# Patient Record
Sex: Female | Born: 1960 | Race: White | Hispanic: No | Marital: Single | Smoking: Never smoker
Health system: Southern US, Community
[De-identification: ages and names within clinical notes are randomized; demographics above are authoritative.]

## PROBLEM LIST (undated history)

## (undated) DIAGNOSIS — N838 Other noninflammatory disorders of ovary, fallopian tube and broad ligament: Secondary | ICD-10-CM

## (undated) DIAGNOSIS — K219 Gastro-esophageal reflux disease without esophagitis: Secondary | ICD-10-CM

## (undated) DIAGNOSIS — Z862 Personal history of diseases of the blood and blood-forming organs and certain disorders involving the immune mechanism: Secondary | ICD-10-CM

## (undated) DIAGNOSIS — H7191 Unspecified cholesteatoma, right ear: Secondary | ICD-10-CM

## (undated) DIAGNOSIS — C801 Malignant (primary) neoplasm, unspecified: Secondary | ICD-10-CM

## (undated) DIAGNOSIS — G8929 Other chronic pain: Secondary | ICD-10-CM

## (undated) DIAGNOSIS — Z974 Presence of external hearing-aid: Secondary | ICD-10-CM

## (undated) DIAGNOSIS — IMO0002 Reserved for concepts with insufficient information to code with codable children: Secondary | ICD-10-CM

## (undated) DIAGNOSIS — K56609 Unspecified intestinal obstruction, unspecified as to partial versus complete obstruction: Secondary | ICD-10-CM

## (undated) DIAGNOSIS — G610 Guillain-Barre syndrome: Secondary | ICD-10-CM

## (undated) DIAGNOSIS — R112 Nausea with vomiting, unspecified: Secondary | ICD-10-CM

## (undated) DIAGNOSIS — M1990 Unspecified osteoarthritis, unspecified site: Secondary | ICD-10-CM

## (undated) DIAGNOSIS — R0789 Other chest pain: Secondary | ICD-10-CM

## (undated) DIAGNOSIS — D693 Immune thrombocytopenic purpura: Secondary | ICD-10-CM

## (undated) DIAGNOSIS — IMO0001 Reserved for inherently not codable concepts without codable children: Secondary | ICD-10-CM

## (undated) DIAGNOSIS — E785 Hyperlipidemia, unspecified: Secondary | ICD-10-CM

## (undated) DIAGNOSIS — R51 Headache: Secondary | ICD-10-CM

## (undated) DIAGNOSIS — Z9889 Other specified postprocedural states: Secondary | ICD-10-CM

## (undated) DIAGNOSIS — R519 Headache, unspecified: Secondary | ICD-10-CM

## (undated) HISTORY — DX: Unspecified osteoarthritis, unspecified site: M19.90

## (undated) HISTORY — DX: Reserved for inherently not codable concepts without codable children: IMO0001

## (undated) HISTORY — DX: Personal history of diseases of the blood and blood-forming organs and certain disorders involving the immune mechanism: Z86.2

## (undated) HISTORY — DX: Guillain-Barre syndrome: G61.0

## (undated) HISTORY — DX: Headache: R51

## (undated) HISTORY — PX: WISDOM TOOTH EXTRACTION: SHX21

## (undated) HISTORY — PX: KNEE ARTHROSCOPY: SUR90

## (undated) HISTORY — DX: Immune thrombocytopenic purpura: D69.3

## (undated) HISTORY — DX: Other chest pain: R07.89

## (undated) HISTORY — DX: Headache, unspecified: R51.9

## (undated) HISTORY — PX: EXTERNAL EAR SURGERY: SHX627

## (undated) HISTORY — DX: Gastro-esophageal reflux disease without esophagitis: K21.9

## (undated) HISTORY — PX: KNEE LIGAMENT RECONSTRUCTION: SHX1895

## (undated) HISTORY — DX: Unspecified intestinal obstruction, unspecified as to partial versus complete obstruction: K56.609

## (undated) HISTORY — DX: Hyperlipidemia, unspecified: E78.5

## (undated) HISTORY — DX: Presence of external hearing-aid: Z97.4

## (undated) HISTORY — DX: Other chronic pain: G89.29

## (undated) HISTORY — DX: Malignant (primary) neoplasm, unspecified: C80.1

## (undated) HISTORY — DX: Reserved for concepts with insufficient information to code with codable children: IMO0002

## (undated) HISTORY — DX: Unspecified cholesteatoma, right ear: H71.91

---
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
## 2002-11-20 ENCOUNTER — Other Ambulatory Visit: Admission: RE | Admit: 2002-11-20 | Discharge: 2002-11-20 | Payer: Self-pay | Admitting: Obstetrics and Gynecology

## 2005-12-02 ENCOUNTER — Ambulatory Visit: Payer: Self-pay | Admitting: Oncology

## 2006-06-03 ENCOUNTER — Ambulatory Visit: Payer: Self-pay | Admitting: Oncology

## 2007-02-06 ENCOUNTER — Ambulatory Visit (HOSPITAL_BASED_OUTPATIENT_CLINIC_OR_DEPARTMENT_OTHER): Admission: RE | Admit: 2007-02-06 | Discharge: 2007-02-06 | Payer: Self-pay | Admitting: Otolaryngology

## 2007-05-26 ENCOUNTER — Ambulatory Visit: Payer: Self-pay | Admitting: Oncology

## 2008-10-10 ENCOUNTER — Ambulatory Visit (HOSPITAL_COMMUNITY): Admission: RE | Admit: 2008-10-10 | Discharge: 2008-10-10 | Payer: Self-pay | Admitting: Gastroenterology

## 2008-12-13 HISTORY — PX: EXTERNAL EAR SURGERY: SHX627

## 2008-12-13 HISTORY — PX: CHOLECYSTECTOMY: SHX55

## 2008-12-20 ENCOUNTER — Ambulatory Visit (HOSPITAL_COMMUNITY): Admission: RE | Admit: 2008-12-20 | Discharge: 2008-12-20 | Payer: Self-pay | Admitting: Otolaryngology

## 2009-01-06 ENCOUNTER — Ambulatory Visit (HOSPITAL_COMMUNITY): Admission: RE | Admit: 2009-01-06 | Discharge: 2009-01-06 | Payer: Self-pay | Admitting: Otolaryngology

## 2009-01-22 ENCOUNTER — Encounter (INDEPENDENT_AMBULATORY_CARE_PROVIDER_SITE_OTHER): Payer: Self-pay | Admitting: Otolaryngology

## 2009-01-22 ENCOUNTER — Ambulatory Visit (HOSPITAL_COMMUNITY): Admission: RE | Admit: 2009-01-22 | Discharge: 2009-01-23 | Payer: Self-pay | Admitting: Otolaryngology

## 2009-02-18 ENCOUNTER — Emergency Department (HOSPITAL_COMMUNITY): Admission: EM | Admit: 2009-02-18 | Discharge: 2009-02-19 | Payer: Self-pay | Admitting: Emergency Medicine

## 2009-04-14 ENCOUNTER — Ambulatory Visit: Payer: Self-pay | Admitting: Oncology

## 2009-04-23 ENCOUNTER — Ambulatory Visit (HOSPITAL_BASED_OUTPATIENT_CLINIC_OR_DEPARTMENT_OTHER): Admission: RE | Admit: 2009-04-23 | Discharge: 2009-04-23 | Payer: Self-pay | Admitting: Otolaryngology

## 2009-04-24 LAB — CBC WITH DIFFERENTIAL/PLATELET
BASO%: 0.7 % (ref 0.0–2.0)
Eosinophils Absolute: 0.1 10*3/uL (ref 0.0–0.5)
LYMPH%: 29.1 % (ref 14.0–49.7)
MCHC: 33.8 g/dL (ref 31.5–36.0)
MONO#: 0.3 10*3/uL (ref 0.1–0.9)
MONO%: 5.3 % (ref 0.0–14.0)
NEUT#: 3.6 10*3/uL (ref 1.5–6.5)
RBC: 4.03 10*6/uL (ref 3.70–5.45)
RDW: 13.1 % (ref 11.2–14.5)
WBC: 5.6 10*3/uL (ref 3.9–10.3)
nRBC: 0 % (ref 0–0)

## 2009-06-02 ENCOUNTER — Encounter (INDEPENDENT_AMBULATORY_CARE_PROVIDER_SITE_OTHER): Payer: Self-pay | Admitting: General Surgery

## 2009-06-03 ENCOUNTER — Inpatient Hospital Stay (HOSPITAL_COMMUNITY): Admission: RE | Admit: 2009-06-03 | Discharge: 2009-06-05 | Payer: Self-pay | Admitting: General Surgery

## 2009-10-21 ENCOUNTER — Ambulatory Visit: Payer: Self-pay | Admitting: Oncology

## 2009-11-18 ENCOUNTER — Emergency Department (HOSPITAL_COMMUNITY): Admission: EM | Admit: 2009-11-18 | Discharge: 2009-11-18 | Payer: Self-pay | Admitting: Emergency Medicine

## 2009-12-26 ENCOUNTER — Ambulatory Visit (HOSPITAL_COMMUNITY): Admission: RE | Admit: 2009-12-26 | Discharge: 2009-12-26 | Payer: Self-pay | Admitting: Obstetrics and Gynecology

## 2010-10-09 ENCOUNTER — Ambulatory Visit (HOSPITAL_COMMUNITY): Admission: RE | Admit: 2010-10-09 | Discharge: 2010-10-09 | Payer: Self-pay | Admitting: Otolaryngology

## 2010-11-13 ENCOUNTER — Emergency Department (HOSPITAL_COMMUNITY)
Admission: EM | Admit: 2010-11-13 | Discharge: 2010-11-13 | Payer: Self-pay | Source: Home / Self Care | Admitting: Emergency Medicine

## 2010-12-31 ENCOUNTER — Other Ambulatory Visit (HOSPITAL_COMMUNITY): Payer: Self-pay | Admitting: Obstetrics and Gynecology

## 2010-12-31 DIAGNOSIS — Z0000 Encounter for general adult medical examination without abnormal findings: Secondary | ICD-10-CM

## 2010-12-31 DIAGNOSIS — Z1231 Encounter for screening mammogram for malignant neoplasm of breast: Secondary | ICD-10-CM

## 2011-01-03 ENCOUNTER — Encounter: Payer: Self-pay | Admitting: Otolaryngology

## 2011-02-05 ENCOUNTER — Ambulatory Visit (HOSPITAL_COMMUNITY)
Admission: RE | Admit: 2011-02-05 | Discharge: 2011-02-05 | Disposition: A | Discharge status: Home / Self Care | Payer: 59 | Source: Ambulatory Visit | Attending: Obstetrics and Gynecology | Admitting: Obstetrics and Gynecology

## 2011-02-05 DIAGNOSIS — Z1231 Encounter for screening mammogram for malignant neoplasm of breast: Secondary | ICD-10-CM | POA: Insufficient documentation

## 2011-02-23 LAB — BASIC METABOLIC PANEL
CO2: 25 mEq/L (ref 19–32)
Calcium: 9.3 mg/dL (ref 8.4–10.5)
GFR calc Af Amer: >60 mL/min (ref >60)
GFR calc non Af Amer: >60 mL/min (ref >60)
Potassium: 4.1 mEq/L (ref 3.5–5.1)
Sodium: 146 mEq/L — ABNORMAL HIGH (ref 135–145)

## 2011-02-23 LAB — DIFFERENTIAL
Basophils Absolute: 0 10*3/uL (ref 0.0–0.1)
Basophils Relative: 0 % (ref 0–1)
Eosinophils Absolute: 0 10*3/uL (ref 0.0–0.7)
Lymphocytes Relative: 15 % (ref 12–46)
Monocytes Relative: 1 % — ABNORMAL LOW (ref 3–12)
Neutrophils Relative %: 84 % — ABNORMAL HIGH (ref 43–77)

## 2011-02-23 LAB — CBC
HCT: 37.8 % (ref 36.0–46.0)
Hemoglobin: 13.3 g/dL (ref 12.0–15.0)
MCHC: 35.2 g/dL (ref 30.0–36.0)
RBC: 4.17 MIL/uL (ref 3.87–5.11)
WBC: 5.4 10*3/uL (ref 4.0–10.5)

## 2011-03-22 LAB — CBC
HCT: 30 % — ABNORMAL LOW (ref 36.0–46.0)
HCT: 31.2 % — ABNORMAL LOW (ref 36.0–46.0)
HCT: 39.1 % (ref 36.0–46.0)
Hemoglobin: 10.4 g/dL — ABNORMAL LOW (ref 12.0–15.0)
Hemoglobin: 13.5 g/dL (ref 12.0–15.0)
MCHC: 33.5 g/dL (ref 30.0–36.0)
MCHC: 33.7 g/dL (ref 30.0–36.0)
MCV: 96.6 fL (ref 78.0–100.0)
MCV: 96.8 fL (ref 78.0–100.0)
Platelets: 71 10*3/uL — ABNORMAL LOW (ref 150–400)
RBC: 3.31 MIL/uL — ABNORMAL LOW (ref 3.87–5.11)
RBC: 4.14 MIL/uL (ref 3.87–5.11)
RDW: 13.2 % (ref 11.5–15.5)
RDW: 13.4 % (ref 11.5–15.5)
RDW: 13.5 % (ref 11.5–15.5)
WBC: 4.3 10*3/uL (ref 4.0–10.5)

## 2011-03-22 LAB — COMPREHENSIVE METABOLIC PANEL
ALT: 162 U/L — ABNORMAL HIGH (ref 0–35)
AST: 176 U/L — ABNORMAL HIGH (ref 0–37)
Albumin: 3 g/dL — ABNORMAL LOW (ref 3.5–5.2)
Alkaline Phosphatase: 36 U/L — ABNORMAL LOW (ref 39–117)
Alkaline Phosphatase: 38 U/L — ABNORMAL LOW (ref 39–117)
Alkaline Phosphatase: 43 U/L (ref 39–117)
BUN: 1 mg/dL — ABNORMAL LOW (ref 6–23)
BUN: 8 mg/dL (ref 6–23)
CO2: 29 mEq/L (ref 19–32)
Chloride: 103 mEq/L (ref 96–112)
Chloride: 106 mEq/L (ref 96–112)
GFR calc non Af Amer: >60 mL/min (ref >60)
Glucose, Bld: 107 mg/dL — ABNORMAL HIGH (ref 70–99)
Glucose, Bld: 112 mg/dL — ABNORMAL HIGH (ref 70–99)
Glucose, Bld: 96 mg/dL (ref 70–99)
Potassium: 3.6 mEq/L (ref 3.5–5.1)
Potassium: 4.1 mEq/L (ref 3.5–5.1)
Potassium: 4.6 mEq/L (ref 3.5–5.1)
Sodium: 135 mEq/L (ref 135–145)
Total Bilirubin: 0.5 mg/dL (ref 0.3–1.2)
Total Bilirubin: 0.7 mg/dL (ref 0.3–1.2)
Total Protein: 5.2 g/dL — ABNORMAL LOW (ref 6.0–8.3)

## 2011-03-22 LAB — DIFFERENTIAL
Basophils Absolute: 0 10*3/uL (ref 0.0–0.1)
Eosinophils Absolute: 0.1 10*3/uL (ref 0.0–0.7)
Monocytes Absolute: 0.3 10*3/uL (ref 0.1–1.0)
Neutrophils Relative %: 47 % (ref 43–77)

## 2011-03-22 LAB — HEPATIC FUNCTION PANEL
ALT: 92 U/L — ABNORMAL HIGH (ref 0–35)
Bilirubin, Direct: 0.1 mg/dL (ref 0.0–0.3)
Indirect Bilirubin: 0.4 mg/dL (ref 0.3–0.9)

## 2011-03-23 LAB — POCT HEMOGLOBIN-HEMACUE: Hemoglobin: 12.3 g/dL (ref 12.0–15.0)

## 2011-03-25 LAB — URINALYSIS, ROUTINE W REFLEX MICROSCOPIC
Bilirubin Urine: NEGATIVE
Ketones, ur: NEGATIVE mg/dL
Nitrite: NEGATIVE
Urobilinogen, UA: 1 mg/dL (ref 0.0–1.0)

## 2011-03-25 LAB — POCT PREGNANCY, URINE: Preg Test, Ur: NEGATIVE

## 2011-03-25 LAB — CBC
HCT: 33.8 % — ABNORMAL LOW (ref 36.0–46.0)
MCHC: 34.4 g/dL (ref 30.0–36.0)
MCV: 95 fL (ref 78.0–100.0)
Platelets: 83 10*3/uL — ABNORMAL LOW (ref 150–400)
RBC: 3.56 MIL/uL — ABNORMAL LOW (ref 3.87–5.11)

## 2011-03-25 LAB — DIFFERENTIAL
Basophils Absolute: 0 10*3/uL (ref 0.0–0.1)
Eosinophils Relative: 1 % (ref 0–5)
Lymphocytes Relative: 37 % (ref 12–46)
Lymphs Abs: 1.8 10*3/uL (ref 0.7–4.0)
Neutro Abs: 2.7 10*3/uL (ref 1.7–7.7)

## 2011-03-25 LAB — COMPREHENSIVE METABOLIC PANEL
AST: 15 U/L (ref 0–37)
BUN: 12 mg/dL (ref 6–23)
CO2: 23 mEq/L (ref 19–32)
Calcium: 8.5 mg/dL (ref 8.4–10.5)
Creatinine, Ser: 0.71 mg/dL (ref 0.4–1.2)
GFR calc Af Amer: >60 mL/min (ref >60)
GFR calc non Af Amer: >60 mL/min (ref >60)

## 2011-03-25 LAB — LIPASE, BLOOD: Lipase: 18 U/L (ref 11–59)

## 2011-03-30 LAB — CBC
HCT: 36.1 % (ref 36.0–46.0)
Hemoglobin: 12.3 g/dL (ref 12.0–15.0)
RBC: 3.81 MIL/uL — ABNORMAL LOW (ref 3.87–5.11)
RDW: 13.1 % (ref 11.5–15.5)
WBC: 4.7 10*3/uL (ref 4.0–10.5)

## 2011-03-30 LAB — DIFFERENTIAL
Eosinophils Relative: 2 % (ref 0–5)
Lymphocytes Relative: 46 % (ref 12–46)
Lymphs Abs: 2.2 10*3/uL (ref 0.7–4.0)
Monocytes Absolute: 0.3 10*3/uL (ref 0.1–1.0)
Monocytes Relative: 7 % (ref 3–12)
Neutro Abs: 2.1 10*3/uL (ref 1.7–7.7)

## 2011-03-30 LAB — ABO/RH: ABO/RH(D): A POS

## 2011-03-30 LAB — PROTIME-INR: INR: 0.9 (ref 0.00–1.49)

## 2011-03-30 LAB — APTT: aPTT: 29 seconds (ref 24–37)

## 2011-04-27 NOTE — Op Note (Signed)
NAMESOPHIAMARIE, Brady              ACCOUNT NO.:  [id number]   MEDICAL RECORD NO.:  [id number]          PATIENT TYPE:  OIB   LOCATION:  5155                         FACILITY:  [facility]   PHYSICIAN:  Carolan Shiver, M.D.    DATE OF BIRTH:  02/05/1961   DATE OF PROCEDURE:  DATE OF DISCHARGE:                               OPERATIVE REPORT   JUSTIFICATION FOR PROCEDURE:  Christine Brady is a 50 year old white  female here today for a right canal up tympanomastoidectomy to excise a  right attic cholesteatoma.  Christine Brady was first seen by me on  January 23, 2007.  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  She was placed on Sterapred and underwent revision BMTs with modified  Peggye Pitt T-tubes on February 09, 2007.  She was found to have large  retraction pocket with debris posterosuperiorly and a shallow attic  retraction pocket also with some debris.  At that time, she did not have  any frank cholesteatoma.  By December 02, 2008, she was found to have  attic debris which was felt to be an attic cholesteatoma.  At that time,  she was having bilateral ear pain and decreased hearing.  She was placed  on Cipro HC and Darvocet and return in 10-14 days for followup.  At that  time, I was convinced that she had a right attic cholesteatoma.  CT scan  of her temporal bones was ordered and was consistent with attic  cholesteatoma.   The patient was counseled that she would require a right canal up  tympanomastoidectomy, possible modified radical mastoidectomy.  She has  a known history of idiopathic thrombocytopenic purpura.  I had her  evaluated by her family doctor, Dr. Celine Mans.  Chest x-ray was  within normal limits.  An EKG showed sinus bradycardia, and preoperative  lab work showed initial platelet count of 91,000, repeat platelet  count  was 108,000 on January 20, 2009.  She was scheduled for a 6 packs of  platelet transfusion on the morning of the procedure.  She was  recommended for right canal up tympanomastoidectomy possible, modified  radical mastoidectomy right ear, [facility] OR, January 22, 2009, at  8:30 in the morning under general endotracheal anesthesia.   Risks, complications, and alternatives were explained to her.  Questions  were invited and answered, and informed consent was signed and  witnessed.  Preop audiometric testing on January 20, 2009, documented 40  dB conductive hearing loss with an SRT of 40 dB and 92% discrimination.  She had normal hearing in the left ear.   Justification for outpatient setting is patient's age and need for  general endotracheal anesthesia.   Justification for overnight stay is 23 hours of observation of facial  function status post right canal up tympanomastoidectomy.   PREOPERATIVE DIAGNOSIS:  Right attic cholesteatoma.   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   OPERATION:  Right canal  up tympanomastoidectomy with type 1 medial  fascia graft tympanoplasty, composite cartilage perichondrial autograft,  repair of a large scutal defect, and UMT with Paparella type I tube.   SURGEON:  Carolan Shiver, MD   ANESTHESIA:  General endotracheal, [place], MD   COMPLICATIONS:  None.   SUMMARY OF REPORT:  After the patient was taken to the operating room,  she was placed in the supine position.  An IV had been begun in the  holding area.  General IV induction was then performed.  Again, she had  received 6 packs of platelets in the Short-Stay Unit early this morning.  After induction, the patient was orally intubated without difficulty.  Eyelids were taped shut.  She was properly positioned and monitored.  Elbows and ankles padded with foam rubber, and a Foley catheter was  inserted.  A time-out was  performed.   Small amount of hair was clipped in the right postauricular area.  Her  hair was taped, and stocking cap was applied.  Her right postauricular  area was infiltrated with 3 mL of 1% Xylocaine with 1:100,000  epinephrine.  Silver wire needle and electrodes were then inserted into  the right orbicularis oris and orbicularis oculi muscles and the  suprasternal notch and connected to a NIM facial nerve monitor  preamplifier.   The patient's right ear and hemiface were then prepped with Betadine and  draped in a standard fashion for a tympanomastoidectomy.   Examination of the right ear canal revealed a 5-mm diameter canal.  There was a large attic cholesteatoma and a patent tube with a 50%  inferior perforation.  The ear was filled with debris.  The canal and  tympanic membrane were cleaned.  Four-quadrant ear canal blocks were  performed with 1 mL of 2% Xylocaine with 1:50,000 epinephrine.   A right postauricular incision was marked and tattooed.  An incision was  made and carried down through skin and subcutaneous tissue.  A T-shaped  incision was made in the mastoid periosteum.  The anterior and posterior  subperiosteal flaps were elevated.  The mastoid cortex was exposed.  The  mastoid was then entered using cutting burs using continuous suction  irrigation.  The mastoid was found to be sclerotic.  Sigmoid sinus was  identified and followed to the digastric tip and to the digastric ridge.  Digastric periosteum was identified.  Complete mastoidectomy was  performed.  The sigmoid sinus was anteriorly displaced.  Posterior canal  wall was thin.  There were very few cells in the mastoid proper.  It was  quite sclerotic.  The antrum was opened and followed anteriorly to the  attic.  Attic cholesteatoma was encountered in the aditus ad antrum.  Bone lateral to the ossicles was then removed with diamond burs.  The  posterior canal wall was thinned.  The scutum was completely  absent.  At  this point, I was able to deliver the cholesteatoma out from the attic  retraction pocket.  It was a fragmented cholesteatoma not well  encapsulated.  The disease was engulfing the head of the malleus and the  body of the incus.  The superior aspect of the body of the incus was  eroded, as was the short process.  The cholesteatoma was dissected from  the lateral surface of the ossicles and toward the middle ear.  The  facial recess was then opened, and the incudal buttress was removed in  order to remove all of the disease.  The medial superior canal wall was  cleaned with sinus tympani excavators.  The dome of the lateral  semicircular canal was intact.  The facial nerve was not exposed during  the facial recess portion of the procedure.  The cavity was then  copiously irrigated with bacitracin-containing saline, and bone wax was  used to control bone bleeding which was a slow ooze throughout the  procedure due to her thrombocytopenia.   The posterior canal wall skin was then dissected medially to the  annulus.  Again, the scutum was completely absent.  Middle ear was  entered posterosuperiorly after an incision was made in the post canal  wall skin between 6 and 12 o'clock for the 15 blade.  Examination of the  middle ear revealed that the chorda tympani nerve was coursing directly  through the cholesteatoma, and therefore the nerve was sacrificed.  Cholesteatoma was dissected from the long process of the incus and from  the stapes superstructure including the area of the oval window between  the facial canal and the superior aspect of the stapes superstructure.  It was also dissected off the stapedial tendon and from the sinus  tympani.  Some posterosuperior bony canal wall was curetted in order to  facilitate the excision.  Middle ear mucosa was very thickened and there  was a thin layer of squame covering the mucosa.  Therefore, the mucosa  was removed.  The epithelial  margin of the inferior perforation was then  excised with a 59-10 beaver blade.  Portion of the tympanic membrane was  myringosclerotic.   Careful examination of the ossicles revealed the malleus was intact and  mobile.  The incus was eroded as I mentioned before, but the long  process was intact as was the lenticular process.  Stapes superstructure  was intact and mobile.  The facial nerve was covered by bone in a  horizontal portion and stimulated at 0.7 mA at the termination of the  procedure.  The nerve was not exposed.  The ear was then copiously  irrigated with bacitracin-containing saline.   The temporalis fascia graft was then harvested and pressed between  tongue blades and dried.  A slit was made in the center of the graft to  receive a Paparella type I tube as there was no other place in the  residual tympanic membrane for a tube.   A 1-cm incision was made in the medial surface of the right tragus after  the area was infiltrated with 1 mL of 1% Xylocaine with 1:100,000  epinephrine.  A composite cartilage perichondrial autograft was  harvested.  The perichondrium was removed from one side of the  cartilage.  The cartilage was trimmed to fit the scutal defect.  The  CCPA graft was then placed in the ear with cartilage filling the scutal  defect and a cuff of perichondrium surrounding the defect.  The inferior  tongue of perichondrium was draped down over the short process of the  malleus handle to completely close the scutal defect.   The temporalis fascia graft which had been previously dried and trimmed  was then placed medial to the tympanic membrane remnant.  Anterior  portion of the graft was brought out through the perforation.  The  middle ear was packed with Gelfoam disks soaked in Cipro HC.  The graft  was then rotated 180 degrees anteriorly and tucked for 306 degrees  around the circumference of the perforation.  The Paparella type I tube  was placed  through the  graft.  Posterior mesotympanum was then packed  with Gelfoam disks soaked in Cipro HC.  The atticotomy flap was then  returned to the anatomic position covering the perichondrium of the CCPA  graft as well as the temporalis fascia graft.  The medial canal was then  packed with Gelfoam disks soaked in Cipro HC.  Lateral canal was packed  with quarter-inch Iodoform Nu-Gauze packing impregnated with bacitracin  ointment.  A sliver of Penrose drain was placed in the mastoid cavity.  It was sutured in place with interrupted 3-0 Monocryl.  Postauricular  incision was then closed in 3 layers using interrupted inverted 3-0  Monocryl for mucoperiosteal layer and for subcutaneous layer.  Prior to  closing the subcutaneous layer, the site was copiously irrigated again  with bacitracin-containing saline.  Skin was closed with a running  locking 5-0 Ethilon.  Bacitracin ointment was applied.  The ear was  padded with Telfa and cotton and a standard adult Glasscock mastoid  dressing was applied loosely in the standard fashion.  The patient's  Foley catheter was removed.  She was awakened, extubated, and  transferred to hospital bed.  She appeared to tolerate the general  endotracheal anesthesia and the procedures well and left the operating  room in stable condition.   TOTAL FLUIDS:  3 L.   TOTAL URINE OUTPUT:  2 L.   ESTIMATED BLOOD LOSS:  50 mL.   The patient received Ancef 1 g IV, Zofran 4 mg IV at the beginning and  end of the procedure and Decadron 10 mg IV.   Again, the patient received 6 packs of platelets prior to the procedure.  She had a constant ooze from the bone and did have some minor clotting  during the procedure.   SUMMARY:  The patient was found to have a right attic cholesteatoma of  the fragmented type.  It was covering the head of the malleus and the  lateral body of the incus.  There was complete erosion of the scutum.  There was disease lateral to the ossicles, but it  also extended into the  sinus tympani and it crept along the mucosa in the middle ear.  It was  attached to the long process of the incus as well as the stapes  superstructure and the stapedial tendon.  There was partial erosion of  the body and the short process of the incus.  Otherwise, the ossicles  were intact and mobile.  They had been pushed somewhat medially due to  the large attic cholesteatoma.  Facial nerve was intact and stimulated  at 0.7 mA.  The facial recess was opened.  The chorda tympani was  sacrificed as it was coursing through the cholesteatoma.  She has a 60%  inferior perforation which was medial fascia graft and composite  cartilage perichondrial autograft harvested from the right tragus was  used to graft the scutum.  Middle ear mucosa was removed.  She had a  sclerotic mastoid.  Eustachian tube patency was not tested.  There were  no complications.  No prostheses were inserted.           ______________________________  Carolan Shiver, M.D.     EMK/MEDQ  D:  01/22/2009  T:  01/23/2009  Job:  [id number]   cc:   Celine Mans

## 2011-04-27 NOTE — Discharge Summary (Signed)
Christine Brady, Christine Brady              ACCOUNT NO.:  [id number]   MEDICAL RECORD NO.:  [id number]          PATIENT TYPE:  OIB   LOCATION:  5155                         FACILITY:  [facility]   PHYSICIAN:  Carolan Shiver, M.D.    DATE OF BIRTH:  [birth year]   DATE OF ADMISSION:  01/22/2009  DATE OF DISCHARGE:  01/23/2009                               DISCHARGE SUMMARY   ADMISSION DIAGNOSES:  1. Right attic cholesteatoma with moderate conductive hearing loss,      right ear.  2. Idiopathic thrombocytopenic purpura.  3. Gastroesophageal reflux.  4. History of sinus bradycardia.   DISCHARGE DIAGNOSES:  1. Right attic cholesteatoma with moderate conductive hearing loss,      right ear.  2. Idiopathic thrombocytopenic purpura.  3. Gastroesophageal reflux.  4. History of sinus bradycardia.  5. Right attic and middle ear cholesteatoma.   OPERATION:  Right canal-up tympanomastoidectomy with type 1  tympanoplasty and composite cartilage perichondrial autograft  reconstruction of the right scutum along with Paparella type 1 tube  placement, and microdissection using the OR microscope.   SURGEON:  Carolan Shiver, MD   ANESTHESIA:  General endotracheal by Dr. Laverle Hobby.   COMPLICATIONS:  None.   DISCHARGE STATUS:  Stable.   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  She had had a long history of chronic ear disease and  underwent BMTs in the past for adhesive otitis media.  Over the last  year, she developed a right attic cholesteatoma documented on CT scan.  Preoperative evaluation because of idiopathic thrombocytopenic purpura  revealed that she had sinus bradycardia, a platelet count of 91,000,  which increased to 108,000 by January 20, 2009, unremarkable chest x-ray  and sinus bradycardia on EKG.  She was  evaluated by her family  physician, Dr. Celine Mans, prior to the procedure.   On physical examination, she had a right attic cholesteatoma with a 60%  inferior perforation through which was a modified Richards T-tube.  She  had intact facial function.  Preop audiometric testing documented 40 dB  conductive hearing loss in the right ear with normal discrimination.  She had normal hearing in the left ear.   On the morning of January 22, 2009, the patient was taken to [facility]  Operating Room #3 and underwent an uncomplicated right canal-up  tympanomastoidectomy.  She was found to have attic and middle ear  cholesteatoma.  She had complete revision of her scutum.  The disease  was lateral to the ossicles.  It was filling the attic and it had eroded  the short process of the incus and the portion of the body of the incus  superiorly.  It extended down the long the process of the incus and  involved the superior aspect of stapes superstructure.  It was sitting  on the horizontal portion of the facial canal, but had not eroded the  canal.  It extended into the  right sinus tympani and then had crept in  one layer across the mucosa of the middle ear into the hypotympanum.  During the procedure, the disease was removed from both the mastoid and  transcanal approach.  The facial recess was opened in order to  facilitate removal.  The ossicles were intact and mobile after removal.  Great care was taken to clean the medial surface of the canal wall.  Middle ear mucosa was removed because of the superficial squamous layer  that had crept across the mucosa.  Reconstruction was performed with a  composite cartilage perichondrial  autograft harvested from the right  tragus.  This was used to reconstruct the scutal defect.  A temporalis  fascia graft was used to graft the 70% inferior perforation.  A  Paparella type 1 tube was placed in the graft as there was no other  place in tympanic membrane  for a tube placement.  Again, the ossicles  did not need to be reconstructed.  Prosthesis and cement were not used.  The chorda tympani nerve was sacrificed during the procedure as it was  engulfed by the cholesteatoma.   The patient was recovered in the PACU without complications.  She  complained of some pain.  Throughout the day of January 22, 2009, she  had some intermittent nausea and vomited x3.  This was controlled with  antiemetics.  She was continued on IV hydration through the evening.  Her vital signs stabilized.  By the morning of January 23, 2009, she  was awake, complaining of some mild pain.  The right postauricular drain  was removed.  The incision was healing without signs of hematoma.  I  should mention that the patient received 6 pack of platelets in the  morning of the procedure prior to going to the OR.  She had some chronic  low grade oozing during the procedure, bone oozed but it was not  remarkable.  Again, in the morning of  January 23, 2009, she had intact  facial function and no nystagmus.  Her right ear canal packing was in  place.   Her friend, Rick Duff, was in the room with the patient.  Both the  patient and Fleet Contras were given postoperative instructions.  Cleaning,  keeping her head elevated at 30 degrees, following a soft to regular  diet, avoiding water exposure to the right ear for the next 6 weeks, and  returning to my office on January 29, 2009, at 2:50 p.m. for followup  and suture removal.   DISCHARGE MEDICATIONS:  Include the following:  1. Cefzil 500 mg p.o. b.i.d. x10 days, #20.  2. Percocet 5/325 one to two p.o. q.4 h. p.r.n. pain, #30.  3. Phenergan suppositories 25 mg PR q.6 h. p.r.n. nausea, #2.  4. Cipro HC drops 3 drops, b.i.d. x7 days.   Again, she is not to return to work for the next 10 days.  She is to  follow a regular diet and keep her head elevated.  She is to avoid water  exposure, nose blowing, or sneezing without her  mouth open.  She is to  call [phone] for any postoperative problems directly related to the  procedure.  She was given both verbal and written instructions and  referred to our website at [email or web address] where the  postoperative instructions are also posted.   ADMISSION LABORATORY DATA:  White blood cell count of 4700, hemoglobin  of 12.3, hematocrit 36.1, platelet count of 108,000.  PT of 12.4,  PTT  29, INR 0.9.  Again, these were from January 20, 2009.  EKG showed sinus  bradycardia.  Chest x-ray showed no acute cardiopulmonary abnormalities.  Audiometric testing showed a 40 dB conductive hearing loss in the right  ear with normal discrimination.  CT scanning of her temporal bone has  documented chronic right mastoiditis and the right attic cholesteatoma.   At the time of hospitalization, the patient was in the short-stay unit,  where she received 6 pack of platelets; [place] OR room #3, the PACU,  and then on 5000 room 5127.   Discharge status was stable.           ______________________________  Carolan Shiver, M.D.     EMK/MEDQ  D:  01/23/2009  T:  01/23/2009  Job:  [id number]   cc:   Celine Mans

## 2011-04-27 NOTE — Op Note (Signed)
Christine Brady, Christine Brady              ACCOUNT NO.:  [id number]   MEDICAL RECORD NO.:  [id number]          PATIENT TYPE:  OIB   LOCATION:  0098                         FACILITY:  [facility]   PHYSICIAN:  Adolph Pollack, M.D.DATE OF BIRTH:  [birth year]   DATE OF PROCEDURE:  06/02/2009  DATE OF DISCHARGE:                               OPERATIVE REPORT   PREOPERATIVE DIAGNOSIS:  Symptomatic cholelithiasis (microlithiasis).   POSTOPERATIVE DIAGNOSIS:  Symptomatic cholelithiasis (microlithiasis).   PROCEDURE:  Laparoscopic cholecystectomy with intraoperative  cholangiogram.   SURGEON:  Adolph Pollack, M.D.   ANESTHESIA:  General.   INDICATIONS:  Christine Brady is a 50 year old female who has been having  problems with nausea and vomiting for at least a year and a half.  Recently this past year, she has developed some postprandial right upper  quadrant pressure-type pain consistent with biliary colic.  A GI  cocktail helped with her nausea and vomiting but not with her  postprandial right upper quadrant pain.  She had ultrasound done in  September 2009, demonstrating gallbladder sludge, potentially  microlithiasis.  HIDA scan was normal.  Upper endoscopy was normal.  We  had a long discussion about cholecystectomy.  Following that a fair  number people do get relief but not all and she understands this.  She  now presents for elective cholecystectomy.  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   TECHNIQUE:  She is brought to have room, placed supine on the operating  table and general anesthetic was administered.  The abdominal wall was  sterilely prepped and draped.  Marcaine was infiltrated the subumbilical  region.  A subumbilical incision was made through the skin, subcutaneous  tissue, fascia and peritoneum entering the peritoneal cavity under  direct vision.  A pursestring suture of 0 Vicryl was placed around the  fascial edges.  A Hasson trocar was introduced into the peritoneal  cavity and pneumoperitoneum was created by insufflation of CO2 gas.   Next, the laparoscope was introduced and there is no underlying bleeding  or organ injury.  She is placed in reverse Trendelenburg position with  the right side tilted slightly up.  An 11-mm trocar was placed through  an epigastric incision and two 5 mm trocars placed in the right upper  quadrant.  The fundus of the gallbladder was grasped and no acute  inflammatory changes were noted.  It was retracted toward the right  shoulder.  The infundibulum was grasped and retracted laterally.  Using  careful blunt dissection on the infundibulum, I mobilized infundibulum.  I then was able to identify the cystic duct and create a window around  it.  The critical view was achieved.  I identified the cystic artery and  created a window around it and clipped it.  I then put a clip at the  cystic duct gallbladder junction.  I made a small incision in the cystic  duct and noted efflux of bile.  A cholangiocath was passed into  abdominal wall, placed into the cystic duct and a cholangiograms  performed.   Under real time fluoroscopy, dilute contrast was injected to the cystic  duct.  Common hepatic, right and left hepatic, and common bile ducts  were all filled and contrast drained promptly into the duodenum without  obvious evidence of obstruction.  Final report is pending the  radiologist's interpretation.   The cholangiocatheter was removed, the cystic duct was clipped three  times on the biliary side and divided.  The cystic artery was then  divided.  The gallbladder then dissected free from liver using  electrocautery intact and placed in Endopouch bag.   I then copiously irrigated out the gallbladder fossa and no bleeding was  noted from this.  No bile leak was noted.  The irrigation fluid was  evacuated.   The gallbladder was removed through the  subumbilical incision and the  subumbilical fascial defect was closed under laparoscopic vision by  tightening up and tying down the pursestring suture.  The remaining  trocars were removed and pneumoperitoneum released.  The skin incisions  were closed with 4-0 Monocryl subcuticular stitches followed by Steri-  Strips and sterile dressings.  She tolerated the procedure well without  any apparent complications and was taken to recovery in satisfactory  condition.      Adolph Pollack, M.D.  Electronically Signed     TJR/MEDQ  D:  06/02/2009  T:  06/02/2009  Job:  [id number]   cc:   Anselmo Rod, M.D.  Fax: [phone]   Celine Mans  Fax: [phone]   Lynann Bologna  Fax: [phone]   Leighton Roach. Truett Perna, M.D.  Fax: [phone]

## 2011-04-27 NOTE — H&P (Signed)
NAMETANISE, RUSSMAN              ACCOUNT NO.:  [id number]   MEDICAL RECORD NO.:  [id number]          PATIENT TYPE:  OIB   LOCATION:  5155                         FACILITY:  [facility]   PHYSICIAN:  Carolan Shiver, M.D.    DATE OF BIRTH:  21-Aug-1961   DATE OF ADMISSION:  01/22/2009  DATE OF DISCHARGE:                              HISTORY & PHYSICAL   CHIEF COMPLAINT:  Right ear pain and fullness.   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  She was first seen by me on January 23, 2007, at which time, she had acute otitis media, right ear with  middle ear ventilating disorder and a middle ear effusion.  She also had  a chronic middle ear effusion on the left.  She underwent revision BMTs  with modified Richards T-tube by myself on February 06, 2007.  At that  time, she had atelectatic tympanic membranes with a large retraction  pocket and debris in the right ear.  An early cholesteatoma was  suspected that cannot be documented.  She returned on December 02, 2008,  at this time, she had a frank right attic cholesteatoma.  She had patent  T tubes at that time.  A CT scan of her temporal bones documented the  cholesteatoma.  She had a workup by her family physician, Dr. Celine Mans because of a history of thrombocytopenic and chronic GE reflux.  She was cleared for general anesthetic.  The chest x-ray revealed no  active disease and an EKG showed sinus bradycardia.   The patient was counseled that she would require a right canal  tympanomastoidectomy, possible modified radical mastoidectomy to remove  the disease.  Risks, complications, and alternatives were explained to  her.  Questions were invited and answered and informed consent was  signed and witnessed.   Preop audiometric testing on January 20, 2009, documented 40 dB  conductive hearing loss  in the right ear with a speech reception  threshold of 40 dB and 92% discrimination.  She had normal hearing in  the left ear.   Preoperative laboratory data documented platelet count of 91,000, which  had increased to 108,000, by January 20, 2009.  She was recommended for  a 6-pack of platelet transfusion in the morning prior to the procedure.  The procedure was scheduled for the morning of January 22, 2009, at  [facility] OR at 8:30 a.m. under general endotracheal anesthesia, Dr.  Diamantina Monks, anesthesiologist.   PAST MEDICAL HISTORY:  Serious illnesses include thrombocytopenia.  Operations include both knees had been operated on.   MEDICATIONS:  Darvocet, Tylenol, MiraLax, and Prilosec.   ALLERGIES TO MEDICATIONS:  CODEINE.   FAMILY HISTORY:  Positive for diabetes, bleeding disorders, hearing  loss, and ITP.   SOCIAL HISTORY:  She is single, works as a Water quality scientist at [employer].  Does drink alcohol 1-6-pack of beer per week.  Does not use  tobacco products.   REVIEW  OF SYSTEMS:  Positive for chronic ear disease, GE reflux,  migraine headaches, and ITP.   PHYSICAL EXAMINATION:  VITAL SIGNS:  Stable.  GENERAL:  She is awake, alert, and coherent, spontaneous and logical.  HEENT:  Facial function is intact.  There is no nystagmus.  Pupils  PERLA.  External ears were stable.  Right external canal was filled with  debris.  There was a deep attic retraction pocket with scutal erosion  and a right attic cholesteatoma.  There was debris covering the tympanic  membrane.  She had a patent T-tube with a 60% inferior dry perforation.  She had a patent left T-tube and no attic retraction pocket.  External  and internal nasal exams were within normal limits.  Oral cavity, lips,  tongue, and palate were normal.  NECK:  Negative supple.  No cervical lymphadenopathy or thyromegaly.  CHEST:  Clear.  HEART:  Sinus bradycardia.  ABDOMEN:  Benign.  PELVIC/RECTAL:  Deferred.   EXTREMITIES:  Within normal limits.  NEUROLOGIC:  Physiologic with the exception of a hearing loss in the  right ear.   DIAGNOSTICS:  Audiometric testing:  The patient has 40 dB conductive  hearing loss in right ear with an SRT of 40 dB and 92% discrimination.  She had intact normal hearing in the left ear with an SRT of 15 dB and  96% discrimination.  She did have a very mild high frequency loss in the  left ear, which was not clinically significant.   CT scan of her temporal bones documented disease in the right attic and  mastoid consistent with an attic cholesteatoma.  Chest x-ray showed no  active disease.  EKG showed sinus bradycardia.   ADMISSION LABORATORY DATA:  Documented the following:  Hemoglobin was  12.3, hematocrit 36.1, white blood cell count 4700, platelet count of  108,000.  PT was 12.4, PTT 29, INR 0.9.  Again, the patient received a 6  pack of platelets in the morning of January 22, 2009, in preparation  for the OR.   IMPRESSION:  1. 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  2. Idiopathic thrombocytopenic purpura.  3. Sinus bradycardia.  4. History of gastroesophageal reflux.   RECOMMENDATIONS:  The patient is recommended for a right canal  tympanomastoidectomy, possible modified radical mastoidectomy.  She  understands that she may require reconstruction of her ossicles with  titanium prosthesis and/or glass-ionomeric cement.  Risks,  complications, and alternatives were explained to her, which included  but were not limited to infection, bleeding, reaction to anesthesia,  facial weakness or paralysis, transient or prolonged dizziness,  transient or prolonged tinnitus, transient or prolonged taste  disturbance, partial or total loss of hearing, and need for additional  procedures in the future if the cholesteatoma should recur.  She  understood the risks, complications, and alternatives and elected to  proceed with the  procedure.   She was scheduled for right canal tympanomastoidectomy, possible  modified radical mastoidectomy on the morning of January 22, 2009, 8:30  a.m., [facility] OR, room #3, Dr. Diamantina Monks, anesthesiologist.          ______________________________  Carolan Shiver, M.D.    EMK/MEDQ  D:  01/22/2009  T:  01/23/2009  Job:  [id number]   cc:   Celine Mans

## 2011-04-27 NOTE — Op Note (Signed)
NAMEBROOKLEY, SPITLER              ACCOUNT NO.:  [id number]   MEDICAL RECORD NO.:  [id number]          PATIENT TYPE:  AMB   LOCATION:  DSC                          FACILITY:  [facility]   PHYSICIAN:  Carolan Shiver, M.D.    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  The patient had  previously undergone a right canal tympanomastoidectomy with excision of  right attic cholesteatoma by myself at [facility] on January 22, 2009.  At that time, a large perforation was grafted and a Paparella  type 1 tube was placed through the graft.  Over the next several months,  the patient has too became completely obstructed by crust and debris and  she was complaining of significant right ear pain radiating into her  right neck.  I attempted to have her soften the crust and debris with  drops, but this did not work and because of the patient's  sensitivity  of her right ear, she was recommended for removal of the tube under  general mask anesthesia and replacement with a new tube.  I discussed  placing a Triune tube with the patient.  Risks, complications, and  alternatives were explained to her.  Questions were invited and  answered.  Informed consent was signed and witnessed.  Preop hearing  testing on March 31, 2009, documented 40 dB mixed hearing loss in the  right ear with 96% discrimination.  She had normal hearing in the left  ear.   JUSTIFICATION FOR OUTPATIENT SETTING:  The patient's age and need for  general mask anesthesia.   JUSTIFICATION FOR OVERNIGHT STAY:  Not applicable.   PREOPERATIVE DIAGNOSES:  1. Obstructed right Paparella type 1 tube, status post right canal      tympanomastoidectomy for  excision of attic cholesteatoma, January 22, 2009.  2. History of idiopathic thrombocytopenic purpura.  3. History of sinus bradycardia.  4. History of gastroesophageal reflux.   POSTOPERATIVE DIAGNOSES:  1. Obstructed right Paparella type 1 tube, status post right canal      tympanomastoidectomy for excision of attic cholesteatoma, January 22, 2009.  2. History of idiopathic thrombocytopenic purpura.  3. History of sinus bradycardia.  4. History of gastroesophageal reflux.   OPERATION:  Removal and replacement of right Paparella type 1 tube.   SURGEON:  Carolan Shiver, MD   ANESTHESIA:  General mask, Dr. Jairo Ben.   COMPLICATIONS:  None.   DISCHARGE STATUS:  Stable.   SUMMARY OF REPORT:  After the patient was taken to the operating room,  she was placed in supine position.  She was then anesthetized with  propofol under the guidance of Dr. Jairo Ben.  The patient was  then masked.  She was not intubated.  After being properly positioned  and  monitored, Elbows and ankles were padded with foam rubber and a time-  out was performed.   The patient's right ear canal was then cleaned of cerumen and debris.  The previously placed Paparella type 1 tube was engulfed in crusted  debris filling the entire medial ear canal.  The crust and debris were  dissected away from the canal wall in the tympanic membrane and the  crust along with the tube was removed.  I then discovered that the tube  was sitting on the lateral surface of the tympanic membrane and was  actually not in the middle ear space.  She had a tube of tubular band of  scar tissue from 2 o'clock to 7 o'clock crossing across the midportion  of her malleus handle.  This tubular scar was excised with Bellucci  scissors and the cut edges were cauterized with a small flake of silver  nitrate after bleeding could not be controlled with 1:100,000 adrenaline  cotton pledget.  There was basically no middle  ear space available for  tube.  I performed an inferior radial myringotomy incision at 6 o'clock  and using a day hook, I was able to create a middle ear space for  Paparella type 1 tube.  Tube was then inserted and Ciprodex drops were  insufflated.  The patient was then awakened and transferred to her  hospital bed.  She appeared to tolerate the general mask anesthesia and  the procedure well and left the operating room in stable condition.   Christine Brady will be discharged today as an outpatient.  She will be  instructed to return to my office on May 26, 2009, at 4:20 p.m.   DISCHARGE MEDICATIONS:  Ciprodex drops 3 drops both ears t.i.d. x7 days.  She will also be given a prescription for Percocet 5/325, #30 one to two  p.o. q.4 h p.r.n. pain.   She is to keep her head elevated, avoid aspirin or aspirin products, and  avoid water exposure to right ear.  She is to call [phone] for any  postoperative problems directly related to the procedure.  She will be  given both verbal and written instructions.      Carolan Shiver, M.D.  Electronically Signed     EMK/MEDQ  D:  04/23/2009  T:  04/23/2009  Job:  [id number]

## 2011-04-30 NOTE — Discharge Summary (Signed)
Christine Brady, Christine Brady              ACCOUNT NO.:  [id number]   MEDICAL RECORD NO.:  [id number]          PATIENT TYPE:  INP   LOCATION:  1612                         [facility]   PHYSICIAN:  Adolph Pollack, M.D.DATE OF BIRTH:  Apr 12, 1961   DATE OF ADMISSION:  06/02/2009  DATE OF DISCHARGE:  06/05/2009                               DISCHARGE SUMMARY   DISCHARGE DIAGNOSIS:  1. Chronic cholecystitis.   SECONDARY DIAGNOSES:  1. Idiopathic thrombocytopenic purpura.  2. Migraine headaches.  3. Gastroesophageal reflux.  4. Transient transaminitis.  5. Acute blood loss anemia.  6. Urinary retention.   PROCEDURE:  Laparoscopic cholecystectomy with intraoperative  cholangiogram on 06/02/2009.   REASON FOR ADMISSION:  This is a 50 year old female who had a long  history of problems with nausea and vomiting.  She then began having  pressure-type right upper quadrant pain, made worse by meals, associated  nausea and vomiting.  She had a thorough evaluation.  Ultrasound  demonstrated gallbladder sludge.  Upper endoscopy was normal.  It is  felt that she may have microlithiasis that is causing her symptoms and  now she was admitted for an elective cholecystectomy.   HOSPITAL COURSE:  She underwent an elective cholecystectomy and the  first postoperative day was remarkable for acute urinary retention.  She  was tolerating some liquids.  She had a significant amount of  discomfort.  By the second postoperative day, she is having which she  felt was more pain in the epigastrium and right upper quadrant, with  some nausea and vomiting with the headache.  She was afebrile.  Vital  signs were normal.  She had good urine output.  Her white blood cell  count the evening before was normal at 4300, with a hemoglobin down at  10.3.  Her SGOT and SGPT were somewhat elevated but a total bilirubin  and alk phos were normal.  I subsequently ordered a HIDA scan to rule  out a bile leak and this  was negative.  Empiric ciprofloxacin was  ordered.  Her SGOT and SGPT continued to rise into her second  postoperative day.  She was also having increasing headache which may  have been related to the morphine.  The abdominal pain was improving.  She continued to have the Foley in.  A platelet count did drop to 56,000  but her hemoglobin was stable at 10.7.  I did a CT scan of the abdomen  which  demonstrated bibasilar atelectasis, some free pelvic fluid, but  no evidence of intestinal injury.  Her third postoperative day revealed  her SGOT and SGPT began  decreasing.  She was feeling better overall and  was improved.  Her Foley was removed.  She was able to void and tolerate  a diet, and was discharged.   DISPOSITION:  Discharged to home in satisfactory condition on June 05, 2009.  She is given a discharge instruction sheet as well as Percocet  for pain.   FOLLOWUP:  She will follow up in the office in two to three weeks.      Adolph Pollack, M.D.  Electronically Signed     TJR/MEDQ  D:  06/12/2009  T:  06/12/2009  Job:  [id number]   cc:   Anselmo Rod, M.D.  Fax: [phone]   Celine Mans  Fax: [phone]   Lynann Bologna  Fax: [phone]   Leighton Roach. Truett Perna, M.D.  Fax: [phone]

## 2011-04-30 NOTE — Op Note (Signed)
NAMEPORSCHE, NOGUCHI              ACCOUNT NO.:  [id number]   MEDICAL RECORD NO.:  [id number]          PATIENT TYPE:  AMB   LOCATION:  DSC                          FACILITY:  [facility]   PHYSICIAN:  Carolan Shiver, M.D.    DATE OF BIRTH:  February 25, 1961   DATE OF PROCEDURE:  02/06/2007  DATE OF DISCHARGE:                               OPERATIVE REPORT   JUSTIFICATION FOR PROCEDURE:  Christine Brady is a 50 year old white  female here today for revision bilateral myringotomies and tympanic  modified Richards T tubes AU to treat adhesive otitis media AU.  Ms.  Inlow  first presented on January 23, 2007 with a history of right  tympanic membrane rupture.  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 Royal Hawthorn of [place], [place].  She complained of a 1-week history of sudden right ear  infection and then she perforated her tympanic membrane the next day.  She had noticed fullness and decreased hearing.  She was awakened by  sharp pain in her right ear and spontaneous otorrhea. She had had a long  history of chronic ear disease.  She had undergone BMTs in 2003  somewhere in [place].  Felt that her hearing had improved  immediately.  She denied otorrhea, otalgia, tinnitus or vertigo on the  January 23, 2007 exam.   On examination she was found to have adhesive otitis media AD with the  middle ear ventilating disorder with an effusion and a large crust in  the right attic.  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  She was placed on a Sterapred  Dosepak, modified Valsalva maneuver and was recommended for revision  BMT's with modified Richards T tubes and debridement of the right ear  under general anesthesia.  Risks and complications of the procedures  were explained to her.  Questions were invited and answered and informed  consent was signed and witnessed.   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  Last  platelet count was 116,000.   JUSTIFICATION FOR OUTPATIENT SETTING:  The patient's age, need for  general mask anesthesia.   JUSTIFICATION FOR OVERNIGHT STAY:  Not applicable.   PREOPERATIVE DIAGNOSES:  1. Adhesive otitis media AU.  2. Idiopathic thrombocytopenic purpura.   POSTOPERATIVE DIAGNOSES:  1. Adhesive otitis media AU.  2. Idiopathic thrombocytopenic purpura.  3. Deep right attic retraction pocket.   OPERATION:  1. Revision bilateral myringotomies with transtympanic modified      Richards T tubes.  2. Debridement right ear under general anesthesia.   SURGEON:  Ermalinda Barrios, M.D.   ANESTHESIA:  General mask-Dr. Jairo Ben.   COMPLICATIONS:  None.   DISCHARGE STATUS:  Stable.   SUMMARY OF REPORT:  After the patient was taken to the operating room  she was placed in the supine position.  An IV had been begun in the  holding area.  General IV induction was then performed and the patient  was masked.   The patient's right ear canal was  then cleaned. The patient was properly  positioned and monitored and a time-out was performed.   The patient's right ear canal was then cleaned of cerumen and debris.  There was a large ball of crust in the right attic.  This was removed.  There was a small amount of squamous debris deep in the attic which I  was able to suction evacuate.  The patient had collapse of the posterior  one half of the TM with a myringoincudopexy and the right middle ear  filled with fluid.  There was very little space anteriorly.  An anterior  radial myringotomy incision was made.  Seromucoid fluid was suction  evacuated and a modified Richards T-tube was inserted.  Ciprodex drops  were placed.  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  This pocket will need to be watched. She may require  tympanomastoidectomy in the future.   Attention was then turned to the left ear.  The left ear  canal was  cleaned of cerumen and debris.  There was again collapse of the  posterior one half of the TM which was ballooning laterally under the  positive pressure mask ventilation.  There was no attic retraction  pocket. The anterior radial myringotomy incision was made.  Fluid was  suction evacuated and a modified Richards T-tube was inserted followed  by Ciprodex drops placed.  The patient was then awakened and transferred  to her hospital bed.  She appeared to tolerate the general mask  anesthesia and the procedures well and left the operating room in stable  condition.   TOTAL FLUIDS:  400 mL.   ESTIMATED BLOOD LOSS:  One drop.   The patient received Zofran preop and Decadron 10 mg IV.   Ms. Devos will be discharged today as an  outpatient.  She will be  instructed to return to my office on March 07, 2007 at 4:20 p.m.   DISCHARGE MEDICATIONS:  Will include Ciprodex drops, 3 drops OU t.i.d.  x1 week.  She is to keep her head elevated, avoid aspirin and aspirin  products and follow a regular diet.  She is to avoid water exposure AU.  She is to call [phone] for any postoperative problems related to the  procedures.   SUMMARY:  The patient was found to have a deep attic retraction pocket  with some crust and squamous debris.  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  She had type II myringoincudopexy, in atelectatic  posterior one half of her tympanic membrane and adhesive otitis media  with middle ear effusion and middle ear ventilating  disorder.  Left TM has the atelectasis and some fluid but did not have  an attic retraction pocket.  T tubes were placed AU without  complication.  The patient may require additional surgery in the future  such as a tympanomastoidectomy AD to remove the attic disease if it does  not respond to conservative medical management.           ______________________________  Carolan Shiver, M.D.    EMK/MEDQ  D:  02/06/2007  T:   02/06/2007  Job:  [id number]   cc:   Carolan Shiver, M.D.  Celine Mans

## 2011-09-01 LAB — PREPARE PLATELET PHERESIS

## 2011-10-27 ENCOUNTER — Encounter (INDEPENDENT_AMBULATORY_CARE_PROVIDER_SITE_OTHER): Payer: Self-pay

## 2011-10-28 ENCOUNTER — Encounter (INDEPENDENT_AMBULATORY_CARE_PROVIDER_SITE_OTHER): Payer: Self-pay | Admitting: General Surgery

## 2011-10-28 ENCOUNTER — Ambulatory Visit (INDEPENDENT_AMBULATORY_CARE_PROVIDER_SITE_OTHER): Payer: Commercial Managed Care - PPO | Admitting: General Surgery

## 2011-10-28 VITALS — BP 114/76 | HR 60 | Temp 97.1°F | Resp 20 | Ht 62.5 in | Wt 143.0 lb

## 2011-10-28 DIAGNOSIS — L720 Epidermal cyst: Secondary | ICD-10-CM

## 2011-10-28 DIAGNOSIS — L723 Sebaceous cyst: Secondary | ICD-10-CM

## 2011-10-28 NOTE — Progress Notes (Signed)
Chief Complaint  Patient presents with  . New Evaluation    eval of cyst on center of back     HPI Christine Brady is a 50 y.o. female.   HPI  She is referred by Dr. Juleen China for evaluation of an enlarging cyst on her back. She states it's been present for 5 years. She has drained at twice. She states it is getting larger and causing her more discomfort. No history of infection.  Past Medical History  Diagnosis Date  . Reflux   . Abdominal pain   . Chronic headaches   . Arthritis     difficulty walking  . Hearing aid worn   . History of ITP     Past Surgical History  Procedure Date  . Knee surgery     both knees  . Wisdom tooth extraction   . Cholecystectomy 2010  . External ear surgery 2009/2010    Family History  Problem Relation Age of Onset  . Diabetes Mother   . Asthma Mother   . Diabetes Father   . Hypertension Father   . Kidney disease Father     Social History History  Substance Use Topics  . Smoking status: Never Smoker   . Smokeless tobacco: Never Used  . Alcohol Use: Yes    Allergies  Allergen Reactions  . Claritin-D (Loratadine-Pseudoephedrine)     rash  . Codeine     Rash     Current Outpatient Prescriptions  Medication Sig Dispense Refill  . esomeprazole (NEXIUM) 40 MG capsule Take 40 mg by mouth daily before breakfast.        . polyethylene glycol (MIRALAX / GLYCOLAX) packet Take 17 g by mouth daily.          Review of Systems Review of Systems  Constitutional: Negative.   Musculoskeletal: Positive for back pain (at cyst site) and arthralgias.  Hematological:       Platelet count 107,000 recently    Blood pressure 114/76, pulse 60, temperature 97.1 F (36.2 C), temperature source Temporal, resp. rate 20, height 5' 2.5" (1.588 m), weight 143 lb (64.864 kg).  Physical Exam Physical Exam  Constitutional: She appears well-developed and well-nourished. No distress.  Musculoskeletal:       One cm lesion in subcutaneous tissue of  midback with no drainage and erythema.    Data Reviewed Dr. 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  Platelet count is adequate for a procedure.    Plan    Excision of cyst under local anesthesia.  Procedure and risks ( including but not limited to bleeding, infection, wound problems) explained to her .  She seems to understand and would like to proceed.       [name] 10/28/2011, 1:51 PM

## 2011-10-29 ENCOUNTER — Encounter (INDEPENDENT_AMBULATORY_CARE_PROVIDER_SITE_OTHER): Payer: Commercial Managed Care - PPO | Admitting: General Surgery

## 2011-11-26 ENCOUNTER — Ambulatory Visit (INDEPENDENT_AMBULATORY_CARE_PROVIDER_SITE_OTHER): Payer: Commercial Managed Care - PPO | Admitting: General Surgery

## 2011-11-26 ENCOUNTER — Encounter (INDEPENDENT_AMBULATORY_CARE_PROVIDER_SITE_OTHER): Payer: Self-pay | Admitting: General Surgery

## 2011-11-26 ENCOUNTER — Encounter (INDEPENDENT_AMBULATORY_CARE_PROVIDER_SITE_OTHER): Payer: Self-pay

## 2011-11-26 VITALS — BP 104/64 | HR 45 | Temp 96.8°F | Resp 16 | Ht 63.0 in | Wt 138.0 lb

## 2011-11-26 DIAGNOSIS — L723 Sebaceous cyst: Secondary | ICD-10-CM

## 2011-11-26 DIAGNOSIS — L720 Epidermal cyst: Secondary | ICD-10-CM

## 2011-11-26 MED ORDER — TRAMADOL HCL 50 MG PO TABS: 50.0000 mg | ORAL_TABLET | Freq: Four times a day (QID) | ORAL | Status: DC | PRN

## 2011-11-26 NOTE — Progress Notes (Signed)
Addended by: Milas Hock on: 11/26/2011 03:53 PM   Modules accepted: Orders

## 2011-11-26 NOTE — Patient Instructions (Signed)
Light activities over the weekend then activities as tolerated.  You may shower.  Remove bandage on 12/17 and let steri strips fall off by themselves.  Call for heavy bleeding or wound problems.

## 2011-11-26 NOTE — Progress Notes (Signed)
Christine Brady is here for excision of epidermoid cyst on the back. She states it became painful and she squeezed it and drain some of the material inside a couple of weeks ago.  Procedure: She was placed supine on the procedure table.  The subcutaneous tissue mass was identified and marked. The area was widely sterilely prepped and draped. Local anesthetic (Xylocaine) was infiltrated directly over the mass and deep to it. An elliptical incision was made through the skin down to subcutaneous tissue. The mass was then excised intact measuring about 1 cm. It was placed in formalin and sent to pathology.  Bleeding from the subcutaneous tissue was controlled with direct pressure. The subcutaneous tissues was closed with 4-0 Monocryl. The skin was closed with 4-0 Monocryl subcuticular stitches. Steri-Strips and sterile dressing were applied.  She tolerated the procedure well cutting current complications. She was given discharge instructions and prescription of Ultram for pain. She'll return to the office in 3-4 weeks for wound check.

## 2012-03-06 ENCOUNTER — Other Ambulatory Visit: Payer: Self-pay | Admitting: Obstetrics and Gynecology

## 2012-03-06 DIAGNOSIS — Z1231 Encounter for screening mammogram for malignant neoplasm of breast: Secondary | ICD-10-CM

## 2012-03-30 ENCOUNTER — Ambulatory Visit (HOSPITAL_COMMUNITY): Payer: Commercial Managed Care - PPO

## 2012-04-21 ENCOUNTER — Ambulatory Visit (HOSPITAL_COMMUNITY)
Admission: RE | Admit: 2012-04-21 | Discharge: 2012-04-21 | Disposition: A | Discharge status: Home / Self Care | Payer: 59 | Source: Ambulatory Visit | Attending: Obstetrics and Gynecology | Admitting: Obstetrics and Gynecology

## 2012-04-21 DIAGNOSIS — Z1231 Encounter for screening mammogram for malignant neoplasm of breast: Secondary | ICD-10-CM | POA: Insufficient documentation

## 2012-07-26 ENCOUNTER — Ambulatory Visit: Payer: Self-pay | Admitting: Obstetrics and Gynecology

## 2012-09-13 ENCOUNTER — Ambulatory Visit (HOSPITAL_COMMUNITY): Payer: 59 | Attending: Family Medicine

## 2012-09-25 ENCOUNTER — Other Ambulatory Visit (HOSPITAL_COMMUNITY): Payer: 59

## 2012-09-25 ENCOUNTER — Ambulatory Visit (HOSPITAL_COMMUNITY)
Admission: RE | Admit: 2012-09-25 | Discharge: 2012-09-25 | Disposition: A | Discharge status: Home / Self Care | Payer: 59 | Source: Ambulatory Visit | Attending: Family Medicine | Admitting: Family Medicine

## 2012-09-25 DIAGNOSIS — I6529 Occlusion and stenosis of unspecified carotid artery: Secondary | ICD-10-CM | POA: Insufficient documentation

## 2012-09-25 NOTE — Progress Notes (Signed)
VASCULAR LAB PRELIMINARY  PRELIMINARY  PRELIMINARY  PRELIMINARY  Carotid duplex completed.    Preliminary report:  Bilateral:  No evidence of hemodynamically significant internal carotid artery stenosis.   Vertebral artery flow is antegrade.     Christine Brady, RVS 09/25/2012, 9:59 AM

## 2012-11-05 ENCOUNTER — Emergency Department (HOSPITAL_COMMUNITY)
Admission: EM | Admit: 2012-11-05 | Discharge: 2012-11-05 | Disposition: A | Discharge status: Home / Self Care | Payer: 59 | Attending: Emergency Medicine | Admitting: Emergency Medicine

## 2012-11-05 DIAGNOSIS — M5380 Other specified dorsopathies, site unspecified: Secondary | ICD-10-CM | POA: Insufficient documentation

## 2012-11-05 DIAGNOSIS — K219 Gastro-esophageal reflux disease without esophagitis: Secondary | ICD-10-CM | POA: Insufficient documentation

## 2012-11-05 DIAGNOSIS — Z8669 Personal history of other diseases of the nervous system and sense organs: Secondary | ICD-10-CM | POA: Insufficient documentation

## 2012-11-05 DIAGNOSIS — M545 Low back pain, unspecified: Secondary | ICD-10-CM | POA: Insufficient documentation

## 2012-11-05 DIAGNOSIS — Z79899 Other long term (current) drug therapy: Secondary | ICD-10-CM | POA: Insufficient documentation

## 2012-11-05 DIAGNOSIS — M62830 Muscle spasm of back: Secondary | ICD-10-CM

## 2012-11-05 DIAGNOSIS — Z8739 Personal history of other diseases of the musculoskeletal system and connective tissue: Secondary | ICD-10-CM | POA: Insufficient documentation

## 2012-11-05 MED ORDER — LIDOCAINE 5 % EX PTCH: 1.0000 | MEDICATED_PATCH | CUTANEOUS | Status: DC

## 2012-11-05 MED ORDER — TRAMADOL HCL 50 MG PO TABS: 50.0000 mg | ORAL_TABLET | Freq: Four times a day (QID) | ORAL | Status: DC | PRN

## 2012-11-05 MED ORDER — KETOROLAC TROMETHAMINE 30 MG/ML IJ SOLN
60.0000 mg | Freq: Once | INTRAMUSCULAR | Status: AC
  Administered 2012-11-05: 60 mg via INTRAMUSCULAR
  Filled 2012-11-05 (×2): qty 1

## 2012-11-05 MED ORDER — DIAZEPAM 5 MG PO TABS
5.0000 mg | ORAL_TABLET | Freq: Once | ORAL | Status: AC
  Administered 2012-11-05: 5 mg via ORAL
  Filled 2012-11-05: qty 1

## 2012-11-05 MED ORDER — DIAZEPAM 5 MG PO TABS: 5.0000 mg | ORAL_TABLET | Freq: Four times a day (QID) | ORAL | Status: DC | PRN

## 2012-11-05 NOTE — ED Notes (Signed)
Per pt, hurt her back Friday hauling wood.  Felt back pull.  Has had back pain since.  Has previous hx of back injury.

## 2012-11-05 NOTE — ED Provider Notes (Signed)
Medical screening examination/treatment/procedure(s) were performed by non-physician practitioner and as supervising physician I was immediately available for consultation/collaboration.  Sunnie Nielsen, MD 11/05/12 [phone]

## 2012-11-05 NOTE — ED Provider Notes (Signed)
History     CSN: [id number]  Arrival date & time 11/05/12  0532   First MD Initiated Contact with Patient 11/05/12 [phone]      Chief Complaint  Patient presents with  . Back Pain    (Consider location/radiation/quality/duration/timing/severity/associated sxs/prior treatment) HPI   Pt presents to the ER for back pain after hauling wood this past Friday.  She injured her back many years ago from a fall and received a few injections and has not had any back problems since then.  She tells me that she felt a popping noise and now can feel a knot on her right low back.  She is ambulatory but its slow and with pain. No numbness, tingling, weakness to either leg. She has been taking 800mg  Ibuprofen, flexeril and icing it without long term relief.  She informs me that opiates tear up her stomach and that she does not want any narcotics. nad vss   Past Medical History  Diagnosis Date  . Reflux   . Abdominal pain   . Chronic headaches   . Arthritis     difficulty walking  . Hearing aid worn   . History of ITP   . Cyst     back    Past Surgical History  Procedure Date  . Wisdom tooth extraction   . Cholecystectomy 2010  . External ear surgery 2009/2010  . Knee ligament reconstruction     left  . Knee arthroscopy     right    Family History  Problem Relation Age of Onset  . Diabetes Mother   . Asthma Mother   . Diabetes Father   . Hypertension Father   . Kidney disease Father     History  Substance Use Topics  . Smoking status: Never Smoker   . Smokeless tobacco: Never Used  . Alcohol Use: Yes    OB History    Grav Para Term Preterm Abortions TAB SAB Ect Mult Living                  Review of Systems  Review of Systems  Gen: no weight loss, fevers, chills, night sweats  Neck: no neck pain  Lungs:No wheezing, coughing or hemoptysis CV: no chest pain, palpitations, dependent edema or orthopnea  Abd: no abdominal pain, nausea, vomiting  GU: no dysuria  or gross hematuria  MSK:  Right low back pain Neuro: no headache, no focal neurologic deficits  Skin: no abnormalities Psyche: negative.   Allergies  Claritin-d and Codeine  Home Medications   Current Outpatient Rx  Name  Route  Sig  Dispense  Refill  . ESOMEPRAZOLE MAGNESIUM 40 MG PO CPDR   Oral   Take 40 mg by mouth daily before breakfast.           . IBUPROFEN 200 MG PO TABS   Oral   Take 800 mg by mouth every 6 (six) hours as needed.         Marland Kitchen POLYETHYLENE GLYCOL 3350 PO PACK   Oral   Take 17 g by mouth daily.           Marland Kitchen DIAZEPAM 5 MG PO TABS   Oral   Take 1 tablet (5 mg total) by mouth every 6 (six) hours as needed for anxiety.   15 tablet   0   . LIDOCAINE 5 % EX PTCH   Transdermal   Place 1 patch onto the skin daily. Remove & Discard patch within 12 hours or  as directed by MD   1 patch   0   . LIDOCAINE 5 % EX PTCH   Transdermal   Place 1 patch onto the skin daily. Remove & Discard patch within 12 hours or as directed by MD   6 patch   1   . TRAMADOL HCL 50 MG PO TABS   Oral   Take 1 tablet (50 mg total) by mouth every 6 (six) hours as needed for pain.   20 tablet   0     BP 135/62  Pulse 69  Temp 98.2 F (36.8 C) (Oral)  Resp 18  SpO2 100%  Physical Exam  Nursing note and vitals reviewed. Constitutional: She appears well-developed and well-nourished. No distress.  HENT:  Head: Normocephalic and atraumatic.  Eyes: Pupils are equal, round, and reactive to light.  Neck: Normal range of motion. Neck supple.  Cardiovascular: Normal rate and regular rhythm.   Pulmonary/Chest: Effort normal.  Abdominal: Soft.  Musculoskeletal:       Lumbar back: She exhibits tenderness, pain and spasm. She exhibits normal range of motion, no bony tenderness, no swelling, no edema and no laceration.       Back:        Equal strength to bilateral lower extremities. Neurosensory  function adequate to both legs. Skin color is normal. Skin is warm and  moist. I see no step off deformity, no bony tenderness. Pt is able to ambulate without limp but is slow. Pain is relieved when sitting in certain positions. ROM is decreased due to pain. No crepitus, laceration, effusion, swelling.  Pulses are normal   Neurological: She is alert.  Skin: Skin is warm and dry.    ED Course  Procedures (including critical care time)  Labs Reviewed - No data to display No results found.   1. Low back pain   2. Back spasm       MDM  Patient with back pain. No neurological deficits. Patient is ambulatory. No warning symptoms of back pain including: loss of bowel or bladder control, night sweats, waking from sleep with back pain, unexplained fevers or weight loss, h/o cancer, IVDU, recent trauma. No concern for cauda equina, epidural abscess, or other serious cause of back pain. Conservative measures such as rest, ice/heat and pain medicine indicated with PCP follow-up if no improvement with conservative management.   Rx: Valium, lidoderm patch and Ultram        Dorthula Matas, [place] 11/05/12 [phone]

## 2013-01-27 ENCOUNTER — Other Ambulatory Visit: Payer: Self-pay

## 2013-03-16 ENCOUNTER — Other Ambulatory Visit (HOSPITAL_COMMUNITY): Payer: Self-pay | Admitting: Otolaryngology

## 2013-03-16 DIAGNOSIS — G8929 Other chronic pain: Secondary | ICD-10-CM

## 2013-03-27 ENCOUNTER — Ambulatory Visit (HOSPITAL_COMMUNITY): Admission: RE | Admit: 2013-03-27 | Payer: 59 | Source: Ambulatory Visit

## 2013-06-12 ENCOUNTER — Other Ambulatory Visit: Payer: Self-pay | Admitting: Obstetrics and Gynecology

## 2013-06-12 DIAGNOSIS — Z0000 Encounter for general adult medical examination without abnormal findings: Secondary | ICD-10-CM

## 2013-06-27 ENCOUNTER — Ambulatory Visit (HOSPITAL_COMMUNITY)
Admission: RE | Admit: 2013-06-27 | Discharge: 2013-06-27 | Disposition: A | Discharge status: Home / Self Care | Payer: 59 | Source: Ambulatory Visit | Attending: Obstetrics and Gynecology | Admitting: Obstetrics and Gynecology

## 2013-06-27 DIAGNOSIS — Z0000 Encounter for general adult medical examination without abnormal findings: Secondary | ICD-10-CM

## 2013-06-27 DIAGNOSIS — Z1231 Encounter for screening mammogram for malignant neoplasm of breast: Secondary | ICD-10-CM | POA: Insufficient documentation

## 2013-10-18 ENCOUNTER — Other Ambulatory Visit: Payer: Self-pay

## 2013-11-21 ENCOUNTER — Inpatient Hospital Stay (HOSPITAL_COMMUNITY): Payer: PRIVATE HEALTH INSURANCE

## 2013-11-21 ENCOUNTER — Encounter (HOSPITAL_COMMUNITY): Payer: Self-pay | Admitting: *Deleted

## 2013-11-21 ENCOUNTER — Inpatient Hospital Stay (HOSPITAL_COMMUNITY)
Admission: AD | Admit: 2013-11-21 | Discharge: 2013-11-21 | Disposition: A | Discharge status: Home / Self Care | Payer: PRIVATE HEALTH INSURANCE | Source: Ambulatory Visit | Attending: Emergency Medicine | Admitting: Emergency Medicine

## 2013-11-21 DIAGNOSIS — M129 Arthropathy, unspecified: Secondary | ICD-10-CM | POA: Insufficient documentation

## 2013-11-21 DIAGNOSIS — Y92410 Unspecified street and highway as the place of occurrence of the external cause: Secondary | ICD-10-CM | POA: Insufficient documentation

## 2013-11-21 DIAGNOSIS — W19XXXA Unspecified fall, initial encounter: Secondary | ICD-10-CM

## 2013-11-21 DIAGNOSIS — S0083XA Contusion of other part of head, initial encounter: Secondary | ICD-10-CM

## 2013-11-21 DIAGNOSIS — Y9301 Activity, walking, marching and hiking: Secondary | ICD-10-CM | POA: Insufficient documentation

## 2013-11-21 DIAGNOSIS — W010XXA Fall on same level from slipping, tripping and stumbling without subsequent striking against object, initial encounter: Secondary | ICD-10-CM | POA: Insufficient documentation

## 2013-11-21 DIAGNOSIS — Z862 Personal history of diseases of the blood and blood-forming organs and certain disorders involving the immune mechanism: Secondary | ICD-10-CM | POA: Insufficient documentation

## 2013-11-21 DIAGNOSIS — Z8679 Personal history of other diseases of the circulatory system: Secondary | ICD-10-CM | POA: Insufficient documentation

## 2013-11-21 DIAGNOSIS — R42 Dizziness and giddiness: Secondary | ICD-10-CM | POA: Insufficient documentation

## 2013-11-21 DIAGNOSIS — Z872 Personal history of diseases of the skin and subcutaneous tissue: Secondary | ICD-10-CM | POA: Insufficient documentation

## 2013-11-21 DIAGNOSIS — Z79899 Other long term (current) drug therapy: Secondary | ICD-10-CM | POA: Insufficient documentation

## 2013-11-21 DIAGNOSIS — K219 Gastro-esophageal reflux disease without esophagitis: Secondary | ICD-10-CM | POA: Insufficient documentation

## 2013-11-21 DIAGNOSIS — S0003XA Contusion of scalp, initial encounter: Secondary | ICD-10-CM | POA: Insufficient documentation

## 2013-11-21 DIAGNOSIS — Y9289 Other specified places as the place of occurrence of the external cause: Secondary | ICD-10-CM | POA: Insufficient documentation

## 2013-11-21 DIAGNOSIS — W1809XA Striking against other object with subsequent fall, initial encounter: Secondary | ICD-10-CM | POA: Insufficient documentation

## 2013-11-21 DIAGNOSIS — S1093XA Contusion of unspecified part of neck, initial encounter: Secondary | ICD-10-CM | POA: Insufficient documentation

## 2013-11-21 DIAGNOSIS — Z789 Other specified health status: Secondary | ICD-10-CM | POA: Insufficient documentation

## 2013-11-21 LAB — POCT I-STAT, CHEM 8
BUN: 9 mg/dL (ref 6–23)
Calcium, Ion: 1.17 mmol/L (ref 1.12–1.23)
Chloride: 103 mEq/L (ref 96–112)
Creatinine, Ser: 0.7 mg/dL (ref 0.50–1.10)
Glucose, Bld: 102 mg/dL — ABNORMAL HIGH (ref 70–99)
HCT: 39 % (ref 36.0–46.0)
Potassium: 4.3 mEq/L (ref 3.5–5.1)

## 2013-11-21 MED ORDER — FENTANYL CITRATE 0.05 MG/ML IJ SOLN
50.0000 ug | Freq: Once | INTRAMUSCULAR | Status: AC
  Administered 2013-11-21: 50 ug via INTRAVENOUS
  Filled 2013-11-21: qty 2

## 2013-11-21 MED ORDER — ONDANSETRON 8 MG PO TBDP: 8.0000 mg | ORAL_TABLET | Freq: Three times a day (TID) | ORAL | Status: DC | PRN

## 2013-11-21 MED ORDER — MECLIZINE HCL 25 MG PO TABS: ORAL_TABLET | ORAL | Status: DC

## 2013-11-21 MED ORDER — DIAZEPAM 5 MG PO TABS: 5.0000 mg | ORAL_TABLET | Freq: Three times a day (TID) | ORAL | Status: DC | PRN

## 2013-11-21 MED ORDER — PENICILLIN V POTASSIUM 500 MG PO TABS: 500.0000 mg | ORAL_TABLET | Freq: Four times a day (QID) | ORAL | Status: DC

## 2013-11-21 MED ORDER — HYDROCODONE-ACETAMINOPHEN 5-325 MG PO TABS: ORAL_TABLET | ORAL | Status: DC

## 2013-11-21 MED ORDER — ONDANSETRON HCL 4 MG/2ML IJ SOLN
4.0000 mg | Freq: Once | INTRAMUSCULAR | Status: AC
  Administered 2013-11-21: 4 mg via INTRAVENOUS
  Filled 2013-11-21: qty 2

## 2013-11-21 MED ORDER — HYDROCODONE-ACETAMINOPHEN 5-325 MG PO TABS
2.0000 | ORAL_TABLET | Freq: Once | ORAL | Status: AC
  Administered 2013-11-21: 2 via ORAL
  Filled 2013-11-21: qty 2

## 2013-11-21 MED ORDER — MECLIZINE HCL 25 MG PO TABS
25.0000 mg | ORAL_TABLET | Freq: Once | ORAL | Status: AC
  Administered 2013-11-21: 25 mg via ORAL
  Filled 2013-11-21: qty 1

## 2013-11-21 MED ORDER — IOHEXOL 350 MG/ML SOLN
100.0000 mL | Freq: Once | INTRAVENOUS | Status: AC | PRN
  Administered 2013-11-21: 100 mL via INTRAVENOUS

## 2013-11-21 NOTE — ED Provider Notes (Signed)
CSN: [id number]     Arrival date & time 11/21/13  1256 History   First MD Initiated Contact with Patient 11/21/13 1427     Chief Complaint  Patient presents with  . Fall   (Consider location/radiation/quality/duration/timing/severity/associated sxs/prior Treatment) Patient is a 52 y.o. female presenting with fall. The history is provided by the patient.  Fall This is a new problem. Pertinent negatives include no chest pain, no abdominal pain, no headaches and no shortness of breath.   patient was walking into work and tripped and fell, landing face first on her coffee month. It hit her in her mid face. No loss of conscious. She has been dizzy since. She states she felt as if she was spinning and she was going to pass out. She has had nausea. No localizing numbness or weakness. She's had some neck pain. Her pain is mostly in her upper lip area. No chest or abdominal pain. She had a previous history of vertigo.  Past Medical History  Diagnosis Date  . Reflux   . Abdominal pain   . Chronic headaches   . Arthritis     difficulty walking  . Hearing aid worn   . History of ITP   . Cyst     back   Past Surgical History  Procedure Laterality Date  . Wisdom tooth extraction    . Cholecystectomy  2010  . External ear surgery  2009/2010  . Knee ligament reconstruction      left  . Knee arthroscopy      right   Family History  Problem Relation Age of Onset  . Diabetes Mother   . Asthma Mother   . Diabetes Father   . Hypertension Father   . Kidney disease Father    History  Substance Use Topics  . Smoking status: Never Smoker   . Smokeless tobacco: Never Used  . Alcohol Use: Yes   OB History   Grav Para Term Preterm Abortions TAB SAB Ect Mult Living                 Review of Systems  Constitutional: Negative for activity change and appetite change.  Eyes: Negative for pain.  Respiratory: Negative for chest tightness and shortness of breath.   Cardiovascular: Negative for  chest pain and leg swelling.  Gastrointestinal: Negative for nausea, vomiting, abdominal pain and diarrhea.  Genitourinary: Negative for flank pain.  Musculoskeletal: Positive for neck pain. Negative for back pain and neck stiffness.  Skin: Positive for wound. Negative for rash.  Neurological: Positive for dizziness. Negative for weakness, numbness and headaches.  Psychiatric/Behavioral: Negative for behavioral problems.    Allergies  Claritin-d and Codeine  Home Medications   Current Outpatient Rx  Name  Route  Sig  Dispense  Refill  . ibuprofen (ADVIL,MOTRIN) 200 MG tablet   Oral   Take 800 mg by mouth every 6 (six) hours as needed.         . pantoprazole (PROTONIX) 40 MG tablet   Oral   Take 40 mg by mouth daily.         . polyethylene glycol (MIRALAX / GLYCOLAX) packet   Oral   Take 17 g by mouth daily.            BP 129/65  Pulse 55  Temp(Src) 98 F (36.7 C) (Oral)  Resp 16  SpO2 99% Physical Exam  Nursing note and vitals reviewed. Constitutional: She is oriented to person, place, and time. She appears well-developed  and well-nourished.  HENT:  Head: Normocephalic.  Mild tenderness to the middle forehead. No crepitance or deformity. There is swelling of the upper lip medially with hematoma. No tenderness of her nose. Teeth appear stable. Bilateral TMs no effusion. There is a residual tube in the left ear.  Eyes: EOM are normal. Pupils are equal, round, and reactive to light.  No nystagmus.  Neck:  Mild upper neck tenderness on the midline.  Cardiovascular: Normal rate, regular rhythm and normal heart sounds.   No murmur heard. Pulmonary/Chest: Effort normal and breath sounds normal. No respiratory distress. She has no wheezes. She has no rales.  Abdominal: Soft. Bowel sounds are normal. She exhibits no distension. There is no tenderness. There is no rebound and no guarding.  Musculoskeletal: Normal range of motion.  Neurological: She is alert and oriented  to person, place, and time. No cranial nerve deficit.  Skin: Skin is warm and dry.  Psychiatric: She has a normal mood and affect. Her speech is normal.    ED Course  Procedures (including critical care time) Labs Review Labs Reviewed - No data to display Imaging Review Dg Elbow Complete Left  11/21/2013   CLINICAL DATA:  Pain post trauma  EXAM: LEFT ELBOW - COMPLETE 3+ VIEW  COMPARISON:  None.  FINDINGS: Frontal, lateral, and bilateral oblique views were obtained. There is no fracture, dislocation, or effusion. Joint spaces appear intact. No erosive change.  IMPRESSION: No abnormality noted.   Electronically Signed   By: Bretta Bang M.D.   On: 11/21/2013 15:15   Ct Head Wo Contrast  11/21/2013   CLINICAL DATA:  Fall with facial injury.  Nausea.  Lightheadedness.  EXAM: CT HEAD WITHOUT CONTRAST  CT MAXILLOFACIAL WITHOUT CONTRAST  CT CERVICAL SPINE WITHOUT CONTRAST  TECHNIQUE: Multidetector CT imaging of the head, cervical spine, and maxillofacial structures were performed using the standard protocol without intravenous contrast. Multiplanar CT image reconstructions of the cervical spine and maxillofacial structures were also generated.  COMPARISON:  Multiple exams, including 10/09/2010, 12/20/2008, and 12/14/2005  FINDINGS: CT HEAD FINDINGS  The brainstem, cerebellum, cerebral peduncles, thalamus, basal ganglia, basilar cisterns, and ventricular system appear within normal limits. No intracranial hemorrhage, mass lesion, or acute CVA. Prior right mastoidectomy.  CT MAXILLOFACIAL FINDINGS  Prior right mastoidectomy. No facial fracture. Faint dystrophic skin calcifications anterior to the glabella and bridge of the nose. Orbits intact.  CT CERVICAL SPINE FINDINGS  Straightening of the normal cervical lordosis. No prevertebral soft tissue swelling or cervical spine fracture observed.  Posterior osseous ridging noted at C5-6 and C6-7 with left foraminal intervertebral spurring at C6-7 causing mild  osseous foraminal stenosis.  IMPRESSION: 1. No acute intracranial abnormality. No cervical spine fracture or subluxation. No facial fracture. 2. 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 3. Prior right mastoidectomy.   Electronically Signed   By: Herbie Baltimore M.D.   On: 11/21/2013 15:22   Ct Cervical Spine Wo Contrast  11/21/2013   CLINICAL DATA:  Fall with facial injury.  Nausea.  Lightheadedness.  EXAM: CT HEAD WITHOUT CONTRAST  CT MAXILLOFACIAL WITHOUT CONTRAST  CT CERVICAL SPINE WITHOUT CONTRAST  TECHNIQUE: Multidetector CT imaging of the head, cervical spine, and maxillofacial structures were performed using the standard protocol without intravenous contrast. Multiplanar CT image reconstructions of the cervical spine and maxillofacial structures were also generated.  COMPARISON:  Multiple exams, including 10/09/2010, 12/20/2008, and 12/14/2005  FINDINGS: CT HEAD FINDINGS  The brainstem,  cerebellum, cerebral peduncles, thalamus, basal ganglia, basilar cisterns, and ventricular system appear within normal limits. No intracranial hemorrhage, mass lesion, or acute CVA. Prior right mastoidectomy.  CT MAXILLOFACIAL FINDINGS  Prior right mastoidectomy. No facial fracture. Faint dystrophic skin calcifications anterior to the glabella and bridge of the nose. Orbits intact.  CT CERVICAL SPINE FINDINGS  Straightening of the normal cervical lordosis. No prevertebral soft tissue swelling or cervical spine fracture observed.  Posterior osseous ridging noted at C5-6 and C6-7 with left foraminal intervertebral spurring at C6-7 causing mild osseous foraminal stenosis.  IMPRESSION: 1. No acute intracranial abnormality. No cervical spine fracture or subluxation. No facial fracture. 2. 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 3. Prior right mastoidectomy.   Electronically Signed   By: Herbie Baltimore M.D.   On:  11/21/2013 15:22   Ct Maxillofacial Wo Cm  11/21/2013   CLINICAL DATA:  Fall with facial injury.  Nausea.  Lightheadedness.  EXAM: CT HEAD WITHOUT CONTRAST  CT MAXILLOFACIAL WITHOUT CONTRAST  CT CERVICAL SPINE WITHOUT CONTRAST  TECHNIQUE: Multidetector CT imaging of the head, cervical spine, and maxillofacial structures were performed using the standard protocol without intravenous contrast. Multiplanar CT image reconstructions of the cervical spine and maxillofacial structures were also generated.  COMPARISON:  Multiple exams, including 10/09/2010, 12/20/2008, and 12/14/2005  FINDINGS: CT HEAD FINDINGS  The brainstem, cerebellum, cerebral peduncles, thalamus, basal ganglia, basilar cisterns, and ventricular system appear within normal limits. No intracranial hemorrhage, mass lesion, or acute CVA. Prior right mastoidectomy.  CT MAXILLOFACIAL FINDINGS  Prior right mastoidectomy. No facial fracture. Faint dystrophic skin calcifications anterior to the glabella and bridge of the nose. Orbits intact.  CT CERVICAL SPINE FINDINGS  Straightening of the normal cervical lordosis. No prevertebral soft tissue swelling or cervical spine fracture observed.  Posterior osseous ridging noted at C5-6 and C6-7 with left foraminal intervertebral spurring at C6-7 causing mild osseous foraminal stenosis.  IMPRESSION: 1. No acute intracranial abnormality. No cervical spine fracture or subluxation. No facial fracture. 2. 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 3. Prior right mastoidectomy.   Electronically Signed   By: Herbie Baltimore M.D.   On: 11/21/2013 15:22    EKG Interpretation   None       MDM   1. Fall, initial encounter    Patient with fall. Facial contusion. Initial head CT and cervical spine CT are reassuring. Patient had worsening lightheadedness/dizziness/vertigo when she sat up. Will get imaging of vertebral arteries.    Juliet Rude. Rubin Payor,  MD 11/21/13 [phone]

## 2013-11-21 NOTE — MAU Provider Note (Signed)
[name] is a 52 y.o. female who presents to MAU today after a fall in the parking lot at [facility]. The patient is an employee of the lab here. She states that on her way in from the parking lot today she tripped and fell. She hit both elbows on the ground, but was wearing a jacket that cushioned the fall. She is having only minimal pain of the bilateral elbows. She states that she also hit her face with contact made to the upper lip. She states that she is having a lot of pain of that area. Upon presenting to MAU she states that she felt dizzy, lightheaded and nauseous. She did hit her head but denies LOC.   BP 117/63  Pulse 60  Temp(Src) 97.5 F (36.4 C) (Axillary)  Resp 18  SpO2 100% GENERAL: Well-developed, well-nourished female in no acute distress. Patient is awake, alert and oriented x 3 HEENT: Normocephalic. 2 cm abrasion on the upper lip with moderate swelling of the surrounding area.  LUNGS: Effort normal HEART: Regular rate  SKIN: Warm, dry and without erythema PSYCH: Normal mood and affect  MDM Discussed patient with Dr. Romeo Apple. ED physician at [facility]. He is accepting patient for transfer.   A: Fall, initial encounter  P: Transfer to [facility] for further evaluation of possible head trauma  Freddi Starr, PA-C 11/21/2013 1:36 PM

## 2013-11-21 NOTE — ED Notes (Signed)
She remains profoundly "dizzy" which she explains as "whenever I try to move I feel like I'm gonna pass out and throw up".  She specifically denies a spinning or rotating sensation; but describes it as feeling "unsteady". She states she has had several "ear operations" and an extensive history of vertigo.  Dr. Lynelle Doctor has spoken with her to let her know she has assumed her care from Dr. Rubin Payor.  Her speech remains clear; and she comments that "the pain medicine doesn't help very long".

## 2013-11-21 NOTE — MAU Note (Signed)
Feeling better, color returning to face.  Ice pack to upper lip.  Transferred to hall bed, in hall bed

## 2013-11-21 NOTE — ED Notes (Signed)
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  She states she was summoned to [facility] to perform phlebotomy; and on her way across their parking lot, she twisted her foot "on a rock", causing her to fall.  She states she fell forward as she was holding her coffee cup, and states she landed striking her face on her coffee cup as it contacted the ground.  She has area of edema/erythema at maxilla area.  She also has an abrasion of left elbow; and has edema at post. Left elbow with ecchymosis.  She c/o feeling lightheaded and nauseated.  Dr. Rubin Payor ordered Zofran on arrival, which I gave.  Her speech is clear and she is oriented x 4.  She denies neck pain, but c/o much facial pain radiating to globella area.

## 2013-11-21 NOTE — MAU Note (Signed)
Pt became light headed and nauseated after fall.  In triage, cool cloth to forehead and laid down.

## 2013-11-21 NOTE — MAU Note (Signed)
Tripped in parking lot at ~1250. Hit elbows, both are red, skin tore on left elbow; had mug in hand, face connected with mug- above upper lip. Slight swelling and bruising noted on upper left side of lip.

## 2013-11-21 NOTE — ED Provider Notes (Addendum)
Patient left a changes shift to get a CTA of her carotid results. Patient states she fell on the ground and hit her mouth on her mug. She complains of a lot of pain of her mid maxilla. She states her teeth do not feel loose however they are sore when she presses on them. Her teeth do not have any obvious fractures or chips. She also has complained of a lot of vertigo and has required several medications including Valium which she's used in the past for her vertigo. She has a chronic history of intermittent vertigo and has had several ear surgeries in the past by Dr. Dorma Russell. She states her dentist is Dr. Shari Heritage in [place].   Have discussed her need to be rechecked by Dr Dorma Russell and her dentist, Dr Shari Heritage.    Ct Angio Neck W/cm &/or Wo/cm  11/21/2013   CLINICAL DATA:  Fall. Neck injury. Dizziness. Rule out vertebral dissection  EXAM: CT ANGIOGRAPHY NECK  TECHNIQUE: Multidetector CT imaging of the neck was performed using the standard protocol during bolus administration of intravenous contrast. Multiplanar CT image reconstructions including MIPs were obtained to evaluate the vascular anatomy. Carotid stenosis measurements (when applicable) are obtained utilizing NASCET criteria, using the distal internal carotid diameter as the denominator.  CONTRAST:  OMNIPAQUE IOHEXOL 350 MG/ML SOLN  COMPARISON:  CT cervical spine 11/21/2013  FINDINGS: Three vessel arch.  Proximal great vessels are  widely patent.  Both carotid arteries are widely patent. There is no significant carotid stenosis or dissection. There is a small amount of calcification in the carotid bulb on the left not causing significant stenosis.  Both vertebral arteries are widely patent an equal in size. Both vertebral arteries contribute to the basilar. Negative for vertebral artery dissection.  Negative for soft tissue mass or adenopathy.  Review of the MIP images confirms the above findings.  IMPRESSION: No significant carotid or vertebral artery  stenosis or dissection.   Electronically Signed   By: Marlan Palau M.D.   On: 11/21/2013 16:52   Diagnoses that have been ruled out:  None  Diagnoses that are still under consideration:  None  Final diagnoses:  Contusion of face, initial encounter  Vertigo    New Prescriptions   DIAZEPAM (VALIUM) 5 MG TABLET    Take 1 tablet (5 mg total) by mouth every 8 (eight) hours as needed (dizziness).   HYDROCODONE-ACETAMINOPHEN (NORCO/VICODIN) 5-325 MG PER TABLET    Take 1 or 2 po Q 6hrs for pain   MECLIZINE (ANTIVERT) 25 MG TABLET    Take 1 or 2 po every 6 hrs for dizziness   ONDANSETRON (ZOFRAN ODT) 8 MG DISINTEGRATING TABLET    Take 1 tablet (8 mg total) by mouth every 8 (eight) hours as needed for nausea or vomiting.   PENICILLIN V POTASSIUM (VEETID) 500 MG TABLET    Take 1 tablet (500 mg total) by mouth 4 (four) times daily.    Plan discharge   Ward Givens, MD 11/21/13 2018  Ward Givens, MD 11/21/13 2020

## 2013-11-21 NOTE — ED Notes (Signed)
Bed: ZO10 Expected date: 11/21/13 Expected time:  Means of arrival: Ambulance [carelink] Comments: Pt from [facility]

## 2013-11-21 NOTE — ED Notes (Signed)
She c/o persistent "vertigo", especialy when she closes her eyes.  She describes it as "everything feels like it is moving, but not spinning".  This is coupled with profound nausea.  So far this visit, she has not vomited.  She remains oriented with clear speech.

## 2013-11-22 NOTE — MAU Provider Note (Signed)
Attestation of Attending Supervision of Advanced Practitioner (CNM/NP): Evaluation and management procedures were performed by the Advanced Practitioner under my supervision and collaboration.  I have reviewed the Advanced Practitioner's note and chart, and I agree with the management and plan.  Laurine Kuyper 11/22/2013 10:42 AM   

## 2014-08-09 ENCOUNTER — Other Ambulatory Visit (HOSPITAL_COMMUNITY): Payer: Self-pay | Admitting: Obstetrics and Gynecology

## 2014-08-09 DIAGNOSIS — Z1231 Encounter for screening mammogram for malignant neoplasm of breast: Secondary | ICD-10-CM

## 2014-09-02 ENCOUNTER — Ambulatory Visit (HOSPITAL_COMMUNITY): Payer: 59

## 2014-10-07 ENCOUNTER — Ambulatory Visit (HOSPITAL_COMMUNITY)
Admission: RE | Admit: 2014-10-07 | Discharge: 2014-10-07 | Disposition: A | Discharge status: Home / Self Care | Payer: 59 | Source: Ambulatory Visit | Attending: Obstetrics and Gynecology | Admitting: Obstetrics and Gynecology

## 2014-10-07 DIAGNOSIS — Z1231 Encounter for screening mammogram for malignant neoplasm of breast: Secondary | ICD-10-CM | POA: Diagnosis present

## 2016-01-16 DIAGNOSIS — Z01419 Encounter for gynecological examination (general) (routine) without abnormal findings: Secondary | ICD-10-CM | POA: Diagnosis not present

## 2016-01-16 DIAGNOSIS — F329 Major depressive disorder, single episode, unspecified: Secondary | ICD-10-CM | POA: Diagnosis not present

## 2016-01-16 DIAGNOSIS — Z1231 Encounter for screening mammogram for malignant neoplasm of breast: Secondary | ICD-10-CM | POA: Diagnosis not present

## 2016-01-16 DIAGNOSIS — Z6826 Body mass index (BMI) 26.0-26.9, adult: Secondary | ICD-10-CM | POA: Diagnosis not present

## 2016-01-16 DIAGNOSIS — Z79890 Hormone replacement therapy (postmenopausal): Secondary | ICD-10-CM | POA: Diagnosis not present

## 2016-01-16 MED FILL — PROGESTERONE 100 MG CAPSULE: 100 | 90 days supply | Qty: 90 | Fill #0

## 2016-01-16 MED FILL — ESTRADIOL 1 MG TABLET: 1 | 90 days supply | Qty: 90 | Fill #0

## 2016-02-25 MED FILL — OMEPRAZOLE DR 40 MG CAPSULE: 40 | 90 days supply | Qty: 90 | Fill #1

## 2016-03-30 MED FILL — POLYETHYLENE GLYCOL 3350 PO: 30 days supply | Qty: 1581 | Fill #1

## 2016-04-15 MED FILL — ESTRADIOL 1 MG TABLET: 1 | 90 days supply | Qty: 90 | Fill #1

## 2016-04-15 MED FILL — PROGESTERONE 100 MG CAPSULE: 100 | 90 days supply | Qty: 90 | Fill #1

## 2016-05-20 DIAGNOSIS — Z79890 Hormone replacement therapy (postmenopausal): Secondary | ICD-10-CM | POA: Diagnosis not present

## 2016-05-24 MED FILL — OMEPRAZOLE DR 40 MG CAPSULE: 40 | 90 days supply | Qty: 90 | Fill #2

## 2016-07-09 DIAGNOSIS — Z9181 History of falling: Secondary | ICD-10-CM | POA: Diagnosis not present

## 2016-07-09 DIAGNOSIS — Z1389 Encounter for screening for other disorder: Secondary | ICD-10-CM | POA: Diagnosis not present

## 2016-07-09 DIAGNOSIS — R0789 Other chest pain: Secondary | ICD-10-CM | POA: Diagnosis not present

## 2016-07-19 ENCOUNTER — Telehealth: Payer: Self-pay | Admitting: Internal Medicine

## 2016-07-19 NOTE — Telephone Encounter (Signed)
Received records from [facility] for appointment on 08/10/16 with Dr Debara Pickett.  Reords given to [employer] (medical records) for Dr Lysbeth Penner schedule on 08/10/16. lp

## 2016-07-26 MED FILL — PROGESTERONE 100 MG CAPSULE: 100 | 90 days supply | Qty: 90 | Fill #2

## 2016-07-26 MED FILL — ESTRADIOL 1 MG TABLET: 1 | 90 days supply | Qty: 90 | Fill #2

## 2016-08-10 ENCOUNTER — Ambulatory Visit: Payer: 59 | Admitting: Internal Medicine

## 2016-08-17 MED FILL — POLYETHYLENE GLYCOL 3350 PO: 30 days supply | Qty: 1581 | Fill #2

## 2016-08-31 MED FILL — OMEPRAZOLE DR 40 MG CAPSULE: 40 | 90 days supply | Qty: 90 | Fill #3

## 2016-11-03 DIAGNOSIS — Z0000 Encounter for general adult medical examination without abnormal findings: Secondary | ICD-10-CM | POA: Diagnosis not present

## 2016-11-05 MED FILL — PROGESTERONE 100 MG CAPSULE: 100 | 90 days supply | Qty: 90 | Fill #3

## 2016-11-05 MED FILL — ESTRADIOL 1 MG TABLET: 1 | 90 days supply | Qty: 90 | Fill #3

## 2016-11-08 DIAGNOSIS — H9212 Otorrhea, left ear: Secondary | ICD-10-CM | POA: Diagnosis not present

## 2016-11-08 DIAGNOSIS — H7292 Unspecified perforation of tympanic membrane, left ear: Secondary | ICD-10-CM | POA: Diagnosis not present

## 2016-11-08 DIAGNOSIS — H7291 Unspecified perforation of tympanic membrane, right ear: Secondary | ICD-10-CM | POA: Diagnosis not present

## 2016-11-08 MED FILL — CIPROFLOXACIN HCL 500 MG TA: 500 | 10 days supply | Qty: 20 | Fill #0

## 2016-11-11 ENCOUNTER — Ambulatory Visit (INDEPENDENT_AMBULATORY_CARE_PROVIDER_SITE_OTHER): Payer: 59 | Admitting: Cardiovascular Disease

## 2016-11-11 ENCOUNTER — Encounter: Payer: Self-pay | Admitting: Cardiovascular Disease

## 2016-11-11 VITALS — BP 117/72 | HR 53 | Ht 62.5 in | Wt 145.6 lb

## 2016-11-11 DIAGNOSIS — R0789 Other chest pain: Secondary | ICD-10-CM

## 2016-11-11 DIAGNOSIS — E785 Hyperlipidemia, unspecified: Secondary | ICD-10-CM

## 2016-11-11 DIAGNOSIS — E7800 Pure hypercholesterolemia, unspecified: Secondary | ICD-10-CM

## 2016-11-11 DIAGNOSIS — D693 Immune thrombocytopenic purpura: Secondary | ICD-10-CM | POA: Diagnosis not present

## 2016-11-11 DIAGNOSIS — R079 Chest pain, unspecified: Secondary | ICD-10-CM

## 2016-11-11 HISTORY — DX: Immune thrombocytopenic purpura: D69.3

## 2016-11-11 HISTORY — DX: Other chest pain: R07.89

## 2016-11-11 HISTORY — DX: Hyperlipidemia, unspecified: E78.5

## 2016-11-11 NOTE — Progress Notes (Signed)
Cardiology Office Note   Date:  11/11/2016   ID:  Christine Brady, DOB 08-Feb-1961, MRN [id number]  PCP:  Ronita Hipps, MD  Cardiologist:   Skeet Latch, MD   Chief Complaint  Patient presents with  . Follow-up  . Chest Pain    sharp chest pain that comes at random  . Leg Pain    at night in legs  . Dizziness    may come from inner ear problems      History of Present Illness: Christine Brady is a 55 y.o. female with ITP and GERD who presents for an evaluation of chest pan.  For the last three months she has noted sharp, substernal chest pain.  At first she thought maybe she had pulled a muscle but the pain hasn't improved.  It occurs sporadically, often while sitting at rest.  It also occurs when laying in bed. It ocassionally occurs with exertion.  The episodes typically last 30 minutes.  She initially feels sharp, 6/10 pain but then a dull pain persists.  There is no associated shortness of breath, nausea or diaphoresis.  The episodes occur 3-4 times per week.  She denies lower extremity edema, orthopnea or PND.  She does endorse occasional dizziness but attributes this to chronic inner ear problems.   Christine Brady has GERD that is fairly well-controlled.  She has been taking Prilosec for years.  She occasionally notes a sour taste in her mouth.  She does not exercise regularly.  She is thinking of joining a gym. 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   Past Medical History:  Diagnosis Date  . Abdominal pain   . Arthritis    difficulty walking  . Atypical chest pain 11/11/2016  . Cholesteatoma of right ear   . Chronic headaches   . 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HCC) 11/11/2016  . Cyst    back  . Hearing aid worn   . History of ITP   . Hyperlipidemia 11/11/2016  . Reflux     Past Surgical History:  Procedure Laterality Date  . CHOLECYSTECTOMY  2010  . EXTERNAL EAR SURGERY  2009/2010  . KNEE ARTHROSCOPY       right  . KNEE LIGAMENT RECONSTRUCTION     left  . WISDOM TOOTH EXTRACTION       Current Outpatient Prescriptions  Medication Sig Dispense Refill  . calcium carbonate (CALCIUM 600) 600 MG TABS tablet Take 600 mg by mouth 2 (two) times daily with a meal.    . cyanocobalamin 500 MCG tablet Take 500 mcg by mouth daily.    Marland Kitchen estradiol (ESTRACE) 1 MG tablet Take 1 mg by mouth daily.    Marland Kitchen ibuprofen (ADVIL,MOTRIN) 200 MG tablet Take 800 mg by mouth every 6 (six) hours as needed.    Marland Kitchen omeprazole (PRILOSEC) 40 MG capsule Take 40 mg by mouth daily.  3  . progesterone (PROMETRIUM) 100 MG capsule Take 100 mg by mouth daily.     No current facility-administered medications for this visit.     Allergies:   Claritin-d [loratadine-pseudoephedrine er] and Codeine    Social History:  The patient  reports that she has never smoked. She has never used smokeless tobacco. She reports that she drinks alcohol. She reports that she does not use drugs.   Family History:  The patient's family history includes Alcohol abuse in her father and mother; Asthma in her mother; COPD in her sister; Cerebral  palsy in her brother; Crohn's disease in her sister; Diabetes in her father and mother; Diabetes type II in her father and mother; Hypertension in her father and mother; Kidney disease in her father; Thrombocytopenia in her sister.    ROS:  Please see the history of present illness.   Otherwise, review of systems are positive for none.   All other systems are reviewed and negative.    PHYSICAL EXAM: VS:  BP 117/72   Pulse (!) 53   Ht 5' 2.5" (1.588 m)   Wt 66 kg (145 lb 9.6 oz)   BMI 26.21 kg/m  , BMI Body mass index is 26.21 kg/m. GENERAL:  Well appearing HEENT:  Pupils equal round and reactive, fundi not visualized, oral mucosa unremarkable NECK:  No jugular venous distention, waveform within normal limits, carotid upstroke brisk and symmetric, no bruits, no thyromegaly LYMPHATICS:  No cervical  adenopathy LUNGS:  Clear to auscultation bilaterally HEART:  RRR.  PMI not displaced or sustained,S1 and S2 within normal limits, no S3, no S4, no clicks, no rubs, no murmurs ABD:  Flat, positive bowel sounds normal in frequency in pitch, no bruits, no rebound, no guarding, no midline pulsatile mass, no hepatomegaly, no splenomegaly EXT:  2 plus pulses throughout, no edema, no cyanosis no clubbing SKIN:  No rashes no nodules NEURO:  Cranial nerves II through XII grossly intact, motor grossly intact throughout PSYCH:  Cognitively intact, oriented to person place and time   EKG:  EKG is ordered today. The ekg ordered today demonstrates sinus bradycardia.  Rate 53 bpm.    Recent Labs: No results found for requested labs within last 8760 hours.    Lipid Panel No results found for: CHOL, TRIG, HDL, CHOLHDL, VLDL, LDLCALC, LDLDIRECT    Wt Readings from Last 3 Encounters:  11/11/16 66 kg (145 lb 9.6 oz)  11/26/11 62.6 kg (138 lb)  10/28/11 64.9 kg (143 lb)      ASSESSMENT AND PLAN:  # Atypical chest pain: Christine Brady symptoms are atypical.  However she does have them with exersion at times, which is concerning for ischemia.  We will refer her for ETT.  If negative, consider GERD and esophageal spasm.  # Hyperlipidemia: Diet controlled.  She was encouraged to start exercising if her stress test is normal.   Current medicines are reviewed at length with the patient today.  The patient does not have concerns regarding medicines.  The following changes have been made:  no change  Labs/ tests ordered today include:   Orders Placed This Encounter  Procedures  . Exercise Tolerance Test  . EKG 12-Lead     Disposition:   FU with Nirvan Laban C. Oval Linsey, MD, [facility] as needed.     This note was written with the assistance of speech recognition software.  Please excuse any transcriptional errors.  Signed, Kirstin Kugler C. Oval Linsey, MD, [facility]  11/11/2016 3:02 PM    [facility]

## 2016-11-11 NOTE — Patient Instructions (Signed)
Medication Instructions:  Your physician recommends that you continue on your current medications as directed. Please refer to the Current Medication list given to you today.  Labwork: NONE  Testing/Procedures: Your physician has requested that you have an exercise tolerance test. For further information please visit [email or web address]. Please also follow instruction sheet, as given.  Follow-Up: AS NEEDED    If you need a refill on your cardiac medications before your next appointment, please call your pharmacy.

## 2016-11-23 ENCOUNTER — Telehealth (HOSPITAL_COMMUNITY): Payer: Self-pay

## 2016-11-23 NOTE — Telephone Encounter (Signed)
Encounter complete. 

## 2016-11-24 DIAGNOSIS — H7202 Central perforation of tympanic membrane, left ear: Secondary | ICD-10-CM | POA: Diagnosis not present

## 2016-11-24 DIAGNOSIS — H900 Conductive hearing loss, bilateral: Secondary | ICD-10-CM | POA: Diagnosis not present

## 2016-11-24 DIAGNOSIS — H6041 Cholesteatoma of right external ear: Secondary | ICD-10-CM | POA: Diagnosis not present

## 2016-11-24 DIAGNOSIS — H6983 Other specified disorders of Eustachian tube, bilateral: Secondary | ICD-10-CM | POA: Diagnosis not present

## 2016-11-25 ENCOUNTER — Ambulatory Visit (HOSPITAL_COMMUNITY)
Admission: RE | Admit: 2016-11-25 | Discharge: 2016-11-25 | Disposition: A | Discharge status: Home / Self Care | Payer: 59 | Source: Ambulatory Visit | Attending: Cardiology | Admitting: Cardiology

## 2016-11-25 ENCOUNTER — Other Ambulatory Visit (HOSPITAL_COMMUNITY): Payer: Self-pay | Admitting: Otolaryngology

## 2016-11-25 DIAGNOSIS — R079 Chest pain, unspecified: Secondary | ICD-10-CM | POA: Diagnosis not present

## 2016-11-25 DIAGNOSIS — H6041 Cholesteatoma of right external ear: Secondary | ICD-10-CM

## 2016-11-25 DIAGNOSIS — H748X2 Other specified disorders of left middle ear and mastoid: Secondary | ICD-10-CM | POA: Insufficient documentation

## 2016-11-25 DIAGNOSIS — H7092 Unspecified mastoiditis, left ear: Secondary | ICD-10-CM | POA: Diagnosis not present

## 2016-11-25 LAB — EXERCISE TOLERANCE TEST
CHL CUP MPHR: 166 {beats}/min
CHL CUP RESTING HR STRESS: 53 {beats}/min
CHL RATE OF PERCEIVED EXERTION: 17
Estimated workload: 10.4 METS
Exercise duration (min): 9 min
Exercise duration (sec): 15 s
Peak HR: 169 {beats}/min
Percent HR: 101 %

## 2016-11-30 ENCOUNTER — Ambulatory Visit (HOSPITAL_COMMUNITY)
Admission: RE | Admit: 2016-11-30 | Discharge: 2016-11-30 | Disposition: A | Discharge status: Home / Self Care | Payer: 59 | Source: Ambulatory Visit | Attending: Otolaryngology | Admitting: Otolaryngology

## 2016-11-30 ENCOUNTER — Encounter (HOSPITAL_COMMUNITY): Payer: Self-pay

## 2016-11-30 DIAGNOSIS — H6041 Cholesteatoma of right external ear: Secondary | ICD-10-CM

## 2016-11-30 DIAGNOSIS — H748X2 Other specified disorders of left middle ear and mastoid: Secondary | ICD-10-CM | POA: Diagnosis not present

## 2016-11-30 DIAGNOSIS — H7092 Unspecified mastoiditis, left ear: Secondary | ICD-10-CM | POA: Diagnosis not present

## 2016-11-30 DIAGNOSIS — R079 Chest pain, unspecified: Secondary | ICD-10-CM | POA: Diagnosis not present

## 2016-11-30 MED ORDER — IOPAMIDOL (ISOVUE-300) INJECTION 61%
75.0000 mL | Freq: Once | INTRAVENOUS | Status: AC | PRN
  Administered 2016-11-30: 75 mL via INTRAVENOUS

## 2016-11-30 MED ORDER — IOPAMIDOL (ISOVUE-300) INJECTION 61%
INTRAVENOUS | Status: AC
  Filled 2016-11-30: qty 75

## 2016-12-01 MED FILL — OMEPRAZOLE DR 40 MG CAPSULE: 40 | 90 days supply | Qty: 90 | Fill #0

## 2016-12-01 MED FILL — POLYETHYLENE GLYCOL 3350 PO: 30 days supply | Qty: 527 | Fill #0

## 2016-12-14 DIAGNOSIS — H524 Presbyopia: Secondary | ICD-10-CM | POA: Diagnosis not present

## 2017-01-05 DIAGNOSIS — H7101 Cholesteatoma of attic, right ear: Secondary | ICD-10-CM | POA: Diagnosis not present

## 2017-01-05 DIAGNOSIS — H6041 Cholesteatoma of right external ear: Secondary | ICD-10-CM | POA: Diagnosis not present

## 2017-01-05 DIAGNOSIS — H7202 Central perforation of tympanic membrane, left ear: Secondary | ICD-10-CM | POA: Diagnosis not present

## 2017-01-05 DIAGNOSIS — H6983 Other specified disorders of Eustachian tube, bilateral: Secondary | ICD-10-CM | POA: Diagnosis not present

## 2017-01-05 DIAGNOSIS — H900 Conductive hearing loss, bilateral: Secondary | ICD-10-CM | POA: Diagnosis not present

## 2017-01-05 DIAGNOSIS — H7121 Cholesteatoma of mastoid, right ear: Secondary | ICD-10-CM | POA: Diagnosis not present

## 2017-01-12 MED FILL — CEFPROZIL 500 MG TABLET: 500 | 10 days supply | Qty: 20 | Fill #0

## 2017-01-12 MED FILL — diazePAM 5 MG TABS: 5 | 6 days supply | Qty: 20 | Fill #0

## 2017-01-13 ENCOUNTER — Encounter (HOSPITAL_BASED_OUTPATIENT_CLINIC_OR_DEPARTMENT_OTHER): Payer: Self-pay | Admitting: *Deleted

## 2017-01-17 MED FILL — traMADol HCL 50 MG TABS: 50 | 5 days supply | Qty: 30 | Fill #0

## 2017-01-17 NOTE — H&P (Signed)
Christine Brady is an 56 y.o. female.   Chief Complaint: 1. Recurrent R mastoid, attic, middle ear cholesteatoma HPI: See H&P below  History & Physical Examination   Patient:  Christine Brady  Date of Birth: [birth year]  Provider: Vicie Mutters, MD, MS, FACS  Date of Service:  Jan 05, 2017  Location: [facility], [place], [place]                  [place], [place]   [id number]                                Ph: [phone], Fax: [phone]                  [email or web address]     Provider: Vicie Mutters, MD, MS, FACS Encounter Date: Jan 05, 2017 Patient: Christine Brady, Christine Brady    [id number]) Sex: Female       DOB: 02/17/1961      Age: 64 year 3 month 1 week       Race: White Address: [address]  [place]  [address]    Pref. Phone(H): [phone] Primary Dr.: Peter Garter Insurance(s):  [phone]) (PP)  Referred By:  Tamera Stands, MD   [place] CT: Type: procedure  [id number]  desc:Documentation of current medications (procedure)  Visit Type: Christine Brady, a 50 year 3 month 1 week White female is here today for a return visit.  Complaint/HPI: The patient was here today for follow-up of recurrent right mastoid and ear, canal cholesteatoma and for follow-up of pain above her left ear. The patient undergone excision of a right attic cholesteatoma in February 2010 by myself and then was lost to follow-up since 2014. She has developed a recurrence of the right mastoid cholesteatoma that has now presented as a bulge in her right posterior superior medial ear canal. A CT scan of her temporal bones has documented the recurrence. She also complains of some pain in her left supra-auricular area. She has not been complaining of otorrhea. 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 She is cared for by Dr. Helene Kelp in [place].  Previous history: The patient was seen today for evaluation of left ear pain after having had her left ear irrigated by her family physician. The irritation took place on November 08, 2016. She experienced extreme pain immediately and was then seen in [place] by an otolaryngologist who removed a dislodged tube AS. No mention was made of any pathology in her right ear. Patient is well known to me as she is undergone a right canal up tympanomastoidectomy with excision of right attic cholesteatoma in the year 2010. She has been lost to follow-up since 2014. She continues to work as a Charity fundraiser at [employer] and at [employer].  Previous history: The patient was here today complaining of intermittent vertigo where the room seems to turn around and intermittent bilateral ear pain. Patient is not having positional vertigo. She does have a history of migraine disease. She feels very imbalanced with the spells she has recently had her glucose and thyroid check. Her  blood pressure has been increased to 126/77, 128/88, and 128/92. Today, she was 112/77. Her normal blood pressure is 105/ 65. The patient also hears a crackling sound in her right ear. The patient has been experiencing chronic by lateral occipital headaches for the past month and a half.   Current Medication: 1. Nexium Dr 40 Mg Capsule (Other MD)   Medical History: Vaccinations: Flu vaccinations: Yes, flu shot - since Sep 12, 2016 - code [phone].  Ear Operations: . 02-06-07 Rev BMTs 01-22-09 R canal up tympanomastoidectomy with exc of R attic cholesteatoma 04-23-09 UMT AD.  Surgical History: Prior surgeries include gallbladder surgery and knee and ear surgery.  Family History: The patient has a family history of Diabetes mellitus and ITP.  Social History: Smoking: Her current smoking status is never smoker/non-smoker. Marital Status: Patient is  single. HIV status: (-) HIV status. Recreational Drug Use: She denies recreational drug use.  Allergy: Codeine  ROS: General: (-) fever, (-) chills, (-) night sweats, (-) fatigue, (-) weakness, (-) changes in appetite or weight. (-) allergies, (-) not immunocompromised. Head: (-) headaches, (-) head injury or deformity. Eyes: (-) visual changes, (-) eye pain, (-) eye discharges, (-) redness, (-) itching, (-) excessive tearing, (-) double or blurred vision, (-) glaucoma, (-) cataracts. Ears: (+) tinnitus. Nose and Sinuses: (-) frequent colds, (-) nasal stuffiness or itchiness, (-) postnasal drip, (-) hay fever, (-) nosebleeds, (-) sinus trouble. Mouth and Throat: (-) bleeding gums, (-) toothache, (-) odd taste sensations, (-) sores on tongue, (-) frequent sore throat, (-) hoarseness. Neck: (-) swollen glands, (-) enlarged thyroid, (-) neck pain. Cardiac: (+) chest pain. Respiratory: (-) cough, (-) hemoptysis, (-) shortness of breath, (-) cyanosis, (-) wheezing, (-) nocturnal choking or gasping, (-) TB exposure. Breasts: (-) nipple discharge, (-) breast lumps, (-) breast pain. Gastrointestinal: (+) reflux. Urinary: (-) dysuria, (-) frequency, (-) urgency, (-) hesitancy, (-) polyuria, (-) nocturia, (-) hematuria, (-) urinary incontinence, (-) flank pain, (-) change in urinary habits. Gynecologic/Urologic: (-) genital sores or lesions, (-) history of STD, (-) sexual difficulties. Musculoskeletal: (+) arthritis. Peripheral Vascular: (-) intermittent claudication, (-) cramps, (-) varicose veins, (-) thrombophlebitis. Neurological: (+) migraine headaches. Psychiatric: (-) anxiety, (-) depression, (-) sleep disturbance, (-) irritability, (-) mood swings, (-) suicidal thoughts or ideations. Endocrine: (-) heat or cold intolerance, (-) excessive sweating, (-) diabetes, (-) excessive thirst, (-) excessive hunger, (-) excessive urination, (-) hirsutism, (-) change in ring or shoe  size. Hematologic/Lymphatic: ITP. Skin: (-) rashes, (-) lumps, (-) itching, (-) dryness, (-) acne, (-) discoloration, (-) recurrent skin infections, (-) changes in hair, nails or moles.  Vital Signs: Weight:   64.41 kgs Height:   5\' 2"  BMI:   25.97 BSA:   1.68 BP:   131/84  Examination: Prior to the examination, I have reviewed: (1) the patient's current medications and allergies, (2) medical, family, and social histories, (3) review of systems, and (4) vital signs.  General Appearance - Adult: The patient is a well-developed, well-nourished, female, has no recognizable syndromes or patterns of malformation, and is in no acute distress. She is awake, alert, coherent, spontaneous, and logical. She is oriented to time, place, and person and communicates without difficulty.  Head: The patient's head was normocephalic and without any evidence of trauma or lesions.  Face: Her facial motion was intact and symmetric bilaterally with normal resting facial tone and voluntary facial power.  Skin: Gross inspection of her facial skin demonstrated no evidence of abnormality.  Eyes: Her pupils are equal,  regular, reactive to light and accommodate (PERRLA). Extraocular movements were intact (EOMI). Conjunctivae were normal. There was no sclera icterus. There was no nystagmus. Eyelids appeared normal. There was no ptosis, lid lag, lid edema, or lagophthalmos.  External ears: Examination of the external ears revealed Well healed right postauricular incision. There is no evidence of any pathology in her left supra-auricular area.  External auditory canals: Examination of the external auditory canals revealed Bulging mass in the posterior superior canal wall medially that is soft on palpation. There is a soft mass that represents recurrent cholesteatoma. I cannot tell if the mass extends into the posterior attic on visual exam. It dose extend into the attic on CT scan.  Right Tympanic Membrane: The patient  has an atrophic right tympanic membrane without a middle ear effusion. The TM is retracted. She has adhesive OM without a middle ear effusion.  Left Tympanic Membrane: There was a 50% perforation The perforation was located inferiorly.  Audiology Procedures: Audiogram/Graph Audiogram: I have referred the patient for audiometric testing. I have reviewed the patient's audiogram. (EMK).  Procedure:  Patient was placed in a special soundproof testing booth with earphones. Sounds were passed through the earphones. Each ear was tested separately. The patient was asked which part of the ear sound was heard. Earphones were removed, bone conduction hearing was then tested by having a vibrating instrument held close against the patient's ear. The patient was found have bilateral moderate conductive hearing losses, right ear greater than left ear with SRTs of 30 DB AD and 25 DB AS with 100% discrimination AU.  Impression: Other:  1. Patient has definite recurrent cholesteatoma of the right medial posterior canal wall and mastoid. I suspect that the posterior canal wall is completely eroded medially, just lateral to the facial canal. 2. I reviewed the CT scan with the patient and showed her images of the recurrent cholesteatoma. I have recommended a revision right tympanomastoidectomy, possible modified radical mastoidectomy, possible ossiculoplasty, four hours, [facility], general anesthesia, with a 23 hour overnight stay. Risks, complications, and alternatives of the procedure were explained to her. Questions were invited and answered. Informed consent is to be signed and witnessed. Preoperative teaching and counseling were provided. Patient has had significant vomiting following anesthesia and may require a propofol infusion technique. 3. 50% dry inferior central perforation AS following tube ejection and removal, elsewhere. 4. History of ITP with platelet counts that run in the 90,000 range.  Patient does not report any bleeding issues recently. 5. The patient will let us know when she would like to proceed with the procedure. 13. Obtain her old operative report from [facility] from January 22, 2009.  Plan: Clinical summary letter made available to patient today. This letter may not be complete at time of service. Please contact our office within 3 days for a completed summary of today's visit.  Status: Unstable ear(s) - AD. Medications: None required. and.  Trial of meclizine 12.5 mg QHS.  Diet: Low Sodium/Salt. Procedure: Revision right canal up tympanomastoidectomy, possible modified radical mastoidectomy, possible ossiculoplasty, for excision of recurrent mastoid, attic, and external auditory canal cholesteatoma.  Informed consent: Informed consent was provided in a quiet examination room and was witnessed. Risks, complications, and alternatives of ear surgery (such as doing nothing or using a hearing aid) were explained to the patient and included, but were not limited to: infection, bleeding, reaction to anesthesia, delayed perforation of the tympanic membrane and need for future repair, facial paresis or  paralysis, tinnitus, vertigo and/or disequilibrium, partial or total loss of hearing, transient or prolonged taste disturbance, need for additional procedures, failure and/extrusion of prostheses, complications related to the use of cements, hypertrophic scar formation, ear numbness, failure to preserve or improve hearing, other unforeseen or unpredictable complications, anesthetic neurotoxicity, death, etc. Questions were invited and answered. Preoperative teaching and counseling were provided. Informed consent - status: Informed consent was provided and was signed and witnessed. Follow-Up: Postoperative visit as scheduled.  Diagnosis: H71.21  Cholesteatoma of mastoid, right ear  H71.01  Cholesteatoma of attic, right ear  H60.41  Cholesteatoma of right external ear   H90.0  Conductive hearing loss, bilateral  H69.83  Other specified disorders of Eustachian tube, bilateral  H72.02  Central perforation TM, left ear   Careplan: (1)  a BMI - HNS14 Body Mass Index (BMI)  Follow-up plan given.  We have noticed an issue with your BMI (Body Mass Index). We recommend that you contact your family physician. (2) a Hypertension/Hypotension - HNS16 Blood Pressure: High Blood Pressure (Hypertension) or Low Blood Pressure ( Hypotension) Follow up plan given.  We have noticed an issue with your blood pressure.  We recommend that you contact your family physician. (3) Hearing Loss (4) Postop Mastoidectomy/Tympanomastoidectomy (5) Tympanic Membrane Perforation  Followup: Postop visit   This visit note has been electronically signed off by Vicie Mutters, MD, MS, FACS on 01/05/2017 at 07:47 PM.       Next Appointment: 01/19/2017 at 08:30 AM     Past Medical History:  Diagnosis Date  . Abdominal pain   . Arthritis    difficulty walking  . Atypical chest pain 11/11/2016  . Cholesteatoma of right ear   . Chronic headaches   . 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HCC) 11/11/2016  . Cyst    back  . Hearing aid worn   . History of ITP   . Hyperlipidemia 11/11/2016  . PONV (postoperative nausea and vomiting)   . Reflux     Past Surgical History:  Procedure Laterality Date  . CHOLECYSTECTOMY  2010  . EXTERNAL EAR SURGERY  2009/2010  . KNEE ARTHROSCOPY     right  . KNEE LIGAMENT RECONSTRUCTION     left  . WISDOM TOOTH EXTRACTION      Family History  Problem Relation Age of Onset  . Diabetes Mother   . Asthma Mother   . Diabetes type II Mother   . Alcohol abuse Mother   . Hypertension Mother   . Diabetes Father   . Hypertension Father   . Kidney disease Father   . Diabetes type II Father   . Alcohol abuse Father   . Crohn's disease Sister   . Cerebral palsy Brother   . COPD Sister   . Thrombocytopenia Sister    Social History:   reports that she has never smoked. She has never used smokeless tobacco. She reports that she drinks alcohol. She reports that she does not use drugs.  Allergies:  Allergies  Allergen Reactions  . Claritin-D [Loratadine-Pseudoephedrine Er] Rash  . Codeine Rash    No prescriptions prior to admission.    No results found for this or any previous visit (from the past 48 hour(s)). No results found.  ROS  Height 5' 2.5" (1.588 m), weight 64.4 kg (142 lb). Physical Exam   Assessment/Plan 1. Recurrent R mastoid, attic and middle ear cholesteatoma with cholesteatoma bulging through the posterior medial canal wall. 2. The patient has been counseled that she would benefit  from a revision R tympanomastoidectomy, possible modified radical mastoidectomy, possible ossiculoplasty, 4 hours, [facility], general ET anesthesia, with a 23 hr. RCC stay. Risks, complications, and alternatives of the procedure have been explained to her. Questions have been invited and answered. Informed consent has been signed and witnessed. Preoperative teaching and counseling have been provided.  3. Hx of ITP (platelet counts in the 90K range) 4. The procedure is scheduled on 01-19-17 at 8:30 am at [facility].  Fannie Knee, MD 01/17/2017, 10:11 AM

## 2017-01-19 ENCOUNTER — Ambulatory Visit (HOSPITAL_BASED_OUTPATIENT_CLINIC_OR_DEPARTMENT_OTHER)
Admission: RE | Admit: 2017-01-19 | Discharge: 2017-01-20 | Disposition: A | Discharge status: Home / Self Care | Payer: 59 | Source: Ambulatory Visit | Attending: Otolaryngology | Admitting: Otolaryngology

## 2017-01-19 ENCOUNTER — Encounter (HOSPITAL_BASED_OUTPATIENT_CLINIC_OR_DEPARTMENT_OTHER)
Admission: RE | Disposition: A | Discharge status: Home / Self Care | Payer: Self-pay | Source: Ambulatory Visit | Attending: Otolaryngology

## 2017-01-19 ENCOUNTER — Encounter (HOSPITAL_BASED_OUTPATIENT_CLINIC_OR_DEPARTMENT_OTHER): Payer: Self-pay | Admitting: *Deleted

## 2017-01-19 ENCOUNTER — Ambulatory Visit (HOSPITAL_BASED_OUTPATIENT_CLINIC_OR_DEPARTMENT_OTHER): Payer: 59 | Admitting: Anesthesiology

## 2017-01-19 DIAGNOSIS — K219 Gastro-esophageal reflux disease without esophagitis: Secondary | ICD-10-CM | POA: Diagnosis not present

## 2017-01-19 DIAGNOSIS — H7121 Cholesteatoma of mastoid, right ear: Secondary | ICD-10-CM | POA: Diagnosis not present

## 2017-01-19 DIAGNOSIS — Z885 Allergy status to narcotic agent status: Secondary | ICD-10-CM | POA: Insufficient documentation

## 2017-01-19 DIAGNOSIS — H7201 Central perforation of tympanic membrane, right ear: Secondary | ICD-10-CM | POA: Diagnosis not present

## 2017-01-19 DIAGNOSIS — H7101 Cholesteatoma of attic, right ear: Secondary | ICD-10-CM | POA: Insufficient documentation

## 2017-01-19 DIAGNOSIS — H6041 Cholesteatoma of right external ear: Secondary | ICD-10-CM | POA: Insufficient documentation

## 2017-01-19 DIAGNOSIS — E785 Hyperlipidemia, unspecified: Secondary | ICD-10-CM | POA: Diagnosis not present

## 2017-01-19 DIAGNOSIS — M1990 Unspecified osteoarthritis, unspecified site: Secondary | ICD-10-CM | POA: Insufficient documentation

## 2017-01-19 DIAGNOSIS — H900 Conductive hearing loss, bilateral: Secondary | ICD-10-CM | POA: Insufficient documentation

## 2017-01-19 DIAGNOSIS — I10 Essential (primary) hypertension: Secondary | ICD-10-CM | POA: Diagnosis not present

## 2017-01-19 DIAGNOSIS — H6983 Other specified disorders of Eustachian tube, bilateral: Secondary | ICD-10-CM | POA: Diagnosis not present

## 2017-01-19 DIAGNOSIS — Z888 Allergy status to other drugs, medicaments and biological substances status: Secondary | ICD-10-CM | POA: Diagnosis not present

## 2017-01-19 DIAGNOSIS — D693 Immune thrombocytopenic purpura: Secondary | ICD-10-CM | POA: Diagnosis not present

## 2017-01-19 DIAGNOSIS — Z87898 Personal history of other specified conditions: Secondary | ICD-10-CM

## 2017-01-19 DIAGNOSIS — H7191 Unspecified cholesteatoma, right ear: Secondary | ICD-10-CM | POA: Diagnosis not present

## 2017-01-19 HISTORY — DX: Other specified postprocedural states: Z98.890

## 2017-01-19 HISTORY — PX: MASTOIDECTOMY: SHX711

## 2017-01-19 HISTORY — DX: Nausea with vomiting, unspecified: R11.2

## 2017-01-19 SURGERY — MASTOIDECTOMY
Anesthesia: General | Site: Ear | Laterality: Right

## 2017-01-19 MED ORDER — HYDROMORPHONE HCL 1 MG/ML IJ SOLN
0.2500 mg | INTRAMUSCULAR | Status: DC | PRN
  Administered 2017-01-19 (×2): 0.5 mg via INTRAVENOUS
  Administered 2017-01-19 (×2): 0.25 mg via INTRAVENOUS

## 2017-01-19 MED ORDER — PROPOFOL 500 MG/50ML IV EMUL
INTRAVENOUS | Status: AC
  Filled 2017-01-19: qty 150

## 2017-01-19 MED ORDER — PANTOPRAZOLE SODIUM 40 MG PO TBEC: 40.0000 mg | DELAYED_RELEASE_TABLET | Freq: Every day | ORAL | Status: DC

## 2017-01-19 MED ORDER — MIDAZOLAM HCL 2 MG/2ML IJ SOLN
INTRAMUSCULAR | Status: AC
  Filled 2017-01-19: qty 2

## 2017-01-19 MED ORDER — DEXAMETHASONE SODIUM PHOSPHATE 10 MG/ML IJ SOLN
8.0000 mg | Freq: Once | INTRAMUSCULAR | Status: AC
  Administered 2017-01-19: 8 mg via INTRAVENOUS
  Filled 2017-01-19: qty 1

## 2017-01-19 MED ORDER — CEFAZOLIN IN D5W 1 GM/50ML IV SOLN
1.0000 g | Freq: Three times a day (TID) | INTRAVENOUS | Status: DC
  Administered 2017-01-19 – 2017-01-20 (×2): 1 g via INTRAVENOUS
  Filled 2017-01-19 (×2): qty 50

## 2017-01-19 MED ORDER — DIAZEPAM 5 MG PO TABS
5.0000 mg | ORAL_TABLET | Freq: Three times a day (TID) | ORAL | Status: DC | PRN
Start: 2017-01-19 — End: 2017-01-20
  Administered 2017-01-19 – 2017-01-20 (×2): 5 mg via ORAL
  Filled 2017-01-19: qty 1

## 2017-01-19 MED ORDER — CEFAZOLIN SODIUM-DEXTROSE 2-4 GM/100ML-% IV SOLN
2.0000 g | INTRAVENOUS | Status: AC
  Administered 2017-01-19: 2 g via INTRAVENOUS

## 2017-01-19 MED ORDER — MUPIROCIN 2 % EX OINT
1.0000 "application " | TOPICAL_OINTMENT | Freq: Three times a day (TID) | CUTANEOUS | Status: DC
  Administered 2017-01-19: 1 via TOPICAL

## 2017-01-19 MED ORDER — MIDAZOLAM HCL 2 MG/2ML IJ SOLN
1.0000 mg | INTRAMUSCULAR | Status: DC | PRN
  Administered 2017-01-19: 2 mg via INTRAVENOUS

## 2017-01-19 MED ORDER — MORPHINE SULFATE (PF) 4 MG/ML IV SOLN
INTRAVENOUS | Status: AC
  Filled 2017-01-19: qty 1

## 2017-01-19 MED ORDER — SCOPOLAMINE 1 MG/3DAYS TD PT72
1.0000 | MEDICATED_PATCH | Freq: Once | TRANSDERMAL | Status: AC | PRN
  Administered 2017-01-19: 1.5 mg via TRANSDERMAL
  Administered 2017-01-19: 1 via TRANSDERMAL

## 2017-01-19 MED ORDER — SODIUM CHLORIDE FLUSH 0.9 % IV SOLN
INTRAVENOUS | Status: AC
  Filled 2017-01-19: qty 10

## 2017-01-19 MED ORDER — ONDANSETRON HCL 4 MG/2ML IJ SOLN
INTRAMUSCULAR | Status: AC
  Filled 2017-01-19: qty 2

## 2017-01-19 MED ORDER — ARTIFICIAL TEARS OP OINT
TOPICAL_OINTMENT | OPHTHALMIC | Status: AC
  Filled 2017-01-19: qty 3.5

## 2017-01-19 MED ORDER — CALCIUM CARBONATE 600 MG PO TABS: 600.0000 mg | ORAL_TABLET | Freq: Two times a day (BID) | ORAL | Status: DC

## 2017-01-19 MED ORDER — DIPHENHYDRAMINE HCL 25 MG PO CAPS: 25.0000 mg | ORAL_CAPSULE | Freq: Four times a day (QID) | ORAL | Status: DC | PRN

## 2017-01-19 MED ORDER — OXYCODONE HCL 5 MG PO TABS
5.0000 mg | ORAL_TABLET | ORAL | Status: DC | PRN
  Administered 2017-01-19 – 2017-01-20 (×4): 5 mg via ORAL
  Filled 2017-01-19 (×4): qty 1

## 2017-01-19 MED ORDER — METHYLENE BLUE 0.5 % INJ SOLN
INTRAVENOUS | Status: AC
  Filled 2017-01-19: qty 10

## 2017-01-19 MED ORDER — ONDANSETRON HCL 4 MG PO TABS
4.0000 mg | ORAL_TABLET | ORAL | Status: DC | PRN
  Administered 2017-01-20: 4 mg via ORAL

## 2017-01-19 MED ORDER — DEXAMETHASONE SODIUM PHOSPHATE 10 MG/ML IJ SOLN
INTRAMUSCULAR | Status: AC
  Filled 2017-01-19: qty 1

## 2017-01-19 MED ORDER — BACITRACIN ZINC 500 UNIT/GM EX OINT
TOPICAL_OINTMENT | CUTANEOUS | Status: AC
  Filled 2017-01-19: qty 28.35

## 2017-01-19 MED ORDER — BACITRACIN ZINC 500 UNIT/GM EX OINT
TOPICAL_OINTMENT | CUTANEOUS | Status: DC | PRN
  Administered 2017-01-19: 1 via TOPICAL

## 2017-01-19 MED ORDER — DEXAMETHASONE SODIUM PHOSPHATE 10 MG/ML IJ SOLN
6.0000 mg | Freq: Once | INTRAMUSCULAR | Status: AC
  Administered 2017-01-19: 6 mg via INTRAVENOUS
  Filled 2017-01-19: qty 1

## 2017-01-19 MED ORDER — SODIUM CHLORIDE 0.9 % IR SOLN
Status: DC | PRN
  Administered 2017-01-19: 500 mL

## 2017-01-19 MED ORDER — CEFAZOLIN SODIUM-DEXTROSE 2-4 GM/100ML-% IV SOLN
INTRAVENOUS | Status: AC
  Filled 2017-01-19: qty 100

## 2017-01-19 MED ORDER — DIPHENHYDRAMINE HCL 50 MG/ML IJ SOLN: 25.0000 mg | Freq: Four times a day (QID) | INTRAMUSCULAR | Status: DC | PRN

## 2017-01-19 MED ORDER — CIPROFLOXACIN-HYDROCORTISONE 0.2-1 % OT SUSP
OTIC | Status: AC
  Filled 2017-01-19: qty 20

## 2017-01-19 MED ORDER — PROPOFOL 500 MG/50ML IV EMUL
INTRAVENOUS | Status: DC | PRN
  Administered 2017-01-19: 150 ug/kg/min via INTRAVENOUS

## 2017-01-19 MED ORDER — LIDOCAINE-EPINEPHRINE 2 %-1:100000 IJ SOLN
INTRAMUSCULAR | Status: DC | PRN
  Administered 2017-01-19: 4.2 mL

## 2017-01-19 MED ORDER — SCOPOLAMINE 1 MG/3DAYS TD PT72
MEDICATED_PATCH | TRANSDERMAL | Status: AC
  Filled 2017-01-19: qty 1

## 2017-01-19 MED ORDER — PROPOFOL 10 MG/ML IV BOLUS
INTRAVENOUS | Status: AC
  Filled 2017-01-19: qty 20

## 2017-01-19 MED ORDER — HYDROMORPHONE HCL 1 MG/ML IJ SOLN
INTRAMUSCULAR | Status: AC
  Filled 2017-01-19: qty 1

## 2017-01-19 MED ORDER — FENTANYL CITRATE (PF) 100 MCG/2ML IJ SOLN
INTRAMUSCULAR | Status: AC
  Filled 2017-01-19: qty 2

## 2017-01-19 MED ORDER — MORPHINE SULFATE (PF) 2 MG/ML IV SOLN
2.0000 mg | INTRAVENOUS | Status: DC | PRN
  Administered 2017-01-19: 4 mg via INTRAVENOUS
  Administered 2017-01-19: 2 mg via INTRAVENOUS
  Administered 2017-01-19: 4 mg via INTRAVENOUS
  Administered 2017-01-20: 2 mg via INTRAVENOUS

## 2017-01-19 MED ORDER — ONDANSETRON HCL 4 MG/2ML IJ SOLN
4.0000 mg | INTRAMUSCULAR | Status: AC
  Administered 2017-01-19 (×2): 4 mg via INTRAVENOUS

## 2017-01-19 MED ORDER — LIDOCAINE-EPINEPHRINE 2 %-1:100000 IJ SOLN
INTRAMUSCULAR | Status: AC
  Filled 2017-01-19: qty 6.8

## 2017-01-19 MED ORDER — PROPOFOL 10 MG/ML IV BOLUS
INTRAVENOUS | Status: DC | PRN
  Administered 2017-01-19: 110 mg via INTRAVENOUS

## 2017-01-19 MED ORDER — CIPROFLOXACIN-HYDROCORTISONE 0.2-1 % OT SUSP
OTIC | Status: DC | PRN
  Administered 2017-01-19: 3 [drp] via OTIC

## 2017-01-19 MED ORDER — SUCCINYLCHOLINE CHLORIDE 200 MG/10ML IV SOSY
PREFILLED_SYRINGE | INTRAVENOUS | Status: DC | PRN
  Administered 2017-01-19: 40 mg via INTRAVENOUS

## 2017-01-19 MED ORDER — ONDANSETRON HCL 4 MG/2ML IJ SOLN
4.0000 mg | INTRAMUSCULAR | Status: DC | PRN
  Administered 2017-01-19: 4 mg via INTRAVENOUS
  Filled 2017-01-19: qty 2

## 2017-01-19 MED ORDER — LIDOCAINE 2% (20 MG/ML) 5 ML SYRINGE
INTRAMUSCULAR | Status: AC
  Filled 2017-01-19: qty 5

## 2017-01-19 MED ORDER — IBUPROFEN 800 MG PO TABS
800.0000 mg | ORAL_TABLET | Freq: Four times a day (QID) | ORAL | Status: DC | PRN
  Administered 2017-01-19 – 2017-01-20 (×3): 800 mg via ORAL
  Filled 2017-01-19: qty 4

## 2017-01-19 MED ORDER — SODIUM CHLORIDE 0.9 % IV SOLN
0.0125 ug/kg/min | INTRAVENOUS | Status: DC
  Filled 2017-01-19: qty 2000

## 2017-01-19 MED ORDER — FENTANYL CITRATE (PF) 100 MCG/2ML IJ SOLN
50.0000 ug | INTRAMUSCULAR | Status: DC | PRN
  Administered 2017-01-19: 25 ug via INTRAVENOUS
  Administered 2017-01-19: 50 ug via INTRAVENOUS

## 2017-01-19 MED ORDER — GLYCOPYRROLATE 0.2 MG/ML IV SOSY
PREFILLED_SYRINGE | INTRAVENOUS | Status: DC | PRN
  Administered 2017-01-19: .2 mg via INTRAVENOUS

## 2017-01-19 MED ORDER — ZOLPIDEM TARTRATE 5 MG PO TABS
5.0000 mg | ORAL_TABLET | Freq: Every evening | ORAL | Status: DC | PRN
Start: 2017-01-19 — End: 2017-01-20

## 2017-01-19 MED ORDER — PROGESTERONE MICRONIZED 100 MG PO CAPS
100.0000 mg | ORAL_CAPSULE | Freq: Every day | ORAL | Status: DC
  Administered 2017-01-19: 100 mg via ORAL

## 2017-01-19 MED ORDER — PROMETHAZINE HCL 25 MG/ML IJ SOLN: 6.2500 mg | INTRAMUSCULAR | Status: DC | PRN

## 2017-01-19 MED ORDER — PHENYLEPHRINE HCL 10 MG/ML IJ SOLN
INTRAVENOUS | Status: DC | PRN
  Administered 2017-01-19: 50 ug/min via INTRAVENOUS

## 2017-01-19 MED ORDER — PROMETHAZINE HCL 25 MG/ML IJ SOLN
6.2500 mg | Freq: Four times a day (QID) | INTRAMUSCULAR | Status: DC | PRN
  Administered 2017-01-19: 6.25 mg via INTRAVENOUS
  Filled 2017-01-19: qty 1

## 2017-01-19 MED ORDER — DEXAMETHASONE SODIUM PHOSPHATE 4 MG/ML IJ SOLN
10.0000 mg | INTRAMUSCULAR | Status: AC
  Administered 2017-01-19: 10 mg via INTRAVENOUS

## 2017-01-19 MED ORDER — LACTATED RINGERS IV SOLN
INTRAVENOUS | Status: DC
  Administered 2017-01-19 (×2): via INTRAVENOUS

## 2017-01-19 MED ORDER — DEXTROSE IN LACTATED RINGERS 5 % IV SOLN
INTRAVENOUS | Status: DC
  Administered 2017-01-19 (×2): via INTRAVENOUS

## 2017-01-19 MED ORDER — LIDOCAINE HCL 2 % IJ SOLN
INTRAMUSCULAR | Status: DC | PRN
  Administered 2017-01-19: .55 mL

## 2017-01-19 MED ORDER — LIDOCAINE 2% (20 MG/ML) 5 ML SYRINGE
INTRAMUSCULAR | Status: DC | PRN
  Administered 2017-01-19: 60 mg via INTRAVENOUS

## 2017-01-19 MED ORDER — SUCCINYLCHOLINE CHLORIDE 200 MG/10ML IV SOSY
PREFILLED_SYRINGE | INTRAVENOUS | Status: AC
  Filled 2017-01-19: qty 10

## 2017-01-19 MED ORDER — ESTRADIOL 1 MG PO TABS
1.0000 mg | ORAL_TABLET | Freq: Every day | ORAL | Status: DC
  Administered 2017-01-19: 1 mg via ORAL

## 2017-01-19 MED ORDER — LIDOCAINE HCL 2 % IJ SOLN
INTRAMUSCULAR | Status: AC
  Filled 2017-01-19: qty 20

## 2017-01-19 MED ORDER — EPINEPHRINE PF 1 MG/ML IJ SOLN
INTRAMUSCULAR | Status: DC | PRN
  Administered 2017-01-19: .25 mL

## 2017-01-19 MED ORDER — SODIUM CHLORIDE 0.9 % IV SOLN
0.0125 ug/kg/min | INTRAVENOUS | Status: AC
  Administered 2017-01-19: .1 ug/kg/min via INTRAVENOUS
  Filled 2017-01-19: qty 2000

## 2017-01-19 MED ORDER — MUPIROCIN 2 % EX OINT
TOPICAL_OINTMENT | CUTANEOUS | Status: AC
  Filled 2017-01-19: qty 22

## 2017-01-19 MED ORDER — BUPIVACAINE-EPINEPHRINE (PF) 0.5% -1:200000 IJ SOLN
INTRAMUSCULAR | Status: AC
  Filled 2017-01-19: qty 30

## 2017-01-19 MED ORDER — EPINEPHRINE 30 MG/30ML IJ SOLN
INTRAMUSCULAR | Status: AC
  Filled 2017-01-19: qty 1

## 2017-01-19 SURGICAL SUPPLY — 74 items
ATTRACTOMAT 16X20 MAGNETIC DRP (DRAPES) IMPLANT
BAG DECANTER FOR FLEXI CONT (MISCELLANEOUS) IMPLANT
BAG URINE DRAINAGE (UROLOGICAL SUPPLIES) IMPLANT
BENZOIN TINCTURE PRP APPL 2/3 (GAUZE/BANDAGES/DRESSINGS) IMPLANT
BLADE CLIPPER SURG (BLADE) ×3 IMPLANT
BLADE NEEDLE 3 SS STRL (BLADE) ×3 IMPLANT
BUR ROUNG CUTTER 5.0 (BUR) ×3 IMPLANT
BUR SURG RND 4.0 COARSE DIAM (BURR) ×3 IMPLANT
CANISTER SUCT 1200ML W/VALVE (MISCELLANEOUS) ×3 IMPLANT
CATH FOLEY 2WAY SLVR  5CC 14FR (CATHETERS)
CATH FOLEY 2WAY SLVR 5CC 14FR (CATHETERS) IMPLANT
CORDS BIPOLAR (ELECTRODE) ×3 IMPLANT
COTTONBALL LRG STERILE PKG (GAUZE/BANDAGES/DRESSINGS) ×3 IMPLANT
DECANTER SPIKE VIAL GLASS SM (MISCELLANEOUS) IMPLANT
DEPRESSOR TONGUE BLADE STERILE (MISCELLANEOUS) IMPLANT
DRAIN PENROSE 1/2X12 LTX STRL (WOUND CARE) IMPLANT
DRAIN PENROSE 1/4X12 LTX STRL (WOUND CARE) IMPLANT
DRAPE CONTRAVES MICR (DRAPES) IMPLANT
DRAPE INCISE IOBAN 66X45 STRL (DRAPES) ×3 IMPLANT
DRAPE MICROSCOPE WILD 40.5X102 (DRAPES) ×3 IMPLANT
DRAPE SURG 17X23 STRL (DRAPES) ×3 IMPLANT
DRSG EMULSION OIL 3X3 NADH (GAUZE/BANDAGES/DRESSINGS) IMPLANT
DRSG GLASSCOCK MASTOID ADT (GAUZE/BANDAGES/DRESSINGS) ×3 IMPLANT
DRSG GLASSCOCK MASTOID PED (GAUZE/BANDAGES/DRESSINGS) IMPLANT
ELECT COATED BLADE 2.86 ST (ELECTRODE) ×3 IMPLANT
ELECT PAIRED SUBDERMAL (MISCELLANEOUS) ×3
ELECT REM PT RETURN 9FT ADLT (ELECTROSURGICAL) ×3
ELECTRODE PAIRED SUBDERMAL (MISCELLANEOUS) ×2 IMPLANT
ELECTRODE REM PT RTRN 9FT ADLT (ELECTROSURGICAL) ×2 IMPLANT
GAUZE PACKING IODOFORM 1/4X15 (GAUZE/BANDAGES/DRESSINGS) ×3 IMPLANT
GAUZE SPONGE 4X4 12PLY STRL (GAUZE/BANDAGES/DRESSINGS) IMPLANT
GAUZE SPONGE 4X4 16PLY XRAY LF (GAUZE/BANDAGES/DRESSINGS) ×3 IMPLANT
GLOVE BIOGEL PI IND STRL 7.0 (GLOVE) ×6 IMPLANT
GLOVE BIOGEL PI INDICATOR 7.0 (GLOVE) ×3
GLOVE ECLIPSE 7.5 STRL STRAW (GLOVE) ×3 IMPLANT
GLOVE SURG SS PI 7.0 STRL IVOR (GLOVE) ×3 IMPLANT
GOWN STRL REUS W/ TWL LRG LVL3 (GOWN DISPOSABLE) ×2 IMPLANT
GOWN STRL REUS W/ TWL XL LVL3 (GOWN DISPOSABLE) ×2 IMPLANT
GOWN STRL REUS W/TWL LRG LVL3 (GOWN DISPOSABLE) ×1
GOWN STRL REUS W/TWL XL LVL3 (GOWN DISPOSABLE) ×1
IV NS 500ML (IV SOLUTION) ×1
IV NS 500ML BAXH (IV SOLUTION) ×2 IMPLANT
IV SET EXT 30 76VOL 4 MALE LL (IV SETS) ×3 IMPLANT
NDL SAFETY ECLIPSE 18X1.5 (NEEDLE) ×4 IMPLANT
NEEDLE DENTAL 27 LONG (NEEDLE) ×3 IMPLANT
NEEDLE HYPO 18GX1.5 BLUNT FILL (NEEDLE) ×3 IMPLANT
NEEDLE HYPO 18GX1.5 SHARP (NEEDLE) ×2
NEEDLE HYPO 25X1 1.5 SAFETY (NEEDLE) IMPLANT
NEEDLE PRECISIONGLIDE 27X1.5 (NEEDLE) ×6 IMPLANT
NS IRRIG 1000ML POUR BTL (IV SOLUTION) ×3 IMPLANT
PACK BASIN DAY SURGERY FS (CUSTOM PROCEDURE TRAY) ×3 IMPLANT
PACK ENT DAY SURGERY (CUSTOM PROCEDURE TRAY) ×3 IMPLANT
PENCIL BUTTON HOLSTER BLD 10FT (ELECTRODE) ×3 IMPLANT
PROBE NERVBE PRASS .33 (MISCELLANEOUS) ×3 IMPLANT
SET IRRIG Y TYPE TUR BLADDER L (SET/KITS/TRAYS/PACK) ×3 IMPLANT
SLEEVE SCD COMPRESS KNEE MED (MISCELLANEOUS) IMPLANT
SPONGE GAUZE 4X4 12PLY STER LF (GAUZE/BANDAGES/DRESSINGS) IMPLANT
SPONGE SURGIFOAM ABS GEL 12-7 (HEMOSTASIS) ×3 IMPLANT
STRIP CLOSURE SKIN 1/2X4 (GAUZE/BANDAGES/DRESSINGS) IMPLANT
SUT BONE WAX W31G (SUTURE) IMPLANT
SUT CHROMIC 3 0 PS 2 (SUTURE) ×3 IMPLANT
SUT CHROMIC 4 0 P 3 18 (SUTURE) IMPLANT
SUT MNCRL AB 3-0 PS2 18 (SUTURE) IMPLANT
SUT NOVAFIL 5 0 BLK 18 IN P13 (SUTURE) ×3 IMPLANT
SUT SILK 4 0 P 3 (SUTURE) IMPLANT
SYR 3ML 18GX1 1/2 (SYRINGE) IMPLANT
SYR 5ML LL (SYRINGE) IMPLANT
SYR BULB 3OZ (MISCELLANEOUS) ×3 IMPLANT
SYR TB 1ML LL NO SAFETY (SYRINGE) ×6 IMPLANT
TOWEL OR 17X24 6PK STRL BLUE (TOWEL DISPOSABLE) ×3 IMPLANT
TRAY DSU PREP LF (CUSTOM PROCEDURE TRAY) ×3 IMPLANT
TRAY FOLEY BAG SILVER LF 14FR (SET/KITS/TRAYS/PACK) ×3 IMPLANT
TRAY FOLEY BAG SILVER LF 16FR (SET/KITS/TRAYS/PACK) IMPLANT
TUBING IRRIGATION (MISCELLANEOUS) ×3 IMPLANT

## 2017-01-19 NOTE — Anesthesia Procedure Notes (Signed)
Procedure Name: Intubation Date/Time: 01/19/2017 8:52 AM Performed by: Lyndee Leo Pre-anesthesia Checklist: Patient identified, Emergency Drugs available, Suction available and Patient being monitored Patient Re-evaluated:Patient Re-evaluated prior to inductionOxygen Delivery Method: Circle system utilized Preoxygenation: Pre-oxygenation with 100% oxygen Intubation Type: IV induction Ventilation: Mask ventilation without difficulty Laryngoscope Size: Mac and 3 Tube type: Oral Rae Tube size: 7.0 mm Number of attempts: 1 Airway Equipment and Method: Stylet and Oral airway Placement Confirmation: ETT inserted through vocal cords under direct vision,  positive ETCO2 and breath sounds checked- equal and bilateral Secured at: 21 cm Tube secured with: Tape Dental Injury: Teeth and Oropharynx as per pre-operative assessment

## 2017-01-19 NOTE — Brief Op Note (Signed)
01/19/2017  12:10 PM  PATIENT:  Christine Brady  56 y.o. female  PRE-OPERATIVE DIAGNOSIS:  RECURRENT CHOLESTEATOMA RIGHT MASTOID & ATTIC  POST-OPERATIVE DIAGNOSIS:  RECURRENT CHOLESTEATOMA RIGHT MASTOID & ATTIC  PROCEDURE:  Procedure(s): RIGHT MODIFIED RADICAL MASTOIDECTOMY (Right) - [place] Modified Radical Mastoidectomy  SURGEON:  Surgeon(s) and Role:    * Vicie Mutters, MD - Primary  PHYSICIAN ASSISTANT:   ASSISTANTS: none   ANESTHESIA:   general  EBL:  Total I/O In: 1550 [I.V.:1550] Out: 1030 [Urine:1000; Blood:30]  BLOOD ADMINISTERED:none  DRAINS: none   LOCAL MEDICATIONS USED:  XYLOCAINE   SPECIMEN:  Source of Specimen:  R mastoid & attic cholesteatoma  DISPOSITION OF SPECIMEN:  PATHOLOGY  COUNTS:  YES  TOURNIQUET:  * No tourniquets in log *  DICTATION: .Other Dictation: Dictation Number [id number]  PLAN OF CARE: Admit for overnight observation  PATIENT DISPOSITION:  PACU - hemodynamically stable.   Delay start of Pharmacological VTE agent (>24hrs) due to surgical blood loss or risk of bleeding: not applicable

## 2017-01-19 NOTE — Anesthesia Preprocedure Evaluation (Addendum)
Anesthesia Evaluation  Patient identified by MRN, date of birth, ID band Patient awake    Reviewed: Allergy & Precautions, NPO status , Patient's Chart, lab work & pertinent test results  History of Anesthesia Complications (+) PONV  Airway Mallampati: II  TM Distance: >3 FB Neck ROM: Full    Dental  (+) Dental Advisory Given   Pulmonary neg pulmonary ROS,    breath sounds clear to auscultation       Cardiovascular negative cardio ROS   Rhythm:Regular Rate:Normal     Neuro/Psych  Headaches,    GI/Hepatic negative GI ROS, Neg liver ROS,   Endo/Other  negative endocrine ROS  Renal/GU negative Renal ROS     Musculoskeletal  (+) Arthritis ,   Abdominal   Peds  Hematology  (+) Blood dyscrasia (Hx of ITP), ,   Anesthesia Other Findings   Reproductive/Obstetrics                             Anesthesia Physical Anesthesia Plan  ASA: II  Anesthesia Plan: General   Post-op Pain Management:    Induction: Intravenous  Airway Management Planned: Oral ETT  Additional Equipment:   Intra-op Plan:   Post-operative Plan: Extubation in OR  Informed Consent: I have reviewed the patients History and Physical, chart, labs and discussed the procedure including the risks, benefits and alternatives for the proposed anesthesia with the patient or authorized representative who has indicated his/her understanding and acceptance.   Dental advisory given  Plan Discussed with:   Anesthesia Plan Comments: (Severe PONV with previous anesthetics. Plan for TIVA with propofol and remifentanil.)       Anesthesia Quick Evaluation

## 2017-01-19 NOTE — Anesthesia Postprocedure Evaluation (Signed)
Anesthesia Post Note  Patient: Christine Brady  Procedure(s) Performed: Procedure(s) (LRB): RIGHT MODIFIED RADICAL MASTOIDECTOMY (Right)  Patient location during evaluation: PACU Anesthesia Type: General Level of consciousness: awake and alert Pain management: pain level controlled Vital Signs Assessment: post-procedure vital signs reviewed and stable Respiratory status: spontaneous breathing, nonlabored ventilation, respiratory function stable and patient connected to nasal cannula oxygen Cardiovascular status: blood pressure returned to baseline and stable Postop Assessment: no signs of nausea or vomiting Anesthetic complications: no       Last Vitals:  Vitals:   01/19/17 1345 01/19/17 1400  BP: 136/86 111/69  Pulse: (!) 103 75  Resp: (!) 8 (!) 9  Temp:      Last Pain:  Vitals:   01/19/17 1400  TempSrc:   PainSc: 3                  Tiajuana Amass

## 2017-01-19 NOTE — Transfer of Care (Signed)
Immediate Anesthesia Transfer of Care Note  Patient: Christine Brady  Procedure(s) Performed: Procedure(s): RIGHT MODIFIED RADICAL MASTOIDECTOMY (Right)  Patient Location: PACU  Anesthesia Type:General  Level of Consciousness: sedated and patient cooperative  Airway & Oxygen Therapy: Patient Spontanous Breathing and Patient connected to face mask oxygen  Post-op Assessment: Report given to RN and Post -op Vital signs reviewed and stable  Post vital signs: Reviewed and stable  Last Vitals:  Vitals:   01/19/17 0726  BP: 116/64  Pulse: 66  Resp: 18  Temp: 36.8 C    Last Pain:  Vitals:   01/19/17 0726  TempSrc: Oral         Complications: No apparent anesthesia complications

## 2017-01-19 NOTE — Care Management (Signed)
S: Awake, alert, c/o pain, has not voided, some nausea but controlled with zofran IV, no reports of bleeding or vomiting  O: VS stable, R VIIth n intact, no nystagmus, dressing dry  A: 1. Stable postoperative course 2. Little nausea compared to past general anesthetics 3. Facial function intact and no nystagmus 4. Findings reviewed with the patient 5. Tolerating morphine IV and oxycodone despite history of "codeine" allergy 6. Has not voided  P: 1. Encourage voiding. If has not voided by 8pm, straight in & out catherization. 2. Continue oxycodone and morphine tonight for pain along with ibuprofen. 3. Head of bed elevated tonight 4. Plan DC in am if stable overnight.

## 2017-01-19 NOTE — Interval H&P Note (Signed)
History and Physical Interval Note:  01/19/2017 8:15 AM  Christine Brady  has presented today for surgery, with the diagnosis of RECURRENT CHOLESTEATOMA RIGHT EAR  The various methods of treatment have been discussed with the patient and family. After consideration of risks, benefits and other options for treatment, the patient has consented to  Procedure(s): REVISION RIGHT TYMPANOMASTOIDECTOMY WITH RECONSTRUCTION, POSSIBLE RIGHT OSSICULOPLASTY (Right) POSSIBLE RIGHT MODIFIED RADICAL MASTOIDECTOMY (Right) as a surgical intervention .  The patient's history has been reviewed, patient examined, no change in status, stable for surgery.  I have reviewed the patient's chart and labs.  Questions were answered to the patient's satisfaction.  Rosaleen denies any cough, fever, or upper/lower respiratory tract infection during the last 3 days.   Thornell Mule, Tekisha Darcey M

## 2017-01-20 ENCOUNTER — Encounter (HOSPITAL_BASED_OUTPATIENT_CLINIC_OR_DEPARTMENT_OTHER): Payer: Self-pay | Admitting: Otolaryngology

## 2017-01-20 DIAGNOSIS — H7201 Central perforation of tympanic membrane, right ear: Secondary | ICD-10-CM | POA: Diagnosis not present

## 2017-01-20 DIAGNOSIS — D693 Immune thrombocytopenic purpura: Secondary | ICD-10-CM | POA: Diagnosis not present

## 2017-01-20 DIAGNOSIS — E785 Hyperlipidemia, unspecified: Secondary | ICD-10-CM | POA: Diagnosis not present

## 2017-01-20 DIAGNOSIS — H900 Conductive hearing loss, bilateral: Secondary | ICD-10-CM | POA: Diagnosis not present

## 2017-01-20 DIAGNOSIS — H6983 Other specified disorders of Eustachian tube, bilateral: Secondary | ICD-10-CM | POA: Diagnosis not present

## 2017-01-20 DIAGNOSIS — H7121 Cholesteatoma of mastoid, right ear: Secondary | ICD-10-CM | POA: Diagnosis not present

## 2017-01-20 DIAGNOSIS — H7101 Cholesteatoma of attic, right ear: Secondary | ICD-10-CM | POA: Diagnosis not present

## 2017-01-20 DIAGNOSIS — I10 Essential (primary) hypertension: Secondary | ICD-10-CM | POA: Diagnosis not present

## 2017-01-20 DIAGNOSIS — H6041 Cholesteatoma of right external ear: Secondary | ICD-10-CM | POA: Diagnosis not present

## 2017-01-20 MED ORDER — MORPHINE SULFATE (PF) 4 MG/ML IV SOLN
INTRAVENOUS | Status: AC
  Filled 2017-01-20: qty 1

## 2017-01-20 MED ORDER — DIAZEPAM 5 MG PO TABS: 5.0000 mg | ORAL_TABLET | Freq: Three times a day (TID) | ORAL | 0 refills | Status: DC | PRN

## 2017-01-20 MED ORDER — MUPIROCIN 2 % EX OINT: 1.0000 "application " | TOPICAL_OINTMENT | Freq: Three times a day (TID) | CUTANEOUS | 0 refills | Status: DC

## 2017-01-20 MED FILL — HYDROCODON-APAP 5-325: 5-325 | 3 days supply | Qty: 20 | Fill #0

## 2017-01-20 MED FILL — ONDANSETRON ODT 4 MG TABLET: 4 | 2 days supply | Qty: 10 | Fill #0

## 2017-01-20 MED FILL — IBUPROFEN 800 MG TABLET: 800 | 13 days supply | Qty: 40 | Fill #0

## 2017-01-20 NOTE — Discharge Instructions (Signed)
1. Elevate head of bed 30 degrees for the next 5 nights. 2. Keep right postauricular incision dry for the next week. 3. Follow your regular diet at home. 4. Apply mupirocin ointment to incision line twice/day for one week. 5. Medications:              1. Cefzil 500 mg PO BID x 10 days             2. Vicodin 5/300 1-2 PO Q4hrs prn pain (#20)             3. Ciprodex drops 3 gtts AD TId x 1 wk.             4. Mupirocin ointment apply to incision BID x 1wk.             5. Valium 5mg  PO Q8hrs. Prn vertigo (#20)   6. Quiet indoor activity for the next week. 7. Follow the instructions on the yellow instruction sheet given to you by Dr. Thornell Mule 8. Call [phone] for any questions or problems directly related to your operation. 9. Return to see Dr. Thornell Mule on 01-26-17 at his office for follow up and suture removal.

## 2017-01-20 NOTE — Discharge Summary (Signed)
Patient ID: Christine Brady MRN: [id number] DOB/AGE: [birth year] 56 y.o.  Admit date: 01/19/2017 Discharge date: 01/20/2017  Admission Diagnoses: 1. Recurrent right mastoid and attic cholesteatoma, 2. Moderate conductive hearing loss AD  Discharge Diagnoses: same Active Problems:   History of postoperative nausea   Complications None  Discharged Condition: stable  Hospital Course: Taken to [place] #6 on 01-19-17 and underwent an uncomplicated revision R Bondy modified radical mastoidectomy. The entire mastoid and attic were filled with cholesteatoma. Ossicles were intact and mobile. Facial nerve was exposed by the cholesteatoma in the vertical descending portion of the facial canal. Dura was exposed along the tegmen mastoideum but without herniation. An inferior 20% perforation was closed with a medial fascia graft. A Paparella tube was inserted. No complications. Recovered in PACU after TIVA anesthesia. Some mild nausea overnight. Incision stable on 01-20-17. [place] home in stable condition on morning of 01-20-17 with her sister, [name].  Consults: None  Significant Diagnostic Studies: none  Treatments: surgery: see note above under hospital course  Discharge Exam: Blood pressure (!) 99/59, pulse (!) 56, temperature 97.3 F (36.3 C), resp. rate 16, height 5' 2.5" (1.588 m), weight 64.5 kg (142 lb 3.2 oz), SpO2 99 %. Incision stable on 01-20-17. VIIth intact, no nystagmus  Disposition: 01-Home or Self Care  Diet Regular as tolerated  Room Locations Preop, [facility] OR Rm #6, [facility] rm #3  Activity Quiet indoor activity x 1 wk. Return to work on 02-07-17      Allergies as of 01/20/2017      Reactions   Claritin-d [loratadine-pseudoephedrine Er] Rash   Codeine Rash    Med Rec must be completed prior to using this [facility]     Signed: Thornell Mule, Mayar Whittier M 01/20/2017, 7:53 AM Patient ID: Christine Brady MRN: [id number] DOB/AGE: [birth year] 56 y.o.  Admit date:  01/19/2017 Discharge date: 01/20/2017  Admission Diagnoses: 1. Recurrent right mastoid and attic cholesteatoma, 2. Moderate conductive hearing loss AD  Discharge Diagnoses: same Active Problems:   History of postoperative nausea   Complications None  Discharged Condition: stable  Hospital Course: Taken to [facility] #6 on 01-19-17 and underwent an uncomplicated revision R Bondy modified radical mastoidectomy. The entire mastoid and attic were filled with cholesteatoma. Ossicles were intact and mobile. Facial nerve was exposed by the cholesteatoma in the vertical descending portion of the facial canal. Dura was exposed along the tegmen mastoideum but without herniation. An inferior 20% perforation was closed with a medial fascia graft. A Paparella tube was inserted. No complications. Recovered in PACU after TIVA anesthesia. Some mild nausea overnight. Incision stable on 01-20-17. [place] home in stable condition on morning of 01-20-17 with her sister, [name].  Consults: None  Significant Diagnostic Studies: none  Treatments: surgery: see note above under hospital course  Discharge Exam: Blood pressure (!) 99/59, pulse (!) 56, temperature 97.3 F (36.3 C), resp. rate 16, height 5' 2.5" (1.588 m), weight 64.5 kg (142 lb 3.2 oz), SpO2 99 %. Incision stable on 01-20-17. VIIth intact, no nystagmus  Disposition: 01-Home or Self Care  Diet Regular as tolerated  Room Locations Preop, [facility] OR Rm #6, [facility] rm #3  Activity Quiet indoor activity x 1 wk. Return to work on 02-07-17  DC meds:             1. Cefzil 500 mg PO BID x 10 days             2. Vicodin 5/300 1-2 PO Q4hrs prn pain (#20)  3. Ciprodex drops 3 gtts AD TId x 1 wk.             4. Mupirocin ointment apply to incision BID x 1wk.             5. Valium 5mg  PO Q8hrs. Prn vertigo (#20)       Allergies as of 01/20/2017      Reactions   Claritin-d [loratadine-pseudoephedrine Er] Rash   Codeine Rash    Med Rec must be  completed prior to using this [facility]     Signed: Shamond Skelton M 01/20/2017, 7:53 AM

## 2017-01-20 NOTE — Discharge Summary (Signed)
Physician Discharge Summary  Patient ID: Christine Brady MRN: [id number] DOB/AGE: 1961/12/06 56 y.o.  Admit date: 01/19/2017 Discharge date: 01/20/2017  Admission Diagnoses: 1. Recurrent right mastoid and attic cholesteatoma, 2. Moderate conductive hearing loss AD  Discharge Diagnoses: same Active Problems:   History of postoperative nausea   Complications None  Discharged Condition: stable  Hospital Course: Taken to [facility] #6 on 01-19-17 and underwent an uncomplicated revision R Bondy modified radical mastoidectomy. The entire mastoid and attic were filled with cholesteatoma. Ossicles were intact and mobile. Facial nerve was exposed by the cholesteatoma in the vertical descending portion of the facial canal. Dura was exposed along the tegmen mastoideum but without herniation. An inferior 20% perforation was closed with a medial fascia graft. A Paparella tube was inserted. No complications. Recovered in PACU after TIVA anesthesia. Some mild nausea overnight. Incision stable on 01-20-17. [place] home in stable condition on morning of 01-20-17 with her sister, [name].  Consults: None  Significant Diagnostic Studies: none  Treatments: surgery: see note above under hospital course  Discharge Exam: Blood pressure (!) 99/59, pulse (!) 56, temperature 97.3 F (36.3 C), resp. rate 16, height 5' 2.5" (1.588 m), weight 64.5 kg (142 lb 3.2 oz), SpO2 99 %. Incision stable on 01-20-17. VIIth intact, no nystagmus  Disposition: 01-Home or Self Care  Diet Regular as tolerated  Room Locations Preop, [facility] OR Rm #6, [facility] rm #3  Activity Quiet indoor activity x 1 wk. Return to work on 02-07-17     Signed: Thornell Mule, Shaquaya Wuellner M 01/20/2017, 7:53 AM

## 2017-01-20 NOTE — Op Note (Signed)
Christine Brady, Christine Brady NO.:  [id number]  MEDICAL RECORD NO.:  [id number]  LOCATION:                                 FACILITY:  PHYSICIAN:  Fannie Knee, M.D.    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  The patient had undergone a right canal wall tympanomastoidectomy by myself in 2010. She was followed through 2014 and then was lost to follow up.  She recently presented with some pain in her left ear, but on physical examination was found to have a bulging cholesteatoma protruding through the posterior canal wall on the right.  CT scan of her temporal bones documented complete opacification of the right mastoid and partial opacification of the attic.  She was diagnosed as having recurrent right mastoid and attic and possibly middle ear cholesteatoma.  She was recommended for revision right canal wall up tympanomastoidectomy, possible modified radical mastoidectomy, possible ossiculoplasty.  Risks, complications, and alternatives of the procedure were explained to her. Questions were invited and answered.  Informed consent was signed and witnessed.  Preoperative teaching and counseling were provided.  Preoperative audiometric testing on January 05, 2017, documented bilateral conductive hearing losses, right greater than left with SRTs of 30 dB AD, 25 dB AS with 100% discrimination AU.  JUSTIFICATION FOR OUTPATIENT SETTINGS:  The patient's age and need for general endotracheal anesthesia.  JUSTIFICATION FOR OVERNIGHT STAY:  23 hours of observation, status post right modified radical  mastoidectomy.  PREOPERATIVE DIAGNOSIS:  Recurrent right mastoid and attic cholesteatoma.  POSTOPERATIVE DIAGNOSIS:  Recurrent right mastoid and attic cholesteatoma.  OPERATION:  Right Bondy modified radical mastoidectomy, UMT AD  SURGEON:  Fannie Knee, M.D.  ANESTHESIA:  General endotracheal TIVA, Dr. Suzette Battiest.  CRNA:  Johny Shock.  COMPLICATIONS:  None.  SUMMARY OF REPORT:  After the patient was taken to operating room #6 at [facility], she was placed in the supine position.  An IV had been begun in the holding area.   A general IV induction was then performed by Dr. Suzette Battiest.  The patient was orally intubated without difficulty by Johny Shock.  A TIVA type anesthesia was performed using propofol and remifentanil due to the patient's history of intense postoperative nausea and vomiting during previous general anesthetics.  She was properly positioned, monitored elbows and ankles were padded with foam rubber and a time-out was performed.  The patient was then turned 90 degrees counter-clockwise.  A small amount of hair was clipped in the right postauricular area.  Hair was Taped, and stockinette cap was applied.  The right postauricular incision was marked and infiltrated with 2.5 carpules of 2% Xylocaine with 1:100,000 epinephrine.  Silver wire needle electrodes were then inserted into the right orbicularis oculi muscle and suprasternal notch. The electrodes were connected to a NIM facial nerve monitor preamplifier. A Foley catheter  was inserted by the circulating nurse.  The patient's right ear and hemiface were then prepped with Betadine and draped in the standard fashion for a mastoidectomy.  Examination of the right ear using the OR microscope revealed a 5.5 mm diameter canal that was dilated to a 6 mm.  There was bulging of the medial posterior superior canal wall from cholesteatoma.  The tympanic membrane was retracted and  thickened.  There was a 20% central perforation inferiorly.  The earcanal was cleaned, and 4-quadrant ear canal blocks were performed with 1 mL of 2% Xylocaine with 1:50,000 epinephrine.  A right postauricular incision was then made and carried down through skin and subcutaneous tissue.  A T-shaped incision was made in the musculoperiosteal layer.  Dissection was carried toward the mastoid cavity.  Immediately upon entering the mastoid cavity, cholesteatoma was encountered.  The entire mastoid cavity was filled with cholesteatoma And completely filled the antrum.  There was exposed dura along the tegmen mastoideum, but no herniation of the dura or brain.  I dissected cholesteatoma off the exposed dura.  The mastoid cavity was then further opened with #5 cutting bur.  The sigmoid sinus was found to be displaced very laterally, and the mastoid cavity was found to be small and very constricted.  The posterior canal wall was thinned.  The cavity was then polished with a #4 Diamond bur using continuous suction irrigation.  There was exposed facial nerve in the midportion of the vertical descending portion. The nerve has been exposed by the cholesteatoma. The nerve stimulated at 0.4 milliamps throughout the procedure.  Cholesteatoma was then further removed from the antrum, and the antrum was further opened with a #4 Diamond drill.  I was able to identify the body of the incus and then the head of the malleus.  Cholesteatoma was removed from both ossicles. The disease was not medial to the incus or the malleus head.  Cholesteatoma was then removed from where it had eroded through the posterior canal wall.  More than half of the posterior canal wall had been eroded by the cholesteatoma. Therefore, a  decision was made to remove the remaining inferior portion of the posterior canal wall and create a Bondy modified radical mastoid cavity.  The inferior portion of the bony canal wall was then  removed with a #4 Diamond bur.  Bone was removed inferiorly to the level of the hypotympanum. The facial ridge was lowered.  Again, the facial nerve was exposed in the midportion of the vertical descending portion and stimulated at 0.4 milliamps.  The nerve had been exposed by the cholesteatoma.  Attention was then turned to the middle ear.  The soft tissue covering the ossicles was further removed.  The ossicles were found to be intact including the long process of the incus.  The stapes was intact and mobile. A large amount of fibrotic tissue was removed from the horizontal portion of the facial canal as well as from the oval window area.  Thick scar tissue was released between the distal handle of the malleus and the promontory.  She did have an air-containing middle ear space anteriorly.  A 5910 Beaver blade was used to remove the margin of a 20% inferior perforation.  Soft tissue was removed from the promontory.  The ear was copiously irrigated with bacitracin containing saline.  An anterior radial myringotomy incision was made, and a Paparella type 1 tube was inserted.  Areolar tissue and  temporalis fascia were then harvested from the  right supraauricular area.  The tissue was pressed between tongue blades and dried.  A standard meatoplasty was then performed using a 15 blade.  A disc of cartilage was removed, and a Koerner's flap was fashioned.  The flap was sutured back to the soft tissue with an interrupted 3-0 chromic.  The meatoplasty was made the diameter of my index finger.  Hemostasis was obtained.  The site was copiously irrigated with bacitracin containing saline.  A temporalis fascia graft was then placed medial to the tympanic membrane remnant inferiorly to close the inferior perforation.  Two discs of Gelfoam were used medial to the fascia graft to pack it against the TM.  Fascia was draped over the facial ridge and used to cover the exposed facial nerve.  A  second larger piece of thickened areolar tissue and fibrotic tissue was then used to line the posterior portion of the cavity and cover the exposed bone.  The tympanomeatal flap was draped over this tissue.  The medial portion of the cavity was then packed with Gelfoam discs soaked in Cipro HC.  Then, 2 large rectangles of Gelfoam were placed.  The cavity was then packed with a quarter-inch iodoform Nu Gauze impregnated with bacitracin Ointment. The meatoplasty was packed open.  The postauricular incision was then closed in 2 layers using interrupted inverted 3-0 chromic for subcutaneous layer. Skin was closed with a running locking 5-0 Novafil.  Bacitracin ointment was applied.  A bacitracin cotton ball was placed in the meatoplasty.  Silver wire needle electrodes were removed. The ear was padded with Telfa and cotton. A standard adult Glasscock mastoid dressing was applied loosely in the standard fashion.     Prior to the patient awakening, the Foley catheter was removed without Difficulty by the circulating nurse.  The patient was then awakened, extubated, and transferred to her hospital bed.  She appeared to tolerate both the general endotracheal TIVA anesthesia and the procedure well. She left the operating room in stable Condition and was transferred to the PACU.  TOTAL FLUIDS:  1500 mL.  TOTAL BLOOD LOSS:  30 mL.  TOTAL URINE OUTPUT:  1000 mL.  Sponge, needle, and cotton ball counts were correct at the termination of procedure.  Right mastoid cholesteatoma specimen was sent to pathology for documentation.  This was a copious amount of cholesteatoma.  The patient received the following intraoperative medications:   1. Ancef 2g IV, 2. Zofran 4 mg IV at the beginning and end of the procedure, 3. Decadron 10 mg IV.  Ms. Payan will be admitted to the PACU and will then be admitted to the [facility] for 23 hours for overnight observation.  If stable overnight, she will be  discharged on the morning of January 20, 2017, and will be instructed to return to my office on January 26, 2017, at 2:10 p.m.  DISCHARGE MEDICATIONS: 1. Cefzil 500 mg p.o. b.i.d. x10 days with food. 2. Motrin 800 mg p.o. t.i.d. p.r.n. pain. 3. Ciprodex drops 2 drops AD t.i.d. x1 week. 4. Mupirocin ointment applied to the postauricular incision b.i.d. x1     Week. 5. Vicodin 5/300 1-2 PO Q4hrs. Prn pain. (#20)  She is to follow regular diet, keep her head elevated, and avoid aspirin or aspirin products. She is to call [phone] for any postoperative problems directly related to the procedure.  She will be given both verbal and written instructions.  SUMMARY:  The patient was found to have extensive recurrent right mastoid and attic cholesteatoma.  The cholesteatoma was completely filling the mastoid cavity and had eroded the vertical descending portion of the facial canal with small amount of facial nerve exposed.  The nerve stimulated at 0.4 milliamps throughout the case.  There was exposed dura Along the tegmen mastoideum, but no herniation of either dura or brain into the mastoid. Cholesteatoma was dissected off the dura.  Ossicles were intact and mobile. A Paparella type 1 tube was inserted.  A medial type 1 fascia graft tympanoplasty was performed.  The procedure was a Bondy modified radical mastoidectomy with repair of the tympanic membrane perforation and excision of the cholesteatoma.  There were no complications. Sponge, needle, and cotton ball counts were correct at the termination of the operation.  Please send a copy to my office by fax at [phone] and transcribe the rest into her Epic chart at [facility].     Fannie Knee, M.D.   ______________________________ Fannie Knee, M.D.    EMK/MEDQ  D:  01/19/2017  T:  01/20/2017  Job:  [id number]

## 2017-01-20 NOTE — Care Management (Signed)
S: Restless night, required pain meds (MS), some nausea, feeling better this am, no vomiting  O: VS ok, BP and HR slightly down, awake and alert, no nystagmus, VII th.  Intact   Incision: stable and without hematoma, packing stable, dressing changed  A: 1. Stable post op course with little nausea, pain controlled. Able to tolerate morphine and oxycodone without a rash or other allergic reaction 2. Ok for DC today with her sister, Christine Brady: 1. DC this am with her sister, Christine Brady 2. Return to my office on 01-26-17 for follow up and suture removal 3. Regular diet 4. Quiet indoor activity. Not to return to work until 02-07-17. FMLA papers completed for her 5. DC meds:  1. Cefzil 500 mg PO BID x 10 days      2. Vicodin 5/300 1-2 PO Q4hrs prn pain (#20)  3. Ciprodex drops 3 gtts AD TId x 1 wk.  4. Mupirocin ointment apply to incision BID x 1wk.  5. Valium 5mg  PO Q8hrs. Prn vertigo (#20)  6. DC status = stable

## 2017-01-26 MED FILL — ONDANSETRON ODT 4 MG TABLET: 4 | 2 days supply | Qty: 10 | Fill #1

## 2017-01-26 MED FILL — diazePAM 5 MG TABS: 5 | 7 days supply | Qty: 20 | Fill #0

## 2017-02-24 MED FILL — ESTRADIOL 1 MG TABLET: 1 | 90 days supply | Qty: 90 | Fill #0

## 2017-02-24 MED FILL — PROGESTERONE 100 MG CAPSULE: 100 | 90 days supply | Qty: 90 | Fill #0

## 2017-03-09 DIAGNOSIS — Z1211 Encounter for screening for malignant neoplasm of colon: Secondary | ICD-10-CM | POA: Diagnosis not present

## 2017-03-09 DIAGNOSIS — Z01419 Encounter for gynecological examination (general) (routine) without abnormal findings: Secondary | ICD-10-CM | POA: Diagnosis not present

## 2017-03-09 DIAGNOSIS — Z1231 Encounter for screening mammogram for malignant neoplasm of breast: Secondary | ICD-10-CM | POA: Diagnosis not present

## 2017-03-09 DIAGNOSIS — N838 Other noninflammatory disorders of ovary, fallopian tube and broad ligament: Secondary | ICD-10-CM | POA: Diagnosis not present

## 2017-03-09 DIAGNOSIS — Z124 Encounter for screening for malignant neoplasm of cervix: Secondary | ICD-10-CM | POA: Diagnosis not present

## 2017-03-09 DIAGNOSIS — Z6825 Body mass index (BMI) 25.0-25.9, adult: Secondary | ICD-10-CM | POA: Diagnosis not present

## 2017-03-09 DIAGNOSIS — Z760 Encounter for issue of repeat prescription: Secondary | ICD-10-CM | POA: Diagnosis not present

## 2017-03-09 MED FILL — OMEPRAZOLE DR 40 MG CAPSULE: 40 | 90 days supply | Qty: 90 | Fill #1

## 2017-03-09 MED FILL — POLYETHYLENE GLYCOL 3350 PO: 90 days supply | Qty: 1581 | Fill #1

## 2017-03-28 DIAGNOSIS — N838 Other noninflammatory disorders of ovary, fallopian tube and broad ligament: Secondary | ICD-10-CM | POA: Diagnosis not present

## 2017-03-31 DIAGNOSIS — H6983 Other specified disorders of Eustachian tube, bilateral: Secondary | ICD-10-CM | POA: Diagnosis not present

## 2017-03-31 DIAGNOSIS — D693 Immune thrombocytopenic purpura: Secondary | ICD-10-CM | POA: Diagnosis not present

## 2017-03-31 DIAGNOSIS — M26621 Arthralgia of right temporomandibular joint: Secondary | ICD-10-CM | POA: Diagnosis not present

## 2017-03-31 DIAGNOSIS — H6041 Cholesteatoma of right external ear: Secondary | ICD-10-CM | POA: Diagnosis not present

## 2017-03-31 DIAGNOSIS — H7202 Central perforation of tympanic membrane, left ear: Secondary | ICD-10-CM | POA: Diagnosis not present

## 2017-03-31 DIAGNOSIS — H95191 Other disorders following mastoidectomy, right ear: Secondary | ICD-10-CM | POA: Diagnosis not present

## 2017-03-31 DIAGNOSIS — H900 Conductive hearing loss, bilateral: Secondary | ICD-10-CM | POA: Diagnosis not present

## 2017-05-03 ENCOUNTER — Emergency Department (HOSPITAL_COMMUNITY)
Admission: EM | Admit: 2017-05-03 | Discharge: 2017-05-03 | Disposition: A | Discharge status: Home / Self Care | Payer: No Typology Code available for payment source | Attending: Emergency Medicine | Admitting: Emergency Medicine

## 2017-05-03 DIAGNOSIS — Y9389 Activity, other specified: Secondary | ICD-10-CM | POA: Insufficient documentation

## 2017-05-03 DIAGNOSIS — S3992XA Unspecified injury of lower back, initial encounter: Secondary | ICD-10-CM | POA: Diagnosis present

## 2017-05-03 DIAGNOSIS — M7918 Myalgia, other site: Secondary | ICD-10-CM

## 2017-05-03 DIAGNOSIS — S39012A Strain of muscle, fascia and tendon of lower back, initial encounter: Secondary | ICD-10-CM | POA: Diagnosis not present

## 2017-05-03 DIAGNOSIS — M545 Low back pain, unspecified: Secondary | ICD-10-CM

## 2017-05-03 DIAGNOSIS — M791 Myalgia: Secondary | ICD-10-CM | POA: Diagnosis not present

## 2017-05-03 DIAGNOSIS — Z79899 Other long term (current) drug therapy: Secondary | ICD-10-CM | POA: Insufficient documentation

## 2017-05-03 DIAGNOSIS — Y92410 Unspecified street and highway as the place of occurrence of the external cause: Secondary | ICD-10-CM | POA: Diagnosis not present

## 2017-05-03 DIAGNOSIS — Y999 Unspecified external cause status: Secondary | ICD-10-CM | POA: Diagnosis not present

## 2017-05-03 MED ORDER — OXYCODONE-ACETAMINOPHEN 5-325 MG PO TABS
1.0000 | ORAL_TABLET | Freq: Once | ORAL | Status: AC
  Administered 2017-05-03: 1 via ORAL
  Filled 2017-05-03: qty 1

## 2017-05-03 MED ORDER — IBUPROFEN 200 MG PO TABS
600.0000 mg | ORAL_TABLET | Freq: Once | ORAL | Status: AC
Start: 2017-05-03 — End: 2017-05-03
  Administered 2017-05-03: 600 mg via ORAL
  Filled 2017-05-03: qty 3

## 2017-05-03 MED ORDER — CYCLOBENZAPRINE HCL 10 MG PO TABS: 10.0000 mg | ORAL_TABLET | Freq: Two times a day (BID) | ORAL | 0 refills | Status: DC | PRN

## 2017-05-03 MED ORDER — IBUPROFEN 600 MG PO TABS: 600.0000 mg | ORAL_TABLET | Freq: Four times a day (QID) | ORAL | 0 refills | Status: DC | PRN

## 2017-05-03 MED FILL — IBUPROFEN 600 MG TABLET: 600 | 8 days supply | Qty: 30 | Fill #0

## 2017-05-03 MED FILL — CYCLOBENZAPRINE 10 MG TAB: 10 | 10 days supply | Qty: 20 | Fill #0
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
## 2017-05-03 NOTE — ED Notes (Signed)
Bed: WTR6 Expected date:  Expected time:  Means of arrival:  Comments: 
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
## 2017-05-03 NOTE — ED Notes (Signed)
Bed: WTR8 Expected date:  Expected time:  Means of arrival:  Comments: 

## 2017-05-18 MED FILL — METAXALONE 800 MG TABLET: 800 | 30 days supply | Qty: 90 | Fill #0

## 2017-05-25 MED FILL — ESTRADIOL 1 MG TAB: 1 | 90 days supply | Qty: 90 | Fill #0

## 2017-05-25 MED FILL — PROGESTERONE 100 MG CAPSULE: 100 | 90 days supply | Qty: 90 | Fill #0

## 2017-06-16 DIAGNOSIS — D693 Immune thrombocytopenic purpura: Secondary | ICD-10-CM | POA: Diagnosis not present

## 2017-06-16 DIAGNOSIS — H95191 Other disorders following mastoidectomy, right ear: Secondary | ICD-10-CM | POA: Diagnosis not present

## 2017-06-16 DIAGNOSIS — H7202 Central perforation of tympanic membrane, left ear: Secondary | ICD-10-CM | POA: Diagnosis not present

## 2017-06-16 DIAGNOSIS — H6983 Other specified disorders of Eustachian tube, bilateral: Secondary | ICD-10-CM | POA: Diagnosis not present

## 2017-06-16 DIAGNOSIS — H900 Conductive hearing loss, bilateral: Secondary | ICD-10-CM | POA: Diagnosis not present

## 2017-06-16 MED FILL — MECLIZINE 25 MG TABLET: 25 | 15 days supply | Qty: 30 | Fill #0

## 2017-06-27 MED FILL — OMEPRAZOLE DR 40 MG CAPSULE: 40 | 90 days supply | Qty: 90 | Fill #0

## 2017-06-27 MED FILL — POLYETHYLENE GLYCOL 3350 PO: 30 days supply | Qty: 527 | Fill #0

## 2017-06-30 DIAGNOSIS — M542 Cervicalgia: Secondary | ICD-10-CM | POA: Diagnosis not present

## 2017-06-30 DIAGNOSIS — M5442 Lumbago with sciatica, left side: Secondary | ICD-10-CM | POA: Diagnosis not present

## 2017-06-30 DIAGNOSIS — M549 Dorsalgia, unspecified: Secondary | ICD-10-CM | POA: Diagnosis not present

## 2017-06-30 DIAGNOSIS — M545 Low back pain: Secondary | ICD-10-CM | POA: Diagnosis not present

## 2017-06-30 DIAGNOSIS — M5136 Other intervertebral disc degeneration, lumbar region: Secondary | ICD-10-CM | POA: Diagnosis not present

## 2017-06-30 MED FILL — predniSONE 5 MG (21) TBPK: 5 | 6 days supply | Qty: 21 | Fill #0

## 2017-07-07 ENCOUNTER — Other Ambulatory Visit (HOSPITAL_COMMUNITY): Payer: Self-pay | Admitting: Orthopaedic Surgery

## 2017-07-07 DIAGNOSIS — M5442 Lumbago with sciatica, left side: Secondary | ICD-10-CM

## 2017-07-15 ENCOUNTER — Ambulatory Visit (HOSPITAL_COMMUNITY)
Admission: RE | Admit: 2017-07-15 | Discharge: 2017-07-15 | Disposition: A | Discharge status: Home / Self Care | Payer: 59 | Source: Ambulatory Visit | Attending: Orthopaedic Surgery | Admitting: Orthopaedic Surgery

## 2017-07-15 DIAGNOSIS — M48061 Spinal stenosis, lumbar region without neurogenic claudication: Secondary | ICD-10-CM | POA: Diagnosis not present

## 2017-07-15 DIAGNOSIS — M5127 Other intervertebral disc displacement, lumbosacral region: Secondary | ICD-10-CM | POA: Insufficient documentation

## 2017-07-15 DIAGNOSIS — M5442 Lumbago with sciatica, left side: Secondary | ICD-10-CM | POA: Diagnosis not present

## 2017-07-15 DIAGNOSIS — M5126 Other intervertebral disc displacement, lumbar region: Secondary | ICD-10-CM | POA: Diagnosis not present

## 2017-07-15 DIAGNOSIS — M5136 Other intervertebral disc degeneration, lumbar region: Secondary | ICD-10-CM | POA: Diagnosis not present

## 2017-07-15 DIAGNOSIS — M545 Low back pain: Secondary | ICD-10-CM | POA: Diagnosis not present

## 2017-07-21 DIAGNOSIS — H6983 Other specified disorders of Eustachian tube, bilateral: Secondary | ICD-10-CM | POA: Diagnosis not present

## 2017-07-21 DIAGNOSIS — H95191 Other disorders following mastoidectomy, right ear: Secondary | ICD-10-CM | POA: Diagnosis not present

## 2017-07-21 DIAGNOSIS — D693 Immune thrombocytopenic purpura: Secondary | ICD-10-CM | POA: Diagnosis not present

## 2017-07-21 DIAGNOSIS — H900 Conductive hearing loss, bilateral: Secondary | ICD-10-CM | POA: Diagnosis not present

## 2017-07-21 DIAGNOSIS — H7202 Central perforation of tympanic membrane, left ear: Secondary | ICD-10-CM | POA: Diagnosis not present

## 2017-07-21 DIAGNOSIS — M542 Cervicalgia: Secondary | ICD-10-CM | POA: Diagnosis not present

## 2017-07-21 DIAGNOSIS — M47816 Spondylosis without myelopathy or radiculopathy, lumbar region: Secondary | ICD-10-CM | POA: Diagnosis not present

## 2017-07-21 DIAGNOSIS — M791 Myalgia: Secondary | ICD-10-CM | POA: Diagnosis not present

## 2017-07-21 DIAGNOSIS — H6041 Cholesteatoma of right external ear: Secondary | ICD-10-CM | POA: Diagnosis not present

## 2017-08-12 MED FILL — POLYETHYLENE GLYCOL 3350 PO: 89 days supply | Qty: 1581 | Fill #1

## 2017-08-17 DIAGNOSIS — M5136 Other intervertebral disc degeneration, lumbar region: Secondary | ICD-10-CM | POA: Diagnosis not present

## 2017-08-17 DIAGNOSIS — M542 Cervicalgia: Secondary | ICD-10-CM | POA: Diagnosis not present

## 2017-08-17 DIAGNOSIS — M791 Myalgia: Secondary | ICD-10-CM | POA: Diagnosis not present

## 2017-08-17 DIAGNOSIS — M47816 Spondylosis without myelopathy or radiculopathy, lumbar region: Secondary | ICD-10-CM | POA: Diagnosis not present

## 2017-08-25 MED FILL — PROGESTERONE 100 MG CAPSULE: 100 | 90 days supply | Qty: 90 | Fill #0

## 2017-08-25 MED FILL — ESTRADIOL 1 MG TAB: 1 | 90 days supply | Qty: 90 | Fill #0

## 2017-09-20 ENCOUNTER — Other Ambulatory Visit: Payer: Self-pay | Admitting: Obstetrics and Gynecology

## 2017-09-20 DIAGNOSIS — Z8262 Family history of osteoporosis: Secondary | ICD-10-CM | POA: Diagnosis not present

## 2017-09-20 DIAGNOSIS — N950 Postmenopausal bleeding: Secondary | ICD-10-CM | POA: Diagnosis not present

## 2017-09-20 MED FILL — ESTRADIOL 0.05 MG/DAY PATCH: 0.05 | 84 days supply | Qty: 12 | Fill #0

## 2017-10-19 MED FILL — OMEPRAZOLE DR 40 MG CAPSULE: 40 | 90 days supply | Qty: 90 | Fill #1

## 2017-10-24 MED FILL — PROGESTERONE 200 MG CAPSULE: 200 | 90 days supply | Qty: 90 | Fill #0

## 2017-11-17 MED FILL — POLYETHYLENE GLYCOL 3350 PO: 89 days supply | Qty: 1581 | Fill #0

## 2017-11-29 DIAGNOSIS — H9203 Otalgia, bilateral: Secondary | ICD-10-CM | POA: Diagnosis not present

## 2017-11-29 DIAGNOSIS — H95191 Other disorders following mastoidectomy, right ear: Secondary | ICD-10-CM | POA: Diagnosis not present

## 2017-11-29 DIAGNOSIS — H7202 Central perforation of tympanic membrane, left ear: Secondary | ICD-10-CM | POA: Diagnosis not present

## 2017-11-29 DIAGNOSIS — H6983 Other specified disorders of Eustachian tube, bilateral: Secondary | ICD-10-CM | POA: Diagnosis not present

## 2017-11-29 DIAGNOSIS — D693 Immune thrombocytopenic purpura: Secondary | ICD-10-CM | POA: Diagnosis not present

## 2017-11-29 DIAGNOSIS — H900 Conductive hearing loss, bilateral: Secondary | ICD-10-CM | POA: Diagnosis not present

## 2017-11-29 MED FILL — predniSONE 10 MG TABS: 10 | 5 days supply | Qty: 21 | Fill #0

## 2017-12-09 MED FILL — ESTRADIOL 0.05 MG/DAY PATCH: 0.05 | 84 days supply | Qty: 12 | Fill #1

## 2018-01-23 MED FILL — OMEPRAZOLE DR 40 MG CAPSULE: 40 | 90 days supply | Qty: 90 | Fill #0

## 2018-01-23 MED FILL — PROGESTERONE 200 MG CAPSULE: 200 | 90 days supply | Qty: 90 | Fill #1

## 2018-01-27 DIAGNOSIS — Z0000 Encounter for general adult medical examination without abnormal findings: Secondary | ICD-10-CM | POA: Diagnosis not present

## 2018-01-27 DIAGNOSIS — Z1331 Encounter for screening for depression: Secondary | ICD-10-CM | POA: Diagnosis not present

## 2018-01-27 DIAGNOSIS — Z1339 Encounter for screening examination for other mental health and behavioral disorders: Secondary | ICD-10-CM | POA: Diagnosis not present

## 2018-01-27 DIAGNOSIS — Z6824 Body mass index (BMI) 24.0-24.9, adult: Secondary | ICD-10-CM | POA: Diagnosis not present

## 2018-03-13 DIAGNOSIS — Z1211 Encounter for screening for malignant neoplasm of colon: Secondary | ICD-10-CM | POA: Diagnosis not present

## 2018-03-13 DIAGNOSIS — Z79890 Hormone replacement therapy (postmenopausal): Secondary | ICD-10-CM | POA: Diagnosis not present

## 2018-03-13 DIAGNOSIS — Z1231 Encounter for screening mammogram for malignant neoplasm of breast: Secondary | ICD-10-CM | POA: Diagnosis not present

## 2018-03-13 DIAGNOSIS — N644 Mastodynia: Secondary | ICD-10-CM | POA: Diagnosis not present

## 2018-03-13 DIAGNOSIS — Z01419 Encounter for gynecological examination (general) (routine) without abnormal findings: Secondary | ICD-10-CM | POA: Diagnosis not present

## 2018-03-13 DIAGNOSIS — N951 Menopausal and female climacteric states: Secondary | ICD-10-CM | POA: Insufficient documentation

## 2018-03-13 DIAGNOSIS — Z6825 Body mass index (BMI) 25.0-25.9, adult: Secondary | ICD-10-CM | POA: Diagnosis not present

## 2018-04-16 MED FILL — METAXALONE 800 MG TABLET: 800 | 30 days supply | Qty: 90 | Fill #1

## 2018-04-16 MED FILL — OMEPRAZOLE DR 40 MG CAPSULE: 40 | 90 days supply | Qty: 90 | Fill #1

## 2018-04-17 MED FILL — PROGESTERONE MICRONIZED 200: 200 | 90 days supply | Qty: 90 | Fill #2

## 2018-05-25 MED FILL — ESTRADIOL 1 MG TABS: 1 | 90 days supply | Qty: 90 | Fill #0

## 2018-07-10 DIAGNOSIS — Z6824 Body mass index (BMI) 24.0-24.9, adult: Secondary | ICD-10-CM | POA: Diagnosis not present

## 2018-07-10 DIAGNOSIS — R05 Cough: Secondary | ICD-10-CM | POA: Diagnosis not present

## 2018-07-10 MED FILL — CEFDINIR 300 MG CAPS: 300 | 10 days supply | Qty: 20 | Fill #0

## 2018-07-10 MED FILL — BENZONATATE 200 MG CAPS: 200 | 20 days supply | Qty: 60 | Fill #0

## 2018-07-10 MED FILL — predniSONE 20 MG TABS: 20 | 7 days supply | Qty: 7 | Fill #0

## 2018-07-18 MED FILL — OMEPRAZOLE 40 MG CPDR: 40 | 90 days supply | Qty: 90 | Fill #0

## 2018-08-08 MED FILL — PROGESTERONE MICRONIZED 200: 200 | 90 days supply | Qty: 90 | Fill #3

## 2018-08-24 MED FILL — CHLORHEXIDINE 0.12% RINSE: 0.12 | 16 days supply | Qty: 473 | Fill #0

## 2018-08-31 MED FILL — ESTRADIOL 1 MG TABLET: 1 | 68 days supply | Qty: 68 | Fill #1

## 2018-10-10 MED FILL — OMEPRAZOLE 40 MG CPDR: 40 | 90 days supply | Qty: 90 | Fill #1

## 2018-11-06 MED FILL — ESTRADIOL 1 MG TABS: 1 | 90 days supply | Qty: 90 | Fill #2

## 2018-11-17 MED FILL — PROGESTERONE 100 MG CAPSULE: 100 | 90 days supply | Qty: 90 | Fill #0

## 2019-01-05 MED FILL — PROGESTERONE 200 MG CAPSULE: 200 | 42 days supply | Qty: 42 | Fill #0

## 2019-01-17 MED FILL — OMEPRAZOLE 40 MG CPDR: 40 | 90 days supply | Qty: 90 | Fill #0

## 2019-02-13 MED FILL — ESTRADIOL 1 MG TABS: 1 | 90 days supply | Qty: 90 | Fill #3

## 2019-02-13 MED FILL — PROGESTERONE 200 MG CAPSULE: 200 | 48 days supply | Qty: 48 | Fill #1

## 2019-03-26 DIAGNOSIS — L570 Actinic keratosis: Secondary | ICD-10-CM | POA: Diagnosis not present

## 2019-04-02 DIAGNOSIS — C44329 Squamous cell carcinoma of skin of other parts of face: Secondary | ICD-10-CM | POA: Diagnosis not present

## 2019-04-02 DIAGNOSIS — L570 Actinic keratosis: Secondary | ICD-10-CM | POA: Diagnosis not present

## 2019-04-02 DIAGNOSIS — D485 Neoplasm of uncertain behavior of skin: Secondary | ICD-10-CM | POA: Diagnosis not present

## 2019-04-05 MED FILL — PROGESTERONE 200 MG CAPSULE: 200 | 90 days supply | Qty: 90 | Fill #0

## 2019-04-09 DIAGNOSIS — R1011 Right upper quadrant pain: Secondary | ICD-10-CM | POA: Diagnosis not present

## 2019-04-09 DIAGNOSIS — Z6824 Body mass index (BMI) 24.0-24.9, adult: Secondary | ICD-10-CM | POA: Diagnosis not present

## 2019-04-11 ENCOUNTER — Other Ambulatory Visit: Payer: Self-pay | Admitting: Gastroenterology

## 2019-04-11 ENCOUNTER — Other Ambulatory Visit (HOSPITAL_COMMUNITY): Payer: Self-pay | Admitting: Gastroenterology

## 2019-04-11 DIAGNOSIS — R110 Nausea: Secondary | ICD-10-CM | POA: Diagnosis not present

## 2019-04-11 DIAGNOSIS — K5900 Constipation, unspecified: Secondary | ICD-10-CM | POA: Diagnosis not present

## 2019-04-11 DIAGNOSIS — K8020 Calculus of gallbladder without cholecystitis without obstruction: Secondary | ICD-10-CM

## 2019-04-11 DIAGNOSIS — R1011 Right upper quadrant pain: Secondary | ICD-10-CM | POA: Diagnosis not present

## 2019-04-11 DIAGNOSIS — K5730 Diverticulosis of large intestine without perforation or abscess without bleeding: Secondary | ICD-10-CM | POA: Diagnosis not present

## 2019-04-11 DIAGNOSIS — K219 Gastro-esophageal reflux disease without esophagitis: Secondary | ICD-10-CM | POA: Diagnosis not present

## 2019-04-12 DIAGNOSIS — C44329 Squamous cell carcinoma of skin of other parts of face: Secondary | ICD-10-CM | POA: Diagnosis not present

## 2019-04-13 ENCOUNTER — Encounter (HOSPITAL_BASED_OUTPATIENT_CLINIC_OR_DEPARTMENT_OTHER): Payer: Self-pay

## 2019-04-13 ENCOUNTER — Ambulatory Visit (HOSPITAL_BASED_OUTPATIENT_CLINIC_OR_DEPARTMENT_OTHER): Payer: 59

## 2019-04-13 ENCOUNTER — Other Ambulatory Visit: Payer: Self-pay

## 2019-04-13 ENCOUNTER — Other Ambulatory Visit (HOSPITAL_BASED_OUTPATIENT_CLINIC_OR_DEPARTMENT_OTHER): Payer: Self-pay | Admitting: Gastroenterology

## 2019-04-13 ENCOUNTER — Ambulatory Visit (HOSPITAL_BASED_OUTPATIENT_CLINIC_OR_DEPARTMENT_OTHER)
Admission: RE | Admit: 2019-04-13 | Discharge: 2019-04-13 | Disposition: A | Discharge status: Home / Self Care | Payer: 59 | Source: Ambulatory Visit | Attending: Gastroenterology | Admitting: Gastroenterology

## 2019-04-13 DIAGNOSIS — R1011 Right upper quadrant pain: Secondary | ICD-10-CM | POA: Diagnosis not present

## 2019-04-13 DIAGNOSIS — R110 Nausea: Secondary | ICD-10-CM | POA: Diagnosis not present

## 2019-04-13 MED ORDER — IOHEXOL 300 MG/ML  SOLN
100.0000 mL | Freq: Once | INTRAMUSCULAR | Status: AC | PRN
  Administered 2019-04-13: 15:00:00 100 mL via INTRAVENOUS

## 2019-04-21 MED FILL — OMEPRAZOLE 40 MG CPDR: 40 | 90 days supply | Qty: 90 | Fill #1

## 2019-04-26 DIAGNOSIS — K219 Gastro-esophageal reflux disease without esophagitis: Secondary | ICD-10-CM | POA: Diagnosis not present

## 2019-04-26 DIAGNOSIS — K5904 Chronic idiopathic constipation: Secondary | ICD-10-CM | POA: Diagnosis not present

## 2019-04-26 DIAGNOSIS — K5730 Diverticulosis of large intestine without perforation or abscess without bleeding: Secondary | ICD-10-CM | POA: Diagnosis not present

## 2019-05-17 MED FILL — ESTRADIOL 1 MG TABS: 1 | 90 days supply | Qty: 90 | Fill #0

## 2019-05-21 DIAGNOSIS — K317 Polyp of stomach and duodenum: Secondary | ICD-10-CM | POA: Diagnosis not present

## 2019-05-21 DIAGNOSIS — R1011 Right upper quadrant pain: Secondary | ICD-10-CM | POA: Diagnosis not present

## 2019-05-21 DIAGNOSIS — K219 Gastro-esophageal reflux disease without esophagitis: Secondary | ICD-10-CM | POA: Diagnosis not present

## 2019-05-22 ENCOUNTER — Ambulatory Visit (HOSPITAL_COMMUNITY)
Admission: RE | Admit: 2019-05-22 | Discharge: 2019-05-22 | Disposition: A | Discharge status: Home / Self Care | Payer: 59 | Source: Ambulatory Visit | Attending: Gastroenterology | Admitting: Gastroenterology

## 2019-05-22 ENCOUNTER — Other Ambulatory Visit: Payer: Self-pay | Admitting: Gastroenterology

## 2019-05-22 ENCOUNTER — Other Ambulatory Visit (HOSPITAL_COMMUNITY): Payer: Self-pay | Admitting: Gastroenterology

## 2019-05-22 ENCOUNTER — Other Ambulatory Visit: Payer: Self-pay

## 2019-05-22 ENCOUNTER — Other Ambulatory Visit (HOSPITAL_COMMUNITY): Payer: Self-pay | Admitting: Family Medicine

## 2019-05-22 ENCOUNTER — Ambulatory Visit (HOSPITAL_COMMUNITY)
Admission: RE | Admit: 2019-05-22 | Discharge: 2019-05-22 | Disposition: A | Discharge status: Home / Self Care | Payer: 59 | Source: Ambulatory Visit | Attending: Family Medicine | Admitting: Family Medicine

## 2019-05-22 DIAGNOSIS — R109 Unspecified abdominal pain: Secondary | ICD-10-CM

## 2019-05-22 DIAGNOSIS — R1011 Right upper quadrant pain: Secondary | ICD-10-CM

## 2019-05-22 DIAGNOSIS — R110 Nausea: Secondary | ICD-10-CM | POA: Diagnosis not present

## 2019-05-22 DIAGNOSIS — K838 Other specified diseases of biliary tract: Secondary | ICD-10-CM | POA: Diagnosis not present

## 2019-05-22 DIAGNOSIS — R932 Abnormal findings on diagnostic imaging of liver and biliary tract: Secondary | ICD-10-CM | POA: Diagnosis not present

## 2019-05-22 MED ORDER — GADOBUTROL 1 MMOL/ML IV SOLN
6.0000 mL | Freq: Once | INTRAVENOUS | Status: AC | PRN
  Administered 2019-05-22: 6 mL via INTRAVENOUS

## 2019-05-22 MED FILL — LINZESS 290 MCG CAPSULE: 290 | 90 days supply | Qty: 90 | Fill #0

## 2019-05-23 MED FILL — METHYLPREDNISOLONE 4 MG TBP: 4 | 6 days supply | Qty: 21 | Fill #0

## 2019-05-23 MED FILL — DEXILANT DR 60 MG CAPSULE: 60 | 90 days supply | Qty: 90 | Fill #0

## 2019-05-28 DIAGNOSIS — R1011 Right upper quadrant pain: Secondary | ICD-10-CM | POA: Diagnosis not present

## 2019-05-28 DIAGNOSIS — Z6824 Body mass index (BMI) 24.0-24.9, adult: Secondary | ICD-10-CM | POA: Diagnosis not present

## 2019-06-04 ENCOUNTER — Other Ambulatory Visit: Payer: Self-pay | Admitting: Gastroenterology

## 2019-06-06 DIAGNOSIS — K219 Gastro-esophageal reflux disease without esophagitis: Secondary | ICD-10-CM | POA: Diagnosis not present

## 2019-06-08 ENCOUNTER — Other Ambulatory Visit (HOSPITAL_COMMUNITY)
Admission: RE | Admit: 2019-06-08 | Discharge: 2019-06-08 | Disposition: A | Discharge status: Home / Self Care | Payer: 59 | Source: Ambulatory Visit | Attending: Gastroenterology | Admitting: Gastroenterology

## 2019-06-08 DIAGNOSIS — Z1159 Encounter for screening for other viral diseases: Secondary | ICD-10-CM | POA: Insufficient documentation

## 2019-06-08 LAB — SARS CORONAVIRUS 2 (TAT 6-24 HRS): SARS Coronavirus 2: NEGATIVE

## 2019-06-11 ENCOUNTER — Encounter (HOSPITAL_COMMUNITY): Payer: Self-pay | Admitting: *Deleted

## 2019-06-11 ENCOUNTER — Other Ambulatory Visit: Payer: Self-pay

## 2019-06-11 NOTE — Progress Notes (Signed)
SPOKE W/  _patient via phone     SCREENING SYMPTOMS OF COVID 19:   COUGH--No  RUNNY NOSE---No   SORE THROAT---No  NASAL CONGESTION----No  SNEEZING----No  SHORTNESS OF BREATH---No  DIFFICULTY BREATHING---No  TEMP >100.0 -----No  UNEXPLAINED BODY ACHES------No  CHILLS -------- No  HEADACHES ---------No  LOSS OF SMELL/ TASTE --------No    HAVE YOU OR ANY FAMILY MEMBER TRAVELLED PAST 14 DAYS OUT OF THE   COUNTY--No STATE----No COUNTRY----No  HAVE YOU OR ANY FAMILY MEMBER BEEN EXPOSED TO ANYONE WITH COVID 19? No  Reviewed with patient visitor restrictions at present due to Covid 19 with patient verbalizing understanding.

## 2019-06-12 ENCOUNTER — Encounter (HOSPITAL_COMMUNITY): Payer: Self-pay | Admitting: *Deleted

## 2019-06-12 ENCOUNTER — Ambulatory Visit (HOSPITAL_COMMUNITY)
Admission: RE | Admit: 2019-06-12 | Discharge: 2019-06-12 | Disposition: A | Discharge status: Home / Self Care | Payer: 59 | Attending: Gastroenterology | Admitting: Gastroenterology

## 2019-06-12 ENCOUNTER — Encounter (HOSPITAL_COMMUNITY)
Admission: RE | Disposition: A | Discharge status: Home / Self Care | Payer: Self-pay | Source: Home / Self Care | Attending: Gastroenterology

## 2019-06-12 ENCOUNTER — Ambulatory Visit (HOSPITAL_COMMUNITY): Payer: 59 | Admitting: Certified Registered Nurse Anesthetist

## 2019-06-12 DIAGNOSIS — K838 Other specified diseases of biliary tract: Secondary | ICD-10-CM | POA: Diagnosis not present

## 2019-06-12 DIAGNOSIS — Z9049 Acquired absence of other specified parts of digestive tract: Secondary | ICD-10-CM | POA: Diagnosis not present

## 2019-06-12 DIAGNOSIS — Z825 Family history of asthma and other chronic lower respiratory diseases: Secondary | ICD-10-CM | POA: Insufficient documentation

## 2019-06-12 DIAGNOSIS — M1990 Unspecified osteoarthritis, unspecified site: Secondary | ICD-10-CM | POA: Insufficient documentation

## 2019-06-12 DIAGNOSIS — Z811 Family history of alcohol abuse and dependence: Secondary | ICD-10-CM | POA: Diagnosis not present

## 2019-06-12 DIAGNOSIS — Z841 Family history of disorders of kidney and ureter: Secondary | ICD-10-CM | POA: Diagnosis not present

## 2019-06-12 DIAGNOSIS — Z8379 Family history of other diseases of the digestive system: Secondary | ICD-10-CM | POA: Diagnosis not present

## 2019-06-12 DIAGNOSIS — D693 Immune thrombocytopenic purpura: Secondary | ICD-10-CM | POA: Diagnosis not present

## 2019-06-12 DIAGNOSIS — K219 Gastro-esophageal reflux disease without esophagitis: Secondary | ICD-10-CM | POA: Diagnosis not present

## 2019-06-12 DIAGNOSIS — E785 Hyperlipidemia, unspecified: Secondary | ICD-10-CM | POA: Diagnosis not present

## 2019-06-12 DIAGNOSIS — R0789 Other chest pain: Secondary | ICD-10-CM | POA: Insufficient documentation

## 2019-06-12 DIAGNOSIS — R1011 Right upper quadrant pain: Secondary | ICD-10-CM | POA: Diagnosis not present

## 2019-06-12 DIAGNOSIS — Z832 Family history of diseases of the blood and blood-forming organs and certain disorders involving the immune mechanism: Secondary | ICD-10-CM | POA: Diagnosis not present

## 2019-06-12 DIAGNOSIS — R51 Headache: Secondary | ICD-10-CM | POA: Insufficient documentation

## 2019-06-12 DIAGNOSIS — Z888 Allergy status to other drugs, medicaments and biological substances status: Secondary | ICD-10-CM | POA: Diagnosis not present

## 2019-06-12 DIAGNOSIS — Z833 Family history of diabetes mellitus: Secondary | ICD-10-CM | POA: Diagnosis not present

## 2019-06-12 DIAGNOSIS — Z885 Allergy status to narcotic agent status: Secondary | ICD-10-CM | POA: Diagnosis not present

## 2019-06-12 DIAGNOSIS — R262 Difficulty in walking, not elsewhere classified: Secondary | ICD-10-CM | POA: Diagnosis not present

## 2019-06-12 DIAGNOSIS — R932 Abnormal findings on diagnostic imaging of liver and biliary tract: Secondary | ICD-10-CM | POA: Diagnosis not present

## 2019-06-12 HISTORY — PX: ESOPHAGOGASTRODUODENOSCOPY (EGD) WITH PROPOFOL: SHX5813

## 2019-06-12 HISTORY — PX: UPPER ESOPHAGEAL ENDOSCOPIC ULTRASOUND (EUS): SHX6562

## 2019-06-12 SURGERY — UPPER ESOPHAGEAL ENDOSCOPIC ULTRASOUND (EUS)
Anesthesia: General

## 2019-06-12 MED ORDER — PROPOFOL 10 MG/ML IV BOLUS
INTRAVENOUS | Status: DC | PRN
  Administered 2019-06-12: 150 mg via INTRAVENOUS
  Administered 2019-06-12: 50 mg via INTRAVENOUS

## 2019-06-12 MED ORDER — LACTATED RINGERS IV SOLN
INTRAVENOUS | Status: AC | PRN
  Administered 2019-06-12: 1000 mL via INTRAVENOUS

## 2019-06-12 MED ORDER — GLUCAGON HCL RDNA (DIAGNOSTIC) 1 MG IJ SOLR
INTRAMUSCULAR | Status: AC
  Filled 2019-06-12: qty 1

## 2019-06-12 MED ORDER — FENTANYL CITRATE (PF) 100 MCG/2ML IJ SOLN
INTRAMUSCULAR | Status: DC | PRN
  Administered 2019-06-12 (×2): 50 ug via INTRAVENOUS

## 2019-06-12 MED ORDER — ONDANSETRON HCL 4 MG/2ML IJ SOLN
INTRAMUSCULAR | Status: DC | PRN
  Administered 2019-06-12: 4 mg via INTRAVENOUS

## 2019-06-12 MED ORDER — FENTANYL CITRATE (PF) 100 MCG/2ML IJ SOLN
INTRAMUSCULAR | Status: AC
  Filled 2019-06-12: qty 2

## 2019-06-12 MED ORDER — SUGAMMADEX SODIUM 200 MG/2ML IV SOLN
INTRAVENOUS | Status: DC | PRN
  Administered 2019-06-12: 200 mg via INTRAVENOUS

## 2019-06-12 MED ORDER — INDOMETHACIN 50 MG RE SUPP
RECTAL | Status: AC
  Filled 2019-06-12: qty 2

## 2019-06-12 MED ORDER — PROPOFOL 500 MG/50ML IV EMUL
INTRAVENOUS | Status: DC | PRN
  Administered 2019-06-12: 100 ug/kg/min via INTRAVENOUS

## 2019-06-12 MED ORDER — SCOPOLAMINE 1 MG/3DAYS TD PT72
MEDICATED_PATCH | TRANSDERMAL | Status: AC
  Filled 2019-06-12: qty 1

## 2019-06-12 MED ORDER — LIDOCAINE 2% (20 MG/ML) 5 ML SYRINGE
INTRAMUSCULAR | Status: DC | PRN
  Administered 2019-06-12: 60 mg via INTRAVENOUS

## 2019-06-12 MED ORDER — MIDAZOLAM HCL 2 MG/2ML IJ SOLN
INTRAMUSCULAR | Status: AC
  Filled 2019-06-12: qty 2

## 2019-06-12 MED ORDER — ROCURONIUM BROMIDE 10 MG/ML (PF) SYRINGE
PREFILLED_SYRINGE | INTRAVENOUS | Status: DC | PRN
  Administered 2019-06-12: 50 mg via INTRAVENOUS

## 2019-06-12 MED ORDER — SODIUM CHLORIDE 0.9 % IV SOLN: INTRAVENOUS | Status: DC

## 2019-06-12 MED ORDER — PROPOFOL 10 MG/ML IV BOLUS
INTRAVENOUS | Status: AC
  Filled 2019-06-12: qty 60

## 2019-06-12 MED ORDER — DEXAMETHASONE SODIUM PHOSPHATE 4 MG/ML IJ SOLN
INTRAMUSCULAR | Status: DC | PRN
  Administered 2019-06-12: 10 mg via INTRAVENOUS

## 2019-06-12 MED ORDER — CIPROFLOXACIN IN D5W 400 MG/200ML IV SOLN
INTRAVENOUS | Status: AC
  Filled 2019-06-12: qty 200

## 2019-06-12 NOTE — Anesthesia Postprocedure Evaluation (Signed)
Anesthesia Post Note  Patient: Adley R Keys  Procedure(s) Performed: UPPER ESOPHAGEAL ENDOSCOPIC ULTRASOUND (EUS) (N/A )     Patient location during evaluation: PACU Anesthesia Type: General Level of consciousness: awake and alert Pain management: pain level controlled Vital Signs Assessment: post-procedure vital signs reviewed and stable Respiratory status: spontaneous breathing, nonlabored ventilation, respiratory function stable and patient connected to nasal cannula oxygen Cardiovascular status: blood pressure returned to baseline and stable Postop Assessment: no apparent nausea or vomiting Anesthetic complications: no    Last Vitals:  Vitals:   06/12/19 1420 06/12/19 1435  BP: 125/66 138/68  Pulse: (!) 57 (!) 59  Resp: 14 (!) 9  Temp:    SpO2: 96% 97%    Last Pain:  Vitals:   06/12/19 1435  TempSrc:   PainSc: 0-No pain                 Lily Velasquez

## 2019-06-12 NOTE — Op Note (Signed)
[facility] Patient Name: Christine Brady Procedure Date: 06/12/2019 MRN: [id number] Attending MD: Carol Ada , MD Date of Birth: 1961-08-30 CSN: [id number] Age: 58 Admit Type: Inpatient Procedure:                Upper EUS Indications:              Common bile duct dilation (acquired) seen on MRCP Providers:                Carol Ada, MD, Cleda Daub, RN, Ladona Ridgel, Technician, Dellie Catholic Referring MD:              Medicines:                General Anesthesia Complications:            No immediate complications. Estimated Blood Loss:     Estimated blood loss: none. Procedure:                Pre-Anesthesia Assessment:                           - Prior to the procedure, a History and Physical                            was performed, and patient medications and                            allergies were reviewed. The patient's tolerance of                            previous anesthesia was also reviewed. The risks                            and benefits of the procedure and the sedation                            options and risks were discussed with the patient.                            All questions were answered, and informed consent                            was obtained. Prior Anticoagulants: The patient has                            taken no previous anticoagulant or antiplatelet                            agents. ASA Grade Assessment: II - A patient with                            mild systemic disease. After reviewing the risks  and benefits, the patient was deemed in                            satisfactory condition to undergo the procedure.                           - Sedation was administered by an anesthesia                            professional. General anesthesia was attained.                           After obtaining informed consent, the endoscope was                            passed  under direct vision. Throughout the                            procedure, the patient's blood pressure, pulse, and                            oxygen saturations were monitored continuously. The                            [id number] ([id number] Olympus Linear EUS was                            introduced through the mouth, and advanced to the                            third part of duodenum. The upper EUS was                            accomplished without difficulty. The patient                            tolerated the procedure well. Scope In: Scope Out: Findings:      ENDOSCOPIC FINDING: :      The examined esophagus was endoscopically normal.      The entire examined stomach was endoscopically normal.      The examined duodenum was endoscopically normal.      ENDOSONOGRAPHIC FINDING: :      There was dilation in the common bile duct which measured up to 8 mm.      Evidence of a previous cholecystectomy was identified       endosonographically.      There was no sign of significant endosonographic abnormality in the       entire pancreas. The pancreatic duct measured up to 1 mm in diameter.      The CBD was identified and it was mildly dilated at 8 mm proximally.       Distally it measureed 4 mm. There was NO EVIDENCE of any stones or       sludge. The PD was normal in morphology and caliber. The pancreatic       parenchyma was normal. There is no biliary evidence to  explain the       patient's current symptoms. Impression:               - Normal esophagus.                           - Normal stomach.                           - Normal examined duodenum.                           - There was dilation in the common bile duct which                            measured up to 8 mm.                           - Evidence of a cholecystectomy.                           - There was no sign of significant pathology in the                            entire pancreas.                           -  No specimens collected. Moderate Sedation:      Not Applicable - Patient had care per Anesthesia. Recommendation:           - Patient has a contact number available for                            emergencies. The signs and symptoms of potential                            delayed complications were discussed with the                            patient. Return to normal activities tomorrow.                            Written discharge instructions were provided to the                            patient.                           - Resume regular diet.                           - Further evaluation and treatment for chest wall                            pain. Procedure Code(s):        --- Professional ---  43888, Esophagogastroduodenoscopy, flexible,                            transoral; with endoscopic ultrasound examination                            limited to the esophagus, stomach or duodenum, and                            adjacent structures Diagnosis Code(s):        --- Professional ---                           K83.8, Other specified diseases of biliary tract                           Z90.49, Acquired absence of other specified parts                            of digestive tract CPT copyright 2019 [employer]. All rights reserved. The codes documented in this report are preliminary and upon coder review may  be revised to meet current compliance requirements. Carol Ada, MD Carol Ada, MD 06/12/2019 1:57:28 PM This report has been signed electronically. Number of Addenda: 0

## 2019-06-12 NOTE — Discharge Instructions (Signed)

## 2019-06-12 NOTE — Transfer of Care (Signed)
Immediate Anesthesia Transfer of Care Note  Patient: Sherilyn R Steinhauser  Procedure(s) Performed: UPPER ESOPHAGEAL ENDOSCOPIC ULTRASOUND (EUS) (N/A ) ENDOSCOPIC RETROGRADE CHOLANGIOPANCREATOGRAPHY (ERCP) WITH PROPOFOL (N/A )  Patient Location: PACU  Anesthesia Type:General  Level of Consciousness: drowsy  Airway & Oxygen Therapy: Patient Spontanous Breathing and Patient connected to face mask  Post-op Assessment: Report given to RN and Post -op Vital signs reviewed and stable  Post vital signs: Reviewed and stable  Last Vitals:  Vitals Value Taken Time  BP    Temp    Pulse    Resp    SpO2      Last Pain:  Vitals:   06/12/19 1221  TempSrc: Oral  PainSc: 5          Complications: No apparent anesthesia complications

## 2019-06-12 NOTE — Anesthesia Preprocedure Evaluation (Signed)
Anesthesia Evaluation  Patient identified by MRN, date of birth, ID band Patient awake    Reviewed: Allergy & Precautions, H&P , NPO status , Patient's Chart, lab work & pertinent test results  History of Anesthesia Complications (+) PONV  Airway Mallampati: II  TM Distance: >3 FB Neck ROM: Full    Dental  (+) Dental Advisory Given   Pulmonary neg pulmonary ROS,    breath sounds clear to auscultation       Cardiovascular negative cardio ROS   Rhythm:Regular Rate:Normal     Neuro/Psych  Headaches,    GI/Hepatic negative GI ROS, Neg liver ROS,   Endo/Other  negative endocrine ROS  Renal/GU negative Renal ROS     Musculoskeletal  (+) Arthritis ,   Abdominal   Peds  Hematology  (+) Blood dyscrasia (Hx of ITP), ,   Anesthesia Other Findings   Reproductive/Obstetrics                              Anesthesia Physical  Anesthesia Plan  ASA: III  Anesthesia Plan: General   Post-op Pain Management:    Induction: Intravenous  PONV Risk Score and Plan: 4 or greater and Ondansetron, Treatment may vary due to age or medical condition, Dexamethasone and TIVA  Airway Management Planned: Oral ETT  Additional Equipment:   Intra-op Plan:   Post-operative Plan: Extubation in OR  Informed Consent: I have reviewed the patients History and Physical, chart, labs and discussed the procedure including the risks, benefits and alternatives for the proposed anesthesia with the patient or authorized representative who has indicated his/her understanding and acceptance.     Dental advisory given  Plan Discussed with: Anesthesiologist, Surgeon and CRNA  Anesthesia Plan Comments:         Anesthesia Quick Evaluation

## 2019-06-12 NOTE — H&P (Signed)
  Christine Brady HPI: The patient has persistent RUQ pain that is not improved with the use of steroids.  The pain is worsened with movement and she feels that her GERD is "10 times" worse.  It is unresponsive to PPIs.  She is s/p lap chole for gallbladder sludge.  Past Medical History:  Diagnosis Date  . Abdominal pain   . Arthritis    difficulty walking  . Atypical chest pain 11/11/2016  . Cholesteatoma of right ear   . Chronic headaches   . 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HCC) 11/11/2016  . Cyst    back  . Hearing aid worn   . History of ITP   . Hyperlipidemia 11/11/2016  . PONV (postoperative nausea and vomiting)   . Reflux     Past Surgical History:  Procedure Laterality Date  . CHOLECYSTECTOMY  2010  . EXTERNAL EAR SURGERY  2009/2010  . EXTERNAL EAR SURGERY  2010   right ear  . KNEE ARTHROSCOPY     right  . KNEE LIGAMENT RECONSTRUCTION     left  . MASTOIDECTOMY Right 01/19/2017   Procedure: RIGHT MODIFIED RADICAL MASTOIDECTOMY;  Surgeon: Vicie Mutters, MD;  Location: [facility];  Service: ENT;  Laterality: Right;  . WISDOM TOOTH EXTRACTION      Family History  Problem Relation Age of Onset  . Diabetes Mother   . Asthma Mother   . Diabetes type II Mother   . Alcohol abuse Mother   . Hypertension Mother   . Diabetes Father   . Hypertension Father   . Kidney disease Father   . Diabetes type II Father   . Alcohol abuse Father   . Crohn's disease Sister   . Cerebral palsy Brother   . COPD Sister   . Thrombocytopenia Sister     Social History:  reports that she has never smoked. She has never used smokeless tobacco. She reports current alcohol use. She reports that she does not use drugs.  Allergies:  Allergies  Allergen Reactions  . Oxycodone     Skin crawling  . Claritin-D [Loratadine-Pseudoephedrine Er] Rash  . Codeine Rash    Medications:  Scheduled:  Continuous: . lactated ringers      No results found for this or any  previous visit (from the past 24 hour(s)).   No results found.  ROS:  As stated above in the HPI otherwise negative.  Blood pressure 137/78, temperature 98.6 F (37 C), temperature source Oral, resp. rate 16, height 5' 2.5" (1.588 m), weight 62.6 kg, SpO2 100 %.    PE: Gen: NAD, Alert and Oriented HEENT:  /AT, EOMI Neck: Supple, no LAD Lungs: CTA Bilaterally CV: RRR without M/G/R ABM: Soft, tender along the right rib margin, +BS Ext: No C/C/E  Assessment/Plan: 1) RUQ pain. 2) Mildly dilated CBD  Plan: 1) EUS +/- ERCP.  Keyaria Lawson D 06/12/2019, 1:15 PM

## 2019-06-12 NOTE — Anesthesia Procedure Notes (Signed)
Procedure Name: Intubation Date/Time: 06/12/2019 1:19 PM Performed by: Claudia Desanctis, CRNA Pre-anesthesia Checklist: Patient identified, Emergency Drugs available, Suction available and Patient being monitored Patient Re-evaluated:Patient Re-evaluated prior to induction Oxygen Delivery Method: Circle system utilized Preoxygenation: Pre-oxygenation with 100% oxygen Induction Type: IV induction Ventilation: Mask ventilation without difficulty Laryngoscope Size: 2 and Miller Grade View: Grade I Tube type: Oral Tube size: 7.0 mm Number of attempts: 1 Airway Equipment and Method: Stylet Placement Confirmation: ETT inserted through vocal cords under direct vision,  positive ETCO2 and breath sounds checked- equal and bilateral Secured at: 20 cm Tube secured with: Tape Dental Injury: Teeth and Oropharynx as per pre-operative assessment

## 2019-06-13 ENCOUNTER — Encounter (HOSPITAL_COMMUNITY): Payer: Self-pay | Admitting: Gastroenterology

## 2019-06-18 ENCOUNTER — Telehealth: Payer: Self-pay | Admitting: Gastroenterology

## 2019-06-18 NOTE — Telephone Encounter (Signed)
Patient called said would like a second opinion. I called [facility] and stated that Dr. Collene Mares had already contacted Dr. Silverio Decamp to review her records. Please advise for scheduling.

## 2019-06-19 DIAGNOSIS — K562 Volvulus: Secondary | ICD-10-CM | POA: Diagnosis not present

## 2019-06-19 DIAGNOSIS — K581 Irritable bowel syndrome with constipation: Secondary | ICD-10-CM | POA: Diagnosis not present

## 2019-06-19 MED FILL — METRONIDAZOLE 500 MG TABS: 500 | 1 days supply | Qty: 6 | Fill #0

## 2019-06-19 MED FILL — NEOMYCIN 500 MG TABLET: 500 | 1 days supply | Qty: 6 | Fill #0

## 2019-06-21 ENCOUNTER — Ambulatory Visit: Payer: Self-pay | Admitting: Surgery

## 2019-06-21 NOTE — H&P (View-Only) (Signed)
Christine Brady Documented: 06/19/2019 10:33 AM Location: [facility] Patient #: [id number] DOB: 07/16/1961 Single / Language: Christine Brady / Race: White Female   History of Present Illness [name] MD; 06/21/2019 6:36 AM) The patient is a 58 year old female who presents with abdominal pain. Note for "Abdominal pain": ` ` ` Patient sent for surgical consultation at the request of [name], MD  Chief Complaint: Right-sided epigastric abdominal pain ` ` The patient is a Woman that is been struggling with intermittent abdominal pain for the past 10 weeks. She feels a constant ache in her right upper side and epigastric region. Worse if she eats. She does have a history of heartburn or reflux and tends to avoid greasy or spicy foods already. She has a history of having biliary colic with some sludge and cholecystectomy by Dr. [name] with our group 10 years ago. She had some pain around that time and had CAT scans that were underwhelming. She notes that her heartburn reflux is gotten worse with these episodes. She is seeing gastroenterology. She was switched to [place]. She does not feel like it is helping that much. She did have MRI and endoscopic ultrasound to rule out any biliary dilatation or common bile duct stone which was negative. She could normally walk 1/2-hour without difficulty. She used to move her bowels twice a day but now she is very constipated needs to take a lot of MiraLAX to get her bowels moving. She is increasingly uncomfortable.  (Review of systems as stated in this history (HPI) or in the review of systems. Otherwise all other 12 point ROS are negative) ` ` `   Past Surgical History Sabino Gasser, [place]; 06/19/2019 10:33 AM) Gallbladder Surgery - Laparoscopic  Knee Surgery  Bilateral.  Diagnostic Studies History Sabino Gasser, CMA; 06/19/2019 10:33 AM) Colonoscopy  5-10 years ago Mammogram  1-3 years ago Pap Smear  1-5 years ago  Allergies Sabino Gasser, [place]; 06/19/2019 10:34 AM) Codeine/Codeine Derivatives  Claritin *ANTIHISTAMINES*  Allergies Reconciled   Medication History Sabino Gasser, CMA; 06/19/2019 10:36 AM) Calcium-Vitamin D (150MG  Tablet, Oral) Active. Estradiol (1MG  Tablet, Oral) Active. Linzess (290MCG Capsule, Oral) Active. Progesterone (200MG  Capsule, Oral) Active. oxyCODONE HCl (5MG  Capsule, Oral) Active. Medications Reconciled  Social History Sabino Gasser, CMA; 06/19/2019 10:33 AM) Alcohol use  Occasional alcohol use. Caffeine use  Coffee. No drug use  Tobacco use  Never smoker.  Family History Sabino Gasser, [place]; 06/19/2019 10:33 AM) Alcohol Abuse  Brother, Father, Mother. Arthritis  Mother. Bleeding disorder  Family Members In General, Sister. Diabetes Mellitus  Father, Mother. Ischemic Bowel Disease  Brother, Sister. Kidney Disease  Father. Migraine Headache  Family Members In General. Respiratory Condition  Mother, Sister.  Pregnancy / Birth History Sabino Gasser, CMA; 06/19/2019 10:33 AM) Age at menarche  46 years. Age of menopause  17-50 Gravida  0 Irregular periods  Para  0  Other Problems Sabino Gasser, [place]; 06/19/2019 10:33 AM) Arthritis  Back Pain  Cancer  Gastroesophageal Reflux Disease     Review of Systems Sabino Gasser CMA; 06/19/2019 10:33 AM) General Not Present- Appetite Loss, Chills, Fatigue, Fever, Night Sweats, Weight Gain and Weight Loss. Skin Not Present- Change in Wart/Mole, Dryness, Hives, Jaundice, New Lesions, Non-Healing Wounds, Rash and Ulcer. HEENT Present- Hearing Loss, Ringing in the Ears and Wears glasses/contact lenses. Not Present- Earache, Hoarseness, Nose Bleed, Oral Ulcers, Seasonal Allergies, Sinus Pain, Sore Throat, Visual Disturbances and Yellow Eyes. Respiratory Not Present- Bloody sputum, Chronic Cough, Difficulty Breathing,  Snoring and Wheezing. Breast Not Present- Breast Mass, Breast Pain, Nipple Discharge and  Skin Changes. Cardiovascular Not Present- Chest Pain, Difficulty Breathing Lying Down, Leg Cramps, Palpitations, Rapid Heart Rate, Shortness of Breath and Swelling of Extremities. Gastrointestinal Present- Abdominal Pain, Change in Bowel Habits, Constipation, Gets full quickly at meals, Indigestion and Nausea. Not Present- Bloating, Bloody Stool, Chronic diarrhea, Difficulty Swallowing, Excessive gas, Hemorrhoids, Rectal Pain and Vomiting. Female Genitourinary Not Present- Frequency, Nocturia, Painful Urination, Pelvic Pain and Urgency. Musculoskeletal Present- Back Pain. Not Present- Joint Pain, Joint Stiffness, Muscle Pain, Muscle Weakness and Swelling of Extremities. Neurological Not Present- Decreased Memory, Fainting, Headaches, Numbness, Seizures, Tingling, Tremor, Trouble walking and Weakness. Psychiatric Not Present- Anxiety, Bipolar, Change in Sleep Pattern, Depression, Fearful and Frequent crying. Endocrine Not Present- Cold Intolerance, Excessive Hunger, Hair Changes, Heat Intolerance, Hot flashes and New Diabetes. Hematology Present- Easy Bruising. Not Present- Blood Thinners, Excessive bleeding, Gland problems, HIV and Persistent Infections.  Vitals Sabino Gasser CMA; 06/19/2019 10:37 AM) 06/19/2019 10:36 AM Weight: 140 lb Height: 62in Body Surface Area: 1.64 m Body Mass Index: 25.61 kg/m  Temp.: 98.40F (Oral)  Pulse: 68 (Regular)  BP: 126/72(Sitting, Left Arm, Standard)       Physical Exam [name] MD; 06/19/2019 11:32 AM) General Mental Status-Alert. General Appearance-Not in acute distress, Not Sickly. Orientation-Oriented X3. Hydration-Well hydrated. Voice-Normal.  Integumentary Global Assessment Upon inspection and palpation of skin surfaces of the - Axillae: non-tender, no inflammation or ulceration, no drainage. and Distribution of scalp and body hair is normal. General Characteristics Temperature - normal warmth is noted.  Head and  Neck Head-normocephalic, atraumatic with no lesions or palpable masses. Face Global Assessment - atraumatic, no absence of expression. Neck Global Assessment - no abnormal movements, no bruit auscultated on the right, no bruit auscultated on the left, no decreased range of motion, non-tender. Trachea-midline. Thyroid Gland Characteristics - non-tender.  Eye Eyeball - Left-Extraocular movements intact, No Nystagmus - Left. Eyeball - Right-Extraocular movements intact, No Nystagmus - Right. Cornea - Left-No Hazy - Left. Cornea - Right-No Hazy - Right. Sclera/Conjunctiva - Left-No scleral icterus, No Discharge - Left. Sclera/Conjunctiva - Right-No scleral icterus, No Discharge - Right. Pupil - Left-Direct reaction to light normal. Pupil - Right-Direct reaction to light normal.  ENMT Ears Pinna - Left - no drainage observed, no generalized tenderness observed. Pinna - Right - no drainage observed, no generalized tenderness observed. Nose and Sinuses External Inspection of the Nose - no destructive lesion observed. Inspection of the nares - Left - quiet respiration. Inspection of the nares - Right - quiet respiration. Mouth and Throat Lips - Upper Lip - no fissures observed, no pallor noted. Lower Lip - no fissures observed, no pallor noted. Nasopharynx - no discharge present. Oral Cavity/Oropharynx - Tongue - no dryness observed. Oral Mucosa - no cyanosis observed. Hypopharynx - no evidence of airway distress observed.  Chest and Lung Exam Inspection Movements - Normal and Symmetrical. Accessory muscles - No use of accessory muscles in breathing. Palpation Palpation of the chest reveals - Non-tender. Auscultation Breath sounds - Normal and Clear.  Cardiovascular Auscultation Rhythm - Regular. Murmurs & Other Heart Sounds - Auscultation of the heart reveals - No Murmurs and No Systolic Clicks.  Abdomen Inspection Inspection of the abdomen reveals - No Visible  peristalsis and No Abnormal pulsations. Umbilicus - No Bleeding, No Urine drainage. Palpation/Percussion Palpation and Percussion of the abdomen reveal - Soft, Non Tender, No Rebound tenderness, No Rigidity (guarding) and No Cutaneous hyperesthesia.  Note: Abdomen soft. Mildly distended. Discomfort and right midabdomen more than epigastric region. No Murphy sign. Left abdomen and left epigastric region nontender. Lower abdomen suprapubic region nontender. No diastases. No umbilical hernia. No guarding or peritonitis.   Female Genitourinary Sexual Maturity Tanner 5 - Adult hair pattern. Note: No vaginal bleeding nor discharge   Peripheral Vascular Upper Extremity Inspection - Left - No Cyanotic nailbeds - Left, Not Ischemic. Inspection - Right - No Cyanotic nailbeds - Right, Not Ischemic.  Neurologic Neurologic evaluation reveals -normal attention span and ability to concentrate, able to name objects and repeat phrases. Appropriate fund of knowledge , normal sensation and normal coordination. Mental Status Affect - not angry, not paranoid. Cranial Nerves-Normal Bilaterally. Gait-Normal.  Neuropsychiatric Mental status exam performed with findings of-able to articulate well with normal speech/language, rate, volume and coherence, thought content normal with ability to perform basic computations and apply abstract reasoning and no evidence of hallucinations, delusions, obsessions or homicidal/suicidal ideation.  Musculoskeletal Global Assessment Spine, Ribs and Pelvis - no instability, subluxation or laxity. Right Upper Extremity - no instability, subluxation or laxity.  Lymphatic Head & Neck  General Head & Neck Lymphatics: Bilateral - Description - No Localized lymphadenopathy. Axillary  General Axillary Region: Bilateral - Description - No Localized lymphadenopathy. Femoral & Inguinal  Generalized Femoral & Inguinal Lymphatics: Left - Description - No Localized  lymphadenopathy. Right - Description - No Localized lymphadenopathy.    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  I extensively reviewed films with surgical colleague, Dr. Ninfa Linden as well as radiology. In my mind her cecum has flipped up into the right abdomen and is twisted. She has a floppy bascule that is intermittently volvulizes/kinks. This seems to correlate with her increasing constipation/obstipation and pain with eating.  My instincts she would benefit from a right colectomy to get rid of all the redundant tissue with an ileotransverse anastomosis. Ideally would do this robotically/minimally invasive. I don't think she can wait since she is having significant pain and discomfort.  Ideally she would get a repeat colonoscopy since his been 5 years that I don't think he can wait a few months to get that set up.  I did caution her that this may not be the source of the problem. I am skeptical that adhesions are etiology for this. Certainly gastroenterology is worked hard to rule out any gastric or biliary etiologies.The advantage of doing the minimally invasive exploration is I checked the entire abdomen to make sure were not missing anything. I would prepare for a right colectomy. She is interested in proceeding as soon as possible.   PREOP COLON - ENCOUNTER FOR PREOPERATIVE EXAMINATION FOR GENERAL SURGICAL PROCEDURE (Z01.818)  Current Plans You are being scheduled for surgery- Our schedulers will call you.  You should hear from our office's scheduling department within 5 working days about the location, date, and time of surgery. We try to make accommodations for patient's preferences in scheduling surgery, but sometimes the OR schedule or the surgeon's schedule prevents [place] from making those accommodations.  If you have  not heard from our office [phone]) in 5 working days, call the office and ask for your surgeon's nurse.  If you have other questions about your diagnosis, plan, or surgery, call the office and ask for your surgeon's nurse.  Written instructions provided The anatomy & physiology of the digestive  tract was discussed. The pathophysiology of the colon was discussed. Natural history risks without surgery was discussed. I feel the risks of no intervention will lead to serious problems that outweigh the operative risks; therefore, I recommended a partial colectomy to remove the pathology. Minimally invasive (Robotic/Laparoscopic) & open techniques were discussed.  Risks such as bleeding, infection, abscess, leak, reoperation, possible ostomy, hernia, heart attack, death, and other risks were discussed. I noted a good likelihood this will help address the problem. Goals of post-operative recovery were discussed as well. Need for adequate nutrition, daily bowel regimen and healthy physical activity, to optimize recovery was noted as well. We will work to minimize complications. Educational materials were available as well. Questions were answered. The patient expresses understanding & wishes to proceed with surgery.  Pt Education - [facility] Colon Bowel Prep 2018 ERAS/Miralax/Antibiotics Started Neomycin Sulfate 500 MG Oral Tablet, 2 (two) Tablet SEE NOTE, #6, 06/19/2019, No Refill. Local Order: Pharmacist Notes: TAKE TWO TABLETS AT 2 PM, 3 PM, AND 10 PM THE DAY PRIOR TO SURGERY Started Flagyl 500 MG Oral Tablet, 2 (two) Tablet SEE NOTE, #6, 06/19/2019, No Refill. Local Order: Pharmacist Notes: Take at 2pm, 3pm, and 10pm the day prior to your colon operation Pt Education - Pamphlet Given - Laparoscopic Colorectal Surgery: discussed with patient and provided information. Pt Education - [facility] Colectomy post-op instructions: discussed with patient and provided information.  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   Signed by [name], MD (06/21/2019 6:38 AM)  [name], MD, FACS, MASCRS Gastrointestinal and Minimally Invasive Surgery    [address] [place],  [address] [phone] Main / Paging [phone] Fax

## 2019-06-21 NOTE — H&P (Signed)
Christine Brady Documented: 06/19/2019 10:33 AM Location: [facility] Patient #: [id number] DOB: 03/28/1961 Single / Language: [name] / Race: White Female   History of Present Illness [name] MD; 06/21/2019 6:36 AM) The patient is a 58 year old female who presents with abdominal pain. Note for "Abdominal pain": ` ` ` Patient sent for surgical consultation at the request of [name], MD  Chief Complaint: Right-sided epigastric abdominal pain ` ` The patient is a Woman that is been struggling with intermittent abdominal pain for the past 10 weeks. She feels a constant ache in her right upper side and epigastric region. Worse if she eats. She does have a history of heartburn or reflux and tends to avoid greasy or spicy foods already. She has a history of having biliary colic with some sludge and cholecystectomy by Dr. [name] with our group 10 years ago. She had some pain around that time and had CAT scans that were underwhelming. She notes that her heartburn reflux is gotten worse with these episodes. She is seeing gastroenterology. She was switched to [name]. She does not feel like it is helping that much. She did have MRI and endoscopic ultrasound to rule out any biliary dilatation or common bile duct stone which was negative. She could normally walk 1/2-hour without difficulty. She used to move her bowels twice a day but now she is very constipated needs to take a lot of MiraLAX to get her bowels moving. She is increasingly uncomfortable.  (Review of systems as stated in this history (HPI) or in the review of systems. Otherwise all other 12 point ROS are negative) ` ` `   Past Surgical History Sabino Gasser, [place]; 06/19/2019 10:33 AM) Gallbladder Surgery - Laparoscopic  Knee Surgery  Bilateral.  Diagnostic Studies History Sabino Gasser, CMA; 06/19/2019 10:33 AM) Colonoscopy  5-10 years ago Mammogram  1-3 years ago Pap Smear  1-5 years ago  Allergies Sabino Gasser, [place]; 06/19/2019 10:34 AM) Codeine/Codeine Derivatives  Claritin *ANTIHISTAMINES*  Allergies Reconciled   Medication History Sabino Gasser, CMA; 06/19/2019 10:36 AM) Calcium-Vitamin D (150MG  Tablet, Oral) Active. Estradiol (1MG  Tablet, Oral) Active. Linzess (290MCG Capsule, Oral) Active. Progesterone (200MG  Capsule, Oral) Active. oxyCODONE HCl (5MG  Capsule, Oral) Active. Medications Reconciled  Social History Sabino Gasser, CMA; 06/19/2019 10:33 AM) Alcohol use  Occasional alcohol use. Caffeine use  Coffee. No drug use  Tobacco use  Never smoker.  Family History Sabino Gasser, [place]; 06/19/2019 10:33 AM) Alcohol Abuse  Brother, Father, Mother. Arthritis  Mother. Bleeding disorder  Family Members In General, Sister. Diabetes Mellitus  Father, Mother. Ischemic Bowel Disease  Brother, Sister. Kidney Disease  Father. Migraine Headache  Family Members In General. Respiratory Condition  Mother, Sister.  Pregnancy / Birth History Sabino Gasser, CMA; 06/19/2019 10:33 AM) Age at menarche  15 years. Age of menopause  28-50 Gravida  0 Irregular periods  Para  0  Other Problems Sabino Gasser, [place]; 06/19/2019 10:33 AM) Arthritis  Back Pain  Cancer  Gastroesophageal Reflux Disease     Review of Systems Sabino Gasser CMA; 06/19/2019 10:33 AM) General Not Present- Appetite Loss, Chills, Fatigue, Fever, Night Sweats, Weight Gain and Weight Loss. Skin Not Present- Change in Wart/Mole, Dryness, Hives, Jaundice, New Lesions, Non-Healing Wounds, Rash and Ulcer. HEENT Present- Hearing Loss, Ringing in the Ears and Wears glasses/contact lenses. Not Present- Earache, Hoarseness, Nose Bleed, Oral Ulcers, Seasonal Allergies, Sinus Pain, Sore Throat, Visual Disturbances and Yellow Eyes. Respiratory Not Present- Bloody sputum, Chronic Cough, Difficulty Breathing,  Snoring and Wheezing. Breast Not Present- Breast Mass, Breast Pain, Nipple Discharge and  Skin Changes. Cardiovascular Not Present- Chest Pain, Difficulty Breathing Lying Down, Leg Cramps, Palpitations, Rapid Heart Rate, Shortness of Breath and Swelling of Extremities. Gastrointestinal Present- Abdominal Pain, Change in Bowel Habits, Constipation, Gets full quickly at meals, Indigestion and Nausea. Not Present- Bloating, Bloody Stool, Chronic diarrhea, Difficulty Swallowing, Excessive gas, Hemorrhoids, Rectal Pain and Vomiting. Female Genitourinary Not Present- Frequency, Nocturia, Painful Urination, Pelvic Pain and Urgency. Musculoskeletal Present- Back Pain. Not Present- Joint Pain, Joint Stiffness, Muscle Pain, Muscle Weakness and Swelling of Extremities. Neurological Not Present- Decreased Memory, Fainting, Headaches, Numbness, Seizures, Tingling, Tremor, Trouble walking and Weakness. Psychiatric Not Present- Anxiety, Bipolar, Change in Sleep Pattern, Depression, Fearful and Frequent crying. Endocrine Not Present- Cold Intolerance, Excessive Hunger, Hair Changes, Heat Intolerance, Hot flashes and New Diabetes. Hematology Present- Easy Bruising. Not Present- Blood Thinners, Excessive bleeding, Gland problems, HIV and Persistent Infections.  Vitals Sabino Gasser CMA; 06/19/2019 10:37 AM) 06/19/2019 10:36 AM Weight: 140 lb Height: 62in Body Surface Area: 1.64 m Body Mass Index: 25.61 kg/m  Temp.: 98.21F (Oral)  Pulse: 68 (Regular)  BP: 126/72(Sitting, Left Arm, Standard)       Physical Exam [name] MD; 06/19/2019 11:32 AM) General Mental Status-Alert. General Appearance-Not in acute distress, Not Sickly. Orientation-Oriented X3. Hydration-Well hydrated. Voice-Normal.  Integumentary Global Assessment Upon inspection and palpation of skin surfaces of the - Axillae: non-tender, no inflammation or ulceration, no drainage. and Distribution of scalp and body hair is normal. General Characteristics Temperature - normal warmth is noted.  Head and  Neck Head-normocephalic, atraumatic with no lesions or palpable masses. Face Global Assessment - atraumatic, no absence of expression. Neck Global Assessment - no abnormal movements, no bruit auscultated on the right, no bruit auscultated on the left, no decreased range of motion, non-tender. Trachea-midline. Thyroid Gland Characteristics - non-tender.  Eye Eyeball - Left-Extraocular movements intact, No Nystagmus - Left. Eyeball - Right-Extraocular movements intact, No Nystagmus - Right. Cornea - Left-No Hazy - Left. Cornea - Right-No Hazy - Right. Sclera/Conjunctiva - Left-No scleral icterus, No Discharge - Left. Sclera/Conjunctiva - Right-No scleral icterus, No Discharge - Right. Pupil - Left-Direct reaction to light normal. Pupil - Right-Direct reaction to light normal.  ENMT Ears Pinna - Left - no drainage observed, no generalized tenderness observed. Pinna - Right - no drainage observed, no generalized tenderness observed. Nose and Sinuses External Inspection of the Nose - no destructive lesion observed. Inspection of the nares - Left - quiet respiration. Inspection of the nares - Right - quiet respiration. Mouth and Throat Lips - Upper Lip - no fissures observed, no pallor noted. Lower Lip - no fissures observed, no pallor noted. Nasopharynx - no discharge present. Oral Cavity/Oropharynx - Tongue - no dryness observed. Oral Mucosa - no cyanosis observed. Hypopharynx - no evidence of airway distress observed.  Chest and Lung Exam Inspection Movements - Normal and Symmetrical. Accessory muscles - No use of accessory muscles in breathing. Palpation Palpation of the chest reveals - Non-tender. Auscultation Breath sounds - Normal and Clear.  Cardiovascular Auscultation Rhythm - Regular. Murmurs & Other Heart Sounds - Auscultation of the heart reveals - No Murmurs and No Systolic Clicks.  Abdomen Inspection Inspection of the abdomen reveals - No Visible  peristalsis and No Abnormal pulsations. Umbilicus - No Bleeding, No Urine drainage. Palpation/Percussion Palpation and Percussion of the abdomen reveal - Soft, Non Tender, No Rebound tenderness, No Rigidity (guarding) and No Cutaneous hyperesthesia.  Note: Abdomen soft. Mildly distended. Discomfort and right midabdomen more than epigastric region. No Murphy sign. Left abdomen and left epigastric region nontender. Lower abdomen suprapubic region nontender. No diastases. No umbilical hernia. No guarding or peritonitis.   Female Genitourinary Sexual Maturity Tanner 5 - Adult hair pattern. Note: No vaginal bleeding nor discharge   Peripheral Vascular Upper Extremity Inspection - Left - No Cyanotic nailbeds - Left, Not Ischemic. Inspection - Right - No Cyanotic nailbeds - Right, Not Ischemic.  Neurologic Neurologic evaluation reveals -normal attention span and ability to concentrate, able to name objects and repeat phrases. Appropriate fund of knowledge , normal sensation and normal coordination. Mental Status Affect - not angry, not paranoid. Cranial Nerves-Normal Bilaterally. Gait-Normal.  Neuropsychiatric Mental status exam performed with findings of-able to articulate well with normal speech/language, rate, volume and coherence, thought content normal with ability to perform basic computations and apply abstract reasoning and no evidence of hallucinations, delusions, obsessions or homicidal/suicidal ideation.  Musculoskeletal Global Assessment Spine, Ribs and Pelvis - no instability, subluxation or laxity. Right Upper Extremity - no instability, subluxation or laxity.  Lymphatic Head & Neck  General Head & Neck Lymphatics: Bilateral - Description - No Localized lymphadenopathy. Axillary  General Axillary Region: Bilateral - Description - No Localized lymphadenopathy. Femoral & Inguinal  Generalized Femoral & Inguinal Lymphatics: Left - Description - No Localized  lymphadenopathy. Right - Description - No Localized lymphadenopathy.    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  I extensively reviewed films with surgical colleague, Dr. Ninfa Linden as well as radiology. In my mind her cecum has flipped up into the right abdomen and is twisted. She has a floppy bascule that is intermittently volvulizes/kinks. This seems to correlate with her increasing constipation/obstipation and pain with eating.  My instincts she would benefit from a right colectomy to get rid of all the redundant tissue with an ileotransverse anastomosis. Ideally would do this robotically/minimally invasive. I don't think she can wait since she is having significant pain and discomfort.  Ideally she would get a repeat colonoscopy since his been 5 years that I don't think he can wait a few months to get that set up.  I did caution her that this may not be the source of the problem. I am skeptical that adhesions are etiology for this. Certainly gastroenterology is worked hard to rule out any gastric or biliary etiologies.The advantage of doing the minimally invasive exploration is I checked the entire abdomen to make sure were not missing anything. I would prepare for a right colectomy. She is interested in proceeding as soon as possible.   PREOP COLON - ENCOUNTER FOR PREOPERATIVE EXAMINATION FOR GENERAL SURGICAL PROCEDURE (Z01.818)  Current Plans You are being scheduled for surgery- Our schedulers will call you.  You should hear from our office's scheduling department within 5 working days about the location, date, and time of surgery. We try to make accommodations for patient's preferences in scheduling surgery, but sometimes the OR schedule or the surgeon's schedule prevents [place] from making those accommodations.  If you have  not heard from our office [phone]) in 5 working days, call the office and ask for your surgeon's nurse.  If you have other questions about your diagnosis, plan, or surgery, call the office and ask for your surgeon's nurse.  Written instructions provided The anatomy & physiology of the digestive  tract was discussed. The pathophysiology of the colon was discussed. Natural history risks without surgery was discussed. I feel the risks of no intervention will lead to serious problems that outweigh the operative risks; therefore, I recommended a partial colectomy to remove the pathology. Minimally invasive (Robotic/Laparoscopic) & open techniques were discussed.  Risks such as bleeding, infection, abscess, leak, reoperation, possible ostomy, hernia, heart attack, death, and other risks were discussed. I noted a good likelihood this will help address the problem. Goals of post-operative recovery were discussed as well. Need for adequate nutrition, daily bowel regimen and healthy physical activity, to optimize recovery was noted as well. We will work to minimize complications. Educational materials were available as well. Questions were answered. The patient expresses understanding & wishes to proceed with surgery.  Pt Education - [facility] Colon Bowel Prep 2018 ERAS/Miralax/Antibiotics Started Neomycin Sulfate 500 MG Oral Tablet, 2 (two) Tablet SEE NOTE, #6, 06/19/2019, No Refill. Local Order: Pharmacist Notes: TAKE TWO TABLETS AT 2 PM, 3 PM, AND 10 PM THE DAY PRIOR TO SURGERY Started Flagyl 500 MG Oral Tablet, 2 (two) Tablet SEE NOTE, #6, 06/19/2019, No Refill. Local Order: Pharmacist Notes: Take at 2pm, 3pm, and 10pm the day prior to your colon operation Pt Education - Pamphlet Given - Laparoscopic Colorectal Surgery: discussed with patient and provided information. Pt Education - [facility] Colectomy post-op instructions: discussed with patient and provided information.  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   Signed by [name], MD (06/21/2019 6:38 AM)  [name], MD, FACS, MASCRS Gastrointestinal and Minimally Invasive Surgery    [address] [place], [place] [address] [phone] Main / Paging [phone] Fax

## 2019-06-22 MED FILL — PEG-3350 AND ELECTROLYTES S: 236 | 1 days supply | Qty: 4000 | Fill #0

## 2019-06-25 MED FILL — PROGESTERONE 200 MG CAPSULE: 200 | 90 days supply | Qty: 90 | Fill #0

## 2019-06-27 NOTE — Patient Instructions (Addendum)
YOU NEED TO HAVE A COVID 19 TEST ON 06-29-2019 AT 1045 AM. THIS TEST MUST BE DONE BEFORE SURGERY, COME TO [place] ENTRANCE. ONCE YOUR COVID TEST IS COMPLETED, PLEASE BEGIN THE QUARANTINE INSTRUCTIONS AS OUTLINED IN YOUR HANDOUT.                PLACIDA CAMBRE    Your procedure is scheduled on: 07-03-2019   Report to [facility] Main  Entrance    Report to Admitting at 10:30  AM    Call this number if you have problems the morning of surgery [phone]    Remember:   [place] 2 PRESURGERY ENSURE [place] AT 1000 PM AND 1 PRESURGERY DRINK THE DAY OF THE PROCEDURE 3 HOURS PRIOR TO SCHEDULED SURGERY. NO SOLIDS AFTER MIDNIGHT THE DAY PRIOR TO THE SURGERY. NOTHING BY MOUTH EXCEPT CLEAR LIQUIDS UNTIL THREE HOURS PRIOR TO SCHEDULED SURGERY. PLEASE FINISH PRESURGERY ENSURE DRINK PER SURGEON ORDER 3 HOURS PRIOR TO SCHEDULED SURGERY TIME WHICH NEEDS TO BE COMPLETED AT 9:30 AM.    CLEAR LIQUID DIET   Foods Allowed                                                                     Foods Excluded  Coffee and tea, regular and decaf                             liquids that you cannot  Plain Jell-O any favor except red or purple                                           see through such as: Fruit ices (not with fruit pulp)                                     milk, soups, orange juice  Iced Popsicles                                    All solid food Carbonated beverages, regular and diet                                    Cranberry, grape and apple juices Sports drinks like Gatorade Lightly seasoned clear broth or consume(fat free) Sugar, honey syrup  Sample Menu Breakfast                                Lunch                                     Supper Cranberry juice  Beef broth                            Chicken broth Jell-O                                      Grape juice                           Apple juice Coffee or tea                        Jell-O                                      Popsicle                                                Coffee or tea                        Coffee or tea  _____________________________________________________________________     Take these medicines the morning of surgery with A SIP OF WATER: None, per pt's request  BRUSH YOUR TEETH MORNING OF SURGERY AND RINSE YOUR MOUTH OUT, NO CHEWING GUM CANDY OR MINTS.                               You may not have any metal on your body including hair pins and              piercings     Do not wear jewelry, make-up, lotions, powders or perfumes, deodorant              Do not wear nail polish.  Do not shave  48 hours prior to surgery.                Do not bring valuables to the hospital. [place].  Contacts, dentures or bridgework may not be worn into surgery.             _____________________________________________________________________             [facility] - Preparing for Surgery Before surgery, you can play an important role.  Because skin is not sterile, your skin needs to be as free of germs as possible.  You can reduce the number of germs on your skin by washing with CHG (chlorahexidine gluconate) soap before surgery.  CHG is an antiseptic cleaner which kills germs and bonds with the skin to continue killing germs even after washing. Please DO NOT use if you have an allergy to CHG or antibacterial soaps.  If your skin becomes reddened/irritated stop using the CHG and inform your nurse when you arrive at Short Stay. Do not shave (including legs and underarms) for at least 48 hours prior to the first CHG shower.  You may shave your face/neck. Please follow these instructions carefully:  1.  Shower with CHG Soap  the night before surgery and the  morning of Surgery.  2.  If you choose to wash your  hair, wash your hair first as usual with your  normal  shampoo.  3.  After you shampoo, rinse your hair and body thoroughly to remove the  shampoo.                           4.  Use CHG as you would any other liquid soap.  You can apply chg directly  to the skin and wash                       Gently with a scrungie or clean washcloth.  5.  Apply the CHG Soap to your body ONLY FROM THE NECK DOWN.   Do not use on face/ open                           Wound or open sores. Avoid contact with eyes, ears mouth and genitals (private parts).                       Wash face,  Genitals (private parts) with your normal soap.             6.  Wash thoroughly, paying special attention to the area where your surgery  will be performed.  7.  Thoroughly rinse your body with warm water from the neck down.  8.  DO NOT shower/wash with your normal soap after using and rinsing off  the CHG Soap.                9.  Pat yourself dry with a clean towel.            10.  Wear clean pajamas.            11.  Place clean sheets on your bed the night of your first shower and do not  sleep with pets. Day of Surgery : Do not apply any lotions/deodorants the morning of surgery.  Please wear clean clothes to the hospital/surgery center.  FAILURE TO FOLLOW THESE INSTRUCTIONS MAY RESULT IN THE CANCELLATION OF YOUR SURGERY PATIENT SIGNATURE_________________________________  NURSE SIGNATURE__________________________________  ________________________________________________________________________

## 2019-06-29 ENCOUNTER — Encounter (HOSPITAL_COMMUNITY)
Admission: RE | Admit: 2019-06-29 | Discharge: 2019-06-29 | Disposition: A | Discharge status: Home / Self Care | Payer: 59 | Source: Ambulatory Visit | Attending: Surgery | Admitting: Surgery

## 2019-06-29 ENCOUNTER — Other Ambulatory Visit: Payer: Self-pay

## 2019-06-29 ENCOUNTER — Encounter (HOSPITAL_COMMUNITY): Payer: Self-pay

## 2019-06-29 ENCOUNTER — Other Ambulatory Visit (HOSPITAL_COMMUNITY)
Admission: RE | Admit: 2019-06-29 | Discharge: 2019-06-29 | Disposition: A | Discharge status: Home / Self Care | Payer: 59 | Source: Ambulatory Visit | Attending: Surgery | Admitting: Surgery

## 2019-06-29 DIAGNOSIS — Z01812 Encounter for preprocedural laboratory examination: Secondary | ICD-10-CM | POA: Diagnosis not present

## 2019-06-29 DIAGNOSIS — R109 Unspecified abdominal pain: Secondary | ICD-10-CM | POA: Insufficient documentation

## 2019-06-29 DIAGNOSIS — Z1159 Encounter for screening for other viral diseases: Secondary | ICD-10-CM | POA: Insufficient documentation

## 2019-06-29 DIAGNOSIS — K562 Volvulus: Secondary | ICD-10-CM | POA: Insufficient documentation

## 2019-06-29 LAB — CBC
HCT: 40.5 % (ref 36.0–46.0)
Hemoglobin: 13.3 g/dL (ref 12.0–15.0)
MCH: 31.9 pg (ref 26.0–34.0)
MCHC: 32.8 g/dL (ref 30.0–36.0)
MCV: 97.1 fL (ref 80.0–100.0)
Platelets: 127 10*3/uL — ABNORMAL LOW (ref 150–400)
RBC: 4.17 MIL/uL (ref 3.87–5.11)
RDW: 12.6 % (ref 11.5–15.5)
WBC: 6 10*3/uL (ref 4.0–10.5)
nRBC: 0 % (ref 0.0–0.2)

## 2019-06-29 LAB — HEMOGLOBIN A1C
Hgb A1c MFr Bld: 5.2 % (ref 4.8–5.6)
Mean Plasma Glucose: 102.54 mg/dL

## 2019-06-29 LAB — ABO/RH: ABO/RH(D): A POS

## 2019-06-29 LAB — APTT: aPTT: 30 seconds (ref 24–36)

## 2019-06-29 LAB — PROTIME-INR
INR: 0.9 (ref 0.8–1.2)
Prothrombin Time: 12.1 seconds (ref 11.4–15.2)

## 2019-06-29 LAB — SARS CORONAVIRUS 2 (TAT 6-24 HRS): SARS Coronavirus 2: NEGATIVE

## 2019-06-29 NOTE — Progress Notes (Signed)
Pt has a history of chronic idiopathic thrombocytopenia. T&S, PT, and PTT placed per verbal orders from [place], NP.  Also, spoke to [place], Triage Nurse at Dr. Johney Maine' office to advise that platelet was 120 on lab result. Sunday Spillers will report to Dr. Johney Maine to see if prepared platelet will be necessary for pt's procedure, as pt reported that she has customarily had to have platelets with her major surgery in the past.

## 2019-07-02 DIAGNOSIS — K5904 Chronic idiopathic constipation: Secondary | ICD-10-CM | POA: Diagnosis not present

## 2019-07-02 DIAGNOSIS — K5730 Diverticulosis of large intestine without perforation or abscess without bleeding: Secondary | ICD-10-CM | POA: Diagnosis not present

## 2019-07-02 DIAGNOSIS — Z1211 Encounter for screening for malignant neoplasm of colon: Secondary | ICD-10-CM | POA: Diagnosis not present

## 2019-07-02 MED ORDER — SODIUM CHLORIDE 0.9 % IV SOLN
INTRAVENOUS | Status: DC
  Filled 2019-07-02: qty 6

## 2019-07-02 MED ORDER — BUPIVACAINE LIPOSOME 1.3 % IJ SUSP
20.0000 mL | Freq: Once | INTRAMUSCULAR | Status: DC
  Filled 2019-07-02: qty 20

## 2019-07-03 ENCOUNTER — Inpatient Hospital Stay (HOSPITAL_COMMUNITY)
Admission: RE | Admit: 2019-07-03 | Discharge: 2019-07-16 | DRG: 330 | Disposition: A | Discharge status: Home / Self Care | Payer: 59 | Attending: Surgery | Admitting: Surgery

## 2019-07-03 ENCOUNTER — Encounter (HOSPITAL_COMMUNITY)
Admission: RE | Disposition: A | Discharge status: Home / Self Care | Payer: Self-pay | Source: Home / Self Care | Attending: Surgery

## 2019-07-03 ENCOUNTER — Inpatient Hospital Stay (HOSPITAL_COMMUNITY): Payer: 59 | Admitting: Anesthesiology

## 2019-07-03 ENCOUNTER — Encounter (HOSPITAL_COMMUNITY): Payer: Self-pay | Admitting: Anesthesiology

## 2019-07-03 ENCOUNTER — Inpatient Hospital Stay (HOSPITAL_COMMUNITY): Payer: 59 | Admitting: Emergency Medicine

## 2019-07-03 DIAGNOSIS — G4700 Insomnia, unspecified: Secondary | ICD-10-CM | POA: Diagnosis not present

## 2019-07-03 DIAGNOSIS — K219 Gastro-esophageal reflux disease without esophagitis: Secondary | ICD-10-CM | POA: Diagnosis present

## 2019-07-03 DIAGNOSIS — K6389 Other specified diseases of intestine: Secondary | ICD-10-CM | POA: Diagnosis not present

## 2019-07-03 DIAGNOSIS — K56600 Partial intestinal obstruction, unspecified as to cause: Secondary | ICD-10-CM | POA: Diagnosis not present

## 2019-07-03 DIAGNOSIS — Z811 Family history of alcohol abuse and dependence: Secondary | ICD-10-CM | POA: Diagnosis not present

## 2019-07-03 DIAGNOSIS — Z833 Family history of diabetes mellitus: Secondary | ICD-10-CM

## 2019-07-03 DIAGNOSIS — E785 Hyperlipidemia, unspecified: Secondary | ICD-10-CM | POA: Diagnosis not present

## 2019-07-03 DIAGNOSIS — K567 Ileus, unspecified: Secondary | ICD-10-CM | POA: Diagnosis not present

## 2019-07-03 DIAGNOSIS — Z20828 Contact with and (suspected) exposure to other viral communicable diseases: Secondary | ICD-10-CM | POA: Diagnosis present

## 2019-07-03 DIAGNOSIS — M1990 Unspecified osteoarthritis, unspecified site: Secondary | ICD-10-CM | POA: Diagnosis present

## 2019-07-03 DIAGNOSIS — Z8249 Family history of ischemic heart disease and other diseases of the circulatory system: Secondary | ICD-10-CM

## 2019-07-03 DIAGNOSIS — K562 Volvulus: Secondary | ICD-10-CM | POA: Insufficient documentation

## 2019-07-03 DIAGNOSIS — Z832 Family history of diseases of the blood and blood-forming organs and certain disorders involving the immune mechanism: Secondary | ICD-10-CM | POA: Diagnosis not present

## 2019-07-03 DIAGNOSIS — Y92239 Unspecified place in hospital as the place of occurrence of the external cause: Secondary | ICD-10-CM | POA: Diagnosis not present

## 2019-07-03 DIAGNOSIS — D509 Iron deficiency anemia, unspecified: Secondary | ICD-10-CM | POA: Diagnosis present

## 2019-07-03 DIAGNOSIS — Z4682 Encounter for fitting and adjustment of non-vascular catheter: Secondary | ICD-10-CM | POA: Diagnosis not present

## 2019-07-03 DIAGNOSIS — R112 Nausea with vomiting, unspecified: Secondary | ICD-10-CM

## 2019-07-03 DIAGNOSIS — K589 Irritable bowel syndrome without diarrhea: Secondary | ICD-10-CM

## 2019-07-03 DIAGNOSIS — Z0189 Encounter for other specified special examinations: Secondary | ICD-10-CM

## 2019-07-03 DIAGNOSIS — D62 Acute posthemorrhagic anemia: Secondary | ICD-10-CM | POA: Diagnosis not present

## 2019-07-03 DIAGNOSIS — Z9049 Acquired absence of other specified parts of digestive tract: Secondary | ICD-10-CM | POA: Diagnosis not present

## 2019-07-03 DIAGNOSIS — Z825 Family history of asthma and other chronic lower respiratory diseases: Secondary | ICD-10-CM | POA: Diagnosis not present

## 2019-07-03 DIAGNOSIS — T40605A Adverse effect of unspecified narcotics, initial encounter: Secondary | ICD-10-CM | POA: Diagnosis not present

## 2019-07-03 DIAGNOSIS — Z841 Family history of disorders of kidney and ureter: Secondary | ICD-10-CM

## 2019-07-03 DIAGNOSIS — R0789 Other chest pain: Secondary | ICD-10-CM

## 2019-07-03 DIAGNOSIS — Q438 Other specified congenital malformations of intestine: Secondary | ICD-10-CM

## 2019-07-03 DIAGNOSIS — Z885 Allergy status to narcotic agent status: Secondary | ICD-10-CM | POA: Diagnosis not present

## 2019-07-03 DIAGNOSIS — D693 Immune thrombocytopenic purpura: Secondary | ICD-10-CM | POA: Diagnosis not present

## 2019-07-03 DIAGNOSIS — Z888 Allergy status to other drugs, medicaments and biological substances status: Secondary | ICD-10-CM

## 2019-07-03 DIAGNOSIS — K581 Irritable bowel syndrome with constipation: Secondary | ICD-10-CM | POA: Diagnosis present

## 2019-07-03 DIAGNOSIS — Z87898 Personal history of other specified conditions: Secondary | ICD-10-CM

## 2019-07-03 DIAGNOSIS — E876 Hypokalemia: Secondary | ICD-10-CM | POA: Diagnosis not present

## 2019-07-03 DIAGNOSIS — Z974 Presence of external hearing-aid: Secondary | ICD-10-CM

## 2019-07-03 DIAGNOSIS — Z8261 Family history of arthritis: Secondary | ICD-10-CM | POA: Diagnosis not present

## 2019-07-03 DIAGNOSIS — R519 Headache, unspecified: Secondary | ICD-10-CM

## 2019-07-03 DIAGNOSIS — R109 Unspecified abdominal pain: Secondary | ICD-10-CM

## 2019-07-03 DIAGNOSIS — K388 Other specified diseases of appendix: Secondary | ICD-10-CM | POA: Diagnosis not present

## 2019-07-03 LAB — TYPE AND SCREEN
ABO/RH(D): A POS
Antibody Screen: NEGATIVE

## 2019-07-03 SURGERY — COLECTOMY, PARTIAL, ROBOT-ASSISTED, LAPAROSCOPIC
Anesthesia: General | Site: Abdomen | Laterality: Right

## 2019-07-03 MED ORDER — BISACODYL 5 MG PO TBEC
20.0000 mg | DELAYED_RELEASE_TABLET | Freq: Once | ORAL | Status: DC
  Filled 2019-07-03: qty 4

## 2019-07-03 MED ORDER — FENTANYL CITRATE (PF) 100 MCG/2ML IJ SOLN
25.0000 ug | INTRAMUSCULAR | Status: DC | PRN
  Administered 2019-07-03 (×4): 50 ug via INTRAVENOUS

## 2019-07-03 MED ORDER — LIDOCAINE 2% (20 MG/ML) 5 ML SYRINGE
INTRAMUSCULAR | Status: DC | PRN
  Administered 2019-07-03: 1.5 mg/kg/h via INTRAVENOUS

## 2019-07-03 MED ORDER — FENTANYL CITRATE (PF) 250 MCG/5ML IJ SOLN
INTRAMUSCULAR | Status: DC | PRN
  Administered 2019-07-03: 75 ug via INTRAVENOUS
  Administered 2019-07-03 (×2): 50 ug via INTRAVENOUS
  Administered 2019-07-03: 25 ug via INTRAVENOUS
  Administered 2019-07-03: 50 ug via INTRAVENOUS

## 2019-07-03 MED ORDER — METRONIDAZOLE 500 MG PO TABS: 1000.0000 mg | ORAL_TABLET | ORAL | Status: DC

## 2019-07-03 MED ORDER — HYDROMORPHONE HCL 1 MG/ML IJ SOLN
INTRAMUSCULAR | Status: AC
  Filled 2019-07-03: qty 2

## 2019-07-03 MED ORDER — PROPOFOL 10 MG/ML IV BOLUS
INTRAVENOUS | Status: DC | PRN
  Administered 2019-07-03: 110 mg via INTRAVENOUS

## 2019-07-03 MED ORDER — ENOXAPARIN SODIUM 40 MG/0.4ML ~~LOC~~ SOLN
40.0000 mg | Freq: Once | SUBCUTANEOUS | Status: AC
  Administered 2019-07-03: 40 mg via SUBCUTANEOUS
  Filled 2019-07-03: qty 0.4

## 2019-07-03 MED ORDER — MEPERIDINE HCL 50 MG/ML IJ SOLN: 6.2500 mg | INTRAMUSCULAR | Status: DC | PRN

## 2019-07-03 MED ORDER — PROMETHAZINE HCL 25 MG/ML IJ SOLN: 6.2500 mg | INTRAMUSCULAR | Status: DC | PRN

## 2019-07-03 MED ORDER — ROCURONIUM BROMIDE 10 MG/ML (PF) SYRINGE
PREFILLED_SYRINGE | INTRAVENOUS | Status: DC | PRN
  Administered 2019-07-03: 10 mg via INTRAVENOUS
  Administered 2019-07-03: 50 mg via INTRAVENOUS

## 2019-07-03 MED ORDER — SUCCINYLCHOLINE CHLORIDE 200 MG/10ML IV SOSY
PREFILLED_SYRINGE | INTRAVENOUS | Status: AC
  Filled 2019-07-03: qty 10

## 2019-07-03 MED ORDER — CELECOXIB 200 MG PO CAPS
200.0000 mg | ORAL_CAPSULE | ORAL | Status: AC
  Administered 2019-07-03: 12:00:00 200 mg via ORAL
  Filled 2019-07-03: qty 1

## 2019-07-03 MED ORDER — PHENYLEPHRINE 40 MCG/ML (10ML) SYRINGE FOR IV PUSH (FOR BLOOD PRESSURE SUPPORT)
PREFILLED_SYRINGE | INTRAVENOUS | Status: AC
  Filled 2019-07-03: qty 10

## 2019-07-03 MED ORDER — SODIUM CHLORIDE 0.9 % IV SOLN
INTRAVENOUS | Status: DC | PRN
  Administered 2019-07-03: 1000 mL

## 2019-07-03 MED ORDER — ENSURE PRE-SURGERY PO LIQD
296.0000 mL | Freq: Once | ORAL | Status: DC
  Filled 2019-07-03: qty 296

## 2019-07-03 MED ORDER — GABAPENTIN 300 MG PO CAPS
300.0000 mg | ORAL_CAPSULE | ORAL | Status: AC
  Administered 2019-07-03: 12:00:00 300 mg via ORAL
  Filled 2019-07-03: qty 1

## 2019-07-03 MED ORDER — TRAMADOL HCL 50 MG PO TABS
50.0000 mg | ORAL_TABLET | Freq: Four times a day (QID) | ORAL | Status: DC | PRN
  Administered 2019-07-04: 50 mg via ORAL
  Administered 2019-07-05: 100 mg via ORAL
  Filled 2019-07-03: qty 2
  Filled 2019-07-03: qty 1

## 2019-07-03 MED ORDER — ALVIMOPAN 12 MG PO CAPS
12.0000 mg | ORAL_CAPSULE | Freq: Two times a day (BID) | ORAL | Status: DC
  Administered 2019-07-04 – 2019-07-05 (×3): 12 mg via ORAL
  Filled 2019-07-03 (×3): qty 1

## 2019-07-03 MED ORDER — ONDANSETRON HCL 4 MG PO TABS: 4.0000 mg | ORAL_TABLET | Freq: Four times a day (QID) | ORAL | Status: DC | PRN

## 2019-07-03 MED ORDER — HYDRALAZINE HCL 20 MG/ML IJ SOLN: 10.0000 mg | INTRAMUSCULAR | Status: DC | PRN

## 2019-07-03 MED ORDER — MIDAZOLAM HCL 2 MG/2ML IJ SOLN
INTRAMUSCULAR | Status: DC | PRN
  Administered 2019-07-03 (×2): 1 mg via INTRAVENOUS

## 2019-07-03 MED ORDER — ACETAMINOPHEN 10 MG/ML IV SOLN
INTRAVENOUS | Status: AC
  Filled 2019-07-03: qty 100

## 2019-07-03 MED ORDER — SODIUM CHLORIDE 0.9 % IV SOLN
Freq: Three times a day (TID) | INTRAVENOUS | Status: AC | PRN
  Administered 2019-07-05: 08:00:00 via INTRAVENOUS

## 2019-07-03 MED ORDER — DEXAMETHASONE SODIUM PHOSPHATE 10 MG/ML IJ SOLN
INTRAMUSCULAR | Status: DC | PRN
  Administered 2019-07-03: 10 mg via INTRAVENOUS

## 2019-07-03 MED ORDER — ACETAMINOPHEN 10 MG/ML IV SOLN
1000.0000 mg | Freq: Once | INTRAVENOUS | Status: AC
  Administered 2019-07-03: 17:00:00 1000 mg via INTRAVENOUS

## 2019-07-03 MED ORDER — POLYETHYLENE GLYCOL 3350 17 GM/SCOOP PO POWD: 1.0000 | Freq: Once | ORAL | Status: DC

## 2019-07-03 MED ORDER — EPHEDRINE SULFATE-NACL 50-0.9 MG/10ML-% IV SOSY
PREFILLED_SYRINGE | INTRAVENOUS | Status: DC | PRN
  Administered 2019-07-03: 10 mg via INTRAVENOUS

## 2019-07-03 MED ORDER — DEXAMETHASONE SODIUM PHOSPHATE 4 MG/ML IJ SOLN: 4.0000 mg | INTRAMUSCULAR | Status: DC

## 2019-07-03 MED ORDER — MAGIC MOUTHWASH
15.0000 mL | Freq: Four times a day (QID) | ORAL | Status: DC | PRN
  Filled 2019-07-03: qty 15

## 2019-07-03 MED ORDER — LIDOCAINE HCL 2 % IJ SOLN
INTRAMUSCULAR | Status: AC
  Filled 2019-07-03: qty 20

## 2019-07-03 MED ORDER — SUGAMMADEX SODIUM 200 MG/2ML IV SOLN
INTRAVENOUS | Status: DC | PRN
  Administered 2019-07-03: 140 mg via INTRAVENOUS

## 2019-07-03 MED ORDER — SODIUM CHLORIDE 0.9 % IV SOLN
2.0000 g | Freq: Two times a day (BID) | INTRAVENOUS | Status: AC
  Administered 2019-07-04: 2 g via INTRAVENOUS
  Filled 2019-07-03: qty 2

## 2019-07-03 MED ORDER — DIPHENHYDRAMINE HCL 50 MG/ML IJ SOLN
12.5000 mg | Freq: Four times a day (QID) | INTRAMUSCULAR | Status: DC | PRN
  Administered 2019-07-12 – 2019-07-14 (×2): 12.5 mg via INTRAVENOUS
  Filled 2019-07-03 (×2): qty 1

## 2019-07-03 MED ORDER — ENSURE SURGERY PO LIQD
237.0000 mL | Freq: Two times a day (BID) | ORAL | Status: DC
  Administered 2019-07-04 – 2019-07-10 (×7): 237 mL via ORAL
  Filled 2019-07-03 (×16): qty 237

## 2019-07-03 MED ORDER — ACETAMINOPHEN 500 MG PO TABS
1000.0000 mg | ORAL_TABLET | Freq: Four times a day (QID) | ORAL | Status: DC
  Administered 2019-07-04 – 2019-07-05 (×5): 1000 mg via ORAL
  Filled 2019-07-03 (×5): qty 2

## 2019-07-03 MED ORDER — FENTANYL CITRATE (PF) 250 MCG/5ML IJ SOLN
INTRAMUSCULAR | Status: AC
  Filled 2019-07-03: qty 5

## 2019-07-03 MED ORDER — PROCHLORPERAZINE MALEATE 10 MG PO TABS
10.0000 mg | ORAL_TABLET | Freq: Four times a day (QID) | ORAL | Status: DC | PRN
  Administered 2019-07-06: 10 mg via ORAL
  Filled 2019-07-03: qty 1

## 2019-07-03 MED ORDER — ENOXAPARIN SODIUM 40 MG/0.4ML ~~LOC~~ SOLN
40.0000 mg | SUBCUTANEOUS | Status: DC
  Administered 2019-07-06 – 2019-07-15 (×9): 40 mg via SUBCUTANEOUS
  Filled 2019-07-03 (×11): qty 0.4

## 2019-07-03 MED ORDER — ONDANSETRON HCL 4 MG/2ML IJ SOLN
INTRAMUSCULAR | Status: AC
  Filled 2019-07-03: qty 2

## 2019-07-03 MED ORDER — BUPIVACAINE-EPINEPHRINE (PF) 0.25% -1:200000 IJ SOLN
INTRAMUSCULAR | Status: AC
  Filled 2019-07-03: qty 60

## 2019-07-03 MED ORDER — PROCHLORPERAZINE EDISYLATE 10 MG/2ML IJ SOLN
5.0000 mg | Freq: Four times a day (QID) | INTRAMUSCULAR | Status: DC | PRN
  Administered 2019-07-03: 20:00:00 5 mg via INTRAVENOUS
  Administered 2019-07-05 – 2019-07-13 (×12): 10 mg via INTRAVENOUS
  Filled 2019-07-03 (×16): qty 2

## 2019-07-03 MED ORDER — HYDROMORPHONE HCL 1 MG/ML IJ SOLN
0.5000 mg | INTRAMUSCULAR | Status: DC | PRN
  Administered 2019-07-03: 1 mg via INTRAVENOUS
  Administered 2019-07-04 (×3): 2 mg via INTRAVENOUS
  Administered 2019-07-04 – 2019-07-05 (×2): 1 mg via INTRAVENOUS
  Filled 2019-07-03: qty 1
  Filled 2019-07-03 (×5): qty 2
  Filled 2019-07-03: qty 1

## 2019-07-03 MED ORDER — KETAMINE HCL 10 MG/ML IJ SOLN
INTRAMUSCULAR | Status: DC | PRN
  Administered 2019-07-03: 30 mg via INTRAVENOUS

## 2019-07-03 MED ORDER — PROPOFOL 10 MG/ML IV BOLUS
INTRAVENOUS | Status: AC
  Filled 2019-07-03: qty 20

## 2019-07-03 MED ORDER — FENTANYL CITRATE (PF) 100 MCG/2ML IJ SOLN
INTRAMUSCULAR | Status: AC
  Filled 2019-07-03: qty 4

## 2019-07-03 MED ORDER — ONDANSETRON HCL 4 MG/2ML IJ SOLN
4.0000 mg | Freq: Four times a day (QID) | INTRAMUSCULAR | Status: DC | PRN
  Administered 2019-07-05: 4 mg via INTRAVENOUS
  Filled 2019-07-03: qty 2

## 2019-07-03 MED ORDER — LACTATED RINGERS IV SOLN
INTRAVENOUS | Status: DC
  Administered 2019-07-03 (×2): via INTRAVENOUS

## 2019-07-03 MED ORDER — ALUM & MAG HYDROXIDE-SIMETH 200-200-20 MG/5ML PO SUSP
30.0000 mL | Freq: Four times a day (QID) | ORAL | Status: DC | PRN
  Administered 2019-07-10: 30 mL via ORAL
  Filled 2019-07-03: qty 30

## 2019-07-03 MED ORDER — LIDOCAINE 2% (20 MG/ML) 5 ML SYRINGE
INTRAMUSCULAR | Status: DC | PRN
  Administered 2019-07-03: 100 mg via INTRAVENOUS

## 2019-07-03 MED ORDER — HYDROMORPHONE HCL 1 MG/ML IJ SOLN
0.2500 mg | INTRAMUSCULAR | Status: DC | PRN
  Administered 2019-07-03 (×4): 0.5 mg via INTRAVENOUS

## 2019-07-03 MED ORDER — LACTATED RINGERS IV SOLN
INTRAVENOUS | Status: DC
  Administered 2019-07-03: 50 mL/h via INTRAVENOUS

## 2019-07-03 MED ORDER — BUPIVACAINE-EPINEPHRINE (PF) 0.25% -1:200000 IJ SOLN
INTRAMUSCULAR | Status: DC | PRN
  Administered 2019-07-03: 60 mL

## 2019-07-03 MED ORDER — GABAPENTIN 100 MG PO CAPS
200.0000 mg | ORAL_CAPSULE | Freq: Three times a day (TID) | ORAL | Status: DC
  Administered 2019-07-04 (×2): 200 mg via ORAL
  Filled 2019-07-03 (×2): qty 2

## 2019-07-03 MED ORDER — INDOCYANINE GREEN 25 MG IV SOLR
INTRAVENOUS | Status: DC | PRN
  Administered 2019-07-03: 7.5 mg via INTRAVENOUS

## 2019-07-03 MED ORDER — ROCURONIUM BROMIDE 10 MG/ML (PF) SYRINGE
PREFILLED_SYRINGE | INTRAVENOUS | Status: AC
  Filled 2019-07-03: qty 10

## 2019-07-03 MED ORDER — INDOCYANINE GREEN 25 MG IV SOLR: INTRAVENOUS | Status: DC | PRN

## 2019-07-03 MED ORDER — KETOROLAC TROMETHAMINE 30 MG/ML IJ SOLN
INTRAMUSCULAR | Status: AC
  Filled 2019-07-03: qty 1

## 2019-07-03 MED ORDER — ALVIMOPAN 12 MG PO CAPS
12.0000 mg | ORAL_CAPSULE | ORAL | Status: AC
  Administered 2019-07-03: 12:00:00 12 mg via ORAL
  Filled 2019-07-03: qty 1

## 2019-07-03 MED ORDER — SACCHAROMYCES BOULARDII 250 MG PO CAPS
250.0000 mg | ORAL_CAPSULE | Freq: Two times a day (BID) | ORAL | Status: DC
  Administered 2019-07-04 – 2019-07-09 (×12): 250 mg via ORAL
  Filled 2019-07-03 (×12): qty 1

## 2019-07-03 MED ORDER — PHENYLEPHRINE 40 MCG/ML (10ML) SYRINGE FOR IV PUSH (FOR BLOOD PRESSURE SUPPORT)
PREFILLED_SYRINGE | INTRAVENOUS | Status: DC | PRN
  Administered 2019-07-03: 80 ug via INTRAVENOUS

## 2019-07-03 MED ORDER — DEXAMETHASONE SODIUM PHOSPHATE 10 MG/ML IJ SOLN
INTRAMUSCULAR | Status: AC
  Filled 2019-07-03: qty 1

## 2019-07-03 MED ORDER — DIPHENHYDRAMINE HCL 12.5 MG/5ML PO ELIX: 12.5000 mg | ORAL_SOLUTION | Freq: Four times a day (QID) | ORAL | Status: DC | PRN

## 2019-07-03 MED ORDER — SUCCINYLCHOLINE CHLORIDE 200 MG/10ML IV SOSY
PREFILLED_SYRINGE | INTRAVENOUS | Status: DC | PRN
  Administered 2019-07-03: 100 mg via INTRAVENOUS

## 2019-07-03 MED ORDER — ENSURE PRE-SURGERY PO LIQD
592.0000 mL | Freq: Once | ORAL | Status: DC
  Filled 2019-07-03: qty 592

## 2019-07-03 MED ORDER — ONDANSETRON HCL 4 MG/2ML IJ SOLN
INTRAMUSCULAR | Status: DC | PRN
  Administered 2019-07-03 (×2): 4 mg via INTRAVENOUS

## 2019-07-03 MED ORDER — ACETAMINOPHEN 500 MG PO TABS
1000.0000 mg | ORAL_TABLET | ORAL | Status: AC
  Administered 2019-07-03: 1000 mg via ORAL
  Filled 2019-07-03: qty 2

## 2019-07-03 MED ORDER — SODIUM CHLORIDE 0.9 % IV SOLN
2.0000 g | INTRAVENOUS | Status: AC
  Administered 2019-07-03: 2 g via INTRAVENOUS
  Filled 2019-07-03: qty 2

## 2019-07-03 MED ORDER — 0.9 % SODIUM CHLORIDE (POUR BTL) OPTIME
TOPICAL | Status: DC | PRN
  Administered 2019-07-03: 1000 mL

## 2019-07-03 MED ORDER — BUPIVACAINE LIPOSOME 1.3 % IJ SUSP
INTRAMUSCULAR | Status: DC | PRN
  Administered 2019-07-03: 20 mL

## 2019-07-03 MED ORDER — NEOMYCIN SULFATE 500 MG PO TABS: 1000.0000 mg | ORAL_TABLET | ORAL | Status: DC

## 2019-07-03 MED ORDER — METOPROLOL TARTRATE 5 MG/5ML IV SOLN: 5.0000 mg | Freq: Four times a day (QID) | INTRAVENOUS | Status: DC | PRN

## 2019-07-03 MED ORDER — LIP MEDEX EX OINT
1.0000 "application " | TOPICAL_OINTMENT | Freq: Two times a day (BID) | CUTANEOUS | Status: DC
  Administered 2019-07-04 – 2019-07-15 (×19): 1 via TOPICAL
  Filled 2019-07-03 (×3): qty 7

## 2019-07-03 MED ORDER — MIDAZOLAM HCL 2 MG/2ML IJ SOLN
INTRAMUSCULAR | Status: AC
  Filled 2019-07-03: qty 2

## 2019-07-03 MED ORDER — EPHEDRINE 5 MG/ML INJ
INTRAVENOUS | Status: AC
  Filled 2019-07-03: qty 10

## 2019-07-03 MED ORDER — KETOROLAC TROMETHAMINE 30 MG/ML IJ SOLN
30.0000 mg | Freq: Once | INTRAMUSCULAR | Status: AC
  Administered 2019-07-03: 30 mg via INTRAVENOUS

## 2019-07-03 MED ORDER — SCOPOLAMINE 1 MG/3DAYS TD PT72
1.0000 | MEDICATED_PATCH | TRANSDERMAL | Status: DC
  Administered 2019-07-03: 12:00:00 1.5 mg via TRANSDERMAL
  Filled 2019-07-03: qty 1

## 2019-07-03 MED ORDER — LIDOCAINE 2% (20 MG/ML) 5 ML SYRINGE
INTRAMUSCULAR | Status: AC
  Filled 2019-07-03: qty 5

## 2019-07-03 SURGICAL SUPPLY — 104 items
APPLIER CLIP 5 13 M/L LIGAMAX5 (MISCELLANEOUS)
APPLIER CLIP ROT 10 11.4 M/L (STAPLE)
BLADE EXTENDED COATED 6.5IN (ELECTRODE) ×2 IMPLANT
CANNULA REDUC XI 12-8 STAPL (CANNULA) ×1
CANNULA REDUCER 12-8 DVNC XI (CANNULA) ×1 IMPLANT
CELLS DAT CNTRL 66122 CELL SVR (MISCELLANEOUS) IMPLANT
CHLORAPREP W/TINT 26 (MISCELLANEOUS) ×2 IMPLANT
CLIP APPLIE 5 13 M/L LIGAMAX5 (MISCELLANEOUS) IMPLANT
CLIP APPLIE ROT 10 11.4 M/L (STAPLE) IMPLANT
CLIP VESOLOCK LG 6/CT PURPLE (CLIP) IMPLANT
CLIP VESOLOCK MED LG 6/CT (CLIP) IMPLANT
COVER SURGICAL LIGHT HANDLE (MISCELLANEOUS) ×4 IMPLANT
COVER TIP SHEARS 8 DVNC (MISCELLANEOUS) ×1 IMPLANT
COVER TIP SHEARS 8MM DA VINCI (MISCELLANEOUS) ×1
COVER WAND RF STERILE (DRAPES) ×1 IMPLANT
DECANTER SPIKE VIAL GLASS SM (MISCELLANEOUS) ×2 IMPLANT
DEVICE TROCAR PUNCTURE CLOSURE (ENDOMECHANICALS) IMPLANT
DRAIN CHANNEL 19F RND (DRAIN) ×1 IMPLANT
DRAPE ARM DVNC X/XI (DISPOSABLE) ×4 IMPLANT
DRAPE COLUMN DVNC XI (DISPOSABLE) ×1 IMPLANT
DRAPE DA VINCI XI ARM (DISPOSABLE) ×4
DRAPE DA VINCI XI COLUMN (DISPOSABLE) ×1
DRAPE SURG IRRIG POUCH 19X23 (DRAPES) ×2 IMPLANT
DRSG OPSITE POSTOP 4X10 (GAUZE/BANDAGES/DRESSINGS) IMPLANT
DRSG OPSITE POSTOP 4X6 (GAUZE/BANDAGES/DRESSINGS) IMPLANT
DRSG OPSITE POSTOP 4X8 (GAUZE/BANDAGES/DRESSINGS) IMPLANT
DRSG TEGADERM 2-3/8X2-3/4 SM (GAUZE/BANDAGES/DRESSINGS) ×10 IMPLANT
DRSG TEGADERM 4X4.75 (GAUZE/BANDAGES/DRESSINGS) ×2 IMPLANT
ELECT PENCIL ROCKER SW 15FT (MISCELLANEOUS) ×2 IMPLANT
ELECT REM PT RETURN 15FT ADLT (MISCELLANEOUS) ×2 IMPLANT
ENDOLOOP SUT PDS II  0 18 (SUTURE)
ENDOLOOP SUT PDS II 0 18 (SUTURE) IMPLANT
EVACUATOR SILICONE 100CC (DRAIN) ×1 IMPLANT
GAUZE SPONGE 2X2 8PLY STRL LF (GAUZE/BANDAGES/DRESSINGS) ×1 IMPLANT
GAUZE SPONGE 4X4 12PLY STRL (GAUZE/BANDAGES/DRESSINGS) IMPLANT
GLOVE ECLIPSE 8.0 STRL XLNG CF (GLOVE) ×8 IMPLANT
GLOVE INDICATOR 8.0 STRL GRN (GLOVE) ×8 IMPLANT
GOWN STRL REUS W/TWL XL LVL3 (GOWN DISPOSABLE) ×9 IMPLANT
GRASPER SUT TROCAR 14GX15 (MISCELLANEOUS) ×1 IMPLANT
HOLDER FOLEY CATH W/STRAP (MISCELLANEOUS) ×2 IMPLANT
IRRIG SUCT STRYKERFLOW 2 WTIP (MISCELLANEOUS) ×2
IRRIGATION SUCT STRKRFLW 2 WTP (MISCELLANEOUS) IMPLANT
KIT PROCEDURE DA VINCI SI (MISCELLANEOUS) ×1
KIT PROCEDURE DVNC SI (MISCELLANEOUS) ×1 IMPLANT
KIT TURNOVER KIT A (KITS) IMPLANT
NDL INSUFFLATION 14GA 120MM (NEEDLE) ×1 IMPLANT
NEEDLE INSUFFLATION 14GA 120MM (NEEDLE) ×2 IMPLANT
PACK CARDIOVASCULAR III (CUSTOM PROCEDURE TRAY) ×2 IMPLANT
PACK COLON (CUSTOM PROCEDURE TRAY) ×2 IMPLANT
PAD POSITIONING PINK XL (MISCELLANEOUS) ×2 IMPLANT
PORT LAP GEL ALEXIS MED 5-9CM (MISCELLANEOUS) ×2 IMPLANT
PROTECTOR NERVE ULNAR (MISCELLANEOUS) ×4 IMPLANT
RELOAD STAPLE 45 3.5 WHT DVNC (STAPLE) IMPLANT
RELOAD STAPLE 45 BLU REG DVNC (STAPLE) IMPLANT
RELOAD STAPLE 45 GRN THCK DVNC (STAPLE) IMPLANT
RETRACTOR WND ALEXIS 18 MED (MISCELLANEOUS) IMPLANT
RTRCTR WOUND ALEXIS 18CM MED (MISCELLANEOUS)
SCISSORS LAP 5X35 DISP (ENDOMECHANICALS) ×2 IMPLANT
SEAL CANN UNIV 5-8 DVNC XI (MISCELLANEOUS) ×4 IMPLANT
SEAL XI 5MM-8MM UNIVERSAL (MISCELLANEOUS) ×4
SEALER VESSEL DA VINCI XI (MISCELLANEOUS) ×1
SEALER VESSEL EXT DVNC XI (MISCELLANEOUS) ×1 IMPLANT
SLEEVE ADV FIXATION 5X100MM (TROCAR) IMPLANT
SOLUTION ELECTROLUBE (MISCELLANEOUS) ×2 IMPLANT
SPONGE GAUZE 2X2 STER 10/PKG (GAUZE/BANDAGES/DRESSINGS) ×1
STAPLER 45 BLU RELOAD XI (STAPLE) ×2 IMPLANT
STAPLER 45 BLUE RELOAD XI (STAPLE) ×2
STAPLER 45 GREEN RELOAD XI (STAPLE)
STAPLER 45 GRN RELOAD XI (STAPLE) IMPLANT
STAPLER 45 WHITE RELOAD XI (STAPLE) ×3
STAPLER 45 WHT RELOAD XI (STAPLE) ×3 IMPLANT
STAPLER CANNULA SEAL DVNC XI (STAPLE) ×1 IMPLANT
STAPLER CANNULA SEAL XI (STAPLE) ×1
STAPLER SHEATH (SHEATH) ×1
STAPLER SHEATH ENDOWRIST DVNC (SHEATH) ×1 IMPLANT
SUT MNCRL AB 4-0 PS2 18 (SUTURE) ×2 IMPLANT
SUT PDS AB 1 CT1 27 (SUTURE) ×4 IMPLANT
SUT PDS AB 1 TP1 96 (SUTURE) IMPLANT
SUT PROLENE 0 CT 2 (SUTURE) IMPLANT
SUT PROLENE 2 0 KS (SUTURE) IMPLANT
SUT PROLENE 2 0 SH DA (SUTURE) IMPLANT
SUT SILK 2 0 (SUTURE)
SUT SILK 2 0 SH CR/8 (SUTURE) IMPLANT
SUT SILK 2-0 18XBRD TIE 12 (SUTURE) IMPLANT
SUT SILK 3 0 (SUTURE) ×1
SUT SILK 3 0 SH CR/8 (SUTURE) ×2 IMPLANT
SUT SILK 3-0 18XBRD TIE 12 (SUTURE) ×1 IMPLANT
SUT V-LOC BARB 180 2/0GR6 GS22 (SUTURE)
SUT VIC AB 3-0 SH 18 (SUTURE) IMPLANT
SUT VIC AB 3-0 SH 27 (SUTURE)
SUT VIC AB 3-0 SH 27XBRD (SUTURE) IMPLANT
SUT VICRYL 0 UR6 27IN ABS (SUTURE) ×2 IMPLANT
SUT VLOC BARB 180 ABS3/0GR12 (SUTURE) ×4
SUTURE V-LC BRB 180 2/0GR6GS22 (SUTURE) IMPLANT
SUTURE VLOC BRB 180 ABS3/0GR12 (SUTURE) IMPLANT
SYR 10ML ECCENTRIC (SYRINGE) ×2 IMPLANT
SYS LAPSCP GELPORT 120MM (MISCELLANEOUS)
SYSTEM LAPSCP GELPORT 120MM (MISCELLANEOUS) IMPLANT
TAPE UMBILICAL COTTON 1/8X30 (MISCELLANEOUS) ×2 IMPLANT
TOWEL OR NON WOVEN STRL DISP B (DISPOSABLE) ×2 IMPLANT
TRAY FOLEY MTR SLVR 14FR STAT (SET/KITS/TRAYS/PACK) ×1 IMPLANT
TROCAR ADV FIXATION 5X100MM (TROCAR) ×2 IMPLANT
TUBING CONNECTING 10 (TUBING) ×4 IMPLANT
TUBING INSUFFLATION 10FT LAP (TUBING) ×2 IMPLANT

## 2019-07-03 NOTE — Anesthesia Procedure Notes (Signed)
Procedure Name: Intubation Date/Time: 07/03/2019 12:43 PM Performed by: Sharlette Dense, CRNA Patient Re-evaluated:Patient Re-evaluated prior to induction Oxygen Delivery Method: Circle system utilized Preoxygenation: Pre-oxygenation with 100% oxygen Induction Type: IV induction and Rapid sequence Laryngoscope Size: Miller and 2 Grade View: Grade I Tube type: Oral Tube size: 7.0 mm Number of attempts: 1 Airway Equipment and Method: Stylet Placement Confirmation: ETT inserted through vocal cords under direct vision,  positive ETCO2 and breath sounds checked- equal and bilateral Secured at: 20 cm Tube secured with: Tape Dental Injury: Teeth and Oropharynx as per pre-operative assessment

## 2019-07-03 NOTE — Transfer of Care (Signed)
Immediate Anesthesia Transfer of Care Note  Patient: Shelbee R Sheen  Procedure(s) Performed: ROBOTIC PROXIMAL COLECTOMY (Right Abdomen)  Patient Location: PACU  Anesthesia Type:General  Level of Consciousness: drowsy  Airway & Oxygen Therapy: Patient Spontanous Breathing and Patient connected to face mask oxygen  Post-op Assessment: Report given to RN and Post -op Vital signs reviewed and stable  Post vital signs: Reviewed and stable  Last Vitals:  Vitals Value Taken Time  BP 125/68 07/03/19 1546  Temp    Pulse 86 07/03/19 1548  Resp 10 07/03/19 1548  SpO2 100 % 07/03/19 1548  Vitals shown include unvalidated device data.  Last Pain:  Vitals:   07/03/19 1157  TempSrc:   PainSc: 5       Patients Stated Pain Goal: 4 (34/[id number]/94 [id number])  Complications: No apparent anesthesia complications

## 2019-07-03 NOTE — Interval H&P Note (Signed)
History and Physical Interval Note:  07/03/2019 12:04 PM  Christine Brady  has presented today for surgery, with the diagnosis of fecal bascule with volvulus.  The various methods of treatment have been discussed with the patient and family. After consideration of risks, benefits and other options for treatment, the patient has consented to  Procedure(s): ROBOTIC PROXIMAL COLECTOMY (N/A) as a surgical intervention.  The patient's history has been reviewed, patient examined, no change in status, stable for surgery.  I have reviewed the patient's chart and labs.  Questions were answered to the patient's satisfaction.     [name]

## 2019-07-03 NOTE — Op Note (Addendum)
07/03/2019  3:39 PM  PATIENT:  Christine Brady  58 y.o. female  Patient Care Team: Ronita Hipps, MD as PCP - General (Family Medicine) Michael Boston, MD as Consulting Physician (General Surgery) Juanita Craver, MD as Consulting Physician (Gastroenterology)  PRE-OPERATIVE DIAGNOSIS:  CECAL BASCULE WITH INTERMITTENT VOLVULUS  POST-OPERATIVE DIAGNOSIS:  PROXIMAL COLON BASCULE WITH INTERMITTENT RIGHT COLON VOLVULUS  PROCEDURE:   ROBOTIC PROXIMAL COLECTOMY OMENTOPEXY ASSESSMENT OF PERFUSION WITH FIREFLY IMMUNOFLUORESCENCE BILATERAL TAP BLOCK  SURGEON:  [name], MD  ASSISTANT: Kaylyn Lim, MD   ANESTHESIA:   local and general  Nerve block provided with liposomal bupivacaine (Experel) mixed with 0.25% bupivacaine as a Bilateral TAP block x 88mL each side at the level of the transverse abdominis & preperitoneal spaces along the flank at the anterior axillary line, from subcostal ridge to iliac crest under laparoscopic guidance    EBL:  Total I/O In: 1000 [I.V.:1000] Out: 425 [Urine:375; Blood:50]  Delay start of Pharmacological VTE agent (>24hrs) due to surgical blood loss or risk of bleeding:  no  DRAINS: none   SPECIMEN:  Proximal "right" colon  DISPOSITION OF SPECIMEN:  PATHOLOGY  COUNTS:  YES  PLAN OF CARE: Admit to inpatient   PATIENT DISPOSITION:  PACU - hemodynamically stable.  INDICATION:    Pleasant woman with intermittent crampy abdominal pain especially right-sided.  Underwent extensive gastroneurology work-up with negative upper endoscopy.  History of prior cholecystectomy.  Borderline biliary dilatation.  EUS underwhelming for any stricture or stone.  Surgical evaluation made.  Review of CAT scan reveals highly mobile cecum with twisting and torsion.  Suspicion for intermittent volvulus of right colon noted.  Offer for colectomy and minimally invasive abdominal exploration.  I recommended segmental resection:  The anatomy & physiology of the digestive  tract was discussed.  The pathophysiology was discussed.  Natural history risks without surgery was discussed.   I worked to give an overview of the disease and the frequent need to have multispecialty involvement.  I feel the risks of no intervention will lead to serious problems that outweigh the operative risks; therefore, I recommended a partial colectomy to remove the pathology.  Laparoscopic & open techniques were discussed.   Risks such as bleeding, infection, abscess, leak, reoperation, possible ostomy, hernia, heart attack, death, and other risks were discussed.  I noted a good likelihood this will help address the problem.   Goals of post-operative recovery were discussed as well.  We will work to minimize complications.  An educational handout on the pathology was given as well.  Questions were answered.    The patient expresses understanding & wishes to proceed with surgery.  OR FINDINGS:   Patient had fully mobile right colon from cecum to hepatic flexure - no retroperitoneal attachments as would be expected. Very floppy and partially twisted.  Long redundant colon.  No intra-abdominal adhesions.  No appendicitis.  No bowel obstruction.  No true small bowel malrotation.  No ischemia, perforation or strangulation.  No obvious metastatic disease on visceral parietal peritoneum or liver.  It is an ileocolonic anastomosis that rests in the left epigastric region.  DESCRIPTION:   Informed consent was confirmed.  The patient underwent general anaesthesia without difficulty.  The patient was positioned with arms tucked & secured appropriately.  VTE prevention in place.  The patient's abdomen was clipped, prepped, & draped in a sterile fashion.  Surgical timeout confirmed our plan.  The patient was positioned in reverse Trendelenburg.  Abdominal entry was gained using  Varess technique in the  left upper abdomen.  Entry was clean.  I induced carbon dioxide insufflation.  Camera inspection  revealed no injury.  Extra ports were carefully placed under direct laparoscopic visualization.  Quick diagnostic laparoscopy revealed no adhesions.  The colon was diffusely dilated.  Right colon was twisted.  The cecum easily reached from the lower pelvis up to the right diaphragm.  The ascending colon had no lateral sidewall nor retroperitoneal attachments until the hepatic flexure.  Consistent with a presumptive diagnosis of intermittent volvulus.  Xi robot carefully docked.  I mobilized & reflected the greater omentum and small bowel in the upper abdomen.    I was able to elevate the proximal colon to isolate the ileocolonic pedicle.  I scored the ileal mesentery just proximal to that.   I carried that further dissection in a medial to lateral fashion.  I was able to bluntly get into the retro-mesenteric plane on the right side.  I freed the proximal right sided colonic mesentery off the retroperitoneum including the duodenal sweep, pancreatic head, & Gerota's fascia of the right kidney. I was able to get underneath the hepatic flexure.  I was able to get underneath the proximal and mid transverse colon.  Actually was able to free off some superior retroperitoneal attachments of the proximal transverse colon.  Transected towards the hepatic flexure.  I isolated the proximal ileocecal pedicle.  I skeletonized it & transected the vessels using vessel sealer.  I chose an area of the right middle colic pedicle that was shortened where the transverse colon was minimally mobile.  Scored mesentery proximal to that.  Then mobilized the transverse colon off the greater omentum.  Then mobilized the proximal transverse colon in a superior to inferior fashion.  I then proceeded to mobilize the terminal ileum & proximal "right" colon in a lateral to medial fashion.  I mobilized the distal ileal mesentery off its retroperitoneal and pelvic attachments.  I mobilized the ascending colon off It is side wall attachments to the  paracolic gutter and retroperitoneum.   Carefully transected the mid transverse colon mesentery just proximal to the short middle colic pedicle, taking care to stay off the retroperitoneum in a radial fashion.  I then assessed perfusion especially the transverse colon using firefly.  We asked anesthesia to dilute the indocyanine green (ICG) to 10 mL and inject 3 mL intravenously with IV flush.  I switched to the NIR fluorescence (Firefly mode) imaging window on the daVinci platform.  I was able to see good light green visualization of blood vessels with good perfusion of tissues, confirming good tissue perfusion of both ends (distal ileum and mid transverse colon) planned for anastomosis.  When he went ahead and proceeded with transection.  I transected the distal ileal mesentery and then transected at the distal ileum with a white load robotic stapler.  I then transected transverse colon mesentery just proximal to a dominant middle colic arterial pedicle radially.  Transected at the proximal transverse colon with a robotic stapler blue load x2.  We assured hemostasis.   I did a side-to-side stapled anastomosis of ileum to mid-transverse colon using a 55mm robotic stapler x 2 firings in an isoperistaltic fashion.  (Distal stump of ileum to mid transverse colon for the distal end of the anastomosis.  Proximal end of colon stump to more proximal ileum for the proximal end of the anastomosis).  I sewed the common staple channel wound more towards the ileal distal end with an  absorbable suture ( 3-0 V-lock) in a running Fairburn fashion from each corner and meeting in the center.  I did meticulous inspection prove an airtight closure.  I protected the anastomosis line with an anterior omentopexy of greater omentum using V lock suture.  We did reinspection of the abdomen.  Hemostasis was good.   Ureters, retroperitoneum, and bowel uninjured.  The anastomosis looked healthy.   I ran the bowel from the ileocolonic  anastomosis towards the ligament of Treitz and allowed the distal ileum to lay over the right retroperitoneum.  Mesentery laid broadly without any twisting or torsion.  We did a final irrigation of antibiotic solution (900 mg clindamycin/240 mg gentamicin in a liter of crystalloid).  Specimen clamped.  Robot undocked.  Placed a Pfannenstiel incision at the 12 mm stapler port site suprapubic region.  Placed a wound protector through that. Specimen removed without incident.  Ports & wound protector removed.  We changed gloves & redraped the patient per colon SSI prevention protocol.  We aspirated the antibiotic irrigation.  Hemostasis was good.  Sterile unused instruments were used from this point.  I closed the skin at the port sites using Monocryl stitch and sterile dressing.  I closed the extraction wound using a 0 Vicryl vertical peritoneal closure and a #1 PDS transverse anterior rectal fascial closure.  I closed the skin with some interrupted Monocryl stitches.  I placed sterile dressings.     Patient is being extubated go to recovery room. I discussed postop care with the patient in detail the office & in the holding area. Instructions are written.  I discussed operative findings, updated the patient's status, discussed probable steps to recovery, and gave postoperative recommendations to the friend, Diane Mitchem.  Recommendations were made.  Questions were answered.  She expressed understanding & appreciation.   [name], MD, FACS, MASCRS Gastrointestinal and Minimally Invasive Surgery    [address] [place], [place] [address] [phone] Main / Paging [phone] Fax

## 2019-07-03 NOTE — Anesthesia Postprocedure Evaluation (Signed)
Anesthesia Post Note  Patient: Christine Brady  Procedure(s) Performed: ROBOTIC PROXIMAL COLECTOMY (Right Abdomen)     Patient location during evaluation: PACU Anesthesia Type: General Level of consciousness: awake and alert Pain management: pain level controlled Vital Signs Assessment: post-procedure vital signs reviewed and stable Respiratory status: spontaneous breathing, nonlabored ventilation, respiratory function stable and patient connected to nasal cannula oxygen Cardiovascular status: blood pressure returned to baseline and stable Postop Assessment: no apparent nausea or vomiting Anesthetic complications: no    Last Vitals:  Vitals:   07/03/19 1110  BP: 123/80  Pulse: 62  Resp: 16  Temp: 36.7 C  SpO2: 100%    Last Pain:  Vitals:   07/03/19 1157  TempSrc:   PainSc: 5                  Chandani Rogowski

## 2019-07-03 NOTE — Anesthesia Preprocedure Evaluation (Signed)
Anesthesia Evaluation  Patient identified by MRN, date of birth, ID band Patient awake    Reviewed: Allergy & Precautions, H&P , NPO status , Patient's Chart, lab work & pertinent test results  History of Anesthesia Complications (+) PONV  Airway Mallampati: II  TM Distance: >3 FB Neck ROM: Full    Dental  (+) Dental Advisory Given   Pulmonary neg pulmonary ROS,    breath sounds clear to auscultation       Cardiovascular negative cardio ROS   Rhythm:Regular Rate:Normal     Neuro/Psych  Headaches,    GI/Hepatic negative GI ROS, Neg liver ROS,   Endo/Other  negative endocrine ROS  Renal/GU negative Renal ROS     Musculoskeletal  (+) Arthritis ,   Abdominal   Peds  Hematology  (+) Blood dyscrasia (Hx of ITP), ,   Anesthesia Other Findings   Reproductive/Obstetrics                              Anesthesia Physical  Anesthesia Plan  ASA: III  Anesthesia Plan: General   Post-op Pain Management:    Induction: Intravenous  PONV Risk Score and Plan: 4 or greater and Ondansetron, Treatment may vary due to age or medical condition, Dexamethasone, TIVA and Midazolam  Airway Management Planned: Oral ETT  Additional Equipment:   Intra-op Plan:   Post-operative Plan: Extubation in OR  Informed Consent: I have reviewed the patients History and Physical, chart, labs and discussed the procedure including the risks, benefits and alternatives for the proposed anesthesia with the patient or authorized representative who has indicated his/her understanding and acceptance.     Dental advisory given  Plan Discussed with: Anesthesiologist, Surgeon and CRNA  Anesthesia Plan Comments:         Anesthesia Quick Evaluation

## 2019-07-04 ENCOUNTER — Other Ambulatory Visit: Payer: Self-pay

## 2019-07-04 ENCOUNTER — Encounter (HOSPITAL_COMMUNITY): Payer: Self-pay | Admitting: Surgery

## 2019-07-04 LAB — BASIC METABOLIC PANEL
Anion gap: 4 — ABNORMAL LOW (ref 5–15)
BUN: 8 mg/dL (ref 6–20)
CO2: 27 mmol/L (ref 22–32)
Calcium: 7.9 mg/dL — ABNORMAL LOW (ref 8.9–10.3)
Chloride: 106 mmol/L (ref 98–111)
Creatinine, Ser: 0.8 mg/dL (ref 0.44–1.00)
GFR calc Af Amer: >60 mL/min (ref >60)
GFR calc non Af Amer: >60 mL/min (ref >60)
Glucose, Bld: 105 mg/dL — ABNORMAL HIGH (ref 70–99)
Potassium: 4.8 mmol/L (ref 3.5–5.1)
Sodium: 137 mmol/L (ref 135–145)

## 2019-07-04 LAB — CBC
HCT: 27.3 % — ABNORMAL LOW (ref 36.0–46.0)
Hemoglobin: 8.9 g/dL — ABNORMAL LOW (ref 12.0–15.0)
MCH: 32.5 pg (ref 26.0–34.0)
MCHC: 32.6 g/dL (ref 30.0–36.0)
MCV: 99.6 fL (ref 80.0–100.0)
Platelets: 102 10*3/uL — ABNORMAL LOW (ref 150–400)
RBC: 2.74 MIL/uL — ABNORMAL LOW (ref 3.87–5.11)
RDW: 12.7 % (ref 11.5–15.5)
WBC: 7.4 10*3/uL (ref 4.0–10.5)
nRBC: 0 % (ref 0.0–0.2)

## 2019-07-04 LAB — MAGNESIUM: Magnesium: 1.7 mg/dL (ref 1.7–2.4)

## 2019-07-04 MED ORDER — CALCIUM CARBONATE-VITAMIN D 500-200 MG-UNIT PO TABS
2.0000 | ORAL_TABLET | Freq: Every day | ORAL | Status: DC
  Administered 2019-07-04 – 2019-07-09 (×6): 2 via ORAL
  Filled 2019-07-04 (×7): qty 2

## 2019-07-04 MED ORDER — PANTOPRAZOLE SODIUM 40 MG PO TBEC
40.0000 mg | DELAYED_RELEASE_TABLET | Freq: Every day | ORAL | Status: DC
  Administered 2019-07-04 – 2019-07-05 (×2): 40 mg via ORAL
  Filled 2019-07-04 (×2): qty 1

## 2019-07-04 MED ORDER — GABAPENTIN 300 MG PO CAPS
300.0000 mg | ORAL_CAPSULE | Freq: Three times a day (TID) | ORAL | Status: DC
  Administered 2019-07-04 (×2): 300 mg via ORAL
  Filled 2019-07-04 (×2): qty 1

## 2019-07-04 MED ORDER — SODIUM CHLORIDE 0.9 % IV SOLN: 250.0000 mL | INTRAVENOUS | Status: DC | PRN

## 2019-07-04 MED ORDER — SODIUM CHLORIDE 0.9% FLUSH: 3.0000 mL | INTRAVENOUS | Status: DC | PRN

## 2019-07-04 MED ORDER — ESTRADIOL 1 MG PO TABS
1.0000 mg | ORAL_TABLET | Freq: Every day | ORAL | Status: DC
  Administered 2019-07-04 – 2019-07-09 (×5): 1 mg via ORAL
  Filled 2019-07-04 (×7): qty 1

## 2019-07-04 MED ORDER — METOCLOPRAMIDE HCL 5 MG/ML IJ SOLN
5.0000 mg | Freq: Three times a day (TID) | INTRAMUSCULAR | Status: DC | PRN
  Administered 2019-07-06: 5 mg via INTRAVENOUS
  Filled 2019-07-04: qty 2

## 2019-07-04 MED ORDER — SODIUM CHLORIDE 0.9% FLUSH
3.0000 mL | Freq: Two times a day (BID) | INTRAVENOUS | Status: DC
  Administered 2019-07-04 – 2019-07-07 (×4): 3 mL via INTRAVENOUS

## 2019-07-04 MED ORDER — MAGNESIUM SULFATE 2 GM/50ML IV SOLN
2.0000 g | Freq: Once | INTRAVENOUS | Status: AC
  Administered 2019-07-04: 2 g via INTRAVENOUS
  Filled 2019-07-04: qty 50

## 2019-07-04 MED ORDER — SODIUM CHLORIDE 0.9% FLUSH
3.0000 mL | Freq: Two times a day (BID) | INTRAVENOUS | Status: DC
  Administered 2019-07-04 – 2019-07-08 (×6): 3 mL via INTRAVENOUS

## 2019-07-04 MED ORDER — PROGESTERONE MICRONIZED 100 MG PO CAPS
200.0000 mg | ORAL_CAPSULE | Freq: Every day | ORAL | Status: DC
  Administered 2019-07-04 – 2019-07-09 (×5): 200 mg via ORAL
  Filled 2019-07-04 (×7): qty 2

## 2019-07-04 MED ORDER — PSYLLIUM 95 % PO PACK
1.0000 | PACK | Freq: Every day | ORAL | Status: DC
  Administered 2019-07-04 – 2019-07-08 (×5): 1 via ORAL
  Filled 2019-07-04 (×5): qty 1

## 2019-07-04 NOTE — Progress Notes (Signed)
Christine Brady [id number] 06-25-1961  CARE TEAM:  PCP: Ronita Hipps, MD  Outpatient Care Team: Patient Care Team: Ronita Hipps, MD as PCP - General (Family Medicine) Michael Boston, MD as Consulting Physician (General Surgery) Juanita Craver, MD as Consulting Physician (Gastroenterology)  Inpatient Treatment Team: Treatment Team: Attending Provider: Michael Boston, MD; Registered Nurse: Jennye Boroughs, RN; Technician: Leda Quail, NT   Problem List:   Active Problems:   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 07/03/2019   Cecal volvulus ([place])   1 Day Post-Op  07/03/2019  POST-OPERATIVE DIAGNOSIS:  PROXIMAL COLON BASCULE WITH INTERMITTENT RIGHT COLON VOLVULUS  PROCEDURE:   ROBOTIC PROXIMAL COLECTOMY OMENTOPEXY ASSESSMENT OF PERFUSION WITH FIREFLY IMMUNOFLUORESCENCE BILATERAL TAP BLOCK  SURGEON:  [name], MD    Assessment  OK  [facility] Stay = 1 days)  Plan:  -ERAS protocol.  Dysphasia 1 diet.  Advance to solids as tolerated.  Medlock IV fluids.  Lactated Ringer bolus backup as needed  Multimodal nonnarcotic pain regimen.  Narcotics for backup.  Remove Foley catheter.  GERD - PPI BID  Bowel regimen w probiotic  -VTE prophylaxis- SCDs, etc  -mobilize as tolerated to help recovery.  GET HER UP!!    25 minutes spent in review, evaluation, examination, counseling, and coordination of care.  More than 50% of that time was spent in counseling.  07/04/2019    Subjective: (Chief complaint)  Rather sore initially.  Better today.  Has not gotten up to walk.  Tolerating liquids.  Objective:  Vital signs:  Vitals:   07/03/19 2014 07/03/19 2202 07/04/19 0054 07/04/19 0600  BP: 118/66 (!) 99/47 107/65 106/68  Pulse: (!) 57 (!) 59 (!) 58 60  Resp: 16 14 16 17   Temp: 97.6 F (36.4 C) (!) 97.4 F (36.3 C) (!) 97.5 F (36.4 C) 97.6 F (36.4 C)  TempSrc: Axillary Axillary Axillary Axillary  SpO2: 100% 100% 99%  99%  Weight:    63.4 kg  Height:           Intake/Output   Yesterday:  07/21 0701 - 07/22 0700 In: 1532.5 [I.V.:1432.5; IV Piggyback:100] Out: 925 [Urine:875; Blood:50] This shift:  Total I/O In: -  Out: 400 [Urine:400]  Bowel function:  Flatus: No  BM:  No  Drain: (No drain)   Physical Exam:  General: Pt awake/alert/oriented x4 in no acute distress.  Tired but not sickly/toxic. Eyes: PERRL, normal EOM.  Sclera clear.  No icterus Neuro: CN II-XII intact w/o focal sensory/motor deficits. Lymph: No head/neck/groin lymphadenopathy Psych:  No delerium/psychosis/paranoia HENT: Normocephalic, Mucus membranes moist.  No thrush Neck: Supple, No tracheal deviation Chest: No chest wall pain w good excursion CV:  Pulses intact.  Regular rhythm MS: Normal AROM mjr joints.  No obvious deformity  Abdomen: Soft.  Mildy distended.  Mildly tender at incisions only.  No evidence of peritonitis.  No incarcerated hernias.  Old blood and 1 of the lateral port site dressings but dry.  Ext:  No deformity.  No mjr edema.  No cyanosis Skin: No petechiae / purpura  Results:   Cultures: Recent Results (from the past 720 hour(s))  SARS Coronavirus 2 (Performed in [place] hospital lab)     Status: None   Collection Time: 06/08/19 12:21 PM   Specimen: Nasal Swab  Result Value Ref Range Status   SARS Coronavirus 2 NEGATIVE NEGATIVE Final    Comment: (NOTE) SARS-CoV-2 target nucleic acids are NOT DETECTED. The SARS-CoV-2 RNA is generally  detectable in upper and lower respiratory specimens during the acute phase of infection. Negative results do not preclude SARS-CoV-2 infection, do not rule out co-infections with other pathogens, and should not be used as the sole basis for treatment or other patient management decisions. Negative results must be combined with clinical observations, patient history, and epidemiological information. The expected result is Negative. Fact Sheet for  Patients: SugarRoll.be Fact Sheet for Healthcare Providers: https://www.woods-mathews.com/ This test is not yet approved or cleared by the [place] FDA and  has been authorized for detection and/or diagnosis of SARS-CoV-2 by FDA under an Emergency Use Authorization (EUA). This EUA will remain  in effect (meaning this test can be used) for the duration of the COVID-19 declaration under Section 56 4(b)(1) of the Act, 21 U.S.C. section 360bbb-3(b)(1), unless the authorization is terminated or revoked sooner. Performed at [facility], [place] [address], [place], [place] [address]   SARS Coronavirus 2 (Performed in [place] hospital lab)     Status: None   Collection Time: 06/29/19 10:42 AM   Specimen: Nasal Swab  Result Value Ref Range Status   SARS Coronavirus 2 NEGATIVE NEGATIVE Final    Comment: (NOTE) SARS-CoV-2 target nucleic acids are NOT DETECTED. The SARS-CoV-2 RNA is generally detectable in upper and lower respiratory specimens during the acute phase of infection. Negative results do not preclude SARS-CoV-2 infection, do not rule out co-infections with other pathogens, and should not be used as the sole basis for treatment or other patient management decisions. Negative results must be combined with clinical observations, patient history, and epidemiological information. The expected result is Negative. Fact Sheet for Patients: SugarRoll.be Fact Sheet for Healthcare Providers: https://www.woods-mathews.com/ This test is not yet approved or cleared by the [place] FDA and  has been authorized for detection and/or diagnosis of SARS-CoV-2 by FDA under an Emergency Use Authorization (EUA). This EUA will remain  in effect (meaning this test can be used) for the duration of the COVID-19 declaration under Section 56 4(b)(1) of the Act, 21 U.S.C. section 360bbb-3(b)(1), unless the  authorization is terminated or revoked sooner. Performed at [facility], [place] [address] [place], [place] [address]     Labs: Results for orders placed or performed during the hospital encounter of 07/03/19 (from the past 48 hour(s))  Basic metabolic panel     Status: Abnormal   Collection Time: 07/04/19  6:41 AM  Result Value Ref Range   Sodium 137 135 - 145 mmol/L   Potassium 4.8 3.5 - 5.1 mmol/L   Chloride 106 98 - 111 mmol/L   CO2 27 22 - 32 mmol/L   Glucose, Bld 105 (H) 70 - 99 mg/dL   BUN 8 6 - 20 mg/dL   Creatinine, Ser 0.80 0.44 - 1.00 mg/dL   Calcium 7.9 (L) 8.9 - 10.3 mg/dL   GFR calc non Af Amer >60 >60 mL/min   GFR calc Af Amer >60 >60 mL/min   Anion gap 4 (L) 5 - 15    Comment: Performed at [facility], [place] [address], [place], [place] [address]  Magnesium     Status: None   Collection Time: 07/04/19  6:41 AM  Result Value Ref Range   Magnesium 1.7 1.7 - 2.4 mg/dL    Comment: Performed at [facility], [place] [address], [place], [place] [address]    Imaging / Studies: No results found.  Medications / Allergies: per chart  Antibiotics: Anti-infectives (From admission,  onward)   Start     Dose/Rate Route Frequency Ordered Stop   07/04/19 0000  cefoTEtan (CEFOTAN) 2 g in sodium chloride 0.9 % 100 mL IVPB     2 g 200 mL/hr over 30 Minutes Intravenous Every 12 hours 07/03/19 1658 07/04/19 0115   07/03/19 1531  clindamycin (CLEOCIN) 900 mg, gentamicin (GARAMYCIN) 240 mg in sodium chloride 0.9 % 1,000 mL for intraperitoneal lavage  Status:  Discontinued       As needed 07/03/19 1532 07/03/19 1543   07/03/19 1400  neomycin (MYCIFRADIN) tablet 1,000 mg  Status:  Discontinued     1,000 mg Oral 3 times per day 07/03/19 1127 07/03/19 1136   07/03/19 1400  metroNIDAZOLE (FLAGYL) tablet 1,000 mg  Status:  Discontinued     1,000 mg Oral 3 times per day 07/03/19 1127 07/03/19 1136   07/03/19 1130  cefoTEtan (CEFOTAN) 2 g in  sodium chloride 0.9 % 100 mL IVPB     2 g 200 mL/hr over 30 Minutes Intravenous On call to O.R. 07/03/19 1127 07/03/19 1301   07/03/19 0600  clindamycin (CLEOCIN) 900 mg, gentamicin (GARAMYCIN) 240 mg in sodium chloride 0.9 % 1,000 mL for intraperitoneal lavage  Status:  Discontinued      Irrigation To Surgery 07/02/19 0749 07/03/19 1658        Note: Portions of this report may have been transcribed using voice recognition software. Every effort was made to ensure accuracy; however, inadvertent computerized transcription errors may be present.   Any transcriptional errors that result from this process are unintentional.     [name], MD, FACS, MASCRS Gastrointestinal and Minimally Invasive Surgery    [address] [place], [place] [address] [phone] Main / Paging [phone] Fax

## 2019-07-05 LAB — CBC
HCT: 24.6 % — ABNORMAL LOW (ref 36.0–46.0)
Hemoglobin: 7.8 g/dL — ABNORMAL LOW (ref 12.0–15.0)
MCH: 31.8 pg (ref 26.0–34.0)
MCHC: 31.7 g/dL (ref 30.0–36.0)
MCV: 100.4 fL — ABNORMAL HIGH (ref 80.0–100.0)
Platelets: 71 10*3/uL — ABNORMAL LOW (ref 150–400)
RBC: 2.45 MIL/uL — ABNORMAL LOW (ref 3.87–5.11)
RDW: 12.6 % (ref 11.5–15.5)
WBC: 5.7 10*3/uL (ref 4.0–10.5)
nRBC: 0 % (ref 0.0–0.2)

## 2019-07-05 MED ORDER — GABAPENTIN 400 MG PO CAPS
400.0000 mg | ORAL_CAPSULE | Freq: Three times a day (TID) | ORAL | Status: DC
  Administered 2019-07-05 (×3): 400 mg via ORAL
  Filled 2019-07-05 (×3): qty 1

## 2019-07-05 MED ORDER — SCOPOLAMINE 1 MG/3DAYS TD PT72
1.0000 | MEDICATED_PATCH | TRANSDERMAL | Status: DC
  Administered 2019-07-05 – 2019-07-08 (×2): 1.5 mg via TRANSDERMAL
  Filled 2019-07-05 (×2): qty 1

## 2019-07-05 MED ORDER — CELECOXIB 200 MG PO CAPS
200.0000 mg | ORAL_CAPSULE | Freq: Two times a day (BID) | ORAL | Status: AC
  Administered 2019-07-05 – 2019-07-06 (×4): 200 mg via ORAL
  Filled 2019-07-05 (×4): qty 1

## 2019-07-05 MED ORDER — HYDROCODONE-ACETAMINOPHEN 5-325 MG PO TABS: 1.0000 | ORAL_TABLET | Freq: Four times a day (QID) | ORAL | 0 refills | Status: DC | PRN

## 2019-07-05 MED ORDER — METHOCARBAMOL 500 MG PO TABS
1000.0000 mg | ORAL_TABLET | Freq: Four times a day (QID) | ORAL | Status: DC
  Administered 2019-07-05 (×3): 1000 mg via ORAL
  Filled 2019-07-05 (×3): qty 2

## 2019-07-05 MED ORDER — HYDROCODONE-ACETAMINOPHEN 7.5-325 MG PO TABS
1.0000 | ORAL_TABLET | Freq: Four times a day (QID) | ORAL | Status: DC | PRN
  Administered 2019-07-05 – 2019-07-10 (×8): 2 via ORAL
  Filled 2019-07-05 (×8): qty 2

## 2019-07-05 MED ORDER — METHOCARBAMOL 750 MG PO TABS: 750.0000 mg | ORAL_TABLET | Freq: Four times a day (QID) | ORAL | 2 refills | Status: DC | PRN

## 2019-07-05 MED ORDER — ACETAMINOPHEN 500 MG PO TABS
500.0000 mg | ORAL_TABLET | Freq: Four times a day (QID) | ORAL | Status: DC | PRN
  Administered 2019-07-05 – 2019-07-06 (×2): 500 mg via ORAL
  Filled 2019-07-05 (×2): qty 1

## 2019-07-05 MED FILL — HYDROCODON-APAP 5-325: 5-325 | 4 days supply | Qty: 30 | Fill #0

## 2019-07-05 MED FILL — METHOCARBAMOL 750 MG TABS: 750 | 8 days supply | Qty: 30 | Fill #0

## 2019-07-05 NOTE — Progress Notes (Signed)
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  Soft diet.  Advance to solids as tolerated.  Medlock IV fluids.  Lactated Ringer bolus backup as needed.  Check orthostatics.  Check hemoglobin.  Multimodal nonnarcotic pain regimen.  Narcotics for backup.  Switch to Celebrex and methocarbamol.  Increase gabapentin.  Switch to hydrocodone since tramadol 2-week and Tylenol and Dilaudid too much  Headache.  Scopolamine patch.  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  Sshe is not thrombocytopenic.  We will give her Celebrex for a few days while following platelet count and hemoglobin closely.  GERD - PPI BID  Recheck labs.  Magnesium and potassium replaced yesterday for hypokalemia/hypomagnesemia  Bowel regimen w probiotic  -VTE prophylaxis- SCDs, etc  -mobilize as tolerated to help recovery.  GET HER UP!!    25 minutes spent in review, evaluation, examination,  counseling, and coordination of care.  More than 50% of that time was spent in counseling.  07/05/2019    Subjective: (Chief complaint)  Headache with some dizziness.  Walked in hallways but felt tired and shaky.  Worried about bruising on left flank.  Challenged to get pain under control.  Getting jittery with oxycodone.  Tramadol 2-week.  Remembers hydrocodone working after dental surgery.  Objective:  Vital signs:  Vitals:   07/04/19 1340 07/04/19 2110 07/05/19 0451 07/05/19 0453  BP: (!) 96/47 (!) 104/55  (!) 102/51  Pulse: 62 77  76  Resp:  16  16  Temp: (!) 97.4 F (36.3 C) 99.1 F (37.3 C)  98.3 F (36.8 C)  TempSrc: Oral Oral  Oral  SpO2: 99% 100%  100%  Weight:   69 kg   Height:        Last BM Date: 07/02/19  Intake/Output   Yesterday:  07/22 0701 - 07/23 0700 In: 1133 [P.O.:1080; I.V.:3; IV Piggyback:50] Out: 2300 [Urine:2300] This shift:  No intake/output data recorded.  Bowel function:  Flatus: YES  BM:  YES  Drain: (No drain)   Physical Exam:  General: Pt awake/alert/oriented x4 in no acute distress.  Tired but not sickly/toxic. Eyes: PERRL, normal EOM.  Sclera clear.  No icterus Neuro: CN II-XII intact w/o focal sensory/motor deficits. Lymph: No head/neck/groin lymphadenopathy Psych:  No delerium/psychosis/paranoia HENT: Normocephalic, Mucus membranes moist.  No thrush Neck: Supple, No tracheal deviation Chest: No chest wall pain w good excursion CV:  Pulses intact.  Regular rhythm MS: Normal AROM mjr joints.  No obvious deformity  Abdomen: Soft.  Nondistended.  Mildly tender at incisions only.  Moderate ecchymosis along left upper flank.  No evidence of peritonitis.  No incarcerated hernias.  Old blood at left lower quadrant port site only.  Dry.    Ext:  No deformity.  No mjr edema.  No cyanosis Skin: No petechiae / purpura  Results:   Cultures: Recent Results (from the past 720 hour(s))  SARS Coronavirus 2 (Performed in [place] hospital lab)     Status: None   Collection Time: 06/08/19 12:21 PM   Specimen: Nasal Swab  Result Value Ref Range Status   SARS Coronavirus 2 NEGATIVE NEGATIVE Final    Comment: (NOTE) SARS-CoV-2 target nucleic acids are NOT DETECTED. The SARS-CoV-2 RNA is generally detectable in upper and lower respiratory specimens during the acute phase of infection. Negative results do not preclude SARS-CoV-2 infection, do not rule out co-infections with other pathogens, and should not be used as the sole basis for treatment or other patient management decisions. Negative results must be combined with clinical observations, patient history, and epidemiological information. The expected result is Negative. Fact Sheet for Patients: SugarRoll.be Fact Sheet for Healthcare Providers: https://www.woods-mathews.com/ This test is not yet approved or cleared by the [place] FDA and  has been authorized for detection and/or diagnosis of SARS-CoV-2 by FDA under an Emergency Use Authorization (EUA). This EUA will remain  in effect (meaning this test can be used) for the duration of the COVID-19 declaration under Section 56 4(b)(1) of the Act, 21 U.S.C. section 360bbb-3(b)(1), unless the authorization is terminated or revoked sooner. Performed at [facility], [place] [address] [place], [place] [address]   SARS Coronavirus 2 (Performed in [place] hospital lab)     Status: None   Collection Time: 06/29/19 10:42 AM   Specimen: Nasal Swab  Result Value Ref Range Status   SARS Coronavirus 2 NEGATIVE NEGATIVE Final    Comment: (NOTE) SARS-CoV-2 target nucleic acids are NOT DETECTED. The SARS-CoV-2 RNA is generally detectable in upper and lower respiratory specimens during the acute phase of infection. Negative results do not preclude SARS-CoV-2 infection, do not rule out co-infections with other pathogens, and should not be used as the sole basis  for treatment or other patient management decisions. Negative results must be combined with clinical observations, patient history, and epidemiological information. The expected result is Negative. Fact Sheet for Patients: SugarRoll.be Fact Sheet for Healthcare Providers: https://www.woods-mathews.com/ This test is not yet approved or cleared by the [place] FDA and  has been authorized for detection and/or diagnosis of SARS-CoV-2 by FDA under an Emergency Use Authorization (EUA). This EUA will remain  in effect (meaning this test can be used) for the duration of the COVID-19 declaration under Section 56 4(b)(1) of the Act, 21 U.S.C. section 360bbb-3(b)(1), unless the authorization is terminated or revoked sooner. Performed at [facility], [place] [address], [place], [place] [address]     Labs: Results for orders placed or performed during the hospital encounter of 07/03/19 (from the past 48 hour(s))  Basic metabolic panel     Status: Abnormal   Collection Time: 07/04/19  6:41 AM  Result Value Ref Range   Sodium 137 135 - 145 mmol/L   Potassium 4.8 3.5 - 5.1 mmol/L   Chloride 106 98 - 111 mmol/L   CO2 27 22 - 32 mmol/L   Glucose, Bld 105 (H) 70 - 99 mg/dL   BUN 8 6 - 20 mg/dL   Creatinine, Ser 0.80 0.44 - 1.00 mg/dL   Calcium  7.9 (L) 8.9 - 10.3 mg/dL   GFR calc non Af Amer >60 >60 mL/min   GFR calc Af Amer >60 >60 mL/min   Anion gap 4 (L) 5 - 15    Comment: Performed at [facility], [place] [address], [place], [place] [address]  CBC     Status: Abnormal   Collection Time: 07/04/19  6:41 AM  Result Value Ref Range   WBC 7.4 4.0 - 10.5 K/uL   RBC 2.74 (L) 3.87 - 5.11 MIL/uL   Hemoglobin 8.9 (L) 12.0 - 15.0 g/dL   HCT 27.3 (L) 36.0 - 46.0 %   MCV 99.6 80.0 - 100.0 fL   MCH 32.5 26.0 - 34.0 pg   MCHC 32.6 30.0 - 36.0 g/dL   RDW 12.7 11.5 - 15.5 %   Platelets 102 (L) 150 - 400 K/uL    Comment: REPEATED TO  VERIFY PLATELET COUNT CONFIRMED BY SMEAR SPECIMEN CHECKED FOR CLOTS Immature Platelet Fraction may be clinically indicated, consider ordering this additional test [id number]    nRBC 0.0 0.0 - 0.2 %    Comment: Performed at [facility], [place] [address] [place], [place] [address]  Magnesium     Status: None   Collection Time: 07/04/19  6:41 AM  Result Value Ref Range   Magnesium 1.7 1.7 - 2.4 mg/dL    Comment: Performed at [facility], [place] [address], [place], [place] [address]    Imaging / Studies: No results found.  Medications / Allergies: per chart  Antibiotics: Anti-infectives (From admission, onward)   Start     Dose/Rate Route Frequency Ordered Stop   07/04/19 0000  cefoTEtan (CEFOTAN) 2 g in sodium chloride 0.9 % 100 mL IVPB     2 g 200 mL/hr over 30 Minutes Intravenous Every 12 hours 07/03/19 1658 07/04/19 0115   07/03/19 1531  clindamycin (CLEOCIN) 900 mg, gentamicin (GARAMYCIN) 240 mg in sodium chloride 0.9 % 1,000 mL for intraperitoneal lavage  Status:  Discontinued       As needed 07/03/19 1532 07/03/19 1543   07/03/19 1400  neomycin (MYCIFRADIN) tablet 1,000 mg  Status:  Discontinued     1,000 mg Oral 3 times per day 07/03/19 1127 07/03/19 1136   07/03/19 1400  metroNIDAZOLE (FLAGYL) tablet 1,000 mg  Status:  Discontinued     1,000 mg Oral 3 times per day 07/03/19 1127 07/03/19 1136   07/03/19 1130  cefoTEtan (CEFOTAN) 2 g in sodium chloride 0.9 % 100 mL IVPB     2 g 200 mL/hr over 30 Minutes Intravenous On call to O.R. 07/03/19 1127 07/03/19 1301   07/03/19 0600  clindamycin (CLEOCIN) 900 mg, gentamicin (GARAMYCIN) 240 mg in sodium chloride 0.9 % 1,000 mL for intraperitoneal lavage  Status:  Discontinued      Irrigation To Surgery 07/02/19 0749 07/03/19 1658        Note: Portions of this report may have been transcribed using voice recognition software. Every effort was made to ensure accuracy; however, inadvertent  computerized transcription errors may be present.   Any transcriptional errors that result from this process are unintentional.     [name], MD, FACS, MASCRS Gastrointestinal and Minimally Invasive Surgery    [address], [place], [place] [address] [phone] Main / Paging [phone] Fax

## 2019-07-05 NOTE — Progress Notes (Signed)
Pharmacy Brief Note - Alvimopan (Entereg)  The standing order set for alvimopan (Entereg) now includes an automatic order to discontinue the drug after the patient has had a bowel movement. The change was approved by the [place] and the Medical Executive Committee.  This patient has had a bowel movement documented by nursing. Therefore, alvimopan has been discontinued. If there are questions, please contact the pharmacy at [phone].  Thank you   Reuel Boom, PharmD, BCPS [phone] 07/05/2019, 3:29 PM

## 2019-07-06 LAB — CBC
HCT: 28.8 % — ABNORMAL LOW (ref 36.0–46.0)
Hemoglobin: 9.2 g/dL — ABNORMAL LOW (ref 12.0–15.0)
MCH: 32.2 pg (ref 26.0–34.0)
MCHC: 31.9 g/dL (ref 30.0–36.0)
MCV: 100.7 fL — ABNORMAL HIGH (ref 80.0–100.0)
Platelets: 105 10*3/uL — ABNORMAL LOW (ref 150–400)
RBC: 2.86 MIL/uL — ABNORMAL LOW (ref 3.87–5.11)
RDW: 12.7 % (ref 11.5–15.5)
WBC: 7.1 10*3/uL (ref 4.0–10.5)
nRBC: 0 % (ref 0.0–0.2)

## 2019-07-06 LAB — CREATININE, SERUM
Creatinine, Ser: 0.56 mg/dL (ref 0.44–1.00)
GFR calc Af Amer: >60 mL/min (ref >60)
GFR calc non Af Amer: >60 mL/min (ref >60)

## 2019-07-06 LAB — POTASSIUM: Potassium: 4.2 mmol/L (ref 3.5–5.1)

## 2019-07-06 LAB — MAGNESIUM: Magnesium: 1.9 mg/dL (ref 1.7–2.4)

## 2019-07-06 MED ORDER — PROCHLORPERAZINE MALEATE 10 MG PO TABS: 10.0000 mg | ORAL_TABLET | Freq: Three times a day (TID) | ORAL | 2 refills | Status: DC | PRN

## 2019-07-06 MED ORDER — GABAPENTIN 300 MG PO CAPS
300.0000 mg | ORAL_CAPSULE | Freq: Three times a day (TID) | ORAL | Status: DC
  Administered 2019-07-06 – 2019-07-08 (×7): 300 mg via ORAL
  Filled 2019-07-06 (×7): qty 1

## 2019-07-06 MED ORDER — PANTOPRAZOLE SODIUM 40 MG PO TBEC
80.0000 mg | DELAYED_RELEASE_TABLET | Freq: Two times a day (BID) | ORAL | Status: DC
  Administered 2019-07-06 – 2019-07-08 (×5): 80 mg via ORAL
  Filled 2019-07-06 (×5): qty 2

## 2019-07-06 MED ORDER — METOCLOPRAMIDE HCL 5 MG/ML IJ SOLN
5.0000 mg | Freq: Three times a day (TID) | INTRAMUSCULAR | Status: DC | PRN
  Administered 2019-07-06 – 2019-07-09 (×5): 10 mg via INTRAVENOUS
  Filled 2019-07-06 (×5): qty 2

## 2019-07-06 MED ORDER — FENTANYL CITRATE (PF) 100 MCG/2ML IJ SOLN
25.0000 ug | INTRAMUSCULAR | Status: DC | PRN
  Administered 2019-07-06 – 2019-07-08 (×10): 50 ug via INTRAVENOUS
  Filled 2019-07-06 (×10): qty 2

## 2019-07-06 MED ORDER — METHOCARBAMOL 500 MG PO TABS
750.0000 mg | ORAL_TABLET | Freq: Three times a day (TID) | ORAL | Status: DC
  Administered 2019-07-06 – 2019-07-08 (×7): 750 mg via ORAL
  Filled 2019-07-06 (×7): qty 2

## 2019-07-06 NOTE — Progress Notes (Signed)
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  Solids as tolerated.  Medlock IV fluids.  Lactated Ringer bolus backup as needed.  Check orthostatics.  Check hemoglobin.  Multimodal nonnarcotic pain regimen.  Narcotics for backup.  Pain control better with Celebrex and methocarbamol gabapentin.  Back down a little bit.  Hydrocodone seems to help.  Switch from Dilaudid to fentanyl since getting nausea perhaps associated with it.    Nausea control with Zofran.  Compazine seems to work better.  Reglan for backup  Headache.  Scopolamine patch.  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  Sshe is not thrombocytopenic.  We will give her Celebrex for a few days while following platelet count and hemoglobin closely.  GERD - Increase PPI BID  Recheck labs.  Magnesium and potassium replaced yesterday for hypokalemia/hypomagnesemia  Bowel regimen w  probiotic  -VTE prophylaxis- SCDs, etc  -mobilize as tolerated to help recovery.  GET HER UP!!    D/C patient from hospital when patient meets criteria (anticipate in 1-2 day(s)):  Tolerating oral intake well Nausea controlled Ambulating well Adequate pain control without IV medications Urinating  Having flatus Disposition planning in place   25 minutes spent in review, evaluation, examination, counseling, and coordination of care.  More than 50% of that time was spent in counseling.  07/06/2019    Subjective: (Chief complaint)  Pain better control.  Still waking up with severe nausea.  Zofran did not help.  Trying to see if Compazine works.  Showered.  Trying to walk more.  Objective:  Vital signs:  Vitals:   07/05/19 0453 07/05/19 1340 07/05/19 2156 07/06/19 0613  BP: (!) 102/51 (!) 107/51 109/69 (!) 106/56  Pulse: 76 69 72 85  Resp: 16 17 18 18   Temp: 98.3 F (36.8 C) 98.7 F (37.1 C) 97.8 F (36.6 C) 99 F (37.2 C)  TempSrc: Oral Oral Oral Oral  SpO2: 100% 95% 98% 93%  Weight:      Height:        Last BM Date: 07/05/19  Intake/Output   Yesterday:  07/23 0701 - 07/24 0700 In: 1376.7 [P.O.:360; I.V.:1016.7] Out: 2150 [Urine:2150] This shift:  No intake/output data recorded.  Bowel function:  Flatus: YES  BM:  YES  Drain: (No drain)   Physical Exam:  General: Pt awake/alert/oriented x4 in no acute distress.  Tired but not sickly/toxic. Eyes: PERRL, normal EOM.  Sclera clear.  No icterus Neuro: CN II-XII intact w/o focal sensory/motor deficits. Lymph: No head/neck/groin lymphadenopathy Psych:  No delerium/psychosis/paranoia HENT: Normocephalic, Mucus membranes moist.  No thrush Neck: Supple, No tracheal deviation Chest: No chest wall pain w good excursion CV:  Pulses intact.  Regular rhythm MS: Normal AROM mjr joints.  No obvious deformity  Abdomen: Soft.  Nondistended.  Nontender.  Moderate ecchymosis along left upper flank.  No  evidence of peritonitis.  No incarcerated hernias.  Old blood at left lower quadrant port site only.  Dry.    Ext:  No deformity.  No mjr edema.  No cyanosis Skin: No petechiae / purpura  Results:   Cultures: Recent Results (from the past 720 hour(s))  SARS Coronavirus 2 (Performed in [place] hospital lab)     Status: None   Collection Time: 06/08/19 12:21 PM   Specimen: Nasal Swab  Result Value Ref Range Status   SARS Coronavirus 2 NEGATIVE NEGATIVE Final    Comment: (NOTE) SARS-CoV-2 target nucleic acids are NOT DETECTED. The SARS-CoV-2 RNA is generally detectable in upper and lower respiratory specimens during the acute phase of infection. Negative results do not preclude SARS-CoV-2 infection, do not rule out co-infections with other pathogens, and should not be used as the sole basis for treatment or other patient management decisions. Negative results must be combined with clinical observations, patient history, and epidemiological information. The expected result is Negative. Fact Sheet for Patients: SugarRoll.be Fact Sheet for Healthcare Providers: https://www.woods-mathews.com/ This test is not yet approved or cleared by the [place] FDA and  has been authorized for detection and/or diagnosis of SARS-CoV-2 by FDA under an Emergency Use Authorization (EUA). This EUA will remain  in effect (meaning this test can be used) for the duration of the COVID-19 declaration under Section 56 4(b)(1) of the Act, 21 U.S.C. section 360bbb-3(b)(1), unless the authorization is terminated or revoked sooner. Performed at [facility], [place] [address], [place], [place] [address]   SARS Coronavirus 2 (Performed in [place] hospital lab)     Status: None   Collection Time: 06/29/19 10:42 AM   Specimen: Nasal Swab  Result Value Ref Range Status   SARS Coronavirus 2 NEGATIVE NEGATIVE Final    Comment: (NOTE) SARS-CoV-2 target  nucleic acids are NOT DETECTED. The SARS-CoV-2 RNA is generally detectable in upper and lower respiratory specimens during the acute phase of infection. Negative results do not preclude SARS-CoV-2 infection, do not rule out co-infections with other pathogens, and should not be used as the sole basis for treatment or other patient management decisions. Negative results must be combined with clinical observations, patient history, and epidemiological information. The expected result is Negative. Fact Sheet for Patients: SugarRoll.be Fact Sheet for Healthcare Providers: https://www.woods-mathews.com/ This test is not yet approved or cleared by the [place] FDA and  has been authorized for detection and/or diagnosis of SARS-CoV-2 by FDA under an Emergency Use Authorization (EUA). This EUA will remain  in effect (meaning this test can be used) for the duration of the COVID-19 declaration under Section 56 4(b)(1) of the Act, 21 U.S.C. section 360bbb-3(b)(1), unless the authorization is terminated or revoked sooner. Performed at [facility], [place] [address] [place], [place] [address]     Labs: Results for orders placed or performed during the hospital encounter of 07/03/19 (from the past 48 hour(s))  CBC     Status: Abnormal   Collection Time: 07/05/19  8:27 AM  Result Value Ref Range   WBC 5.7 4.0 - 10.5 K/uL   RBC 2.45 (L) 3.87 - 5.11 MIL/uL  Hemoglobin 7.8 (L) 12.0 - 15.0 g/dL   HCT 24.6 (L) 36.0 - 46.0 %   MCV 100.4 (H) 80.0 - 100.0 fL   MCH 31.8 26.0 - 34.0 pg   MCHC 31.7 30.0 - 36.0 g/dL   RDW 12.6 11.5 - 15.5 %   Platelets 71 (L) 150 - 400 K/uL    Comment: REPEATED TO VERIFY Immature Platelet Fraction may be clinically indicated, consider ordering this additional test [id number] CONSISTENT WITH PREVIOUS RESULT    nRBC 0.0 0.0 - 0.2 %    Comment: Performed at [facility], [place] [address]  [place], [place] [address]  CBC     Status: Abnormal   Collection Time: 07/06/19  3:54 AM  Result Value Ref Range   WBC 7.1 4.0 - 10.5 K/uL   RBC 2.86 (L) 3.87 - 5.11 MIL/uL   Hemoglobin 9.2 (L) 12.0 - 15.0 g/dL   HCT 28.8 (L) 36.0 - 46.0 %   MCV 100.7 (H) 80.0 - 100.0 fL   MCH 32.2 26.0 - 34.0 pg   MCHC 31.9 30.0 - 36.0 g/dL   RDW 12.7 11.5 - 15.5 %   Platelets 105 (L) 150 - 400 K/uL    Comment: Immature Platelet Fraction may be clinically indicated, consider ordering this additional test [id number] CONSISTENT WITH PREVIOUS RESULT    nRBC 0.0 0.0 - 0.2 %    Comment: Performed at [facility], [place] [address], [place], [place] [address]  Potassium     Status: None   Collection Time: 07/06/19  3:54 AM  Result Value Ref Range   Potassium 4.2 3.5 - 5.1 mmol/L    Comment: Performed at [facility], [place] [address] [place], [place] [address]  Creatinine, serum     Status: None   Collection Time: 07/06/19  3:54 AM  Result Value Ref Range   Creatinine, Ser 0.56 0.44 - 1.00 mg/dL   GFR calc non Af Amer >60 >60 mL/min   GFR calc Af Amer >60 >60 mL/min    Comment: Performed at [facility], [place] [address], [place], [place] [address]  Magnesium     Status: None   Collection Time: 07/06/19  3:54 AM  Result Value Ref Range   Magnesium 1.9 1.7 - 2.4 mg/dL    Comment: Performed at [facility], [place] [address], [place], [place] [address]    Imaging / Studies: No results found.  Medications / Allergies: per chart  Antibiotics: Anti-infectives (From admission, onward)   Start     Dose/Rate Route Frequency Ordered Stop   07/04/19 0000  cefoTEtan (CEFOTAN) 2 g in sodium chloride 0.9 % 100 mL IVPB     2 g 200 mL/hr over 30 Minutes Intravenous Every 12 hours 07/03/19 1658 07/04/19 0115   07/03/19 1531  clindamycin (CLEOCIN) 900 mg, gentamicin (GARAMYCIN) 240 mg in sodium chloride 0.9 % 1,000 mL for intraperitoneal lavage   Status:  Discontinued       As needed 07/03/19 1532 07/03/19 1543   07/03/19 1400  neomycin (MYCIFRADIN) tablet 1,000 mg  Status:  Discontinued     1,000 mg Oral 3 times per day 07/03/19 1127 07/03/19 1136   07/03/19 1400  metroNIDAZOLE (FLAGYL) tablet 1,000 mg  Status:  Discontinued     1,000 mg Oral 3 times per day 07/03/19 1127 07/03/19 1136   07/03/19 1130  cefoTEtan (CEFOTAN) 2 g in sodium chloride 0.9 % 100 mL IVPB  2 g 200 mL/hr over 30 Minutes Intravenous On call to O.R. 07/03/19 1127 07/03/19 1301   07/03/19 0600  clindamycin (CLEOCIN) 900 mg, gentamicin (GARAMYCIN) 240 mg in sodium chloride 0.9 % 1,000 mL for intraperitoneal lavage  Status:  Discontinued      Irrigation To Surgery 07/02/19 0749 07/03/19 1658        Note: Portions of this report may have been transcribed using voice recognition software. Every effort was made to ensure accuracy; however, inadvertent computerized transcription errors may be present.   Any transcriptional errors that result from this process are unintentional.     [name], MD, FACS, MASCRS Gastrointestinal and Minimally Invasive Surgery    [address], [place], [place] [address] [phone] Main / Paging [phone] Fax

## 2019-07-06 NOTE — Progress Notes (Signed)
Status post robotic colectomy for recurrent diverticulitis.  Path benign. I told the pt the good news

## 2019-07-07 LAB — CBC
HCT: 27.2 % — ABNORMAL LOW (ref 36.0–46.0)
Hemoglobin: 8.6 g/dL — ABNORMAL LOW (ref 12.0–15.0)
MCH: 31.5 pg (ref 26.0–34.0)
MCHC: 31.6 g/dL (ref 30.0–36.0)
MCV: 99.6 fL (ref 80.0–100.0)
Platelets: 95 10*3/uL — ABNORMAL LOW (ref 150–400)
RBC: 2.73 MIL/uL — ABNORMAL LOW (ref 3.87–5.11)
RDW: 12.6 % (ref 11.5–15.5)
WBC: 5.5 10*3/uL (ref 4.0–10.5)
nRBC: 0 % (ref 0.0–0.2)

## 2019-07-07 NOTE — Progress Notes (Signed)
4 Days Post-Op   Subjective/Chief Complaint: Patient reports moderate to severe nausea last night.  Feels slightly better today. Having BM's   Objective: Vital signs in last 24 hours: Temp:  [98 F (36.7 C)-99.3 F (37.4 C)] 99.1 F (37.3 C) (07/25 0555) Pulse Rate:  [68-86] 83 (07/25 0555) Resp:  [16-20] 16 (07/25 0555) BP: (103-125)/(58-67) 103/59 (07/25 0555) SpO2:  [95 %-98 %] 95 % (07/25 0555) Last BM Date: 07/07/19  Intake/Output from previous day: 07/24 0701 - 07/25 0700 In: 590 [P.O.:480; I.V.:110] Out: 1350 [Urine:1350] Intake/Output this shift: No intake/output data recorded.  Exam: Looks a little nauseated this morning Abdomen soft, non-distended  Lab Results:  Recent Labs    07/06/19 0354 07/07/19 0652  WBC 7.1 5.5  HGB 9.2* 8.6*  HCT 28.8* 27.2*  PLT 105* 95*   BMET Recent Labs    07/06/19 0354  K 4.2  CREATININE 0.56   PT/INR No results for input(s): LABPROT, INR in the last 72 hours. ABG No results for input(s): PHART, HCO3 in the last 72 hours.  Invalid input(s): PCO2, PO2  Studies/Results: No results found.  Anti-infectives: Anti-infectives (From admission, onward)   Start     Dose/Rate Route Frequency Ordered Stop   07/04/19 0000  cefoTEtan (CEFOTAN) 2 g in sodium chloride 0.9 % 100 mL IVPB     2 g 200 mL/hr over 30 Minutes Intravenous Every 12 hours 07/03/19 1658 07/04/19 0115   07/03/19 1531  clindamycin (CLEOCIN) 900 mg, gentamicin (GARAMYCIN) 240 mg in sodium chloride 0.9 % 1,000 mL for intraperitoneal lavage  Status:  Discontinued       As needed 07/03/19 1532 07/03/19 1543   07/03/19 1400  neomycin (MYCIFRADIN) tablet 1,000 mg  Status:  Discontinued     1,000 mg Oral 3 times per day 07/03/19 1127 07/03/19 1136   07/03/19 1400  metroNIDAZOLE (FLAGYL) tablet 1,000 mg  Status:  Discontinued     1,000 mg Oral 3 times per day 07/03/19 1127 07/03/19 1136   07/03/19 1130  cefoTEtan (CEFOTAN) 2 g in sodium chloride 0.9 % 100 mL IVPB      2 g 200 mL/hr over 30 Minutes Intravenous On call to O.R. 07/03/19 1127 07/03/19 1301   07/03/19 0600  clindamycin (CLEOCIN) 900 mg, gentamicin (GARAMYCIN) 240 mg in sodium chloride 0.9 % 1,000 mL for intraperitoneal lavage  Status:  Discontinued      Irrigation To Surgery 07/02/19 0749 07/03/19 1658      Assessment/Plan: s/p Procedure(s): ROBOTIC PROXIMAL COLECTOMY (Right)  Continue post op care given nausea Ambulate hgb stable  LOS: 4 days    Christine Brady 07/07/2019

## 2019-07-08 ENCOUNTER — Inpatient Hospital Stay (HOSPITAL_COMMUNITY): Payer: 59

## 2019-07-08 LAB — CBC
HCT: 25.9 % — ABNORMAL LOW (ref 36.0–46.0)
Hemoglobin: 8.4 g/dL — ABNORMAL LOW (ref 12.0–15.0)
MCH: 32.1 pg (ref 26.0–34.0)
MCHC: 32.4 g/dL (ref 30.0–36.0)
MCV: 98.9 fL (ref 80.0–100.0)
Platelets: 112 10*3/uL — ABNORMAL LOW (ref 150–400)
RBC: 2.62 MIL/uL — ABNORMAL LOW (ref 3.87–5.11)
RDW: 13 % (ref 11.5–15.5)
WBC: 5.8 10*3/uL (ref 4.0–10.5)
nRBC: 0 % (ref 0.0–0.2)

## 2019-07-08 MED ORDER — PROMETHAZINE HCL 25 MG/ML IJ SOLN
12.5000 mg | Freq: Four times a day (QID) | INTRAMUSCULAR | Status: DC | PRN
  Administered 2019-07-08 (×2): 25 mg via INTRAVENOUS
  Filled 2019-07-08 (×2): qty 1

## 2019-07-08 NOTE — Progress Notes (Signed)
5 Days Post-Op   Subjective/Chief Complaint: Patient still having moderate pain and nausea Still having BM's    Objective: Vital signs in last 24 hours: Temp:  [98.2 F (36.8 C)-98.8 F (37.1 C)] 98.8 F (37.1 C) (07/26 0610) Pulse Rate:  [81-91] 81 (07/26 0610) Resp:  [14-16] 16 (07/26 0610) BP: (108-111)/(66-69) 108/66 (07/26 0610) SpO2:  [95 %-97 %] 95 % (07/26 0610) Weight:  [66.4 kg] 66.4 kg (07/26 0610) Last BM Date: 07/07/19  Intake/Output from previous day: 07/25 0701 - 07/26 0700 In: 1040 [P.O.:960; I.V.:80] Out: 2100 [Urine:2100] Intake/Output this shift: No intake/output data recorded.  Exam: Up in a chair Awake and alert Abdomen soft, mild tenderness, no peritonitis, +BS  Lab Results:  Recent Labs    07/07/19 0652 07/08/19 0420  WBC 5.5 5.8  HGB 8.6* 8.4*  HCT 27.2* 25.9*  PLT 95* 112*   BMET Recent Labs    07/06/19 0354  K 4.2  CREATININE 0.56   PT/INR No results for input(s): LABPROT, INR in the last 72 hours. ABG No results for input(s): PHART, HCO3 in the last 72 hours.  Invalid input(s): PCO2, PO2  Studies/Results: No results found.  Anti-infectives: Anti-infectives (From admission, onward)   Start     Dose/Rate Route Frequency Ordered Stop   07/04/19 0000  cefoTEtan (CEFOTAN) 2 g in sodium chloride 0.9 % 100 mL IVPB     2 g 200 mL/hr over 30 Minutes Intravenous Every 12 hours 07/03/19 1658 07/04/19 0115   07/03/19 1531  clindamycin (CLEOCIN) 900 mg, gentamicin (GARAMYCIN) 240 mg in sodium chloride 0.9 % 1,000 mL for intraperitoneal lavage  Status:  Discontinued       As needed 07/03/19 1532 07/03/19 1543   07/03/19 1400  neomycin (MYCIFRADIN) tablet 1,000 mg  Status:  Discontinued     1,000 mg Oral 3 times per day 07/03/19 1127 07/03/19 1136   07/03/19 1400  metroNIDAZOLE (FLAGYL) tablet 1,000 mg  Status:  Discontinued     1,000 mg Oral 3 times per day 07/03/19 1127 07/03/19 1136   07/03/19 1130  cefoTEtan (CEFOTAN) 2 g in  sodium chloride 0.9 % 100 mL IVPB     2 g 200 mL/hr over 30 Minutes Intravenous On call to O.R. 07/03/19 1127 07/03/19 1301   07/03/19 0600  clindamycin (CLEOCIN) 900 mg, gentamicin (GARAMYCIN) 240 mg in sodium chloride 0.9 % 1,000 mL for intraperitoneal lavage  Status:  Discontinued      Irrigation To Surgery 07/02/19 0749 07/03/19 1658      Assessment/Plan: s/p Procedure(s): ROBOTIC PROXIMAL COLECTOMY (Right)  Post op ileus Continue current care WBC normal, hgb stable  LOS: 5 days    Christine Brady 07/08/2019

## 2019-07-08 NOTE — Progress Notes (Signed)
Pt continues to have increased pain, nausea and vomiting. Dr. Barry Dienes notified, new order received for KUB and phenergan. Will continue to monitor

## 2019-07-09 ENCOUNTER — Encounter (HOSPITAL_COMMUNITY): Payer: Self-pay | Admitting: Surgery

## 2019-07-09 DIAGNOSIS — R519 Headache, unspecified: Secondary | ICD-10-CM

## 2019-07-09 DIAGNOSIS — K589 Irritable bowel syndrome without diarrhea: Secondary | ICD-10-CM

## 2019-07-09 DIAGNOSIS — K219 Gastro-esophageal reflux disease without esophagitis: Secondary | ICD-10-CM

## 2019-07-09 DIAGNOSIS — N838 Other noninflammatory disorders of ovary, fallopian tube and broad ligament: Secondary | ICD-10-CM

## 2019-07-09 HISTORY — DX: Other noninflammatory disorders of ovary, fallopian tube and broad ligament: N83.8

## 2019-07-09 LAB — COMPREHENSIVE METABOLIC PANEL
ALT: 57 U/L — ABNORMAL HIGH (ref 0–44)
AST: 39 U/L (ref 15–41)
Albumin: 3 g/dL — ABNORMAL LOW (ref 3.5–5.0)
Alkaline Phosphatase: 49 U/L (ref 38–126)
Anion gap: 9 (ref 5–15)
BUN: 12 mg/dL (ref 6–20)
CO2: 28 mmol/L (ref 22–32)
Calcium: 8.9 mg/dL (ref 8.9–10.3)
Chloride: 100 mmol/L (ref 98–111)
Creatinine, Ser: 0.75 mg/dL (ref 0.44–1.00)
GFR calc Af Amer: >60 mL/min (ref >60)
GFR calc non Af Amer: >60 mL/min (ref >60)
Glucose, Bld: 106 mg/dL — ABNORMAL HIGH (ref 70–99)
Potassium: 4.2 mmol/L (ref 3.5–5.1)
Sodium: 137 mmol/L (ref 135–145)
Total Bilirubin: 1.1 mg/dL (ref 0.3–1.2)
Total Protein: 6.2 g/dL — ABNORMAL LOW (ref 6.5–8.1)

## 2019-07-09 LAB — RETICULOCYTES
Immature Retic Fract: 28.8 % — ABNORMAL HIGH (ref 2.3–15.9)
RBC.: 2.87 MIL/uL — ABNORMAL LOW (ref 3.87–5.11)
Retic Count, Absolute: 117.4 10*3/uL (ref 19.0–186.0)
Retic Ct Pct: 4.1 % — ABNORMAL HIGH (ref 0.4–3.1)

## 2019-07-09 LAB — CBC
HCT: 29.5 % — ABNORMAL LOW (ref 36.0–46.0)
Hemoglobin: 9.8 g/dL — ABNORMAL LOW (ref 12.0–15.0)
MCH: 32.3 pg (ref 26.0–34.0)
MCHC: 33.2 g/dL (ref 30.0–36.0)
MCV: 97.4 fL (ref 80.0–100.0)
Platelets: 170 10*3/uL (ref 150–400)
RBC: 3.03 MIL/uL — ABNORMAL LOW (ref 3.87–5.11)
RDW: 13 % (ref 11.5–15.5)
WBC: 7.4 10*3/uL (ref 4.0–10.5)
nRBC: 0 % (ref 0.0–0.2)

## 2019-07-09 LAB — FOLATE: Folate: 9.5 ng/mL (ref >5.9)

## 2019-07-09 LAB — IRON AND TIBC
Iron: 23 ug/dL — ABNORMAL LOW (ref 28–170)
Saturation Ratios: 10 % — ABNORMAL LOW (ref 10.4–31.8)
TIBC: 232 ug/dL — ABNORMAL LOW (ref 250–450)
UIBC: 209 ug/dL

## 2019-07-09 LAB — VITAMIN B12: Vitamin B-12: 320 pg/mL (ref 180–914)

## 2019-07-09 LAB — FERRITIN: Ferritin: 90 ng/mL (ref 11–307)

## 2019-07-09 LAB — LIPASE, BLOOD: Lipase: 19 U/L (ref 11–51)

## 2019-07-09 MED ORDER — SODIUM CHLORIDE 0.9% FLUSH: 10.0000 mL | INTRAVENOUS | Status: DC | PRN

## 2019-07-09 MED ORDER — LACTATED RINGERS IV BOLUS: 1000.0000 mL | Freq: Three times a day (TID) | INTRAVENOUS | Status: AC | PRN

## 2019-07-09 MED ORDER — LINACLOTIDE 145 MCG PO CAPS
290.0000 ug | ORAL_CAPSULE | Freq: Every day | ORAL | Status: DC
  Administered 2019-07-09 – 2019-07-10 (×2): 290 ug via ORAL
  Filled 2019-07-09 (×3): qty 2

## 2019-07-09 MED ORDER — METOCLOPRAMIDE HCL 5 MG/ML IJ SOLN
5.0000 mg | Freq: Three times a day (TID) | INTRAMUSCULAR | Status: DC | PRN
  Administered 2019-07-10: 5 mg via INTRAVENOUS
  Filled 2019-07-09: qty 2

## 2019-07-09 MED ORDER — DEXAMETHASONE SODIUM PHOSPHATE 4 MG/ML IJ SOLN
4.0000 mg | Freq: Two times a day (BID) | INTRAMUSCULAR | Status: AC
  Administered 2019-07-09 – 2019-07-10 (×3): 4 mg via INTRAVENOUS
  Filled 2019-07-09 (×3): qty 1

## 2019-07-09 MED ORDER — SODIUM CHLORIDE 0.9% FLUSH: 10.0000 mL | Freq: Two times a day (BID) | INTRAVENOUS | Status: DC

## 2019-07-09 MED ORDER — SODIUM CHLORIDE 0.9 % IV SOLN
25.0000 mg | Freq: Four times a day (QID) | INTRAVENOUS | Status: DC | PRN
  Administered 2019-07-09 – 2019-07-10 (×2): 25 mg via INTRAVENOUS
  Filled 2019-07-09 (×3): qty 1

## 2019-07-09 MED ORDER — SODIUM CHLORIDE 0.9 % IV SOLN
500.0000 mg | Freq: Once | INTRAVENOUS | Status: AC
  Administered 2019-07-10: 500 mg via INTRAVENOUS
  Filled 2019-07-09: qty 10

## 2019-07-09 MED ORDER — BISACODYL 10 MG RE SUPP
10.0000 mg | Freq: Every day | RECTAL | Status: DC
  Administered 2019-07-09 – 2019-07-15 (×6): 10 mg via RECTAL
  Filled 2019-07-09 (×8): qty 1

## 2019-07-09 MED ORDER — LACTATED RINGERS IV BOLUS
1000.0000 mL | Freq: Once | INTRAVENOUS | Status: AC
  Administered 2019-07-09: 08:00:00 1000 mL via INTRAVENOUS

## 2019-07-09 MED ORDER — KCL IN DEXTROSE-NACL 40-5-0.45 MEQ/L-%-% IV SOLN
INTRAVENOUS | Status: AC
  Administered 2019-07-09 – 2019-07-11 (×4): via INTRAVENOUS
  Filled 2019-07-09 (×4): qty 1000

## 2019-07-09 MED ORDER — PANTOPRAZOLE SODIUM 40 MG PO TBEC
40.0000 mg | DELAYED_RELEASE_TABLET | Freq: Two times a day (BID) | ORAL | Status: DC
  Administered 2019-07-09 – 2019-07-10 (×3): 40 mg via ORAL
  Filled 2019-07-09 (×3): qty 1

## 2019-07-09 MED ORDER — SODIUM CHLORIDE 0.9 % IV SOLN
25.0000 mg | Freq: Once | INTRAVENOUS | Status: AC
  Administered 2019-07-09: 25 mg via INTRAVENOUS
  Filled 2019-07-09: qty 0.5

## 2019-07-09 MED ORDER — IBUPROFEN 200 MG PO TABS: 200.0000 mg | ORAL_TABLET | Freq: Three times a day (TID) | ORAL | Status: DC | PRN

## 2019-07-09 MED ORDER — LORAZEPAM 2 MG/ML IJ SOLN
0.5000 mg | Freq: Three times a day (TID) | INTRAMUSCULAR | Status: DC | PRN
  Administered 2019-07-09 – 2019-07-12 (×6): 1 mg via INTRAVENOUS
  Filled 2019-07-09 (×7): qty 1

## 2019-07-09 MED ORDER — POLYETHYLENE GLYCOL 3350 17 G PO PACK
17.0000 g | PACK | Freq: Every day | ORAL | Status: DC
  Administered 2019-07-09: 09:00:00 17 g via ORAL
  Filled 2019-07-09 (×2): qty 1

## 2019-07-09 MED ORDER — METOCLOPRAMIDE HCL 5 MG/ML IJ SOLN
5.0000 mg | Freq: Three times a day (TID) | INTRAMUSCULAR | Status: DC
  Administered 2019-07-09 – 2019-07-10 (×4): 5 mg via INTRAVENOUS
  Filled 2019-07-09 (×4): qty 2

## 2019-07-09 NOTE — Progress Notes (Addendum)
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  Check electrolytes, lipase, liver function tests, anemia panel.  Standing Reglan since that seems to help the most.  Low dose standing with.  Backup Compazine and Thorazine for backup.  Ondansetron did not seem to help.  Phenergan made her feel worse.   hold scopolamine.  Decadron x24 hours  Stop psyllium.  Switch to home bowel regimen of MiraLAX, probiotic, and Linzess  Clear liquids.  CT scan if worse.  Multimodal nonnarcotic pain regimen.  Tylenol and hydrocodone.  Stop gabapentin and robaxin since not seeming to help.    Headache.  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  She is not thrombocytopenic.  Return to ibuprofen as needed.  Follow platelets  GERD - Increase PPI BID  VTE prophylaxis- SCDs, etc  Mobilize as tolerated to help recovery.  GET HER UP!!    D/C patient  from hospital when patient meets criteria (anticipate in ??? day(s)):  Tolerating oral intake well Nausea controlled Ambulating well Adequate pain control without IV medications Urinating  Having flatus Disposition planning in place   25 minutes spent in review, evaluation, examination, counseling, and coordination of care.  More than 50% of that time was spent in counseling.  07/09/2019    Subjective: (Chief complaint)  Nauseated and retching through weekend.  Having flatus.  Walking hallways.  Worse with Phenergan.  Better with Reglan.  Denies severe abdominal pain, just soreness with retching  Objective:  Vital signs:  Vitals:   07/08/19 1704 07/08/19 2127 07/09/19 0414 07/09/19 0721  BP: 129/79 120/70 127/74   Pulse: 98 90 89   Resp: 16 18 18    Temp: 100.1 F (37.8 C) 99.9 F (37.7 C) 99.3 F (37.4 C)   TempSrc: Oral Oral Oral   SpO2:  94% 92%   Weight:    62.5 kg  Height:        Last BM Date: 07/07/19  Intake/Output   Yesterday:  07/26 0701 - 07/27 0700 In: 240 [P.O.:240] Out: 1100 [Urine:1100] This shift:  No intake/output data recorded.  Bowel function:  Flatus: YES  BM:  YES  Drain: (No drain)   Physical Exam:  General: Pt awake/alert/oriented x4 in mild acute distress.  Tired.  Not toxic Eyes: PERRL, normal EOM.  Sclera clear.  No icterus Neuro: CN II-XII intact w/o focal sensory/motor deficits. Lymph: No head/neck/groin lymphadenopathy Psych:  No delerium/psychosis/paranoia HENT: Normocephalic, Mucus membranes moist.  No thrush Neck: Supple, No tracheal deviation Chest:  No chest wall pain w good excursion CV:  Pulses intact.  Regular rhythm MS: Normal AROM mjr joints.  No obvious deformity  Abdomen: Soft.  Nondistended.  Mildly tender at incisions only.  Moderate ecchymosis along left upper flank -stable and change in color/resolving.  No evidence of peritonitis.  No incarcerated hernias.  Incisions dry.  Ext:  No deformity.  No  mjr edema.  No cyanosis Skin: No petechiae / purpura  Results:   Cultures: Recent Results (from the past 720 hour(s))  SARS Coronavirus 2 (Performed in [facility] hospital lab)     Status: None   Collection Time: 06/29/19 10:42 AM   Specimen: Nasal Swab  Result Value Ref Range Status   SARS Coronavirus 2 NEGATIVE NEGATIVE Final    Comment: (NOTE) SARS-CoV-2 target nucleic acids are NOT DETECTED. The SARS-CoV-2 RNA is generally detectable in upper and lower respiratory specimens during the acute phase of infection. Negative results do not preclude SARS-CoV-2 infection, do not rule out co-infections with other pathogens, and should not be used as the sole basis for treatment or other patient management decisions. Negative results must be combined with clinical observations, patient history, and epidemiological information. The expected result is Negative. Fact Sheet for Patients: SugarRoll.be Fact Sheet for Healthcare Providers: https://www.woods-mathews.com/ This test is not yet approved or cleared by the [place] FDA and  has been authorized for detection and/or diagnosis of SARS-CoV-2 by FDA under an Emergency Use Authorization (EUA). This EUA will remain  in effect (meaning this test can be used) for the duration of the COVID-19 declaration under Section 56 4(b)(1) of the Act, 21 U.S.C. section 360bbb-3(b)(1), unless the authorization is terminated or revoked sooner. Performed at [facility], [place] [address] [place], [place] [address]     Labs: Results for orders placed or performed during the hospital encounter of 07/03/19 (from the past 48 hour(s))  CBC     Status: Abnormal   Collection Time: 07/08/19  4:20 AM  Result Value Ref Range   WBC 5.8 4.0 - 10.5 K/uL   RBC 2.62 (L) 3.87 - 5.11 MIL/uL   Hemoglobin 8.4 (L) 12.0 - 15.0 g/dL   HCT 25.9 (L) 36.0 - 46.0 %   MCV 98.9 80.0 - 100.0 fL   MCH 32.1 26.0 - 34.0 pg    MCHC 32.4 30.0 - 36.0 g/dL   RDW 13.0 11.5 - 15.5 %   Platelets 112 (L) 150 - 400 K/uL    Comment: REPEATED TO VERIFY Immature Platelet Fraction may be clinically indicated, consider ordering this additional test [id number] CONSISTENT WITH PREVIOUS RESULT    nRBC 0.0 0.0 - 0.2 %    Comment: Performed at [facility], [place] [address], [place], [place] [address]  CBC     Status: Abnormal   Collection Time: 07/09/19  3:33 AM  Result Value Ref Range   WBC 7.4 4.0 - 10.5 K/uL   RBC 3.03 (L) 3.87 - 5.11 MIL/uL   Hemoglobin 9.8 (L) 12.0 - 15.0 g/dL   HCT 29.5 (L) 36.0 - 46.0 %   MCV 97.4 80.0 - 100.0 fL   MCH 32.3 26.0 - 34.0 pg   MCHC 33.2 30.0 - 36.0 g/dL   RDW 13.0 11.5 - 15.5 %   Platelets 170 150 - 400 K/uL   nRBC 0.0 0.0 - 0.2 %    Comment: Performed at [facility], [place] [address], [place], [place] [address]    Imaging /  Studies: Dg Abd 1 View  Result Date: 07/08/2019 CLINICAL DATA:  Abdominal pain and swelling EXAM: ABDOMEN - 1 VIEW COMPARISON:  None FINDINGS: Numerous air-filled distended small bowel loops throughout abdomen and pelvis. Scattered less prominent gas in colon to rectum. Findings may either represent small-bowel obstruction orileus. Surgical clips RIGHT upper quadrant question cholecystectomy. Anastomotic staple line in LEFT upper quadrant. No definite bowel wall thickening or intra-abdominal mass effect. Osseous structures unremarkable. IMPRESSION: Air-filled distended small bowel throughout abdomen, with less gas in colon to rectum, question ileus versus obstruction. Electronically Signed   By: Lavonia Dana M.D.   On: 07/08/2019 16:15    Medications / Allergies: per chart  Antibiotics: Anti-infectives (From admission, onward)   Start     Dose/Rate Route Frequency Ordered Stop   07/04/19 0000  cefoTEtan (CEFOTAN) 2 g in sodium chloride 0.9 % 100 mL IVPB     2 g 200 mL/hr over 30 Minutes Intravenous Every 12 hours 07/03/19  1658 07/04/19 0115   07/03/19 1531  clindamycin (CLEOCIN) 900 mg, gentamicin (GARAMYCIN) 240 mg in sodium chloride 0.9 % 1,000 mL for intraperitoneal lavage  Status:  Discontinued       As needed 07/03/19 1532 07/03/19 1543   07/03/19 1400  neomycin (MYCIFRADIN) tablet 1,000 mg  Status:  Discontinued     1,000 mg Oral 3 times per day 07/03/19 1127 07/03/19 1136   07/03/19 1400  metroNIDAZOLE (FLAGYL) tablet 1,000 mg  Status:  Discontinued     1,000 mg Oral 3 times per day 07/03/19 1127 07/03/19 1136   07/03/19 1130  cefoTEtan (CEFOTAN) 2 g in sodium chloride 0.9 % 100 mL IVPB     2 g 200 mL/hr over 30 Minutes Intravenous On call to O.R. 07/03/19 1127 07/03/19 1301   07/03/19 0600  clindamycin (CLEOCIN) 900 mg, gentamicin (GARAMYCIN) 240 mg in sodium chloride 0.9 % 1,000 mL for intraperitoneal lavage  Status:  Discontinued      Irrigation To Surgery 07/02/19 0749 07/03/19 1658        Note: Portions of this report may have been transcribed using voice recognition software. Every effort was made to ensure accuracy; however, inadvertent computerized transcription errors may be present.   Any transcriptional errors that result from this process are unintentional.     [name], MD, FACS, MASCRS Gastrointestinal and Minimally Invasive Surgery    [address] [place], [place] [address] [phone] Main / Paging [phone] Fax

## 2019-07-10 ENCOUNTER — Inpatient Hospital Stay (HOSPITAL_COMMUNITY): Payer: 59

## 2019-07-10 LAB — CBC
HCT: 27.8 % — ABNORMAL LOW (ref 36.0–46.0)
Hemoglobin: 9.3 g/dL — ABNORMAL LOW (ref 12.0–15.0)
MCH: 32.5 pg (ref 26.0–34.0)
MCHC: 33.5 g/dL (ref 30.0–36.0)
MCV: 97.2 fL (ref 80.0–100.0)
Platelets: 185 10*3/uL (ref 150–400)
RBC: 2.86 MIL/uL — ABNORMAL LOW (ref 3.87–5.11)
RDW: 13.1 % (ref 11.5–15.5)
WBC: 7.2 10*3/uL (ref 4.0–10.5)
nRBC: 0 % (ref 0.0–0.2)

## 2019-07-10 LAB — CREATININE, SERUM
Creatinine, Ser: 0.61 mg/dL (ref 0.44–1.00)
GFR calc Af Amer: >60 mL/min (ref >60)
GFR calc non Af Amer: >60 mL/min (ref >60)

## 2019-07-10 LAB — PREALBUMIN: Prealbumin: 15.6 mg/dL — ABNORMAL LOW (ref 18–38)

## 2019-07-10 LAB — POTASSIUM: Potassium: 4 mmol/L (ref 3.5–5.1)

## 2019-07-10 LAB — MAGNESIUM: Magnesium: 1.9 mg/dL (ref 1.7–2.4)

## 2019-07-10 MED ORDER — METOCLOPRAMIDE HCL 5 MG/ML IJ SOLN
10.0000 mg | Freq: Four times a day (QID) | INTRAMUSCULAR | Status: DC
  Administered 2019-07-10: 10 mg via INTRAVENOUS
  Filled 2019-07-10: qty 2

## 2019-07-10 MED ORDER — KETOROLAC TROMETHAMINE 15 MG/ML IJ SOLN: 15.0000 mg | Freq: Four times a day (QID) | INTRAMUSCULAR | Status: DC

## 2019-07-10 MED ORDER — SODIUM CHLORIDE 0.9 % IV SOLN: 250.0000 mL | INTRAVENOUS | Status: DC | PRN

## 2019-07-10 MED ORDER — SODIUM CHLORIDE 0.9% FLUSH: 3.0000 mL | Freq: Two times a day (BID) | INTRAVENOUS | Status: DC

## 2019-07-10 MED ORDER — SODIUM CHLORIDE 0.9 % IV SOLN
8.0000 mg | Freq: Four times a day (QID) | INTRAVENOUS | Status: DC | PRN
  Administered 2019-07-12: 8 mg via INTRAVENOUS
  Filled 2019-07-10 (×2): qty 4

## 2019-07-10 MED ORDER — KETOROLAC TROMETHAMINE 15 MG/ML IJ SOLN
15.0000 mg | Freq: Four times a day (QID) | INTRAMUSCULAR | Status: AC | PRN
  Administered 2019-07-10 – 2019-07-14 (×4): 15 mg via INTRAVENOUS
  Filled 2019-07-10 (×6): qty 1

## 2019-07-10 MED ORDER — PHENOL 1.4 % MT LIQD
1.0000 | OROMUCOSAL | Status: DC | PRN
  Administered 2019-07-10 – 2019-07-11 (×2): 1 via OROMUCOSAL
  Filled 2019-07-10: qty 177

## 2019-07-10 MED ORDER — SODIUM CHLORIDE 0.9 % IV SOLN
8.0000 mg | Freq: Four times a day (QID) | INTRAVENOUS | Status: DC | PRN
  Administered 2019-07-10 – 2019-07-13 (×2): 8 mg via INTRAVENOUS
  Filled 2019-07-10 (×3): qty 4

## 2019-07-10 MED ORDER — SODIUM CHLORIDE 0.9% FLUSH: 3.0000 mL | INTRAVENOUS | Status: DC | PRN

## 2019-07-10 MED ORDER — ONDANSETRON HCL 4 MG/2ML IJ SOLN
INTRAMUSCULAR | Status: AC
  Filled 2019-07-10: qty 4

## 2019-07-10 MED ORDER — PANTOPRAZOLE SODIUM 40 MG PO TBEC
80.0000 mg | DELAYED_RELEASE_TABLET | Freq: Two times a day (BID) | ORAL | Status: DC
  Administered 2019-07-10: 17:00:00 80 mg via ORAL
  Filled 2019-07-10: qty 2

## 2019-07-10 NOTE — Progress Notes (Signed)
Iron test dose started 1 hour ago. No adverse effects or reaction reported by patient. Will notify pharmacy to send full dose and continue to monitor.

## 2019-07-10 NOTE — Progress Notes (Signed)
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 Active Problems:   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HCC)   History of postoperative nausea   Cecal volvulus (HCC)   IBS (irritable bowel syndrome)   Headache   GERD (gastroesophageal reflux disease)   7 Days Post-Op  07/03/2019  POST-OPERATIVE DIAGNOSIS:  PROXIMAL COLON BASCULE WITH INTERMITTENT RIGHT COLON VOLVULUS  PROCEDURE:   ROBOTIC PROXIMAL COLECTOMY OMENTOPEXY ASSESSMENT OF PERFUSION WITH FIREFLY IMMUNOFLUORESCENCE BILATERAL TAP BLOCK  SURGEON:  [name], MD    Assessment  Ileus w chronic nausea - resolving  [facility] Stay = 7 days)  Plan:  Re-advance diet.  Wean off IV fluids.  electrolytes, lipase, liver function tests normal and underwhelming.  Acute postoperative anemia on top of some chronic anemia.  Low iron.  Iron deficiency.  IV iron given given poor p.o. tolerance and IBS with oral iron.  Continue Reglan since that seems to help the most.  Low dose standing with.  Backup Compazine and Thorazine for backup.  Ondansetron did not seem to help.  Phenergan made her feel worse.   hold scopolamine.  Decadron x24 hours  Home bowel regimen of MiraLAX, probiotic, and Linzess  CT scan if worse.  Multimodal nonnarcotic pain regimen.  Tylenol and hydrocodone.  Stop gabapentin  and robaxin since not seeming to help.    H/o Headache.  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  She is not thrombocytopenic.  Return to ibuprofen as needed.  Follow platelets  GERD - Increase PPI BID  VTE prophylaxis- SCDs, etc  Mobilize as tolerated to help recovery.  GET HER UP!!    D/C patient from hospital when patient meets criteria (anticipate in 1-2 day(s)):  Tolerating oral intake well Nausea controlled Ambulating well Adequate pain control without IV medications Urinating  Having flatus Disposition planning in place   25 minutes spent in review, evaluation, examination, counseling, and coordination of care.  More than 50% of that time was spent in counseling.  07/10/2019    Subjective: (Chief complaint)  Felt much better yesterday.  Moved bowels.  Some nausea and vertigo standing up and walking at first but better.  More motivated to walk today  Objective:  Vital signs:  Vitals:   07/09/19 0721 07/09/19 1342 07/09/19 2058 07/10/19 0515  BP:  117/67 113/67 119/78  Pulse:  73 98 89  Resp:  18 18 16   Temp:  (!) 97.4 F (36.3 C) 99.5 F (37.5 C) 99 F (37.2 C)  TempSrc:  Oral Oral Oral  SpO2:  97% 95% 95%  Weight: 62.5 kg     Height:        Last BM Date: 07/09/19  Intake/Output   Yesterday:  07/27 0701 - 07/28 0700 In: 3091.8 [P.O.:600; I.V.:1200.5; IV Piggyback:1291.3] Out: 1002 [Urine:1001; Stool:1] This shift:  No intake/output data recorded.  Bowel function:  Flatus: YES  BM:  YES  Drain: (No drain)   Physical Exam:  General: Pt awake/alert/oriented x4 in no acute distress.  Tired.  Not toxic.  Seems more calm today. Eyes: PERRL, normal EOM.  Sclera clear.  No icterus Neuro: CN II-XII intact w/o focal sensory/motor deficits. Lymph: No head/neck/groin lymphadenopathy Psych:  No delerium/psychosis/paranoia HENT: Normocephalic, Mucus membranes moist.  No thrush Neck: Supple, No  tracheal deviation Chest: No chest wall pain w good excursion CV:  Pulses intact.  Regular rhythm MS: Normal AROM mjr joints.  No obvious deformity  Abdomen: Soft.  Nondistended.  Mildly tender at incisions only.  Moderate ecchymosis along left upper flank and anterior abdominal wall-stable and change in color/resolving.  No evidence of peritonitis.  No incarcerated hernias.  Incisions dry.  Ext:  No deformity.  No mjr edema.  No cyanosis Skin: No petechiae / purpura  Results:   Cultures: Recent Results (from the past 720 hour(s))  SARS Coronavirus 2 (Performed in [place] hospital lab)     Status: None   Collection Time: 06/29/19 10:42 AM   Specimen: Nasal Swab  Result Value Ref Range Status   SARS Coronavirus 2 NEGATIVE NEGATIVE Final    Comment: (NOTE) SARS-CoV-2 target nucleic acids are NOT DETECTED. The SARS-CoV-2 RNA is generally detectable in upper and lower respiratory specimens during the acute phase of infection. Negative results do not preclude SARS-CoV-2 infection, do not rule out co-infections with other pathogens, and should not be used as the sole basis for treatment or other patient management decisions. Negative results must be combined with clinical observations, patient history, and epidemiological information. The expected result is Negative. Fact Sheet for Patients: SugarRoll.be Fact Sheet for Healthcare Providers: https://www.woods-mathews.com/ This test is not yet approved or cleared by the [place] FDA and  has been authorized for detection and/or diagnosis of SARS-CoV-2 by FDA under an Emergency Use Authorization (EUA). This EUA will remain  in effect (meaning this test can be used) for the duration of the COVID-19 declaration under Section 56 4(b)(1) of the Act, 21 U.S.C. section 360bbb-3(b)(1), unless the authorization is terminated or revoked sooner. Performed at [facility], [place] [address] [place], [place] [address]     Labs: Results for orders placed or performed during the hospital encounter of 07/03/19 (from the past 48 hour(s))  CBC     Status: Abnormal   Collection Time: 07/09/19  3:33 AM  Result Value Ref Range   WBC 7.4 4.0 - 10.5 K/uL   RBC 3.03 (L) 3.87 - 5.11 MIL/uL   Hemoglobin 9.8 (L) 12.0 - 15.0 g/dL   HCT 29.5 (L) 36.0 - 46.0 %   MCV 97.4 80.0 - 100.0 fL   MCH 32.3 26.0 - 34.0 pg   MCHC 33.2 30.0 - 36.0 g/dL   RDW 13.0 11.5 - 15.5 %   Platelets 170 150 - 400 K/uL   nRBC 0.0 0.0 - 0.2 %    Comment: Performed at [facility], [place] [address] [place], [place] [address]  Comprehensive metabolic panel     Status: Abnormal   Collection Time: 07/09/19  8:09 AM  Result Value Ref Range   Sodium 137 135 - 145 mmol/L   Potassium 4.2 3.5 - 5.1 mmol/L   Chloride 100 98 - 111 mmol/L   CO2 28 22 - 32 mmol/L   Glucose, Bld 106 (H) 70 - 99 mg/dL   BUN 12 6 - 20 mg/dL   Creatinine, Ser 0.75 0.44 - 1.00 mg/dL  Calcium 8.9 8.9 - 10.3 mg/dL   Total Protein 6.2 (L) 6.5 - 8.1 g/dL   Albumin 3.0 (L) 3.5 - 5.0 g/dL   AST 39 15 - 41 U/L   ALT 57 (H) 0 - 44 U/L   Alkaline Phosphatase 49 38 - 126 U/L   Total Bilirubin 1.1 0.3 - 1.2 mg/dL   GFR calc non Af Amer >60 >60 mL/min   GFR calc Af Amer >60 >60 mL/min   Anion gap 9 5 - 15    Comment: Performed at [facility], [place] [address] [place], [place] [address]  Lipase, blood     Status: None   Collection Time: 07/09/19  8:09 AM  Result Value Ref Range   Lipase 19 11 - 51 U/L    Comment: Performed at [facility], [place] [address] [place], [place] [address]  Vitamin B12     Status: None   Collection Time: 07/09/19  8:09 AM  Result Value Ref Range   Vitamin B-12 320 180 - 914 pg/mL    Comment: (NOTE) This assay is not validated for testing neonatal or myeloproliferative syndrome specimens for Vitamin B12 levels. Performed at [facility],  [place] [address] [place], [place] [address]   Folate     Status: None   Collection Time: 07/09/19  8:09 AM  Result Value Ref Range   Folate 9.5 >5.9 ng/mL    Comment: Performed at [facility], [place] [address], [place], [place] [address]  Iron and TIBC     Status: Abnormal   Collection Time: 07/09/19  8:09 AM  Result Value Ref Range   Iron 23 (L) 28 - 170 ug/dL   TIBC 232 (L) 250 - 450 ug/dL   Saturation Ratios 10 (L) 10.4 - 31.8 %   UIBC 209 ug/dL    Comment: Performed at [facility], [place] [address] [place], [place] [address]  Ferritin     Status: None   Collection Time: 07/09/19  8:09 AM  Result Value Ref Range   Ferritin 90 11 - 307 ng/mL    Comment: Performed at [facility], [place] [address], [place], [place] [address]  Reticulocytes     Status: Abnormal   Collection Time: 07/09/19  8:09 AM  Result Value Ref Range   Retic Ct Pct 4.1 (H) 0.4 - 3.1 %   RBC. 2.87 (L) 3.87 - 5.11 MIL/uL   Retic Count, Absolute 117.4 19.0 - 186.0 K/uL   Immature Retic Fract 28.8 (H) 2.3 - 15.9 %    Comment: Performed at [facility], [place] [address] [place], [place] [address]  Prealbumin     Status: Abnormal   Collection Time: 07/10/19 12:52 AM  Result Value Ref Range   Prealbumin 15.6 (L) 18 - 38 mg/dL    Comment: Performed at [facility], [place] [address], [place], [place] [address]  Creatinine, serum     Status: None   Collection Time: 07/10/19  6:06 AM  Result Value Ref Range   Creatinine, Ser 0.61 0.44 - 1.00 mg/dL   GFR calc non Af Amer >60 >60 mL/min   GFR calc Af Amer >60 >60 mL/min    Comment: Performed at [facility], [place] [address], [place],  [address]  CBC     Status: Abnormal   Collection Time: 07/10/19  6:06 AM  Result Value Ref Range   WBC 7.2 4.0 -  10.5 K/uL   RBC 2.86 (L) 3.87 - 5.11 MIL/uL   Hemoglobin 9.3 (L) 12.0 - 15.0 g/dL   HCT 27.8 (L) 36.0 -  46.0 %   MCV 97.2 80.0 - 100.0 fL   MCH 32.5 26.0 - 34.0 pg   MCHC 33.5 30.0 - 36.0 g/dL   RDW 13.1 11.5 - 15.5 %   Platelets 185 150 - 400 K/uL   nRBC 0.0 0.0 - 0.2 %    Comment: Performed at [facility], [place] [address] [place], [place] [address]  Magnesium     Status: None   Collection Time: 07/10/19  6:06 AM  Result Value Ref Range   Magnesium 1.9 1.7 - 2.4 mg/dL    Comment: Performed at [facility], [place] [address], [place], [place] [address]  Potassium     Status: None   Collection Time: 07/10/19  6:06 AM  Result Value Ref Range   Potassium 4.0 3.5 - 5.1 mmol/L    Comment: Performed at [facility], [place] [address] [place], [place] [address]    Imaging / Studies: Dg Abd 1 View  Result Date: 07/08/2019 CLINICAL DATA:  Abdominal pain and swelling EXAM: ABDOMEN - 1 VIEW COMPARISON:  None FINDINGS: Numerous air-filled distended small bowel loops throughout abdomen and pelvis. Scattered less prominent gas in colon to rectum. Findings may either represent small-bowel obstruction orileus. Surgical clips RIGHT upper quadrant question cholecystectomy. Anastomotic staple line in LEFT upper quadrant. No definite bowel wall thickening or intra-abdominal mass effect. Osseous structures unremarkable. IMPRESSION: Air-filled distended small bowel throughout abdomen, with less gas in colon to rectum, question ileus versus obstruction. Electronically Signed   By: Lavonia Dana M.D.   On: 07/08/2019 16:15    Medications / Allergies: per chart  Antibiotics: Anti-infectives (From admission, onward)   Start     Dose/Rate Route Frequency Ordered Stop   07/04/19 0000  cefoTEtan (CEFOTAN) 2 g in sodium chloride 0.9 % 100 mL IVPB     2 g 200 mL/hr over 30 Minutes Intravenous Every 12 hours 07/03/19 1658 07/04/19 0115   07/03/19 1531  clindamycin (CLEOCIN) 900 mg, gentamicin (GARAMYCIN) 240 mg in sodium chloride 0.9 % 1,000 mL for intraperitoneal  lavage  Status:  Discontinued       As needed 07/03/19 1532 07/03/19 1543   07/03/19 1400  neomycin (MYCIFRADIN) tablet 1,000 mg  Status:  Discontinued     1,000 mg Oral 3 times per day 07/03/19 1127 07/03/19 1136   07/03/19 1400  metroNIDAZOLE (FLAGYL) tablet 1,000 mg  Status:  Discontinued     1,000 mg Oral 3 times per day 07/03/19 1127 07/03/19 1136   07/03/19 1130  cefoTEtan (CEFOTAN) 2 g in sodium chloride 0.9 % 100 mL IVPB     2 g 200 mL/hr over 30 Minutes Intravenous On call to O.R. 07/03/19 1127 07/03/19 1301   07/03/19 0600  clindamycin (CLEOCIN) 900 mg, gentamicin (GARAMYCIN) 240 mg in sodium chloride 0.9 % 1,000 mL for intraperitoneal lavage  Status:  Discontinued      Irrigation To Surgery 07/02/19 0749 07/03/19 1658        Note: Portions of this report may have been transcribed using voice recognition software. Every effort was made to ensure accuracy; however, inadvertent computerized transcription errors may be present.   Any transcriptional errors that result from this process are unintentional.     [name], MD, FACS, MASCRS Gastrointestinal and Minimally Invasive Surgery  [address] [place], [place] [address] [phone] Main / Paging [phone] Fax

## 2019-07-11 MED ORDER — KCL IN DEXTROSE-NACL 40-5-0.45 MEQ/L-%-% IV SOLN
INTRAVENOUS | Status: DC
  Administered 2019-07-11 – 2019-07-14 (×4): via INTRAVENOUS
  Filled 2019-07-11 (×6): qty 1000

## 2019-07-11 MED ORDER — METOCLOPRAMIDE HCL 5 MG/ML IJ SOLN
5.0000 mg | Freq: Three times a day (TID) | INTRAMUSCULAR | Status: DC | PRN
  Administered 2019-07-11 – 2019-07-13 (×3): 10 mg via INTRAVENOUS
  Filled 2019-07-11 (×3): qty 2

## 2019-07-11 MED ORDER — MAGIC MOUTHWASH
15.0000 mL | Freq: Four times a day (QID) | ORAL | Status: AC
  Administered 2019-07-11 – 2019-07-13 (×7): 15 mL via ORAL
  Filled 2019-07-11 (×12): qty 15

## 2019-07-11 NOTE — Progress Notes (Signed)
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 Active Problems:   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HCC)   History of postoperative nausea   Cecal volvulus (HCC)   IBS (irritable bowel syndrome)   Headache   GERD (gastroesophageal reflux disease)   8 Days Post-Op  07/03/2019  POST-OPERATIVE DIAGNOSIS:  PROXIMAL COLON BASCULE WITH INTERMITTENT RIGHT COLON VOLVULUS  PROCEDURE:   ROBOTIC PROXIMAL COLECTOMY OMENTOPEXY ASSESSMENT OF PERFUSION WITH FIREFLY IMMUNOFLUORESCENCE BILATERAL TAP BLOCK  SURGEON:  [name], MD    Assessment  Ileus w chronic nausea   [place] Stay = 8 days)  Plan:  Feeling better after NG tube placed.  X-rays consistent with early bowel obstruction versus ileus.  Not toxic with pain, so hold off on CT scan.  Should improve.  Rehydrate.  Check electrolytes and correct as needed.    Acute postoperative anemia on top of some chronic anemia.  Low iron.  Iron deficiency.  IV iron given given poor p.o. tolerance and IBS with oral iron.  PRN nausea medicines for now since nasogastric tube in place.  CT scan if worse.  H/o Headache.  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  She is not thrombocytopenic.  Return to ibuprofen as needed.  Follow platelets  GERD - PPI BID  VTE prophylaxis- SCDs, etc   Mobilize as tolerated to help recovery.  GET HER UP!!      30 minutes spent in review, evaluation, examination, counseling, and coordination of care.  More than 50% of that time was spent in counseling.  07/11/2019    Subjective: (Chief complaint)  Had more severe nausea and vomiting.  NG tube placed.  Feels better in her abdomen now.  Nausea gone.  No abdominal pain.  Nasogastric tube sore and sinuses and throat.  Still passing gas and having some bowel movements  Objective:  Vital signs:  Vitals:   07/10/19 0515 07/10/19 1318 07/10/19 2111 07/11/19 0517  BP: 119/78 132/71 133/76 116/71  Pulse: 89 83 86 92  Resp: 16 17 20 16   Temp: 99 F (37.2 C) 99.6 F (37.6 C) 98.5 F (36.9 C) 98.5 F (36.9 C)  TempSrc: Oral Oral Oral Oral  SpO2: 95% 96% 94% 97%  Weight:      Height:        Last BM Date: 07/10/19  Intake/Output   Yesterday:  07/28 0701 - 07/29 0700 In: 1985.8 [I.V.:1500; NG/GT:100; IV Piggyback:385.8] Out: 2552 [Urine:900; Emesis/NG output:1651; Stool:1] This shift:  Total I/O In: 700 [I.V.:600; NG/GT:100] Out: 1451 [Urine:800; Emesis/NG output:650; Stool:1]  Bowel function:  Flatus: YES  BM:  YES  Drain: Bilious - NGT   Physical Exam:  General: Pt awake/alert/oriented x4 in no acute distress.  Tired.  Not toxic.   Eyes: PERRL, normal EOM.  Sclera clear.  No icterus Neuro: CN II-XII intact w/o focal sensory/motor deficits. Lymph: No head/neck/groin lymphadenopathy Psych:  No delerium/psychosis/paranoia HENT: Normocephalic, Mucus  membranes moist.  No thrush Neck: Supple, No tracheal deviation Chest: No chest wall pain w good excursion CV:  Pulses intact.  Regular rhythm MS: Normal AROM mjr joints.  No obvious deformity  Abdomen: Soft.  Nondistended.  Nontender.  Moderate ecchymosis along left upper flank and anterior abdominal wall-stable and change in color/resolving.  No evidence of peritonitis.  No incarcerated hernias.  Incisions dry.   Ext:  No deformity.  No mjr edema.  No cyanosis Skin: No petechiae / purpura  Results:   Cultures: Recent Results (from the past 720 hour(s))  SARS Coronavirus 2 (Performed in [facility] hospital lab)     Status: None   Collection Time: 06/29/19 10:42 AM   Specimen: Nasal Swab  Result Value Ref Range Status   SARS Coronavirus 2 NEGATIVE NEGATIVE Final    Comment: (NOTE) SARS-CoV-2 target nucleic acids are NOT DETECTED. The SARS-CoV-2 RNA is generally detectable in upper and lower respiratory specimens during the acute phase of infection. Negative results do not preclude SARS-CoV-2 infection, do not rule out co-infections with other pathogens, and should not be used as the sole basis for treatment or other patient management decisions. Negative results must be combined with clinical observations, patient history, and epidemiological information. The expected result is Negative. Fact Sheet for Patients: SugarRoll.be Fact Sheet for Healthcare Providers: https://www.woods-mathews.com/ This test is not yet approved or cleared by the [place] FDA and  has been authorized for detection and/or diagnosis of SARS-CoV-2 by FDA under an Emergency Use Authorization (EUA). This EUA will remain  in effect (meaning this test can be used) for the duration of the COVID-19 declaration under Section 56 4(b)(1) of the Act, 21 U.S.C. section 360bbb-3(b)(1), unless the authorization is terminated or revoked sooner. Performed at [facility], [place] [address], [place], [place] [address]     Labs: Results for orders placed or performed during the hospital encounter of 07/03/19 (from the past 48 hour(s))  Comprehensive metabolic panel     Status: Abnormal   Collection Time: 07/09/19  8:09 AM  Result Value Ref Range   Sodium 137 135 - 145 mmol/L   Potassium 4.2 3.5 - 5.1 mmol/L   Chloride 100 98 - 111 mmol/L   CO2 28 22 - 32 mmol/L   Glucose,  Bld 106 (H) 70 - 99 mg/dL   BUN 12 6 - 20 mg/dL   Creatinine, Ser 0.75 0.44 - 1.00 mg/dL   Calcium 8.9 8.9 - 10.3 mg/dL   Total Protein 6.2 (L) 6.5 - 8.1 g/dL   Albumin 3.0 (L) 3.5 - 5.0 g/dL   AST 39 15 - 41 U/L   ALT 57 (H) 0 - 44 U/L   Alkaline Phosphatase 49 38 - 126 U/L   Total Bilirubin 1.1 0.3 - 1.2 mg/dL   GFR calc non Af Amer >60 >60 mL/min   GFR calc Af Amer >60 >60 mL/min   Anion gap 9 5 - 15    Comment: Performed at [facility], [place] [address], [place], [place] [address]  Lipase, blood     Status: None   Collection Time: 07/09/19  8:09 AM  Result Value Ref Range   Lipase 19 11 - 51 U/L    Comment: Performed at [facility], [place] [address] [place], [place] [address]  Vitamin B12     Status: None   Collection Time: 07/09/19  8:09 AM  Result Value Ref Range   Vitamin B-12 320 180 -  914 pg/mL    Comment: (NOTE) This assay is not validated for testing neonatal or myeloproliferative syndrome specimens for Vitamin B12 levels. Performed at [facility], [place] [address] [place], [place] [address]   Folate     Status: None   Collection Time: 07/09/19  8:09 AM  Result Value Ref Range   Folate 9.5 >5.9 ng/mL    Comment: Performed at [facility], [place] [address], [place], [place] [address]  Iron and TIBC     Status: Abnormal   Collection Time: 07/09/19  8:09 AM  Result Value Ref Range   Iron 23 (L) 28 - 170 ug/dL   TIBC 232 (L) 250 - 450 ug/dL   Saturation Ratios 10 (L) 10.4 - 31.8 %   UIBC 209 ug/dL    Comment: Performed at [facility], [place] [address] [place], [place] [address]  Ferritin     Status: None   Collection Time: 07/09/19  8:09 AM  Result Value Ref Range   Ferritin 90 11 - 307 ng/mL    Comment: Performed at [facility] [place] [address], [place], [place] [address]  Reticulocytes     Status: Abnormal   Collection Time: 07/09/19  8:09 AM  Result  Value Ref Range   Retic Ct Pct 4.1 (H) 0.4 - 3.1 %   RBC. 2.87 (L) 3.87 - 5.11 MIL/uL   Retic Count, Absolute 117.4 19.0 - 186.0 K/uL   Immature Retic Fract 28.8 (H) 2.3 - 15.9 %    Comment: Performed at [facility], [place] [address] [place], [place] [address]  Prealbumin     Status: Abnormal   Collection Time: 07/10/19 12:52 AM  Result Value Ref Range   Prealbumin 15.6 (L) 18 - 38 mg/dL    Comment: Performed at [facility], [place] [address] [place], [place] [address]  Creatinine, serum     Status: None   Collection Time: 07/10/19  6:06 AM  Result Value Ref Range   Creatinine, Ser 0.61 0.44 - 1.00 mg/dL   GFR calc non Af Amer >60 >60 mL/min   GFR calc Af Amer >60 >60 mL/min    Comment: Performed at [facility], [place] [address] [place], [place] [address]  CBC     Status: Abnormal   Collection Time: 07/10/19  6:06 AM  Result Value Ref Range   WBC 7.2 4.0 - 10.5 K/uL   RBC 2.86 (L) 3.87 - 5.11 MIL/uL   Hemoglobin 9.3 (L) 12.0 - 15.0 g/dL   HCT 27.8 (L) 36.0 - 46.0 %   MCV 97.2 80.0 - 100.0 fL   MCH 32.5 26.0 - 34.0 pg   MCHC 33.5 30.0 - 36.0 g/dL   RDW 13.1 11.5 - 15.5 %   Platelets 185 150 - 400 K/uL   nRBC 0.0 0.0 - 0.2 %    Comment: Performed at [facility], [place] [address], [place], [place] [address]  Magnesium     Status: None   Collection Time: 07/10/19  6:06 AM  Result Value Ref Range   Magnesium 1.9 1.7 - 2.4 mg/dL    Comment: Performed at [facility], [place] [address], [place], [place] [address]  Potassium     Status: None   Collection Time: 07/10/19  6:06 AM  Result Value Ref Range   Potassium 4.0 3.5 - 5.1 mmol/L    Comment: Performed at [facility], [address] [place], [place] [address]    Imaging / Studies: Dg Abd Acute 2+v W 1v Chest  Result Date: 07/10/2019 CLINICAL DATA:  Nausea vomiting, status post colectomy EXAM: DG ABDOMEN ACUTE W/ 1V  CHEST COMPARISON:  July 08, 2019 FINDINGS: Mildly dilated air-filled loops of small bowel are seen within the mid abdomen measuring up to 4.4 cm. There is air fluid levels. There is a small amount of air seen in the descending colon. No pneumoperitoneum is noted. Surgical clips in the right upper quadrant. The lungs are clear. The cardiomediastinal silhouette is unremarkable. No acute osseous abnormality. IMPRESSION: Partial small bowel obstruction. Electronically Signed   By: Prudencio Pair M.D.   On: 07/10/2019 17:15   Dg Abd Portable 1v  Result Date: 07/10/2019 CLINICAL DATA:  NG tube placement EXAM: PORTABLE ABDOMEN - 1 VIEW COMPARISON:  Radiograph same day 2:53 p.m. FINDINGS: There is been interval placement NG tube with the tip projecting over the duodenum. The side hole seen projecting over the mid body of the stomach. Again noted are air-filled mildly dilated loops of small. IMPRESSION: NG tube side hole projecting over the mid body. Electronically Signed   By: Prudencio Pair M.D.   On: 07/10/2019 17:20    Medications / Allergies: per chart  Antibiotics: Anti-infectives (From admission, onward)   Start     Dose/Rate Route Frequency Ordered Stop   07/04/19 0000  cefoTEtan (CEFOTAN) 2 g in sodium chloride 0.9 % 100 mL IVPB     2 g 200 mL/hr over 30 Minutes Intravenous Every 12 hours 07/03/19 1658 07/04/19 0115   07/03/19 1531  clindamycin (CLEOCIN) 900 mg, gentamicin (GARAMYCIN) 240 mg in sodium chloride 0.9 % 1,000 mL for intraperitoneal lavage  Status:  Discontinued       As needed 07/03/19 1532 07/03/19 1543   07/03/19 1400  neomycin (MYCIFRADIN) tablet 1,000 mg  Status:  Discontinued     1,000 mg Oral 3 times per day 07/03/19 1127 07/03/19 1136   07/03/19 1400  metroNIDAZOLE (FLAGYL) tablet 1,000 mg  Status:  Discontinued     1,000 mg Oral 3 times per day 07/03/19 1127 07/03/19 1136   07/03/19 1130  cefoTEtan (CEFOTAN) 2 g in sodium chloride 0.9 % 100 mL IVPB     2 g 200 mL/hr over 30  Minutes Intravenous On call to O.R. 07/03/19 1127 07/03/19 1301   07/03/19 0600  clindamycin (CLEOCIN) 900 mg, gentamicin (GARAMYCIN) 240 mg in sodium chloride 0.9 % 1,000 mL for intraperitoneal lavage  Status:  Discontinued      Irrigation To Surgery 07/02/19 0749 07/03/19 1658        Note: Portions of this report may have been transcribed using voice recognition software. Every effort was made to ensure accuracy; however, inadvertent computerized transcription errors may be present.   Any transcriptional errors that result from this process are unintentional.     [name], MD, FACS, MASCRS Gastrointestinal and Minimally Invasive Surgery    [address], [place], [place] [address] [phone] Main / Paging [phone] Fax

## 2019-07-12 LAB — BASIC METABOLIC PANEL
Anion gap: 8 (ref 5–15)
BUN: 7 mg/dL (ref 6–20)
CO2: 20 mmol/L — ABNORMAL LOW (ref 22–32)
Calcium: 8.4 mg/dL — ABNORMAL LOW (ref 8.9–10.3)
Chloride: 107 mmol/L (ref 98–111)
Creatinine, Ser: 0.59 mg/dL (ref 0.44–1.00)
GFR calc Af Amer: >60 mL/min (ref >60)
GFR calc non Af Amer: >60 mL/min (ref >60)
Glucose, Bld: 105 mg/dL — ABNORMAL HIGH (ref 70–99)
Potassium: 4.5 mmol/L (ref 3.5–5.1)
Sodium: 135 mmol/L (ref 135–145)

## 2019-07-12 LAB — CBC
HCT: 31.4 % — ABNORMAL LOW (ref 36.0–46.0)
Hemoglobin: 10.2 g/dL — ABNORMAL LOW (ref 12.0–15.0)
MCH: 31.8 pg (ref 26.0–34.0)
MCHC: 32.5 g/dL (ref 30.0–36.0)
MCV: 97.8 fL (ref 80.0–100.0)
Platelets: 291 10*3/uL (ref 150–400)
RBC: 3.21 MIL/uL — ABNORMAL LOW (ref 3.87–5.11)
RDW: 13.1 % (ref 11.5–15.5)
WBC: 7.6 10*3/uL (ref 4.0–10.5)
nRBC: 0 % (ref 0.0–0.2)

## 2019-07-12 LAB — MAGNESIUM: Magnesium: 2 mg/dL (ref 1.7–2.4)

## 2019-07-12 MED ORDER — LORAZEPAM 2 MG/ML IJ SOLN
1.0000 mg | Freq: Every evening | INTRAMUSCULAR | Status: DC | PRN
  Administered 2019-07-12 – 2019-07-15 (×2): 1 mg via INTRAVENOUS
  Filled 2019-07-12 (×2): qty 1

## 2019-07-12 MED ORDER — FENTANYL CITRATE (PF) 100 MCG/2ML IJ SOLN
25.0000 ug | INTRAMUSCULAR | Status: DC | PRN
  Administered 2019-07-13: 50 ug via INTRAVENOUS
  Administered 2019-07-15: 25 ug via INTRAVENOUS
  Filled 2019-07-12 (×2): qty 2

## 2019-07-13 MED ORDER — CALCIUM CARBONATE-VITAMIN D 500-200 MG-UNIT PO TABS
2.0000 | ORAL_TABLET | Freq: Every day | ORAL | Status: DC
  Administered 2019-07-14 – 2019-07-15 (×2): 2 via ORAL
  Filled 2019-07-13 (×2): qty 2

## 2019-07-13 MED ORDER — ESTRADIOL 1 MG PO TABS
1.0000 mg | ORAL_TABLET | Freq: Every day | ORAL | Status: DC
  Administered 2019-07-14 – 2019-07-15 (×2): 1 mg via ORAL
  Filled 2019-07-13 (×3): qty 1

## 2019-07-13 MED ORDER — UNJURY CHICKEN SOUP POWDER
8.0000 [oz_av] | Freq: Two times a day (BID) | ORAL | Status: DC
  Administered 2019-07-14 – 2019-07-15 (×3): 8 [oz_av] via ORAL

## 2019-07-13 MED ORDER — PROGESTERONE MICRONIZED 100 MG PO CAPS
200.0000 mg | ORAL_CAPSULE | Freq: Every day | ORAL | Status: DC
  Administered 2019-07-14 – 2019-07-15 (×2): 200 mg via ORAL
  Filled 2019-07-13 (×3): qty 2

## 2019-07-13 MED ORDER — PANTOPRAZOLE SODIUM 40 MG PO TBEC
40.0000 mg | DELAYED_RELEASE_TABLET | Freq: Every day | ORAL | Status: DC
  Administered 2019-07-14 – 2019-07-15 (×2): 40 mg via ORAL
  Filled 2019-07-13 (×2): qty 1

## 2019-07-13 NOTE — Progress Notes (Signed)
Pt. Had NG tube clamped at 0800, NG pulled back approximately 10 cm. Pt states more comfort after being pulled back.  She has tolerated well with intermittent nausea. Pt having new onset of gagging feeling at back of tongue unrelated to NG tube, she feels it is hard to swallow and sometimes catch her breath. This RN examined oral cavity, NG Tube in correct placement however pt. uvula is inflamed. This may be the source of her discomfort today. MD paged.

## 2019-07-13 NOTE — Progress Notes (Addendum)
Pt. Ambulated multiple times today. NG tube has been clamped since 0745 am and pulled back ~10 cm. RN has been treating nausea aggressively with meds. She has drank a total of 180 ml of fluids. She did not receive any oral medications due to difficulty swallow with NG tube and Uvula swollen. Encouraged patient to take small sips. Patient also having new bruising on bilateral hips, slowly increasing in diameter. Pt. Has hx of ITP and on Lovenox. Areas are marked on patient. Lovenox held today per patient. Rapid RN called in regards to swollen uvula. No immediate concern. Patient can protect airway but unable to reach MD after calling office. No interventions needed and pictures taken on patient phone.

## 2019-07-13 NOTE — Discharge Instructions (Signed)
SURGERY: POST OP INSTRUCTIONS °(Surgery for small bowel obstruction, colon resection, etc) ° ° °###################################################################### ° °EAT °Gradually transition to a high fiber diet with a fiber supplement over the next few days after discharge ° °WALK °Walk an hour a day.  Control your pain to do that.   ° °CONTROL PAIN °Control pain so that you can walk, sleep, tolerate sneezing/coughing, go up/down stairs. ° °HAVE A BOWEL MOVEMENT DAILY °Keep your bowels regular to avoid problems.  OK to try a laxative to override constipation.  OK to use an antidairrheal to slow down diarrhea.  Call if not better after 2 tries ° °CALL IF YOU HAVE PROBLEMS/CONCERNS °Call if you are still struggling despite following these instructions. °Call if you have concerns not answered by these instructions ° °###################################################################### ° ° °DIET °Follow a light diet the first few days at home.  Start with a bland diet such as soups, liquids, starchy foods, low fat foods, etc.  If you feel full, bloated, or constipated, stay on a ful liquid or pureed/blenderized diet for a few days until you feel better and no longer constipated. °Be sure to drink plenty of fluids every day to avoid getting dehydrated (feeling dizzy, not urinating, etc.). °Gradually add a fiber supplement to your diet over the next week.  Gradually get back to a regular solid diet.  Avoid fast food or heavy meals the first week as you are more likely to get nauseated. °It is expected for your digestive tract to need a few months to get back to normal.  It is common for your bowel movements and stools to be irregular.  You will have occasional bloating and cramping that should eventually fade away.  Until you are eating solid food normally, off all pain medications, and back to regular activities; your bowels will not be normal. °Focus on eating a low-fat, high fiber diet the rest of your life  (See Getting to Good Bowel Health, below). ° °CARE of your INCISION or WOUND °It is good for closed incision and even open wounds to be washed every day.  Shower every day.  Short baths are fine.  Wash the incisions and wounds clean with soap & water.    °If you have a closed incision(s), wash the incision with soap & water every day.  You may leave closed incisions open to air if it is dry.   You may cover the incision with clean gauze & replace it after your daily shower for comfort. °If you have skin tapes (Steristrips) or skin glue (Dermabond) on your incision, leave them in place.  They will fall off on their own like a scab.  You may trim any edges that curl up with clean scissors.  If you have staples, set up an appointment for them to be removed in the office in 10 days after surgery.  °If you have a drain, wash around the skin exit site with soap & water and place a new dressing of gauze or band aid around the skin every day.  Keep the drain site clean & dry.    °If you have an open wound with packing, see wound care instructions.  In general, it is encouraged that you remove your dressing and packing, shower with soap & water, and replace your dressing once a day.  Pack the wound with clean gauze moistened with normal (0.9%) saline to keep the wound moist & uninfected.  Pressure on the dressing for 30 minutes will stop most wound   bleeding.  Eventually your body will heal & pull the open wound closed over the next few months.  °Raw open wounds will occasionally bleed or secrete yellow drainage until it heals closed.  Drain sites will drain a little until the drain is removed.  Even closed incisions can have mild bleeding or drainage the first few days until the skin edges scab over & seal.   °If you have an open wound with a wound vac, see wound vac care instructions. ° ° ° ° °ACTIVITIES as tolerated °Start light daily activities --- self-care, walking, climbing stairs-- beginning the day after surgery.   Gradually increase activities as tolerated.  Control your pain to be active.  Stop when you are tired.  Ideally, walk several times a day, eventually an hour a day.   °Most people are back to most day-to-day activities in a few weeks.  It takes 4-8 weeks to get back to unrestricted, intense activity. °If you can walk 30 minutes without difficulty, it is safe to try more intense activity such as jogging, treadmill, bicycling, low-impact aerobics, swimming, etc. °Save the most intensive and strenuous activity for last (Usually 4-8 weeks after surgery) such as sit-ups, heavy lifting, contact sports, etc.  Refrain from any intense heavy lifting or straining until you are off narcotics for pain control.  You will have off days, but things should improve week-by-week. °DO NOT PUSH THROUGH PAIN.  Let pain be your guide: If it hurts to do something, don't do it.  Pain is your body warning you to avoid that activity for another week until the pain goes down. °You may drive when you are no longer taking narcotic prescription pain medication, you can comfortably wear a seatbelt, and you can safely make sudden turns/stops to protect yourself without hesitating due to pain. °You may have sexual intercourse when it is comfortable. If it hurts to do something, stop. ° °MEDICATIONS °Take your usually prescribed home medications unless otherwise directed.   °Blood thinners:  °Usually you can restart any strong blood thinners after the second postoperative day.  It is OK to take aspirin right away.    ° If you are on strong blood thinners (warfarin/Coumadin, Plavix, Xerelto, Eliquis, Pradaxa, etc), discuss with your surgeon, medicine PCP, and/or cardiologist for instructions on when to restart the blood thinner & if blood monitoring is needed (PT/INR blood check, etc).   ° ° °PAIN CONTROL °Pain after surgery or related to activity is often due to strain/injury to muscle, tendon, nerves and/or incisions.  This pain is usually  short-term and will improve in a few months.  °To help speed the process of healing and to get back to regular activity more quickly, DO THE FOLLOWING THINGS TOGETHER: °1. Increase activity gradually.  DO NOT PUSH THROUGH PAIN °2. Use Ice and/or Heat °3. Try Gentle Massage and/or Stretching °4. Take over the counter pain medication °5. Take Narcotic prescription pain medication for more severe pain ° °Good pain control = faster recovery.  It is better to take more medicine to be more active than to stay in bed all day to avoid medications. °1.  Increase activity gradually °Avoid heavy lifting at first, then increase to lifting as tolerated over the next 6 weeks. °Do not “push through” the pain.  Listen to your body and avoid positions and maneuvers than reproduce the pain.  Wait a few days before trying something more intense °Walking an hour a day is encouraged to help your body recover faster   and more safely.  Start slowly and stop when getting sore.  If you can walk 30 minutes without stopping or pain, you can try more intense activity (running, jogging, aerobics, cycling, swimming, treadmill, sex, sports, weightlifting, etc.) °Remember: If it hurts to do it, then don’t do it! °2. Use Ice and/or Heat °You will have swelling and bruising around the incisions.  This will take several weeks to resolve. °Ice packs or heating pads (6-8 times a day, 30-60 minutes at a time) will help sooth soreness & bruising. °Some people prefer to use ice alone, heat alone, or alternate between ice & heat.  Experiment and see what works best for you.  Consider trying ice for the first few days to help decrease swelling and bruising; then, switch to heat to help relax sore spots and speed recovery. °Shower every day.  Short baths are fine.  It feels good!  Keep the incisions and wounds clean with soap & water.   °3. Try Gentle Massage and/or Stretching °Massage at the area of pain many times a day °Stop if you feel pain - do not  overdo it °4. Take over the counter pain medication °This helps the muscle and nerve tissues become less irritable and calm down faster °Choose ONE of the following over-the-counter anti-inflammatory medications: °Acetaminophen 500mg tabs (Tylenol) 1-2 pills with every meal and just before bedtime (avoid if you have liver problems or if you have acetaminophen in you narcotic prescription) °Naproxen 220mg tabs (ex. Aleve, Naprosyn) 1-2 pills twice a day (avoid if you have kidney, stomach, IBD, or bleeding problems) °Ibuprofen 200mg tabs (ex. Advil, Motrin) 3-4 pills with every meal and just before bedtime (avoid if you have kidney, stomach, IBD, or bleeding problems) °Take with food/snack several times a day as directed for at least 2 weeks to help keep pain / soreness down & more manageable. °5. Take Narcotic prescription pain medication for more severe pain °A prescription for strong pain control is often given to you upon discharge (for example: oxycodone/Percocet, hydrocodone/Norco/Vicodin, or tramadol/Ultram) °Take your pain medication as prescribed. °Be mindful that most narcotic prescriptions contain Tylenol (acetaminophen) as well - avoid taking too much Tylenol. °If you are having problems/concerns with the prescription medicine (does not control pain, nausea, vomiting, rash, itching, etc.), please call us [phone] to see if we need to switch you to a different pain medicine that will work better for you and/or control your side effects better. °If you need a refill on your pain medication, you must call the office before 4 pm and on weekdays only.  By federal law, prescriptions for narcotics cannot be called into a pharmacy.  They must be filled out on paper & picked up from our office by the patient or authorized caretaker.  Prescriptions cannot be filled after 4 pm nor on weekends.   ° °WHEN TO CALL US [phone] °Severe uncontrolled or worsening pain  °Fever over 101 F (38.5 C) °Concerns with  the incision: Worsening pain, redness, rash/hives, swelling, bleeding, or drainage °Reactions / problems with new medications (itching, rash, hives, nausea, etc.) °Nausea and/or vomiting °Difficulty urinating °Difficulty breathing °Worsening fatigue, dizziness, lightheadedness, blurred vision °Other concerns °If you are not getting better after two weeks or are noticing you are getting worse, contact our office [phone] for further advice.  We may need to adjust your medications, re-evaluate you in the office, send you to the emergency room, or see what other things we can do to help. °The   clinic staff is available to answer your questions during regular business hours (8:30am-5pm).  Please don’t hesitate to call and ask to speak to one of our nurses for clinical concerns.    °A surgeon from [facility] is always on call at the hospitals 24 hours/day °If you have a medical emergency, go to the nearest emergency room or call 911. ° °FOLLOW UP in our office °One the day of your discharge from the hospital (or the next business weekday), please call [facility] to set up or confirm an appointment to see your surgeon in the office for a follow-up appointment.  Usually it is 2-3 weeks after your surgery.   °If you have skin staples at your incision(s), let the office know so we can set up a time in the office for the nurse to remove them (usually around 10 days after surgery). °Make sure that you call for appointments the day of discharge (or the next business weekday) from the hospital to ensure a convenient appointment time. °IF YOU HAVE DISABILITY OR FAMILY LEAVE FORMS, BRING THEM TO THE OFFICE FOR PROCESSING.  DO NOT GIVE THEM TO YOUR DOCTOR. ° °[facility] °[address], [address], [place],   [address] ? °[phone] - Main °[phone] - Toll Free,  [phone] - Fax °[email or web address] ° °GETTING TO GOOD BOWEL HEALTH. °It is  expected for your digestive tract to need a few months to get back to normal.  It is common for your bowel movements and stools to be irregular.  You will have occasional bloating and cramping that should eventually fade away.  Until you are eating solid food normally, off all pain medications, and back to regular activities; your bowels will not be normal.   °Avoiding constipation °The goal: ONE SOFT BOWEL MOVEMENT A DAY!    °Drink plenty of fluids.  Choose water first. °TAKE A FIBER SUPPLEMENT EVERY DAY THE REST OF YOUR LIFE °During your first week back home, gradually add back a fiber supplement every day °Experiment which form you can tolerate.   There are many forms such as powders, tablets, wafers, gummies, etc °Psyllium bran (Metamucil), methylcellulose (Citrucel), Miralax or Glycolax, Benefiber, Flax Seed.  °Adjust the dose week-by-week (1/2 dose/day to 6 doses a day) until you are moving your bowels 1-2 times a day.  Cut back the dose or try a different fiber product if it is giving you problems such as diarrhea or bloating. °Sometimes a laxative is needed to help jump-start bowels if constipated until the fiber supplement can help regulate your bowels.  If you are tolerating eating & you are farting, it is okay to try a gentle laxative such as double dose MiraLax, prune juice, or Milk of Magnesia.  Avoid using laxatives too often. °Stool softeners can sometimes help counteract the constipating effects of narcotic pain medicines.  It can also cause diarrhea, so avoid using for too long. °If you are still constipated despite taking fiber daily, eating solids, and a few doses of laxatives, call our office. °Controlling diarrhea °Try drinking liquids and eating bland foods for a few days to avoid stressing your intestines further. °Avoid dairy products (especially milk & ice cream) for a short time.  The intestines often can lose the ability to digest lactose when stressed. °Avoid foods that cause gassiness or  bloating.  Typical foods include beans and other legumes, cabbage, broccoli, and dairy foods.  Avoid greasy, spicy, fast foods.  Every person has   some sensitivity to other foods, so listen to your body and avoid those foods that trigger problems for you. Probiotics (such as active yogurt, Align, etc) may help repopulate the intestines and colon with normal bacteria and calm down a sensitive digestive tract Adding a fiber supplement gradually can help thicken stools by absorbing excess fluid and retrain the intestines to act more normally.  Slowly increase the dose over a few weeks.  Too much fiber too soon can backfire and cause cramping & bloating. It is okay to try and slow down diarrhea with a few doses of antidiarrheal medicines.   Bismuth subsalicylate (ex. Kayopectate, Pepto Bismol) for a few doses can help control diarrhea.  Avoid if pregnant.   Loperamide (Imodium) can slow down diarrhea.  Start with one tablet (2mg ) first.  Avoid if you are having fevers or severe pain.  ILEOSTOMY PATIENTS WILL HAVE CHRONIC DIARRHEA since their colon is not in use.    Drink plenty of liquids.  You will need to drink even more glasses of water/liquid a day to avoid getting dehydrated. Record output from your ileostomy.  Expect to empty the bag every 3-4 hours at first.  Most people with a permanent ileostomy empty their bag 4-6 times at the least.   Use antidiarrheal medicine (especially Imodium) several times a day to avoid getting dehydrated.  Start with a dose at bedtime & breakfast.  Adjust up or down as needed.  Increase antidiarrheal medications as directed to avoid emptying the bag more than 8 times a day (every 3 hours). Work with your wound ostomy nurse to learn care for your ostomy.  See ostomy care instructions. TROUBLESHOOTING IRREGULAR BOWELS 1) Start with a soft & bland diet. No spicy, greasy, or fried foods.  2) Avoid gluten/wheat or dairy products from diet to see if symptoms improve. 3) Miralax  17gm or flax seed mixed in [place]. water or juice-daily. May use 2-4 times a day as needed. 4) Gas-X, Phazyme, etc. as needed for gas & bloating.  5) Prilosec (omeprazole) over-the-counter as needed 6)  Consider probiotics (Align, Activa, etc) to help calm the bowels down  Call your doctor if you are getting worse or not getting better.  Sometimes further testing (cultures, endoscopy, X-ray studies, CT scans, bloodwork, etc.) may be needed to help diagnose and treat the cause of the diarrhea. [facility], [place], [place], [place], [place]  [address] [phone] - Main.    [phone]  - Toll Free.   [phone] - Fax [email or web address]  Diarrhea Nutrition Tips   If you have diarrhea:  Drink plenty of room-temperature liquids to help prevent dehydration. These may include broths/bouillon,  fruit juices (diluted, if necessary), dilute Gatorade, Pedialyte, Drip Drop Hydration, ginger ale, dilute fruit  nectars, coconut water, water, or weak teas. Limit caffeinated and/or carbonated beverages, if gas or  cramps occur.  Carbonated beverages are sometimes better tolerated if you allow them to lose their fizz or stir them  before you drink them.  Consume more potassium-rich foods such as coconut water, bananas, peeled potatoes, lactose-free milk  or yogurt.  Drink or eat some high-sodium foods, such as broth, soups, crackers, pretzels, to help replace sodium  losses.  Try the modified White Diet - bananas, rice, applesauce, white toast, noodles, chicken breast, white fish,  eggs, soft tofu, cottage cheese, smooth yogurt, etc.  Choose smaller, but more frequent meals. Try to eat something every 3 to 4 hours.  Avoid greasy,  spicy, highly seasoned and very sweet/sugary foods.  Avoid raw vegetables and the skins, seeds, and stringy fibers of unpeeled fruits. Gradually resume your  consumption of higher fiber foods, fruits and  vegetables once the diarrhea subsides.  Use dairy products with care due to possible lactose intolerance. Try lactose-free milk, soy milk, or yogurt,  which have low or no lactose.  Limit use of sugar-free gums and candies made with sorbitol - these can cause gas/bloating/diarrhea.  From [facility]

## 2019-07-13 NOTE — Telephone Encounter (Signed)
Dr. Silverio Decamp, pt reported that she no longer needs second opinion but would like to transfer care over to you.  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 Johney Maine (in Epic for review.)  Will you accept this patient?

## 2019-07-13 NOTE — Telephone Encounter (Signed)
ok 

## 2019-07-13 NOTE — Progress Notes (Signed)
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 Active Problems:   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HCC)   History of postoperative nausea   Cecal volvulus (HCC)   IBS (irritable bowel syndrome)   Headache   GERD (gastroesophageal reflux disease)   10 Days Post-Op  07/03/2019  POST-OPERATIVE DIAGNOSIS:  PROXIMAL COLON BASCULE WITH INTERMITTENT RIGHT COLON VOLVULUS  PROCEDURE:   ROBOTIC PROXIMAL COLECTOMY OMENTOPEXY ASSESSMENT OF PERFUSION WITH FIREFLY IMMUNOFLUORESCENCE BILATERAL TAP BLOCK  SURGEON:  [name], MD    Assessment  Ileus w chronic nausea - stabilizing  [facility] Stay = 10 days)  Plan:  Feeling better after NG tube placed.  X-rays consistent with early bowel obstruction versus ileus.  Not toxic with pain, so hold off on CT scan.  Should improve.  Rehydrate.   Check electrolytes and correct as needed.    Meds for insomnis.  Increase diurnal activity  Acute postoperative anemia on top of some chronic anemia.  Low iron.  Iron deficiency.  IV iron given given poor p.o. tolerance and IBS with oral iron.  PRN nausea medicines for now since nasogastric tube in place.  CT scan if worse.  H/o Headache.  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  She is not thrombocytopenic.  Return to ibuprofen as needed.  Follow platelets  GERD - PPI BID  VTE prophylaxis- SCDs, etc  Mobilize as  tolerated to help recovery.  GET HER UP!!      30 minutes spent in review, evaluation, examination, counseling, and coordination of care.  More than 50% of that time was spent in counseling.  I updated the patient's status to the patient and family.  Recommendations were made.  Questions were answered.  They expressed understanding & appreciation.  07/13/2019    Subjective: (Chief complaint)  Denies abd pain  Talking w family on phone  Nausea much less No headache  C/o insomnia  Objective:  Vital signs:  Vitals:   07/12/19 0512 07/12/19 1036 07/12/19 1441 07/12/19 2055  BP: 129/69  114/66 130/75  Pulse: 97  78 87  Resp: 17  14 18   Temp: 98.6 F (37 C) 97.8 F (36.6 C) 97.6 F (36.4 C) 98 F (36.7 C)  TempSrc: Oral Oral  Oral  SpO2: 98%  99% 100%  Weight: 59.8 kg     Height:        Last BM Date: 07/11/19  Intake/Output   Yesterday:  07/30 0701 - 07/31 0700 In: 1054.3 [P.O.:60; I.V.:944.3; IV Piggyback:50] Out: 1950 [Urine:600; Emesis/NG output:1350] This shift:  Total I/O In: 654.3 [P.O.:60; I.V.:594.3] Out: 750 [Urine:200; Emesis/NG output:550]  Bowel function:  Flatus: YES  BM:  YES  Drain: Bilious - NGT   Physical Exam:  General: Pt awake/alert/oriented x4 in no acute distress.    Eyes: PERRL, normal EOM.  Sclera clear.  No icterus Neuro: CN II-XII intact w/o focal sensory/motor deficits. Lymph: No head/neck/groin lymphadenopathy Psych:  No delerium/psychosis/paranoia HENT: Normocephalic, Mucus membranes moist.  No thrush  Neck: Supple, No tracheal deviation Chest: No chest wall pain w good excursion CV:  Pulses intact.  Regular rhythm MS: Normal AROM mjr joints.  No obvious deformity  Abdomen: Soft.  Nondistended.  Nontender.  Moderate ecchymosis along left upper flank and anterior abdominal wall-stable and change in color/resolving.  No evidence of peritonitis.  No incarcerated hernias.  Incisions dry.  Ext:  No deformity.  No mjr edema.   No cyanosis Skin: No petechiae / purpura  Results:   Cultures: Recent Results (from the past 720 hour(s))  SARS Coronavirus 2 (Performed in [place] hospital lab)     Status: None   Collection Time: 06/29/19 10:42 AM   Specimen: Nasal Swab  Result Value Ref Range Status   SARS Coronavirus 2 NEGATIVE NEGATIVE Final    Comment: (NOTE) SARS-CoV-2 target nucleic acids are NOT DETECTED. The SARS-CoV-2 RNA is generally detectable in upper and lower respiratory specimens during the acute phase of infection. Negative results do not preclude SARS-CoV-2 infection, do not rule out co-infections with other pathogens, and should not be used as the sole basis for treatment or other patient management decisions. Negative results must be combined with clinical observations, patient history, and epidemiological information. The expected result is Negative. Fact Sheet for Patients: SugarRoll.be Fact Sheet for Healthcare Providers: https://www.woods-mathews.com/ This test is not yet approved or cleared by the [place] FDA and  has been authorized for detection and/or diagnosis of SARS-CoV-2 by FDA under an Emergency Use Authorization (EUA). This EUA will remain  in effect (meaning this test can be used) for the duration of the COVID-19 declaration under Section 56 4(b)(1) of the Act, 21 U.S.C. section 360bbb-3(b)(1), unless the authorization is terminated or revoked sooner. Performed at [facility], [place] [address] [place], [place] [address]     Labs: Results for orders placed or performed during the hospital encounter of 07/03/19 (from the past 48 hour(s))  Magnesium     Status: None   Collection Time: 07/12/19  3:33 AM  Result Value Ref Range   Magnesium 2.0 1.7 - 2.4 mg/dL    Comment: Performed at [facility], [place] [address] [place], [place] [address]  Basic metabolic panel     Status: Abnormal   Collection  Time: 07/12/19  3:33 AM  Result Value Ref Range   Sodium 135 135 - 145 mmol/L   Potassium 4.5 3.5 - 5.1 mmol/L   Chloride 107 98 - 111 mmol/L   CO2 20 (L) 22 - 32 mmol/L   Glucose, Bld 105 (H) 70 - 99 mg/dL   BUN 7 6 - 20 mg/dL   Creatinine, Ser 0.59 0.44 - 1.00 mg/dL   Calcium 8.4 (L) 8.9 - 10.3 mg/dL   GFR calc non Af Amer >60 >60 mL/min   GFR calc Af Amer >60 >60 mL/min   Anion gap 8 5 - 15    Comment: Performed at [facility], [place] [address], [place], [place] [address]  CBC     Status: Abnormal   Collection Time: 07/12/19  3:33 AM  Result Value Ref Range   WBC 7.6 4.0 - 10.5 K/uL   RBC 3.21 (L) 3.87 - 5.11 MIL/uL   Hemoglobin 10.2 (L) 12.0 - 15.0 g/dL   HCT 31.4 (L) 36.0 - 46.0 %   MCV 97.8 80.0 - 100.0 fL   MCH 31.8 26.0 - 34.0 pg   MCHC 32.5 30.0 - 36.0 g/dL   RDW 13.1 11.5 -  15.5 %   Platelets 291 150 - 400 K/uL   nRBC 0.0 0.0 - 0.2 %    Comment: Performed at [facility], [place] [address], [place], [place] [address]    Imaging / Studies: No results found.  Medications / Allergies: per chart  Antibiotics: Anti-infectives (From admission, onward)   Start     Dose/Rate Route Frequency Ordered Stop   07/04/19 0000  cefoTEtan (CEFOTAN) 2 g in sodium chloride 0.9 % 100 mL IVPB     2 g 200 mL/hr over 30 Minutes Intravenous Every 12 hours 07/03/19 1658 07/04/19 0115   07/03/19 1531  clindamycin (CLEOCIN) 900 mg, gentamicin (GARAMYCIN) 240 mg in sodium chloride 0.9 % 1,000 mL for intraperitoneal lavage  Status:  Discontinued       As needed 07/03/19 1532 07/03/19 1543   07/03/19 1400  neomycin (MYCIFRADIN) tablet 1,000 mg  Status:  Discontinued     1,000 mg Oral 3 times per day 07/03/19 1127 07/03/19 1136   07/03/19 1400  metroNIDAZOLE (FLAGYL) tablet 1,000 mg  Status:  Discontinued     1,000 mg Oral 3 times per day 07/03/19 1127 07/03/19 1136   07/03/19 1130  cefoTEtan (CEFOTAN) 2 g in sodium chloride 0.9 % 100 mL IVPB     2 g 200  mL/hr over 30 Minutes Intravenous On call to O.R. 07/03/19 1127 07/03/19 1301   07/03/19 0600  clindamycin (CLEOCIN) 900 mg, gentamicin (GARAMYCIN) 240 mg in sodium chloride 0.9 % 1,000 mL for intraperitoneal lavage  Status:  Discontinued      Irrigation To Surgery 07/02/19 0749 07/03/19 1658        Note: Portions of this report may have been transcribed using voice recognition software. Every effort was made to ensure accuracy; however, inadvertent computerized transcription errors may be present.   Any transcriptional errors that result from this process are unintentional.     [name], MD, FACS, MASCRS Gastrointestinal and Minimally Invasive Surgery    [address] [place],  [address] [phone] Main / Paging [phone] Fax

## 2019-07-13 NOTE — Progress Notes (Signed)
Initial Nutrition Assessment  INTERVENTION:   Provide Unjury chicken Soup BID, Each serving provides 100kcal and 21g protein   NUTRITION DIAGNOSIS:   Increased nutrient needs related to chronic illness, post-op healing as evidenced by estimated needs.  GOAL:   Patient will meet greater than or equal to 90% of their needs  MONITOR:   PO intake, Supplement acceptance, Labs, Weight trends, I & O's, Diet advancement  REASON FOR ASSESSMENT:   Consult Diet education  ASSESSMENT:   58 y.o. female patient admitted with cecal volvulus. S/p ROBOTIC PROXIMAL COLECTOMY  7/21: Admitted  **RD working remotely**  RD consulted to provide education for "IBS/chronic diarrhea diet". Unable to reach pt by phone. Left diet information in discharge instructions.   Per chart review, pt had nausea following surgery on 7/21. Pt has had diet changes throughout admission. Pt with post-op ileus. Pt was NPO starting 7/28 until today, now on clear liquids. NGT is clamped. Per surgery note, if pt unable to tolerate and have diet advanced pt may need to start TPN. Will monitor for plan.  Will order Unjury chicken soup supplement while pt on clears.  Per weight records, pt has lost 8 lbs since 7/22.   Labs reviewed. Medications: Magic Mouthwash QID, D5 and .45% NaCl infusion, Zofran PRN, Reglan PRN, Compazine PRN  NUTRITION - FOCUSED PHYSICAL EXAM:  Unable to perform -working remotely  Diet Order:   Diet Order            Diet clear liquid Room service appropriate? Yes; Fluid consistency: Thin  Diet effective now        Diet - low sodium heart healthy              EDUCATION NEEDS:   Education needs have been addressed  Skin:  Skin Assessment: Reviewed RN Assessment  Last BM:  7/29  Height:   Ht Readings from Last 1 Encounters:  07/03/19 5\' 2"  (1.575 m)    Weight:   Wt Readings from Last 1 Encounters:  07/12/19 59.8 kg    Ideal Body Weight:  50 kg  BMI:  Body mass index is  24.12 kg/m.  Estimated Nutritional Needs:   Kcal:  1308-6578  Protein:  80-90g  Fluid:  1.9L/day   Clayton Bibles, MS, RD, LDN [place] [facility] Pager: [phone] After Hours Pager: [phone]

## 2019-07-13 NOTE — Progress Notes (Addendum)
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 Active Problems:   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HCC)   History of postoperative nausea   Cecal volvulus (HCC)   IBS (irritable bowel syndrome)   Headache   GERD (gastroesophageal reflux disease)   10 Days Post-Op  07/03/2019  POST-OPERATIVE DIAGNOSIS:  PROXIMAL COLON BASCULE WITH INTERMITTENT RIGHT COLON VOLVULUS  PROCEDURE:   ROBOTIC PROXIMAL COLECTOMY OMENTOPEXY ASSESSMENT OF PERFUSION WITH FIREFLY IMMUNOFLUORESCENCE BILATERAL TAP BLOCK  SURGEON:  [name], MD    Assessment  Ileus w chronic nausea -hopefully resolving   [facility] Stay = 10 days)  Plan:  Clamping NG tube trial.  If tolerates, DC tomorrow and advance diet.  Hopefully discharge in a few days.  If cannot tolerate clamping, resume suction and reevaluate on Monday with possible CAT scan and parenteral TNA nutrition.  Check electrolytes and correct as needed.    Meds for insomnia.  Increase diurnal activity  Acute postoperative anemia on top of some chronic anemia.  Low iron.  Iron deficiency.  IV iron given given poor p.o. tolerance and IBS with oral iron.  PRN nausea medicines for now since nasogastric tube in place.  CT scan if worse.  H/o Headache.  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  She is not thrombocytopenic.  Return to ibuprofen as needed.  Follow platelets  GERD -  PPI BID  VTE prophylaxis- SCDs, etc  Mobilize as tolerated to help recovery.  GET HER UP!!      30 minutes spent in review, evaluation, examination, counseling, and coordination of care.  More than 50% of that time was spent in counseling.  I updated the patient's status to the patient and family.  Recommendations were made.  Questions were answered.  They expressed understanding & appreciation.  07/13/2019    Subjective: (Chief complaint)  Less nausea.  Walking more.  Did feel some twinges of discomfort in the right upper quadrant that concerned her.  Not persistent now.  Objective:  Vital signs:  Vitals:   07/12/19 0512 07/12/19 1036 07/12/19 1441 07/12/19 2055  BP: 129/69  114/66 130/75  Pulse: 97  78 87  Resp: 17  14 18   Temp: 98.6 F (37 C) 97.8 F (36.6 C) 97.6 F (36.4 C) 98 F (36.7 C)  TempSrc: Oral Oral  Oral  SpO2: 98%  99% 100%  Weight: 59.8 kg     Height:        Last BM Date: 07/11/19  Intake/Output   Yesterday:  07/30 0701 - 07/31 0700 In: 1054.3 [P.O.:60; I.V.:944.3; IV Piggyback:50] Out: 1950 [Urine:600; Emesis/NG output:1350] This shift:  Total I/O In: 654.3 [P.O.:60; I.V.:594.3] Out: 750 [Urine:200; Emesis/NG output:550]  Bowel function:  Flatus: YES  BM:  YES  Drain: Bilious - NGT   Physical Exam:  General: Pt awake/alert/oriented x4 in no acute distress.  Sitting up in chair.  Alert.  Calm.  This is the best I have seen her despite the NGT.  Eyes: PERRL, normal EOM.  Sclera clear.  No icterus Neuro: CN II-XII intact w/o focal sensory/motor deficits. Lymph: No head/neck/groin lymphadenopathy Psych:  No delerium/psychosis/paranoia HENT: Normocephalic, Mucus membranes moist.  No thrush Neck: Supple, No tracheal deviation Chest: No chest wall pain w good excursion CV:  Pulses intact.  Regular rhythm MS: Normal AROM mjr joints.  No obvious deformity  Abdomen: Soft.  Nondistended.  Nontender.  Resolving ecchymosis along left  upper flank and anterior abdominal wall-stable and change in color/resolving.  No evidence of peritonitis.  No incarcerated hernias.  Incisions dry.  Ext:  No deformity.  No mjr edema.  No cyanosis Skin: No petechiae / purpura  Results:   Cultures: Recent Results (from the past 720 hour(s))  SARS Coronavirus 2 (Performed in [place] hospital lab)     Status: None   Collection Time: 06/29/19 10:42 AM   Specimen: Nasal Swab  Result Value Ref Range Status   SARS Coronavirus 2 NEGATIVE NEGATIVE Final    Comment: (NOTE) SARS-CoV-2 target nucleic acids are NOT DETECTED. The SARS-CoV-2 RNA is generally detectable in upper and lower respiratory specimens during the acute phase of infection. Negative results do not preclude SARS-CoV-2 infection, do not rule out co-infections with other pathogens, and should not be used as the sole basis for treatment or other patient management decisions. Negative results must be combined with clinical observations, patient history, and epidemiological information. The expected result is Negative. Fact Sheet for Patients: SugarRoll.be Fact Sheet for Healthcare Providers: https://www.woods-mathews.com/ This test is not yet approved or cleared by the [place] FDA and  has been authorized for detection and/or diagnosis of SARS-CoV-2 by FDA under an Emergency Use Authorization (EUA). This EUA will remain  in effect (meaning this test can be used) for the duration of the COVID-19 declaration under Section 56 4(b)(1) of the Act, 21 U.S.C. section 360bbb-3(b)(1), unless the authorization is terminated or revoked sooner. Performed at [facility], [place] [address] [place], [place] [address]     Labs: Results for orders placed or performed during the hospital encounter of 07/03/19 (from the past 48 hour(s))  Magnesium     Status: None   Collection Time: 07/12/19  3:33 AM  Result Value Ref Range    Magnesium 2.0 1.7 - 2.4 mg/dL    Comment: Performed at [facility], [place] [address], [place], [place] [address]  Basic metabolic panel     Status: Abnormal   Collection Time: 07/12/19  3:33 AM  Result Value Ref Range   Sodium 135 135 - 145 mmol/L   Potassium 4.5 3.5 - 5.1 mmol/L   Chloride 107 98 - 111 mmol/L   CO2 20 (L) 22 - 32 mmol/L   Glucose, Bld 105 (H) 70 - 99 mg/dL   BUN 7 6 - 20 mg/dL   Creatinine, Ser 0.59 0.44 - 1.00 mg/dL   Calcium 8.4 (L) 8.9 - 10.3 mg/dL   GFR calc non Af Amer >60 >60 mL/min   GFR calc Af Amer >60 >60 mL/min   Anion gap 8 5 - 15    Comment: Performed at [facility], [place] [address], [place], [place] [address]  CBC     Status: Abnormal   Collection Time: 07/12/19  3:33 AM  Result Value Ref Range   WBC 7.6 4.0 - 10.5 K/uL   RBC 3.21 (L) 3.87 - 5.11 MIL/uL   Hemoglobin 10.2 (L) 12.0 - 15.0 g/dL   HCT 31.4 (L)  36.0 - 46.0 %   MCV 97.8 80.0 - 100.0 fL   MCH 31.8 26.0 - 34.0 pg   MCHC 32.5 30.0 - 36.0 g/dL   RDW 13.1 11.5 - 15.5 %   Platelets 291 150 - 400 K/uL   nRBC 0.0 0.0 - 0.2 %    Comment: Performed at [facility], [place] [address], [place], [place] [address]    Imaging / Studies: No results found.  Medications / Allergies: per chart  Antibiotics: Anti-infectives (From admission, onward)   Start     Dose/Rate Route Frequency Ordered Stop   07/04/19 0000  cefoTEtan (CEFOTAN) 2 g in sodium chloride 0.9 % 100 mL IVPB     2 g 200 mL/hr over 30 Minutes Intravenous Every 12 hours 07/03/19 1658 07/04/19 0115   07/03/19 1531  clindamycin (CLEOCIN) 900 mg, gentamicin (GARAMYCIN) 240 mg in sodium chloride 0.9 % 1,000 mL for intraperitoneal lavage  Status:  Discontinued       As needed 07/03/19 1532 07/03/19 1543   07/03/19 1400  neomycin (MYCIFRADIN) tablet 1,000 mg  Status:  Discontinued     1,000 mg Oral 3 times per day 07/03/19 1127 07/03/19 1136   07/03/19 1400  metroNIDAZOLE (FLAGYL)  tablet 1,000 mg  Status:  Discontinued     1,000 mg Oral 3 times per day 07/03/19 1127 07/03/19 1136   07/03/19 1130  cefoTEtan (CEFOTAN) 2 g in sodium chloride 0.9 % 100 mL IVPB     2 g 200 mL/hr over 30 Minutes Intravenous On call to O.R. 07/03/19 1127 07/03/19 1301   07/03/19 0600  clindamycin (CLEOCIN) 900 mg, gentamicin (GARAMYCIN) 240 mg in sodium chloride 0.9 % 1,000 mL for intraperitoneal lavage  Status:  Discontinued      Irrigation To Surgery 07/02/19 0749 07/03/19 1658        Note: Portions of this report may have been transcribed using voice recognition software. Every effort was made to ensure accuracy; however, inadvertent computerized transcription errors may be present.   Any transcriptional errors that result from this process are unintentional.     [name], MD, FACS, MASCRS Gastrointestinal and Minimally Invasive Surgery    [address], [place], [place] [address] [phone] Main / Paging [phone] Fax

## 2019-07-14 MED ORDER — ENSURE ENLIVE PO LIQD
237.0000 mL | Freq: Two times a day (BID) | ORAL | Status: DC
  Administered 2019-07-14: 237 mL via ORAL

## 2019-07-14 NOTE — Progress Notes (Signed)
Patient was unable to take any oral medication or drink the unjury because of gaging and nausea. No vomiting.  Still has clamped NG.

## 2019-07-14 NOTE — Progress Notes (Signed)
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 Active Problems:   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HCC)   History of postoperative nausea   Cecal volvulus (HCC)   IBS (irritable bowel syndrome)   Headache   GERD (gastroesophageal reflux disease)  s/p Procedure(s): ROBOTIC PROXIMAL COLECTOMY 07/03/2019 -remove NG tube -advance to full liquid diet -saline lock -await return of bowel movement    LOS: 11 days  Chief Complaint/Subjective: Wants to go home, no nausea yesterday, +flatus  Objective: Vital signs in last 24 hours: Temp:  [97.6 F (36.4 C)-98.6 F (37 C)] 98.6 F (37 C) (08/01 0635) Pulse Rate:  [84-99] 99 (08/01 0635) Resp:  [16] 16 (08/01 0635) BP: (122-124)/(63-81) 124/81 (08/01 0635) SpO2:  [100 %] 100 % (08/01 0635) Weight:  [59.3 kg] 59.3 kg (08/01 0635) Last BM Date: 07/11/19  Intake/Output from previous day: 07/31 0701 - 08/01 0700 In: 2099.4 [P.O.:180; I.V.:1919.4] Out: 700 [Urine:700] Intake/Output this shift: No intake/output data recorded.  Lungs: nonlabored  Cardiovascular: RRR  Abd: soft, ATTP, incisions c/d/i  Extremities: no edema  Neuro: AOx4  Lab Results: CBC  Recent Labs    07/12/19 0333  WBC 7.6  HGB 10.2*  HCT 31.4*  PLT 291   BMET Recent Labs    07/12/19 0333  NA 135  K 4.5  CL 107  CO2 20*  GLUCOSE 105*  BUN 7  CREATININE 0.59  CALCIUM 8.4*   PT/INR No results for input(s): LABPROT, INR in the last 72 hours. ABG No results for input(s): PHART, HCO3 in the last 72 hours.  Invalid input(s): PCO2, PO2  Studies/Results:  Anti-infectives: Anti-infectives (From admission, onward)   Start     Dose/Rate Route Frequency Ordered Stop   07/04/19 0000  cefoTEtan (CEFOTAN) 2 g in sodium chloride 0.9 % 100 mL IVPB     2 g 200 mL/hr over 30 Minutes Intravenous Every 12 hours 07/03/19 1658 07/04/19 0115   07/03/19 1531  clindamycin (CLEOCIN) 900 mg, gentamicin (GARAMYCIN) 240 mg in sodium chloride 0.9 % 1,000 mL for intraperitoneal lavage  Status:  Discontinued       As needed 07/03/19 1532 07/03/19 1543   07/03/19 1400  neomycin (MYCIFRADIN) tablet 1,000 mg  Status:  Discontinued     1,000 mg Oral 3 times per day 07/03/19 1127 07/03/19 1136   07/03/19 1400  metroNIDAZOLE (FLAGYL) tablet 1,000 mg  Status:  Discontinued     1,000 mg Oral 3 times per day 07/03/19 1127 07/03/19 1136   07/03/19 1130  cefoTEtan (CEFOTAN) 2 g in sodium chloride 0.9 % 100 mL IVPB     2 g 200 mL/hr over 30 Minutes Intravenous On call to O.R. 07/03/19 1127 07/03/19 1301   07/03/19 0600  clindamycin (CLEOCIN) 900 mg, gentamicin (GARAMYCIN) 240 mg in sodium chloride 0.9 % 1,000 mL for intraperitoneal lavage  Status:  Discontinued      Irrigation To Surgery 07/02/19 0749 07/03/19 1658      Medications: Scheduled Meds: . bisacodyl  10 mg Rectal Daily  . calcium-vitamin D  2 tablet Oral Daily  . enoxaparin (LOVENOX) injection  40 mg Subcutaneous Q24H  . estradiol  1 mg Oral QHS  . feeding supplement (ENSURE ENLIVE)  237 mL Oral BID BM  . lip balm  1 application Topical BID  . magic mouthwash  15 mL Oral QID  . pantoprazole  40 mg Oral Daily  . progesterone  200 mg Oral QHS  . protein supplement  8 oz Oral BID   Continuous Infusions: . chlorproMAZINE (THORAZINE) IV 25 mg (07/10/19 1425)  . ondansetron [facility]) IV 8 mg (07/13/19 2120)   Or  . ondansetron (ZOFRAN) IV 8 mg (07/12/19 0823)   PRN Meds:.alum & mag hydroxide-simeth, chlorproMAZINE (THORAZINE) IV, diphenhydrAMINE **OR** diphenhydrAMINE, fentaNYL (SUBLIMAZE) injection, hydrALAZINE, ketorolac, LORazepam, LORazepam, metoCLOPramide (REGLAN) injection, metoprolol tartrate, ondansetron (ZOFRAN) IV **OR** ondansetron (ZOFRAN) IV, phenol, [DISCONTINUED] prochlorperazine **OR** prochlorperazine, sodium chloride flush  Mickeal Skinner, MD [facility]

## 2019-07-14 NOTE — Progress Notes (Signed)
Right eye red watery  and painful. Stated she woke up like this.  It is also swollen but she's able to open her eyes but stated it's a little blurry.  Benadryl given and cool compress applied.

## 2019-07-15 MED ORDER — FENTANYL CITRATE (PF) 100 MCG/2ML IJ SOLN: 25.0000 ug | INTRAMUSCULAR | Status: DC | PRN

## 2019-07-15 MED ORDER — HYDROCODONE-ACETAMINOPHEN 5-325 MG PO TABS: 1.0000 | ORAL_TABLET | ORAL | Status: DC | PRN

## 2019-07-15 NOTE — Progress Notes (Signed)
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 Active Problems:   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HCC)   History of postoperative nausea   Cecal volvulus (HCC)   IBS (irritable bowel syndrome)   Headache   GERD (gastroesophageal reflux disease)  s/p Procedure(s): ROBOTIC PROXIMAL COLECTOMY 07/03/2019 -tolerating liquids -advance diet -add norco for medium pain med -decrease fentanyl dose    LOS: 12 days  Chief Complaint/Subjective: Some pain yesterday, fentanyl too strong, would like to try a hydrocodone, +flatus, +bm  Objective: Vital signs in last 24 hours: Temp:  [98.1 F (36.7 C)-98.4 F (36.9 C)] 98.1 F (36.7 C) (08/02 0548) Pulse Rate:  [80-93] 86 (08/02 0548) Resp:  [16-17] 17 (08/02 0548) BP: (105-120)/(60-77) 105/60 (08/02 0548) SpO2:  [98 %-100 %] 98 % (08/02 0548) Weight:  [60.1 kg] 60.1 kg (08/02 0548) Last BM Date: 07/11/19  Intake/Output from previous day: 08/01 0701 - 08/02 0700 In: 360 [P.O.:360] Out: 300 [Urine:300] Intake/Output this shift: No intake/output data recorded.  Lungs: nonlabored  Cardiovascular: RRR  Abd: soft, NT, incisions c/d/i  Extremities: no edema  Neuro: AOx4  Lab Results: CBC  No results for input(s): WBC, HGB, HCT, PLT in the last 72 hours. BMET No results for input(s): NA, K, CL, CO2, GLUCOSE, BUN, CREATININE, CALCIUM in the last 72 hours. PT/INR No results for input(s): LABPROT, INR in the last 72 hours. ABG No results for input(s): PHART, HCO3 in the last 72 hours.  Invalid input(s): PCO2, PO2  Studies/Results:  Anti-infectives: Anti-infectives (From admission, onward)   Start     Dose/Rate Route Frequency Ordered Stop   07/04/19 0000  cefoTEtan (CEFOTAN) 2 g in sodium chloride 0.9 % 100 mL IVPB     2 g 200 mL/hr over 30 Minutes Intravenous Every 12 hours 07/03/19 1658 07/04/19 0115   07/03/19  1531  clindamycin (CLEOCIN) 900 mg, gentamicin (GARAMYCIN) 240 mg in sodium chloride 0.9 % 1,000 mL for intraperitoneal lavage  Status:  Discontinued       As needed 07/03/19 1532 07/03/19 1543   07/03/19 1400  neomycin (MYCIFRADIN) tablet 1,000 mg  Status:  Discontinued     1,000 mg Oral 3 times per day 07/03/19 1127 07/03/19 1136   07/03/19 1400  metroNIDAZOLE (FLAGYL) tablet 1,000 mg  Status:  Discontinued     1,000 mg Oral 3 times per day 07/03/19 1127 07/03/19 1136   07/03/19 1130  cefoTEtan (CEFOTAN) 2 g in sodium chloride 0.9 % 100 mL IVPB     2 g 200 mL/hr over 30 Minutes Intravenous On call to O.R. 07/03/19 1127 07/03/19 1301   07/03/19 0600  clindamycin (CLEOCIN) 900 mg, gentamicin (GARAMYCIN) 240 mg in sodium chloride 0.9 % 1,000 mL for intraperitoneal lavage  Status:  Discontinued      Irrigation To Surgery 07/02/19 0749 07/03/19 1658      Medications: Scheduled Meds:  bisacodyl  10 mg Rectal Daily   calcium-vitamin D  2 tablet Oral Daily   enoxaparin (LOVENOX) injection  40 mg Subcutaneous Q24H   estradiol  1 mg Oral QHS   feeding supplement (ENSURE ENLIVE)  237 mL Oral BID BM   lip balm  1 application Topical BID   pantoprazole  40 mg Oral Daily   progesterone  200 mg Oral QHS   protein supplement  8 oz Oral BID   Continuous Infusions:  chlorproMAZINE (THORAZINE) IV 25 mg (07/10/19 1425)   ondansetron (  ZOFRAN) IV 8 mg (07/13/19 2120)   Or   ondansetron (ZOFRAN) IV 8 mg (07/12/19 0823)   PRN Meds:.alum & mag hydroxide-simeth, chlorproMAZINE (THORAZINE) IV, diphenhydrAMINE **OR** diphenhydrAMINE, fentaNYL (SUBLIMAZE) injection, hydrALAZINE, HYDROcodone-acetaminophen, ketorolac, LORazepam, LORazepam, metoCLOPramide (REGLAN) injection, metoprolol tartrate, ondansetron (ZOFRAN) IV **OR** ondansetron (ZOFRAN) IV, phenol, [DISCONTINUED] prochlorperazine **OR** prochlorperazine, sodium chloride flush  Mickeal Skinner, MD [facility]

## 2019-07-16 MED FILL — PROCHLORPERAZINE 10 MG TAB: 10 | 10 days supply | Qty: 30 | Fill #0

## 2019-07-16 MED FILL — HYDROCODON-APAP 5-325: 5-325 | 4 days supply | Qty: 30 | Fill #0

## 2019-07-16 NOTE — Plan of Care (Signed)
Pt was discharged home today. Instructions were reviewed with patient, and questions were answered. Pt was taken to main entrance via wheelchair by NT.  

## 2019-07-16 NOTE — Discharge Summary (Signed)
Physician Discharge Summary    Patient ID: Christine Brady MRN: [id number] DOB/AGE: [birth year]  58 y.o.  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 - BENIGN APPENDIX WITH FIBROUS OBLITERATION OF THE TIP. - THREE BENIGN LYMPH NODES. [name] MD Pathologist, Electronic Signature (Case signed 07/05/2019)  Consults: None  Hospital Course:   Patient with history of bowel syndrome and intermittent crampy abdominal pain.  Found to have right colon bascule with intermittent severe volvulus [name].  Underwent resection.  Pathology benign.  The patient underwent the surgery above.  Patient struggled with postoperative nausea requiring adjustments to narcotics and nausea medications due to many intolerances.  Developed ileus.  Had to have nasogastric tube decompression with rehydration.  Eventually ileus began to resolve.  Tolerating clamping trial.  Postoperatively, the patient gradually mobilized and advanced to a solid diet.  Pain and other symptoms were treated  aggressively.    By the time of discharge, the patient was walking well the hallways, eating food, having flatus.  Pain was well-controlled on an oral medications.  Based on meeting discharge criteria and continuing to recover, I felt it was safe for the patient to be discharged from the hospital to further recover with close followup. Postoperative recommendations were discussed in detail.  They are written as well.  Discharged Condition: good  Discharge Exam: Blood pressure (!) 106/49, pulse 73, temperature 98.2 F (36.8 C), temperature source Oral, resp. rate 18, height 5\' 2"  (1.575 m), weight 58.4 kg, SpO2 98 %.  General: Pt awake/alert/oriented x4 in No acute distress.  Dressed normally.  Walking around room. Eyes: PERRL, normal EOM.  Sclera clear.  No icterus Neuro: CN II-XII intact w/o focal sensory/motor deficits. Lymph: No head/neck/groin lymphadenopathy Psych:  No delerium/psychosis/paranoia HENT: Normocephalic, Mucus membranes moist.  No thrush Neck: Supple, No tracheal deviation Chest: No chest wall pain w good excursion CV:  Pulses intact.  Regular rhythm MS: Normal AROM mjr joints.  No obvious deformity Abdomen: Soft.  Nondistended.  Nontender.  No evidence of peritonitis.  No incarcerated hernias. Ext:  SCDs BLE.  No mjr edema.  No cyanosis Skin: No petechiae / purpura  Diagnosis Colon, segmental resection, right - BENIGN COLORECTAL AND SMALL BOWEL-TYPE MUCOSA. - BENIGN APPENDIX WITH FIBROUS OBLITERATION OF THE TIP. - THREE BENIGN LYMPH NODES. [name] MD Pathologist, Electronic Signature (Case signed 07/05/2019) Specimen Christine Brady and Clinical Information Specimen(s) Obtained: Colon, segmental resection, right Specimen Clinical Information right colon vascule with volvulus (kp) Christine Brady Received fresh is a segment of intestine which includes 8 cm of terminal ileum and 54 cm of colon. The proximal colon is moderately dilated measuring 7 cm in diameter. The diameter  at the distal margin  measures 3.5 cm. The mucosa throughout the segment is glistening and tan. There are no extrinsic or intrinsic obstructions grossly identified. The appendix is present and measures 4.8 cm in length x 0.7 cm in diameter. Sections are submitted in six cassettes. A = proximal margin B = distal margin C-D = representative sections from proximal colon E = appendix F = lymph nodes. (GP:ah 07/04/19) Report signed out from the following location(s) Technical component and interpretation was performed at [facility], [place], [place] [address]. CLIA #: [id number], 1 of 1 Disposition:   Follow-up Information    Michael Boston, MD. Schedule an appointment as soon as possible for a visit in 3 week(s).   Specialty: General Surgery Why: To follow up after your operation, To follow up after your hospital stay Contact information: [place] [address] [phone]           Discharge disposition: 01-Home or Self Care       Discharge Instructions    Call MD for:   Complete by: As directed    FEVER > 101.5 F  (temperatures < 101.5 F are not significant)   Call MD for:   Complete by: As directed    FEVER > 101.5 F  (temperatures < 101.5 F are not significant)   Call MD for:  extreme fatigue   Complete by: As directed    Call MD for:  extreme fatigue   Complete by: As directed    Call MD for:  persistant dizziness or light-headedness   Complete by: As directed    Call MD for:  persistant dizziness or light-headedness   Complete by: As directed    Call MD for:  persistant nausea and vomiting   Complete by: As directed    Call MD for:  persistant nausea and vomiting   Complete by: As directed    Call MD for:  redness, tenderness, or signs of infection (pain, swelling, redness, odor or green/yellow discharge around incision site)   Complete by: As directed    Call MD for:  redness, tenderness, or signs  of infection (pain, swelling, redness, odor or green/yellow discharge around incision site)   Complete by: As directed    Call MD for:  severe uncontrolled pain   Complete by: As directed    Call MD for:  severe uncontrolled pain   Complete by: As directed    Diet - low sodium heart healthy   Complete by: As directed    Start with a bland diet such as soups, liquids, starchy foods, low fat foods, etc. the first few days at home. Gradually advance to a solid, low-fat, high fiber diet by the end of the first week at home.   Add a fiber supplement to your diet (Metamucil, etc) If you feel full, bloated, or constipated, stay on a full liquid or pureed/blenderized diet for a few days until you feel better and are no longer constipated.   Discharge instructions   Complete by: As directed    See Discharge Instructions If you are not getting better after two weeks or are noticing you are getting worse, contact our office [phone] for further advice.  We may need to adjust your medications, re-evaluate you in the office, send you to the emergency room, or see what other things we can do to help. The clinic staff is available to answer your questions during regular business hours (8:30am-5pm).  Please don't hesitate  to call and ask to speak to one of our nurses for clinical concerns.    A surgeon from [facility] is always on call at the hospitals 24 hours/day If you have a medical emergency, go to the nearest emergency room or call 911.   Discharge instructions   Complete by: As directed    See Discharge Instructions If you are not getting better after two weeks or are noticing you are getting worse, contact our office [phone] for further advice.  We may need to adjust your medications, re-evaluate you in the office, send you to the emergency room, or see what other things we can do to help. The clinic staff is available to answer your questions during regular business hours  (8:30am-5pm).  Please don't hesitate to call and ask to speak to one of our nurses for clinical concerns.    A surgeon from [facility] is always on call at the hospitals 24 hours/day If you have a medical emergency, go to the nearest emergency room or call 911.   Discharge wound care:   Complete by: As directed    It is good for closed incisions and even open wounds to be washed every day.  Shower every day.  Short baths are fine.  Wash the incisions and wounds clean with soap & water.    You may leave closed incisions open to air if it is dry.   You may cover the incision with clean gauze & replace it after your daily shower for comfort.   Discharge wound care:   Complete by: As directed    It is good for closed incisions and even open wounds to be washed every day.  Shower every day.  Short baths are fine.  Wash the incisions and wounds clean with soap & water.    You may leave closed incisions open to air if it is dry.   You may cover the incision with clean gauze & replace it after your daily shower for comfort.   Driving Restrictions   Complete by: As directed    You may drive when: - you are no longer taking narcotic prescription pain medication - you can comfortably wear a seatbelt - you can safely make sudden turns/stops without pain.   Driving Restrictions   Complete by: As directed    You may drive when: - you are no longer taking narcotic prescription pain medication - you can comfortably wear a seatbelt - you can safely make sudden turns/stops without pain.   Increase activity slowly   Complete by: As directed    Start light daily activities --- self-care, walking, climbing stairs- beginning the day after surgery.  Gradually increase activities as tolerated.  Control your pain to be active.  Stop when you are tired.  Ideally, walk several times a day, eventually an hour a day.   Most people are back to most day-to-day activities in a few weeks.  It takes 4-6 weeks  to get back to unrestricted, intense activity. If you can walk 30 minutes without difficulty, it is safe to try more intense activity such as jogging, treadmill, bicycling, low-impact aerobics, swimming, etc. Save the most intensive and strenuous activity for last (Usually 4-8 weeks after surgery) such as sit-ups, heavy lifting, contact sports, etc.  Refrain from any intense heavy lifting or straining until you are off narcotics for pain control.  You will have off days, but things should improve week-by-week. DO NOT PUSH THROUGH PAIN.  Let pain be your guide: If it hurts to do something, don't do it.   Increase activity slowly   Complete by: As directed    Start light daily activities --- self-care, walking, climbing stairs- beginning the day after surgery.  Gradually increase activities as tolerated.  Control your pain to be active.  Stop when you are tired.  Ideally, walk several times a day, eventually an hour a day.   Most people are back to most day-to-day activities in a few weeks.  It takes 4-6 weeks to get back to unrestricted, intense activity. If you can walk 30 minutes without difficulty, it is safe to try more intense activity such as jogging, treadmill, bicycling, low-impact aerobics, swimming, etc. Save the most intensive and strenuous activity for last (Usually 4-8 weeks after surgery) such as sit-ups, heavy lifting, contact sports, etc.  Refrain from any intense heavy lifting or straining until you are off narcotics for pain control.  You will have off days, but things should improve week-by-week. DO NOT PUSH THROUGH PAIN.  Let pain be your guide: If it hurts to do something, don't do it.   Lifting restrictions   Complete by: As directed    If you can walk 30 minutes without difficulty, it is safe to try more intense activity such as jogging, treadmill, bicycling, low-impact aerobics, swimming, etc. Save the most intensive and strenuous activity for last (Usually 4-8 weeks after  surgery) such as sit-ups, heavy lifting, contact sports, etc.   Refrain from any intense heavy lifting or straining until you are off narcotics for pain control.  You will have off days, but things should improve week-by-week. DO NOT PUSH THROUGH PAIN.  Let pain be your guide: If it hurts to do something, don't do it.  Pain is your body warning you to avoid that activity for another week until the pain goes down.   Lifting restrictions   Complete by: As directed    If you can walk 30 minutes without difficulty, it is safe to try more intense activity such as jogging, treadmill, bicycling, low-impact aerobics, swimming, etc. Save the most intensive and strenuous activity for last (Usually 4-8 weeks after surgery) such as sit-ups, heavy lifting, contact sports, etc.   Refrain from any intense heavy lifting or straining until you are off narcotics for pain control.  You will have off days, but things should improve week-by-week. DO NOT PUSH THROUGH PAIN.  Let pain be your guide: If it hurts to do something, don't do it.  Pain is your body warning you to avoid that activity for another week until the pain goes down.   May shower / Bathe   Complete by: As directed    May shower / Bathe   Complete by: As directed    May walk up steps   Complete by: As directed    May walk up steps   Complete by: As directed    Remove dressing in 72 hours   Complete by: As directed    Make sure all dressings are removed by the third day after surgery.  Leave incisions open to air.  OK to cover incisions with gauze or bandages as desired   Remove dressing in 72 hours   Complete by: As directed    Make sure all dressings are removed by the third day after surgery.  Leave incisions open to air.  OK to cover incisions with gauze or bandages as desired   Sexual Activity Restrictions  Complete by: As directed    You may have sexual intercourse when it is comfortable. If it hurts to do something, stop.   Sexual Activity  Restrictions   Complete by: As directed    You may have sexual intercourse when it is comfortable. If it hurts to do something, stop.      Allergies as of 07/16/2019      Reactions   Oxycodone    Skin crawling   Claritin-d [loratadine-pseudoephedrine Er] Rash   Codeine Rash      Medication List    TAKE these medications   Calcium 600-D 600-400 MG-UNIT Tabs Generic drug: Calcium Carbonate-Vitamin D3 Take 2 tablets by mouth daily.   Dexilant 60 MG capsule Generic drug: dexlansoprazole Take 60 mg by mouth daily.   estradiol 1 MG tablet Commonly known as: ESTRACE Take 1 mg by mouth at bedtime.   HYDROcodone-acetaminophen 5-325 MG tablet Commonly known as: Norco Take 1-2 tablets by mouth every 6 (six) hours as needed for moderate pain or severe pain.   ibuprofen 200 MG tablet Commonly known as: ADVIL Take 400 mg by mouth every 6 (six) hours as needed for headache or moderate pain.   linaclotide 290 MCG Caps capsule Commonly known as: LINZESS Take 290 mcg by mouth daily before breakfast.   polyethylene glycol 17 g packet Commonly known as: MIRALAX / GLYCOLAX Take 17 g by mouth daily.   PRESCRIPTION MEDICATION Take 1 capsule by mouth daily before breakfast. Ultra Flora Probiotic   prochlorperazine 10 MG tablet Commonly known as: COMPAZINE Take 1 tablet (10 mg total) by mouth every 8 (eight) hours as needed for refractory nausea / vomiting (Use for nausea and / or vomiting unresolved with ondansetron (Zofran)).   progesterone 200 MG capsule Commonly known as: PROMETRIUM Take 200 mg by mouth at bedtime.            Discharge Care Instructions  (From admission, onward)         Start     Ordered   07/16/19 0000  Discharge wound care:    Comments: It is good for closed incisions and even open wounds to be washed every day.  Shower every day.  Short baths are fine.  Wash the incisions and wounds clean with soap & water.    You may leave closed incisions open to  air if it is dry.   You may cover the incision with clean gauze & replace it after your daily shower for comfort.   07/16/19 0800   07/03/19 0000  Discharge wound care:    Comments: It is good for closed incisions and even open wounds to be washed every day.  Shower every day.  Short baths are fine.  Wash the incisions and wounds clean with soap & water.    You may leave closed incisions open to air if it is dry.   You may cover the incision with clean gauze & replace it after your daily shower for comfort.   07/03/19 1205          Significant Diagnostic Studies:  No results found for this or any previous visit (from the past 72 hour(s)).  No results found.  Past Medical History:  Diagnosis Date   Abdominal pain    Arthritis    difficulty walking   Atypical chest pain 11/11/2016   Cholesteatoma of right ear    Chronic headaches    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[place]) 11/11/2016   Cyst    back  Hearing aid worn    History of ITP    Hyperlipidemia 11/11/2016   Large ovary 07/09/2019   PONV (postoperative nausea and vomiting)    Reflux     Past Surgical History:  Procedure Laterality Date   CHOLECYSTECTOMY  2010   ESOPHAGOGASTRODUODENOSCOPY (EGD) WITH PROPOFOL N/A 06/12/2019   Procedure: ESOPHAGOGASTRODUODENOSCOPY (EGD) WITH PROPOFOL;  Surgeon: Carol Ada, MD;  Location: [facility];  Service: Endoscopy;  Laterality: N/A;   EXTERNAL EAR SURGERY  2009/2010   EXTERNAL EAR SURGERY  2010   right ear   KNEE ARTHROSCOPY     right   KNEE LIGAMENT RECONSTRUCTION     left   MASTOIDECTOMY Right 01/19/2017   Procedure: RIGHT MODIFIED RADICAL MASTOIDECTOMY;  Surgeon: Vicie Mutters, MD;  Location: [facility];  Service: ENT;  Laterality: Right;   UPPER ESOPHAGEAL ENDOSCOPIC ULTRASOUND (EUS) N/A 06/12/2019   Procedure: UPPER ESOPHAGEAL ENDOSCOPIC ULTRASOUND (EUS);  Surgeon: Carol Ada, MD;  Location: [facility];  Service: Endoscopy;   Laterality: N/A;   WISDOM TOOTH EXTRACTION      Social History   Socioeconomic History   Marital status: Single    Spouse name: Not on file   Number of children: Not on file   Years of education: Not on file   Highest education level: Not on file  Occupational History   Not on file  Social Needs   Financial resource strain: Not on file   Food insecurity    Worry: Not on file    Inability: Not on file   Transportation needs    Medical: Not on file    Non-medical: Not on file  Tobacco Use   Smoking status: Never Smoker   Smokeless tobacco: Never Used  Substance and Sexual Activity   Alcohol use: Yes    Comment: occas   Drug use: No   Sexual activity: Not on file  Lifestyle   Physical activity    Days per week: Not on file    Minutes per session: Not on file   Stress: Not on file  Relationships   Social connections    Talks on phone: Not on file    Gets together: Not on file    Attends religious service: Not on file    Active member of club or organization: Not on file    Attends meetings of clubs or organizations: Not on file    Relationship status: Not on file   Intimate partner violence    Fear of current or ex partner: Not on file    Emotionally abused: Not on file    Physically abused: Not on file    Forced sexual activity: Not on file  Other Topics Concern   Not on file  Social History Narrative   Not on file    Family History  Problem Relation Age of Onset   Diabetes Mother    Asthma Mother    Diabetes type II Mother    Alcohol abuse Mother    Hypertension Mother    Diabetes Father    Hypertension Father    Kidney disease Father    Diabetes type II Father    Alcohol abuse Father    Crohn's disease Sister    Cerebral palsy Brother    COPD Sister    Thrombocytopenia Sister     Current Facility-Administered Medications  Medication Dose Route Frequency Provider Last Rate Last Dose   alum & mag  hydroxide-simeth (MAALOX/MYLANTA) 200-200-20 MG/5ML suspension 30 mL  30  mL Oral Q6H PRN Michael Boston, MD   30 mL at 07/10/19 1329   bisacodyl (DULCOLAX) suppository 10 mg  10 mg Rectal Daily Michael Boston, MD   10 mg at 07/15/19 1225   calcium-vitamin D (OSCAL WITH D) 500-200 MG-UNIT per tablet 2 tablet  2 tablet Oral Daily Michael Boston, MD   2 tablet at 07/15/19 1100   chlorproMAZINE (THORAZINE) 25 mg in sodium chloride 0.9 % 25 mL IVPB  25 mg Intravenous Q6H PRN Michael Boston, MD 50 mL/hr at 07/10/19 1425 25 mg at 07/10/19 1425   diphenhydrAMINE (BENADRYL) 12.5 MG/5ML elixir 12.5 mg  12.5 mg Oral Q6H PRN Michael Boston, MD       Or   diphenhydrAMINE (BENADRYL) injection 12.5 mg  12.5 mg Intravenous Q6H PRN Michael Boston, MD   12.5 mg at 07/14/19 0425   enoxaparin (LOVENOX) injection 40 mg  40 mg Subcutaneous Q24H Michael Boston, MD   40 mg at 07/15/19 0809   estradiol (ESTRACE) tablet 1 mg  1 mg Oral Ardeen Fillers, MD   1 mg at 07/15/19 2122   feeding supplement (ENSURE ENLIVE) (ENSURE ENLIVE) liquid 237 mL  237 mL Oral BID BM Kinsinger, Arta Bruce, MD   237 mL at 07/14/19 1034   fentaNYL (SUBLIMAZE) injection 25 mcg  25 mcg Intravenous Q2H PRN Kinsinger, Arta Bruce, MD       hydrALAZINE (APRESOLINE) injection 10 mg  10 mg Intravenous Q2H PRN Michael Boston, MD       HYDROcodone-acetaminophen (NORCO/VICODIN) 5-325 MG per tablet 1 tablet  1 tablet Oral Q4H PRN Kinsinger, Arta Bruce, MD       lip balm (CARMEX) ointment 1 application  1 application Topical BID Michael Boston, MD   1 application at 73/22/02 2151   LORazepam (ATIVAN) injection 0.5-1 mg  0.5-1 mg Intravenous Q8H PRN Michael Boston, MD   1 mg at 07/12/19 0752   LORazepam (ATIVAN) injection 1-2 mg  1-2 mg Intravenous QHS PRN,MR X 1 Michael Boston, MD   1 mg at 07/15/19 2123   metoCLOPramide (REGLAN) injection 5-10 mg  5-10 mg Intravenous Q8H PRN Michael Boston, MD   10 mg at 07/13/19 0847   metoprolol tartrate (LOPRESSOR)  injection 5 mg  5 mg Intravenous Q6H PRN Michael Boston, MD       ondansetron (ZOFRAN) 8 mg in sodium chloride 0.9 % 50 mL IVPB  8 mg Intravenous Q6H PRN Michael Boston, MD 216 mL/hr at 07/13/19 2120 8 mg at 07/13/19 2120   Or   ondansetron (ZOFRAN) 8 mg in sodium chloride 0.9 % 50 mL IVPB  8 mg Intravenous Q6H PRN Michael Boston, MD 216 mL/hr at 07/12/19 0823 8 mg at 07/12/19 0823   pantoprazole (PROTONIX) EC tablet 40 mg  40 mg Oral Daily Michael Boston, MD   40 mg at 07/15/19 1102   phenol (CHLORASEPTIC) mouth spray 1 spray  1 spray Mouth/Throat PRN Rolm Bookbinder, MD   1 spray at 07/11/19 0424   prochlorperazine (COMPAZINE) injection 5-10 mg  5-10 mg Intravenous Q6H PRN Michael Boston, MD   10 mg at 07/13/19 1300   progesterone (PROMETRIUM) capsule 200 mg  200 mg Oral Ardeen Fillers, MD   200 mg at 07/15/19 2122   protein supplement (UNJURY CHICKEN SOUP) powder 8 oz  8 oz Oral BID Michael Boston, MD   8 oz at 07/15/19 0810   sodium chloride flush (NS) 0.9 % injection 10-40 mL  10-40 mL Intracatheter PRN Leavy Heatherly,  Remo Lipps, MD         Allergies  Allergen Reactions   Oxycodone     Skin crawling   Claritin-D [Loratadine-Pseudoephedrine Er] Rash   Codeine Rash    Signed: Morton Peters, MD, FACS, MASCRS [facility]    [address], [place], [place] [address] [phone] Main / Paging [phone] Fax   07/16/2019, 8:01 AM

## 2019-07-18 ENCOUNTER — Other Ambulatory Visit: Payer: Self-pay | Admitting: *Deleted

## 2019-07-18 NOTE — Patient Outreach (Signed)
[facility]) Care Management  07/18/2019  TERRANCE LANAHAN 10-Apr-1961 [id number]   Transition of care call/Case Closure   Referral received : 07/16/2019 Initial outreach : 07/18/2019 Insurance : [place] plan   Subjective: Initial successful telephone call to patient's preferred number in order to completed transition of care assessment ; 2 HIPAA identifiers verified. Explained purpose of the call and completed transition of care assessment .  She reports that she is doing fairly well, she discussed he hospital stay and  shared her problem with ileus and having a NG tube after surgery, discussed situation of ileus after surgery, she voiced being to finally be at home and on the mend. She discussed having  some problem with nausea , no vomiting. She report taking nausea medication as prescribed she has received a follow up call from surgeon office on today and has been instructed to call if nausea not improved  in the next day or so she denies vomiting.  She reports pain control with prescribed medication encouraged having a small snack along with pain medication to decrease nausea . She reports tolerating diet eating small bland meals that are comforting and doing well with drinking fluids. She reports passing gas and has had loose stools. . She is tolerating ambulation in the home was a little dizzy on her 1st day home and realized she needs to move a little slower, discussed changing positions slowly sitting and standing a few second before she starts walk, she has taken a shower with someone has been in home with her. Discussed benefit of increasing activity as tolerated, walking changes positions during the day, she describes using a pillow to splint her abdominal incision area when moving in bed.  She reports that her incision is healing well, no fever, no redness. Patient has friend that stays at night with her and neighbor that checks on her during the day and brings food.   Patient discussed having FMLA and has benefit of hospital indemnity insurance.   Objective Sherine Bunnell was hospitalized at [facility] from 07/03/19-07/16/2019, for volvulus ascending colon, s/p Robotic Colectomy, post op ileus.  Comorbidities include: Chronic ITP, Irritable bowel syndrome  Patient was discharged home on 07/16/2019 without the need of home health services or durable medical equipment.   Assessment  Patient voices good understanding of all discharge instructions See transition of care assessment for details.   Plan Reviewed hospital discharge diagnosis of Robotic Colectomy and treatment plan using hospital discharge instructions, assessing medication adherence, reviewing postoperative problems requiring provider notification and discussing the importance of follow up with surgeon, primary care provider  as directed.  Reviewed [place]'s Active Health Management 2020 Wellness Requirements of : Completing the computerized Health Assessment and the Health Action Step with Active Health Management [facility]) by August 14 2019 AND have an annual physical between December 13, 2017 and August 14, 2019 in order to qualify for the 2021 Healthy Lifestyle Premium. - Physical deadline has been extended. Due to COVID 19 and the need to postpone routine annual physicals, the deadline for your annual physical has been extended to Sept.1, 2020 . You do not need to have your physical complete by August 14, 2019, rescheduling your appointment by Sept.1, 2020 will count as completion of this requirement no matter when the actual physical is completed. Please send your rescheduled appointment information to [email or web address]. She discussed having her physical already scheduled.    Reviewed [place]'s Spiritual Care Advanced Directive document completion benefit and  current waiver of 2 witnesses, and only notary required due to Covid-19 state of emergency. Included Advanced  Directive packet in mailings to patient's home.  No ongoing care management needs identified so will close case to [place] Management  services and route successful outreach letter with [place] Management pamphlet and 24 Hour Nurse Line Magnet to [place] Management clinical pool to be mailed to patient's home address.    Joylene Draft, RN, [place] Management Coordinator  [phone]- Mobile [phone]- Toll Free Main Office

## 2019-07-26 ENCOUNTER — Other Ambulatory Visit: Payer: Self-pay | Admitting: Surgery

## 2019-07-26 ENCOUNTER — Other Ambulatory Visit (HOSPITAL_COMMUNITY): Payer: Self-pay | Admitting: Surgery

## 2019-07-26 DIAGNOSIS — Z9049 Acquired absence of other specified parts of digestive tract: Secondary | ICD-10-CM

## 2019-07-26 MED FILL — HYDROCODON-APAP 5-325: 5-325 | 4 days supply | Qty: 30 | Fill #0

## 2019-07-27 ENCOUNTER — Ambulatory Visit (HOSPITAL_COMMUNITY)
Admission: RE | Admit: 2019-07-27 | Discharge: 2019-07-27 | Disposition: A | Discharge status: Home / Self Care | Payer: 59 | Source: Ambulatory Visit | Attending: Surgery | Admitting: Surgery

## 2019-07-27 ENCOUNTER — Other Ambulatory Visit: Payer: Self-pay

## 2019-07-27 ENCOUNTER — Encounter (HOSPITAL_COMMUNITY): Payer: Self-pay

## 2019-07-27 DIAGNOSIS — R1011 Right upper quadrant pain: Secondary | ICD-10-CM | POA: Diagnosis not present

## 2019-07-27 DIAGNOSIS — Z9049 Acquired absence of other specified parts of digestive tract: Secondary | ICD-10-CM | POA: Diagnosis not present

## 2019-07-27 MED ORDER — SODIUM CHLORIDE (PF) 0.9 % IJ SOLN
INTRAMUSCULAR | Status: AC
  Filled 2019-07-27: qty 50

## 2019-07-27 MED ORDER — IOHEXOL 300 MG/ML  SOLN
100.0000 mL | Freq: Once | INTRAMUSCULAR | Status: AC | PRN
  Administered 2019-07-27: 100 mL via INTRAVENOUS

## 2019-07-30 DIAGNOSIS — Z1231 Encounter for screening mammogram for malignant neoplasm of breast: Secondary | ICD-10-CM | POA: Diagnosis not present

## 2019-07-30 DIAGNOSIS — Z6824 Body mass index (BMI) 24.0-24.9, adult: Secondary | ICD-10-CM | POA: Diagnosis not present

## 2019-07-30 DIAGNOSIS — Z79890 Hormone replacement therapy (postmenopausal): Secondary | ICD-10-CM | POA: Diagnosis not present

## 2019-07-30 DIAGNOSIS — K562 Volvulus: Secondary | ICD-10-CM | POA: Diagnosis not present

## 2019-07-30 DIAGNOSIS — Z01419 Encounter for gynecological examination (general) (routine) without abnormal findings: Secondary | ICD-10-CM | POA: Diagnosis not present

## 2019-07-31 DIAGNOSIS — Z1331 Encounter for screening for depression: Secondary | ICD-10-CM | POA: Diagnosis not present

## 2019-07-31 DIAGNOSIS — M67912 Unspecified disorder of synovium and tendon, left shoulder: Secondary | ICD-10-CM | POA: Diagnosis not present

## 2019-07-31 DIAGNOSIS — Z6825 Body mass index (BMI) 25.0-25.9, adult: Secondary | ICD-10-CM | POA: Diagnosis not present

## 2019-08-13 DIAGNOSIS — M7502 Adhesive capsulitis of left shoulder: Secondary | ICD-10-CM | POA: Diagnosis not present

## 2019-08-14 ENCOUNTER — Ambulatory Visit (INDEPENDENT_AMBULATORY_CARE_PROVIDER_SITE_OTHER): Payer: 59 | Admitting: Gastroenterology

## 2019-08-14 ENCOUNTER — Encounter: Payer: Self-pay | Admitting: Gastroenterology

## 2019-08-14 VITALS — BP 120/80 | HR 65 | Temp 98.8°F | Ht 62.5 in | Wt 136.0 lb

## 2019-08-14 DIAGNOSIS — Z9049 Acquired absence of other specified parts of digestive tract: Secondary | ICD-10-CM | POA: Diagnosis not present

## 2019-08-14 DIAGNOSIS — K580 Irritable bowel syndrome with diarrhea: Secondary | ICD-10-CM | POA: Diagnosis not present

## 2019-08-14 DIAGNOSIS — K219 Gastro-esophageal reflux disease without esophagitis: Secondary | ICD-10-CM | POA: Diagnosis not present

## 2019-08-14 NOTE — Patient Instructions (Signed)
Take FD Gard 1 capsule three times daily  STOP Linzess  Continue Dexilant   I appreciate the  opportunity to care for you  Thank You   Harl Bowie , MD

## 2019-08-14 NOTE — Progress Notes (Signed)
Christine Brady    [id number]    01/15/1961  Primary Care Physician:Holt, Gara Kroner, MD  Referring Physician: Ronita Hipps, MD [place] [id number],    Chief complaint:  Nausea, diarrhea, fecal urgency  HPI:  60 yr F previously  Followed by Dr Collene Mares is here to establish care.  She is 6 weeks postop from proximal colectomy for ?  Intermittent cecal volvulus She is doing well postoperatively, has mild nausea and lower abdominal discomfort.  Denies any vomiting.  No melena or blood per rectum. She is having loose stool with increased fecal urgency and frequency.  She is still taking Linzess GERD symptoms stable on Dexilant.  Intermittent dyspepsia along with nausea  EGD 05/21/2019: Normal esophagus. Fundic gland polyps removed.  Colonoscopy Normal, due for recall colonoscopy in 2025  EGD with EUS 06/12/19: Normal EGD. Mild dilation CBD 84mm s/p cholecystectomy.Normal pancreas.  Robotic proximal colectomy 07/03/19 for intermittent volvulus   Outpatient Encounter Medications as of 08/14/2019  Medication Sig  . Calcium Carbonate-Vitamin D3 (CALCIUM 600-D) 600-400 MG-UNIT TABS Take 2 tablets by mouth daily.  Marland Kitchen dexlansoprazole (DEXILANT) 60 MG capsule Take 60 mg by mouth daily.  Marland Kitchen estradiol (ESTRACE) 1 MG tablet Take 1 mg by mouth at bedtime.   Marland Kitchen ibuprofen (ADVIL) 200 MG tablet Take 400 mg by mouth every 6 (six) hours as needed for headache or moderate pain.  Marland Kitchen linaclotide (LINZESS) 290 MCG CAPS capsule Take 290 mcg by mouth daily before breakfast.  . polyethylene glycol (MIRALAX / GLYCOLAX) 17 g packet Take 17 g by mouth daily.  Marland Kitchen PRESCRIPTION MEDICATION Take 1 capsule by mouth daily before breakfast. Ultra Flora Probiotic  . prochlorperazine (COMPAZINE) 10 MG tablet Take 1 tablet (10 mg total) by mouth every 8 (eight) hours as needed for refractory nausea / vomiting (Use for nausea and / or vomiting unresolved with ondansetron (Zofran)).  . progesterone  (PROMETRIUM) 200 MG capsule Take 200 mg by mouth at bedtime.  . [DISCONTINUED] HYDROcodone-acetaminophen (NORCO) 5-325 MG tablet Take 1-2 tablets by mouth every 6 (six) hours as needed for moderate pain or severe pain.   No facility-administered encounter medications on file as of 08/14/2019.     Allergies as of 08/14/2019 - Review Complete 08/14/2019  Allergen Reaction Noted  . Oxycodone  06/04/2019  . Claritin-d [loratadine-pseudoephedrine er] Rash 10/27/2011  . Codeine Rash 10/27/2011    Past Medical History:  Diagnosis Date  . Abdominal pain   . Arthritis    difficulty walking  . Atypical chest pain 11/11/2016  . Cholesteatoma of right ear   . Chronic headaches   . 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HCC) 11/11/2016  . Cyst    back  . Hearing aid worn   . History of ITP   . Hyperlipidemia 11/11/2016  . Large ovary 07/09/2019  . PONV (postoperative nausea and vomiting)   . Reflux     Past Surgical History:  Procedure Laterality Date  . CHOLECYSTECTOMY  2010  . ESOPHAGOGASTRODUODENOSCOPY (EGD) WITH PROPOFOL N/A 06/12/2019   Procedure: ESOPHAGOGASTRODUODENOSCOPY (EGD) WITH PROPOFOL;  Surgeon: Carol Ada, MD;  Location: [facility];  Service: Endoscopy;  Laterality: N/A;  . EXTERNAL EAR SURGERY  2009/2010  . EXTERNAL EAR SURGERY  2010   right ear  . KNEE ARTHROSCOPY     right  . KNEE LIGAMENT RECONSTRUCTION     left  . MASTOIDECTOMY Right 01/19/2017   Procedure: RIGHT MODIFIED RADICAL  MASTOIDECTOMY;  Surgeon: Vicie Mutters, MD;  Location: [facility];  Service: ENT;  Laterality: Right;  . UPPER ESOPHAGEAL ENDOSCOPIC ULTRASOUND (EUS) N/A 06/12/2019   Procedure: UPPER ESOPHAGEAL ENDOSCOPIC ULTRASOUND (EUS);  Surgeon: Carol Ada, MD;  Location: [facility];  Service: Endoscopy;  Laterality: N/A;  . WISDOM TOOTH EXTRACTION      Family History  Problem Relation Age of Onset  . Diabetes Mother   . Asthma Mother   . Diabetes type II Mother   .  Alcohol abuse Mother   . Hypertension Mother   . Diabetes Father   . Hypertension Father   . Kidney disease Father   . Diabetes type II Father   . Alcohol abuse Father   . Crohn's disease Sister   . Cerebral palsy Brother   . COPD Sister   . Thrombocytopenia Sister     Social History   Socioeconomic History  . Marital status: Single    Spouse name: Not on file  . Number of children: Not on file  . Years of education: Not on file  . Highest education level: Not on file  Occupational History  . Not on file  Social Needs  . Financial resource strain: Not on file  . Food insecurity    Worry: Not on file    Inability: Not on file  . Transportation needs    Medical: Not on file    Non-medical: Not on file  Tobacco Use  . Smoking status: Never Smoker  . Smokeless tobacco: Never Used  Substance and Sexual Activity  . Alcohol use: Yes    Comment: occas  . Drug use: No  . Sexual activity: Not on file  Lifestyle  . Physical activity    Days per week: Not on file    Minutes per session: Not on file  . Stress: Not on file  Relationships  . Social Herbalist on phone: Not on file    Gets together: Not on file    Attends religious service: Not on file    Active member of club or organization: Not on file    Attends meetings of clubs or organizations: Not on file    Relationship status: Not on file  . Intimate partner violence    Fear of current or ex partner: Not on file    Emotionally abused: Not on file    Physically abused: Not on file    Forced sexual activity: Not on file  Other Topics Concern  . Not on file  Social History Narrative  . Not on file      Review of systems: Review of Systems  Constitutional: Negative for fever and chills.  HENT: Negative.   Eyes: Negative for blurred vision.  Respiratory: Negative for cough, shortness of breath and wheezing.   Cardiovascular: Negative for chest pain and palpitations.  Gastrointestinal: as per HPI  Genitourinary: Negative for dysuria, urgency, frequency and hematuria.  Musculoskeletal: Positive for myalgias, back pain and joint pain.  Skin: Negative for itching and rash.  Neurological: Negative for dizziness, tremors, focal weakness, seizures and loss of consciousness.  Endo/Heme/Allergies: Negative Psychiatric/Behavioral: Negative for depression, suicidal ideas and hallucinations. Positive for anxiety. All other systems reviewed and are negative.   Physical Exam: Vitals:   08/14/19 0858  BP: 120/80  Pulse: 65  Temp: 98.8 F (37.1 C)   Body mass index is 24.48 kg/m. Gen:      No acute distress HEENT:  EOMI, sclera anicteric  Neck:     No masses; no thyromegaly Lungs:    Clear to auscultation bilaterally; normal respiratory effort CV:         Regular rate and rhythm; no murmurs Abd:      + bowel sounds; soft, non-tender; no palpable masses, no distension Ext:    No edema; adequate peripheral perfusion Skin:      Warm and dry; no rash Neuro: alert and oriented x 3 Psych: normal mood and affect  Data Reviewed:  Reviewed labs, radiology imaging, old records and pertinent past GI work up   Assessment and Plan/Recommendations:  58 year old with history of chronic GERD, chronic constipation, status post proximal colectomy for 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 Denzil Magnuson , MD    CC: Ronita Hipps, MD

## 2019-08-16 DIAGNOSIS — M6281 Muscle weakness (generalized): Secondary | ICD-10-CM | POA: Diagnosis not present

## 2019-08-16 DIAGNOSIS — M25612 Stiffness of left shoulder, not elsewhere classified: Secondary | ICD-10-CM | POA: Diagnosis not present

## 2019-08-16 DIAGNOSIS — M25512 Pain in left shoulder: Secondary | ICD-10-CM | POA: Diagnosis not present

## 2019-08-17 DIAGNOSIS — M6281 Muscle weakness (generalized): Secondary | ICD-10-CM | POA: Diagnosis not present

## 2019-08-17 DIAGNOSIS — M25612 Stiffness of left shoulder, not elsewhere classified: Secondary | ICD-10-CM | POA: Diagnosis not present

## 2019-08-17 DIAGNOSIS — M25512 Pain in left shoulder: Secondary | ICD-10-CM | POA: Diagnosis not present

## 2019-08-30 DIAGNOSIS — M25612 Stiffness of left shoulder, not elsewhere classified: Secondary | ICD-10-CM | POA: Diagnosis not present

## 2019-08-30 DIAGNOSIS — M25512 Pain in left shoulder: Secondary | ICD-10-CM | POA: Diagnosis not present

## 2019-08-30 DIAGNOSIS — M6281 Muscle weakness (generalized): Secondary | ICD-10-CM | POA: Diagnosis not present

## 2019-09-03 MED FILL — DEXILANT DR 60 MG CAPSULE: 60 | 90 days supply | Qty: 90 | Fill #1

## 2019-09-03 MED FILL — ESTRADIOL 1 MG TABS: 1 | 90 days supply | Qty: 90 | Fill #0

## 2019-09-04 MED FILL — PROGESTERONE 100 MG CAPSULE: 100 | 90 days supply | Qty: 90 | Fill #0

## 2019-09-20 ENCOUNTER — Encounter: Payer: Self-pay | Admitting: Gastroenterology

## 2019-09-20 ENCOUNTER — Ambulatory Visit: Payer: 59 | Admitting: Gastroenterology

## 2019-09-20 ENCOUNTER — Ambulatory Visit (INDEPENDENT_AMBULATORY_CARE_PROVIDER_SITE_OTHER): Payer: 59 | Admitting: Gastroenterology

## 2019-09-20 VITALS — BP 112/76 | HR 62 | Temp 97.6°F | Ht 62.5 in | Wt 137.2 lb

## 2019-09-20 DIAGNOSIS — K588 Other irritable bowel syndrome: Secondary | ICD-10-CM | POA: Diagnosis not present

## 2019-09-20 DIAGNOSIS — R140 Abdominal distension (gaseous): Secondary | ICD-10-CM | POA: Diagnosis not present

## 2019-09-20 DIAGNOSIS — R1013 Epigastric pain: Secondary | ICD-10-CM

## 2019-09-20 DIAGNOSIS — K219 Gastro-esophageal reflux disease without esophagitis: Secondary | ICD-10-CM

## 2019-09-20 DIAGNOSIS — K5904 Chronic idiopathic constipation: Secondary | ICD-10-CM | POA: Diagnosis not present

## 2019-09-20 MED ORDER — OMEPRAZOLE 40 MG PO CPDR: 40.0000 mg | DELAYED_RELEASE_CAPSULE | Freq: Every day | ORAL | 3 refills | Status: DC

## 2019-09-20 MED FILL — OMEPRAZOLE 40 MG CPDR: 40 | 90 days supply | Qty: 90 | Fill #0

## 2019-09-20 NOTE — Patient Instructions (Addendum)
Use FD and IBGard 1 capsule three times a day as needed  Take Benefiber 1 teaspoon twice daily  Increase water intake 8-10 cups daily  Take Miralax 1 capful daily  Go to the basement for labs today  HPylori and Giardia , you will need to be off your PPI and H2blocker (Dexilant)  for 10days prior to submitting your test   Switch to Omeprazole 40 mg daily   If you are age 57 or older, your body mass index should be between 23-30. Your Body mass index is 24.7 kg/m. If this is out of the aforementioned range listed, please consider follow up with your Primary Care Provider.  If you are age 69 or younger, your body mass index should be between 19-25. Your Body mass index is 24.7 kg/m. If this is out of the aformentioned range listed, please consider follow up with your Primary Care Provider.    I appreciate the  opportunity to care for you  Thank You   Harl Bowie , MD

## 2019-09-20 NOTE — Progress Notes (Signed)
Christine Brady    [id number]    07-07-1961  Primary Care Physician:Holt, Gara Kroner, MD  Referring Physician: Ronita Hipps, MD [place] [id number],    Chief complaint:  Constipation  HPI: 65 yr F with h/o chronic constipation s/p proximal colectomy for intermittent cecal volvulus is here for follow up  No longer has fecal urgency or increased frequency since she stopped taking Linzess.   She is exteremly anxious about getting recurrent constipation, takes Linzess intermittently if she doesn't have a BM at least once a day and or feels has not completely evacuated.  She is continuing Miralax 1 capful daily.  C/o intermittent abdominal bloating and lower abd cramping with fullness.  She is taking Dexilant daily, heartburn and dyspepsia improved. FD gard is helping with indigestion and dyspepsia.  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  She has well water, uses water filter but is worried about infections.  Denies any nausea, vomiting, abdominal pain, melena or bright red blood per rectum  Relevant GI Hx:  EGD 05/21/2019: Normal esophagus. Fundic gland polyps removed.  Colonoscopy Normal, due for recall colonoscopy in 2025  EGD with EUS 06/12/19: Normal EGD. Mild dilation CBD 54mm s/p cholecystectomy.Normal pancreas.  Robotic proximal colectomy 07/03/19 for intermittent volvulus  Outpatient Encounter Medications as of 09/20/2019  Medication Sig  . Calcium Carbonate-Vitamin D3 (CALCIUM 600-D) 600-400 MG-UNIT TABS Take 2 tablets by mouth daily.  [name] Oil-Levomenthol (FDGARD PO) Take 1 capsule by mouth daily.  Marland Kitchen dexlansoprazole (DEXILANT) 60 MG capsule Take 60 mg by mouth daily.  Marland Kitchen estradiol (ESTRACE) 1 MG tablet Take 1 mg by mouth at bedtime.   Marland Kitchen ibuprofen (ADVIL) 200 MG tablet Take 400 mg by mouth every 6 (six) hours as needed for headache or moderate pain.  Marland Kitchen  linaclotide (LINZESS) 290 MCG CAPS capsule Take 290 mcg by mouth as needed.   . polyethylene glycol (MIRALAX / GLYCOLAX) 17 g packet Take 17 g by mouth daily.  Marland Kitchen PRESCRIPTION MEDICATION Take 1 capsule by mouth daily before breakfast. Ultra Flora Probiotic  . prochlorperazine (COMPAZINE) 10 MG tablet Take 1 tablet (10 mg total) by mouth every 8 (eight) hours as needed for refractory nausea / vomiting (Use for nausea and / or vomiting unresolved with ondansetron (Zofran)).  . progesterone (PROMETRIUM) 100 MG capsule 1 capsule daily.  . [DISCONTINUED] progesterone (PROMETRIUM) 200 MG capsule Take 200 mg by mouth at bedtime.   No facility-administered encounter medications on file as of 09/20/2019.     Allergies as of 09/20/2019 - Review Complete 09/20/2019  Allergen Reaction Noted  . Oxycodone  06/04/2019  . Claritin-d [loratadine-pseudoephedrine er] Rash 10/27/2011  . Codeine Rash 10/27/2011    Past Medical History:  Diagnosis Date  . Abdominal pain   . Arthritis    difficulty walking  . Atypical chest pain 11/11/2016  . Cholesteatoma of right ear   . Chronic headaches   . 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HCC) 11/11/2016  . Cyst    back  . Hearing aid worn   . History of ITP   . Hyperlipidemia 11/11/2016  . Large ovary 07/09/2019  . PONV (postoperative nausea and vomiting)   . Reflux     Past Surgical History:  Procedure Laterality Date  . CHOLECYSTECTOMY  2010  . ESOPHAGOGASTRODUODENOSCOPY (EGD) WITH PROPOFOL N/A 06/12/2019  Procedure: ESOPHAGOGASTRODUODENOSCOPY (EGD) WITH PROPOFOL;  Surgeon: Carol Ada, MD;  Location: [facility];  Service: Endoscopy;  Laterality: N/A;  . EXTERNAL EAR SURGERY  2009/2010  . EXTERNAL EAR SURGERY  2010   right ear  . KNEE ARTHROSCOPY     right  . KNEE LIGAMENT RECONSTRUCTION     left  . MASTOIDECTOMY Right 01/19/2017   Procedure: RIGHT MODIFIED RADICAL MASTOIDECTOMY;  Surgeon: Vicie Mutters, MD;  Location: [facility];  Service: ENT;  Laterality: Right;  . UPPER ESOPHAGEAL ENDOSCOPIC ULTRASOUND (EUS) N/A 06/12/2019   Procedure: UPPER ESOPHAGEAL ENDOSCOPIC ULTRASOUND (EUS);  Surgeon: Carol Ada, MD;  Location: [facility];  Service: Endoscopy;  Laterality: N/A;  . WISDOM TOOTH EXTRACTION      Family History  Problem Relation Age of Onset  . Diabetes Mother   . Asthma Mother   . Diabetes type II Mother   . Alcohol abuse Mother   . Hypertension Mother   . Diabetes Father   . Hypertension Father   . Kidney disease Father   . Diabetes type II Father   . Alcohol abuse Father   . Crohn's disease Sister   . Cerebral palsy Brother   . COPD Sister   . Thrombocytopenia Sister     Social History   Socioeconomic History  . Marital status: Single    Spouse name: Not on file  . Number of children: Not on file  . Years of education: Not on file  . Highest education level: Not on file  Occupational History  . Not on file  Social Needs  . Financial resource strain: Not on file  . Food insecurity    Worry: Not on file    Inability: Not on file  . Transportation needs    Medical: Not on file    Non-medical: Not on file  Tobacco Use  . Smoking status: Never Smoker  . Smokeless tobacco: Never Used  Substance and Sexual Activity  . Alcohol use: Yes    Comment: occas  . Drug use: No  . Sexual activity: Not on file  Lifestyle  . Physical activity    Days per week: Not on file    Minutes per session: Not on file  . Stress: Not on file  Relationships  . Social Herbalist on phone: Not on file    Gets together: Not on file    Attends religious service: Not on file    Active member of club or organization: Not on file    Attends meetings of clubs or organizations: Not on file    Relationship status: Not on file  . Intimate partner violence    Fear of current or ex partner: Not on file    Emotionally abused: Not on file    Physically abused: Not on file    Forced sexual  activity: Not on file  Other Topics Concern  . Not on file  Social History Narrative  . Not on file      Review of systems: Review of Systems  Constitutional: Negative for fever and chills.  HENT: Positive for ringing in ears Eyes: Negative for blurred vision.  Respiratory: Negative for cough, shortness of breath and wheezing.   Cardiovascular: Negative for chest pain and palpitations.  Gastrointestinal: as per HPI Genitourinary: Negative for dysuria, urgency, frequency and hematuria.  Musculoskeletal: Negative for myalgias, back pain and joint pain.  Skin: Negative for itching and rash.  Neurological: Negative for dizziness, tremors, focal  weakness, seizures and loss of consciousness.  Endo/Heme/Allergies: Positive for seasonal allergies.  Psychiatric/Behavioral: Negative for depression, suicidal ideas and hallucinations. Positive for insomnia All other systems reviewed and are negative.   Physical Exam: Vitals:   09/20/19 0919  BP: 112/76  Pulse: 62  Temp: 97.6 F (36.4 C)   Body mass index is 24.7 kg/m. Gen:      No acute distress HEENT:  EOMI, sclera anicteric Neck:     No masses; no thyromegaly Lungs:    Clear to auscultation bilaterally; normal respiratory effort CV:         Regular rate and rhythm; no murmurs Abd:      + bowel sounds; soft, non-tender; no palpable masses, no distension Ext:    No edema; adequate peripheral perfusion Skin:      Warm and dry; no rash Neuro: alert and oriented x 3 Psych: normal mood and affect  Data Reviewed:  Reviewed labs, radiology imaging, old records and pertinent past GI work up   Assessment and Plan/Recommendations:  27 yr very pleasant F with h/o chronic constipation, s/p proximal  colectomy for ?chronic intermittent cecal volvulus.   Continue Miralax 1 capful daily as needed Benefiber 1 teaspoon BID with meals Increase water intake to 8-10 cups daily  Trial IB gard for IBS symptoms and intermittent abd bloating  and cramping Maintain IBS food symptom dairy  Dyspepsia: Continue FD Gard as needed TID Will check stool H.pylori Ag off PPI and H2 blocker for 10 days and stool Giardia  GERD: well controlled Will try to taper down PPI, switch to Omeprazole once daily DC Dexilant Continue antireflux measures and lifestyle modifications  Return in 3 months or sooner if needed.   25 minutes was spent face-to-face with the patient. Greater than 50% of the time used for counseling as well as treatment plan and follow-up. She had multiple questions which were answered to her satisfaction  K. Denzil Magnuson , MD    CC: Ronita Hipps, MD

## 2019-09-21 ENCOUNTER — Encounter: Payer: Self-pay | Admitting: Gastroenterology

## 2019-09-28 DIAGNOSIS — H6983 Other specified disorders of Eustachian tube, bilateral: Secondary | ICD-10-CM | POA: Insufficient documentation

## 2019-09-28 DIAGNOSIS — H6993 Unspecified Eustachian tube disorder, bilateral: Secondary | ICD-10-CM | POA: Diagnosis not present

## 2019-09-28 DIAGNOSIS — K56609 Unspecified intestinal obstruction, unspecified as to partial versus complete obstruction: Secondary | ICD-10-CM | POA: Insufficient documentation

## 2019-09-28 DIAGNOSIS — H7292 Unspecified perforation of tympanic membrane, left ear: Secondary | ICD-10-CM | POA: Diagnosis not present

## 2019-09-28 DIAGNOSIS — H7202 Central perforation of tympanic membrane, left ear: Secondary | ICD-10-CM | POA: Insufficient documentation

## 2019-09-28 DIAGNOSIS — D693 Immune thrombocytopenic purpura: Secondary | ICD-10-CM | POA: Insufficient documentation

## 2019-09-28 DIAGNOSIS — H7192 Unspecified cholesteatoma, left ear: Secondary | ICD-10-CM | POA: Insufficient documentation

## 2019-09-28 DIAGNOSIS — H95191 Other disorders following mastoidectomy, right ear: Secondary | ICD-10-CM | POA: Diagnosis not present

## 2019-09-28 DIAGNOSIS — H906 Mixed conductive and sensorineural hearing loss, bilateral: Secondary | ICD-10-CM | POA: Diagnosis not present

## 2019-09-28 DIAGNOSIS — H9313 Tinnitus, bilateral: Secondary | ICD-10-CM | POA: Insufficient documentation

## 2019-09-28 DIAGNOSIS — H7121 Cholesteatoma of mastoid, right ear: Secondary | ICD-10-CM | POA: Diagnosis not present

## 2019-10-02 DIAGNOSIS — H7292 Unspecified perforation of tympanic membrane, left ear: Secondary | ICD-10-CM | POA: Diagnosis not present

## 2019-10-05 DIAGNOSIS — N838 Other noninflammatory disorders of ovary, fallopian tube and broad ligament: Secondary | ICD-10-CM | POA: Diagnosis not present

## 2019-10-05 DIAGNOSIS — N950 Postmenopausal bleeding: Secondary | ICD-10-CM | POA: Diagnosis not present

## 2019-10-05 DIAGNOSIS — N939 Abnormal uterine and vaginal bleeding, unspecified: Secondary | ICD-10-CM | POA: Diagnosis not present

## 2019-10-10 DIAGNOSIS — H524 Presbyopia: Secondary | ICD-10-CM | POA: Diagnosis not present

## 2019-11-02 ENCOUNTER — Other Ambulatory Visit: Payer: Self-pay | Admitting: Surgery

## 2019-11-02 DIAGNOSIS — R109 Unspecified abdominal pain: Secondary | ICD-10-CM

## 2019-11-05 ENCOUNTER — Telehealth: Payer: Self-pay | Admitting: Gastroenterology

## 2019-11-05 NOTE — Telephone Encounter (Signed)
Pt states that she has been having stomach spam since last Monday, they were very bad that she missed work on Tuesday. She would like some advise.

## 2019-11-05 NOTE — Telephone Encounter (Signed)
Doctor of the day Patient of Dr Silverio Decamp. Last seen 09/20/19.  Calls with complaints of upper to mid abdominal pain she describes as "knotted up." and "sharp at times." She has treated this with ibuprofen with relief.  She had a recent spell of pain was unrelieved by ibuprofen and unrelieved by "Oxy" she had left from her partial colectomy in July. She reached out to Dr Johney Maine (sugeon). He had her increase the Miralax to BID, go on a liquid diet and advance every 24 hours (puree, soft, then regular) and has a CT ordered for 11/14/19.  Patient added Gaviscon to her PRN medications without relief. States she has her belching back. Her abdominal pain is still present making her feel like her stomach is knotted.  I made her an appointment with Dr Silverio Decamp 11/26/19. She wants to try GI cocktail. Please advise.

## 2019-11-06 NOTE — Telephone Encounter (Signed)
Lets see what CT shows on 11/14/2019. I would go with Dr. Clyda Greener recommendations at this time.  Increased MiraLAX, liquid diet and then advance gradually. Hold off on GI cocktail for now  Please call her tomorrow and see how she is doing.  Thx  RG

## 2019-11-07 DIAGNOSIS — L814 Other melanin hyperpigmentation: Secondary | ICD-10-CM | POA: Diagnosis not present

## 2019-11-07 DIAGNOSIS — L821 Other seborrheic keratosis: Secondary | ICD-10-CM | POA: Diagnosis not present

## 2019-11-07 DIAGNOSIS — Z85828 Personal history of other malignant neoplasm of skin: Secondary | ICD-10-CM | POA: Diagnosis not present

## 2019-11-07 DIAGNOSIS — R202 Paresthesia of skin: Secondary | ICD-10-CM | POA: Diagnosis not present

## 2019-11-07 DIAGNOSIS — D225 Melanocytic nevi of trunk: Secondary | ICD-10-CM | POA: Diagnosis not present

## 2019-11-07 DIAGNOSIS — Z23 Encounter for immunization: Secondary | ICD-10-CM | POA: Diagnosis not present

## 2019-11-07 DIAGNOSIS — L570 Actinic keratosis: Secondary | ICD-10-CM | POA: Diagnosis not present

## 2019-11-07 NOTE — Telephone Encounter (Signed)
Left message with this information

## 2019-11-14 ENCOUNTER — Ambulatory Visit
Admission: RE | Admit: 2019-11-14 | Discharge: 2019-11-14 | Disposition: A | Discharge status: Home / Self Care | Payer: 59 | Source: Ambulatory Visit | Attending: Surgery | Admitting: Surgery

## 2019-11-14 ENCOUNTER — Other Ambulatory Visit: Payer: Self-pay

## 2019-11-14 DIAGNOSIS — R1011 Right upper quadrant pain: Secondary | ICD-10-CM | POA: Diagnosis not present

## 2019-11-14 DIAGNOSIS — R109 Unspecified abdominal pain: Secondary | ICD-10-CM

## 2019-11-14 MED ORDER — IOPAMIDOL (ISOVUE-300) INJECTION 61%
100.0000 mL | Freq: Once | INTRAVENOUS | Status: AC | PRN
  Administered 2019-11-14: 100 mL via INTRAVENOUS

## 2019-11-22 DIAGNOSIS — H6993 Unspecified Eustachian tube disorder, bilateral: Secondary | ICD-10-CM | POA: Diagnosis not present

## 2019-11-22 DIAGNOSIS — H9313 Tinnitus, bilateral: Secondary | ICD-10-CM | POA: Diagnosis not present

## 2019-11-22 DIAGNOSIS — H906 Mixed conductive and sensorineural hearing loss, bilateral: Secondary | ICD-10-CM | POA: Diagnosis not present

## 2019-11-22 DIAGNOSIS — H7292 Unspecified perforation of tympanic membrane, left ear: Secondary | ICD-10-CM | POA: Diagnosis not present

## 2019-11-22 DIAGNOSIS — H9202 Otalgia, left ear: Secondary | ICD-10-CM | POA: Diagnosis not present

## 2019-11-22 DIAGNOSIS — H7192 Unspecified cholesteatoma, left ear: Secondary | ICD-10-CM | POA: Diagnosis not present

## 2019-11-22 MED FILL — CIPROFLOXACIN-DEXAMETHASONE: 0.3-0.1 | 10 days supply | Qty: 8 | Fill #0

## 2019-11-26 ENCOUNTER — Encounter: Payer: Self-pay | Admitting: Gastroenterology

## 2019-11-26 ENCOUNTER — Ambulatory Visit (INDEPENDENT_AMBULATORY_CARE_PROVIDER_SITE_OTHER): Payer: 59 | Admitting: Gastroenterology

## 2019-11-26 VITALS — BP 110/70 | HR 64 | Temp 97.6°F | Ht 62.0 in | Wt 139.1 lb

## 2019-11-26 DIAGNOSIS — R109 Unspecified abdominal pain: Secondary | ICD-10-CM | POA: Diagnosis not present

## 2019-11-26 DIAGNOSIS — R1013 Epigastric pain: Secondary | ICD-10-CM

## 2019-11-26 DIAGNOSIS — K589 Irritable bowel syndrome without diarrhea: Secondary | ICD-10-CM

## 2019-11-26 DIAGNOSIS — K219 Gastro-esophageal reflux disease without esophagitis: Secondary | ICD-10-CM

## 2019-11-26 MED ORDER — HYOSCYAMINE SULFATE SL 0.125 MG SL SUBL: SUBLINGUAL_TABLET | SUBLINGUAL | 1 refills | Status: DC

## 2019-11-26 MED ORDER — GI COCKTAIL ~~LOC~~: ORAL | 1 refills | Status: DC

## 2019-11-26 MED FILL — OSCIMIN SL 0.125 MG TABLET: 0.125 | 60 days supply | Qty: 120 | Fill #0

## 2019-11-26 NOTE — Patient Instructions (Addendum)
We will send Levsin and GI Cocktail to your pharmacy  Continue Omeprazole  Take IBGard or FDGard as needed, but  Not both at the same time  Follow up in 3 months   If you are age 57 or older, your body mass index should be between 23-30. Your Body mass index is 25.45 kg/m. If this is out of the aforementioned range listed, please consider follow up with your Primary Care Provider.  If you are age 30 or younger, your body mass index should be between 19-25. Your Body mass index is 25.45 kg/m. If this is out of the aformentioned range listed, please consider follow up with your Primary Care Provider.    I appreciate the  opportunity to care for you  Thank You   Harl Bowie , MD

## 2019-11-26 NOTE — Progress Notes (Signed)
Christine Brady    DK:[id number]    22-Aug-1961  Primary Care Physician:Holt, Gara Kroner, MD  Referring Physician: Ronita Hipps, MD [place] [id number],    Chief complaint: Abdominal cramping  HPI: 58 year old female with history of chronic constipation s/p proximal colectomy for intermittent cecal volvulus here for follow-up visit. Patient complains of severe episodes of abdominal spasm, she had 2 episodes in the past few weeks Prior to one of the severe episodes, earlier in the week she had intermittent stabbing, She tried Ibuprofen, pain and discomfort somewhat improved.  She developed similar severe abdominal spasm next day and Ibuprofen didn't help. She took some left over oxycodone , didn't help either. She started throwing up and was considering to coming to the ER. Had an episode of similar severe abdominal spasm few years ago after cholecystectomy, improved when she got GI cocktail  She is having 1-2 bowel movements daily, denies any constipation.  No longer taking Linzess.  Denies any melena or rectal bleeding.  Relevant GI Hx:  EGD 05/21/2019: Normal esophagus. Fundic gland polyps removed.  Colonoscopy Normal, due for recall colonoscopy in 2025  EGD with EUS 06/12/19: Normal EGD. Mild dilation CBD 75mm s/p cholecystectomy.Normal pancreas.  Robotic proximal colectomy 07/03/19 for intermittent volvulus   Outpatient Encounter Medications as of 11/26/2019  Medication Sig  . Calcium Carbonate-Vitamin D3 (CALCIUM 600-D) 600-400 MG-UNIT TABS Take 2 tablets by mouth daily.  [name] Oil-Levomenthol (FDGARD PO) Take 1 capsule by mouth daily.  Marland Kitchen estradiol (ESTRACE) 1 MG tablet Take 1 mg by mouth at bedtime.   Marland Kitchen ibuprofen (ADVIL) 200 MG tablet Take 400 mg by mouth every 6 (six) hours as needed for headache or moderate pain.  . Lactobacillus Rhamnosus, GG, (RA PROBIOTIC DIGESTIVE CARE) CAPS Take 1 tablet by mouth daily.  Marland Kitchen linaclotide (LINZESS)  290 MCG CAPS capsule Take 290 mcg by mouth as needed.   Marland Kitchen omeprazole (PRILOSEC) 40 MG capsule Take 1 capsule (40 mg total) by mouth daily.  Marland Kitchen oxycodone (OXY-IR) 5 MG capsule Take 5 mg by mouth as needed.  . polyethylene glycol (MIRALAX / GLYCOLAX) 17 g packet Take 17 g by mouth daily.  Marland Kitchen PRESCRIPTION MEDICATION Take 1 capsule by mouth daily before breakfast. Ultra Flora Probiotic  . prochlorperazine (COMPAZINE) 10 MG tablet Take 1 tablet (10 mg total) by mouth every 8 (eight) hours as needed for refractory nausea / vomiting (Use for nausea and / or vomiting unresolved with ondansetron (Zofran)).  . progesterone (PROMETRIUM) 100 MG capsule 1 capsule daily.  Marland Kitchen HYDROcodone-acetaminophen (NORCO/VICODIN) 5-325 MG tablet Take 1 tablet by mouth daily.  . [DISCONTINUED] dexlansoprazole (DEXILANT) 60 MG capsule Take 60 mg by mouth daily.   No facility-administered encounter medications on file as of 11/26/2019.    Allergies as of 11/26/2019 - Review Complete 11/26/2019  Allergen Reaction Noted  . Oxycodone  06/04/2019  . Claritin-d [loratadine-pseudoephedrine er] Rash 10/27/2011  . Codeine Rash 10/27/2011    Past Medical History:  Diagnosis Date  . Abdominal pain   . Arthritis    difficulty walking  . Atypical chest pain 11/11/2016  . Cholesteatoma of right ear   . Chronic headaches   . 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HCC) 11/11/2016  . Cyst    back  . Hearing aid worn   . History of ITP   . Hyperlipidemia 11/11/2016  . Large ovary 07/09/2019  . PONV (postoperative nausea and vomiting)   .  Reflux     Past Surgical History:  Procedure Laterality Date  . CHOLECYSTECTOMY  2010  . ESOPHAGOGASTRODUODENOSCOPY (EGD) WITH PROPOFOL N/A 06/12/2019   Procedure: ESOPHAGOGASTRODUODENOSCOPY (EGD) WITH PROPOFOL;  Surgeon: Carol Ada, MD;  Location: [facility];  Service: Endoscopy;  Laterality: N/A;  . EXTERNAL EAR SURGERY  2009/2010  . EXTERNAL EAR SURGERY  2010   right ear  .  KNEE ARTHROSCOPY     right  . KNEE LIGAMENT RECONSTRUCTION     left  . MASTOIDECTOMY Right 01/19/2017   Procedure: RIGHT MODIFIED RADICAL MASTOIDECTOMY;  Surgeon: Vicie Mutters, MD;  Location: [facility];  Service: ENT;  Laterality: Right;  . UPPER ESOPHAGEAL ENDOSCOPIC ULTRASOUND (EUS) N/A 06/12/2019   Procedure: UPPER ESOPHAGEAL ENDOSCOPIC ULTRASOUND (EUS);  Surgeon: Carol Ada, MD;  Location: [facility];  Service: Endoscopy;  Laterality: N/A;  . WISDOM TOOTH EXTRACTION      Family History  Problem Relation Age of Onset  . Diabetes Mother   . Asthma Mother   . Diabetes type II Mother   . Alcohol abuse Mother   . Hypertension Mother   . Diabetes Father   . Hypertension Father   . Kidney disease Father   . Diabetes type II Father   . Alcohol abuse Father   . Crohn's disease Sister   . Cerebral palsy Brother   . COPD Sister   . Thrombocytopenia Sister     Social History   Socioeconomic History  . Marital status: Single    Spouse name: Not on file  . Number of children: Not on file  . Years of education: Not on file  . Highest education level: Not on file  Occupational History  . Not on file  Tobacco Use  . Smoking status: Never Smoker  . Smokeless tobacco: Never Used  Substance and Sexual Activity  . Alcohol use: Yes    Comment: occas  . Drug use: No  . Sexual activity: Not on file  Other Topics Concern  . Not on file  Social History Narrative  . Not on file   Social Determinants of Health   Financial Resource Strain:   . Difficulty of Paying Living Expenses: Not on file  Food Insecurity:   . Worried About Charity fundraiser in the Last Year: Not on file  . Ran Out of Food in the Last Year: Not on file  Transportation Needs:   . Lack of Transportation (Medical): Not on file  . Lack of Transportation (Non-Medical): Not on file  Physical Activity:   . Days of Exercise per Week: Not on file  . Minutes of Exercise per Session: Not on file    Stress:   . Feeling of Stress : Not on file  Social Connections:   . Frequency of Communication with Friends and Family: Not on file  . Frequency of Social Gatherings with Friends and Family: Not on file  . Attends Religious Services: Not on file  . Active Member of Clubs or Organizations: Not on file  . Attends Archivist Meetings: Not on file  . Marital Status: Not on file  Intimate Partner Violence:   . Fear of Current or Ex-Partner: Not on file  . Emotionally Abused: Not on file  . Physically Abused: Not on file  . Sexually Abused: Not on file      Review of systems: Review of Systems  Constitutional: Negative for fever and chills.  HENT: Negative.   Eyes: Negative for blurred vision.  Respiratory: Negative for cough, shortness of breath and wheezing.   Cardiovascular: Negative for chest pain and palpitations.  Gastrointestinal: as per HPI Genitourinary: Negative for dysuria, urgency, frequency and hematuria.  Musculoskeletal: Negative for myalgias, back pain and joint pain.  Skin: Negative for itching and rash.  Neurological: Negative for dizziness, tremors, focal weakness, seizures and loss of consciousness.  Endo/Heme/Allergies: Positive for seasonal allergies.  Psychiatric/Behavioral: Negative for depression, suicidal ideas and hallucinations.  All other systems reviewed and are negative.   Physical Exam: Vitals:   11/26/19 1054  BP: 110/70  Pulse: 64  Temp: 97.6 F (36.4 C)   Body mass index is 25.45 kg/m. Gen:      No acute distress HEENT:  EOMI, sclera anicteric Neck:     No masses; no thyromegaly Lungs:    Clear to auscultation bilaterally; normal respiratory effort CV:         Regular rate and rhythm; no murmurs Abd:      + bowel sounds; soft, non-tender; no palpable masses, no distension Ext:    No edema; adequate peripheral perfusion Skin:      Warm and dry; no rash Neuro: alert and oriented x 3 Psych: normal mood and affect  Data  Reviewed:  Reviewed labs, radiology imaging, old records and pertinent past GI work up   Assessment and Plan/Recommendations:  58 year old female with history of IBS, constipation status post proximal colectomy for chronic intermittent cecal volvulus with episodes of severe abdominal spasm Levsin sublingual 1 tablet twice daily as needed We will also send prescription for GI cocktail, advised patient to use GI cocktail or sublingual Levsin and avoid taking both to get her at the same time  IBS: Continue IBgard up to 3 times daily as needed  GERD: Continue omeprazole and antireflux measures  Dyspepsia: Okay to use FD guard as needed Patient was unable to hold PPI or H2 blocker for 10 days and has not done H. pylori stool antigen  Return in 3 months or sooner if needed 25 minutes was spent face-to-face with the patient. Greater than 50% of the time used for counseling as well as treatment plan and follow-up. She had multiple questions which were answered to her satisfaction  K. Denzil Magnuson , MD    CC: Ronita Hipps, MD

## 2019-11-27 ENCOUNTER — Encounter: Payer: Self-pay | Admitting: Gastroenterology

## 2019-11-27 MED FILL — ESTRADIOL 1 MG TABS: 1 | 90 days supply | Qty: 90 | Fill #0

## 2019-11-27 MED FILL — PROGESTERONE 100 MG CAPSULE: 100 | 90 days supply | Qty: 90 | Fill #1

## 2020-01-03 DIAGNOSIS — H7192 Unspecified cholesteatoma, left ear: Secondary | ICD-10-CM | POA: Diagnosis not present

## 2020-01-03 DIAGNOSIS — H6993 Unspecified Eustachian tube disorder, bilateral: Secondary | ICD-10-CM | POA: Diagnosis not present

## 2020-01-03 DIAGNOSIS — H7202 Central perforation of tympanic membrane, left ear: Secondary | ICD-10-CM | POA: Diagnosis not present

## 2020-01-03 DIAGNOSIS — H9202 Otalgia, left ear: Secondary | ICD-10-CM | POA: Diagnosis not present

## 2020-01-03 DIAGNOSIS — H9313 Tinnitus, bilateral: Secondary | ICD-10-CM | POA: Diagnosis not present

## 2020-01-03 DIAGNOSIS — H906 Mixed conductive and sensorineural hearing loss, bilateral: Secondary | ICD-10-CM | POA: Diagnosis not present

## 2020-01-07 ENCOUNTER — Telehealth: Payer: Self-pay | Admitting: Gastroenterology

## 2020-01-07 NOTE — Telephone Encounter (Signed)
Patient had mentioned previously that she had good response to GI cocktail in the past.  Please send new refill for it and encourage her to take it as needed up to 3 times daily.  Thanks

## 2020-01-07 NOTE — Telephone Encounter (Signed)
Called patient back. No answer. Left a message on her voicemail.

## 2020-01-07 NOTE — Telephone Encounter (Signed)
Patient calling with complaints of "severe" abdominal pain that is predominately in her right side below her rib cage. Pain is constant but  worsened if she eats. She has had partial relief with Levsin. She is afraid to take Levsin at work due to the potential of drowsiness. States she has not yet experienced drowsiness when she has taken it. She is "thinking about taking an "Oxy"." She last took Levsin last night. She denies constipation and is drinking Miralax daily. The spell of pain started 4 days ago. She did not fill her Rx for the GI cocktail due to cost and short stability of the compound. Please advise.

## 2020-01-09 NOTE — Telephone Encounter (Signed)
Patient has not called back. Closing this encounter.

## 2020-02-12 DIAGNOSIS — H7192 Unspecified cholesteatoma, left ear: Secondary | ICD-10-CM | POA: Diagnosis not present

## 2020-02-12 DIAGNOSIS — Z791 Long term (current) use of non-steroidal anti-inflammatories (NSAID): Secondary | ICD-10-CM | POA: Diagnosis not present

## 2020-02-12 DIAGNOSIS — H7202 Central perforation of tympanic membrane, left ear: Secondary | ICD-10-CM | POA: Diagnosis not present

## 2020-02-12 DIAGNOSIS — H9072 Mixed conductive and sensorineural hearing loss, unilateral, left ear, with unrestricted hearing on the contralateral side: Secondary | ICD-10-CM | POA: Diagnosis not present

## 2020-02-12 DIAGNOSIS — D693 Immune thrombocytopenic purpura: Secondary | ICD-10-CM | POA: Diagnosis not present

## 2020-02-12 DIAGNOSIS — Z7901 Long term (current) use of anticoagulants: Secondary | ICD-10-CM | POA: Diagnosis not present

## 2020-02-12 DIAGNOSIS — K219 Gastro-esophageal reflux disease without esophagitis: Secondary | ICD-10-CM | POA: Diagnosis not present

## 2020-02-12 DIAGNOSIS — Z79899 Other long term (current) drug therapy: Secondary | ICD-10-CM | POA: Diagnosis not present

## 2020-02-13 ENCOUNTER — Other Ambulatory Visit (HOSPITAL_COMMUNITY)
Admission: RE | Admit: 2020-02-13 | Discharge: 2020-02-13 | Disposition: A | Discharge status: Home / Self Care | Payer: 59 | Source: Ambulatory Visit | Attending: Otolaryngology | Admitting: Otolaryngology

## 2020-02-13 DIAGNOSIS — Z20822 Contact with and (suspected) exposure to covid-19: Secondary | ICD-10-CM | POA: Diagnosis not present

## 2020-02-13 DIAGNOSIS — H7422 Discontinuity and dislocation of left ear ossicles: Secondary | ICD-10-CM | POA: Insufficient documentation

## 2020-02-13 DIAGNOSIS — H7202 Central perforation of tympanic membrane, left ear: Secondary | ICD-10-CM | POA: Insufficient documentation

## 2020-02-13 DIAGNOSIS — H7192 Unspecified cholesteatoma, left ear: Secondary | ICD-10-CM | POA: Diagnosis not present

## 2020-02-13 DIAGNOSIS — D693 Immune thrombocytopenic purpura: Secondary | ICD-10-CM | POA: Diagnosis not present

## 2020-02-13 DIAGNOSIS — H9072 Mixed conductive and sensorineural hearing loss, unilateral, left ear, with unrestricted hearing on the contralateral side: Secondary | ICD-10-CM | POA: Diagnosis not present

## 2020-02-13 LAB — RESPIRATORY PANEL BY RT PCR (FLU A&B, COVID)
Influenza A by PCR: NEGATIVE
Influenza B by PCR: NEGATIVE
SARS Coronavirus 2 by RT PCR: NEGATIVE

## 2020-02-18 MED FILL — diazePAM 5 MG TABS: 5 | 8 days supply | Qty: 30 | Fill #0

## 2020-02-19 DIAGNOSIS — H906 Mixed conductive and sensorineural hearing loss, bilateral: Secondary | ICD-10-CM | POA: Diagnosis not present

## 2020-02-19 DIAGNOSIS — H908 Mixed conductive and sensorineural hearing loss, unspecified: Secondary | ICD-10-CM | POA: Diagnosis not present

## 2020-02-19 DIAGNOSIS — K219 Gastro-esophageal reflux disease without esophagitis: Secondary | ICD-10-CM | POA: Diagnosis not present

## 2020-02-19 DIAGNOSIS — Z79899 Other long term (current) drug therapy: Secondary | ICD-10-CM | POA: Diagnosis not present

## 2020-02-19 DIAGNOSIS — H7422 Discontinuity and dislocation of left ear ossicles: Secondary | ICD-10-CM | POA: Diagnosis not present

## 2020-02-19 DIAGNOSIS — Z791 Long term (current) use of non-steroidal anti-inflammatories (NSAID): Secondary | ICD-10-CM | POA: Diagnosis not present

## 2020-02-19 DIAGNOSIS — H7202 Central perforation of tympanic membrane, left ear: Secondary | ICD-10-CM | POA: Diagnosis not present

## 2020-02-19 DIAGNOSIS — H9072 Mixed conductive and sensorineural hearing loss, unilateral, left ear, with unrestricted hearing on the contralateral side: Secondary | ICD-10-CM | POA: Diagnosis not present

## 2020-02-19 DIAGNOSIS — H9202 Otalgia, left ear: Secondary | ICD-10-CM | POA: Diagnosis not present

## 2020-02-19 DIAGNOSIS — D693 Immune thrombocytopenic purpura: Secondary | ICD-10-CM | POA: Diagnosis not present

## 2020-02-19 DIAGNOSIS — Z7901 Long term (current) use of anticoagulants: Secondary | ICD-10-CM | POA: Diagnosis not present

## 2020-02-19 DIAGNOSIS — H9212 Otorrhea, left ear: Secondary | ICD-10-CM | POA: Diagnosis not present

## 2020-02-19 DIAGNOSIS — H7112 Cholesteatoma of tympanum, left ear: Secondary | ICD-10-CM | POA: Diagnosis not present

## 2020-02-19 DIAGNOSIS — H7192 Unspecified cholesteatoma, left ear: Secondary | ICD-10-CM | POA: Diagnosis not present

## 2020-02-20 DIAGNOSIS — H9072 Mixed conductive and sensorineural hearing loss, unilateral, left ear, with unrestricted hearing on the contralateral side: Secondary | ICD-10-CM | POA: Diagnosis not present

## 2020-02-20 DIAGNOSIS — H7192 Unspecified cholesteatoma, left ear: Secondary | ICD-10-CM | POA: Diagnosis not present

## 2020-02-20 DIAGNOSIS — Z7901 Long term (current) use of anticoagulants: Secondary | ICD-10-CM | POA: Diagnosis not present

## 2020-02-20 DIAGNOSIS — D693 Immune thrombocytopenic purpura: Secondary | ICD-10-CM | POA: Diagnosis not present

## 2020-02-20 DIAGNOSIS — H7202 Central perforation of tympanic membrane, left ear: Secondary | ICD-10-CM | POA: Diagnosis not present

## 2020-02-20 DIAGNOSIS — K219 Gastro-esophageal reflux disease without esophagitis: Secondary | ICD-10-CM | POA: Diagnosis not present

## 2020-02-20 DIAGNOSIS — Z791 Long term (current) use of non-steroidal anti-inflammatories (NSAID): Secondary | ICD-10-CM | POA: Diagnosis not present

## 2020-02-20 DIAGNOSIS — Z79899 Other long term (current) drug therapy: Secondary | ICD-10-CM | POA: Diagnosis not present

## 2020-02-20 MED FILL — CEPHALEXIN 500 MG CAPSULE: 500 | 7 days supply | Qty: 21 | Fill #0

## 2020-02-20 MED FILL — CIPROFLOXACIN-DEXAMETHASONE: 0.3-0.1 | 17 days supply | Qty: 8 | Fill #0

## 2020-02-20 MED FILL — HYDROCODON-APAP 5-325: 5-325 | 2 days supply | Qty: 15 | Fill #0

## 2020-02-29 DIAGNOSIS — Z09 Encounter for follow-up examination after completed treatment for conditions other than malignant neoplasm: Secondary | ICD-10-CM | POA: Insufficient documentation

## 2020-02-29 MED FILL — MECLIZINE 25 MG TABLET: 25 | 7 days supply | Qty: 20 | Fill #0

## 2020-02-29 MED FILL — ONDANSETRON HCL 4 MG TABLET: 4 | 6 days supply | Qty: 20 | Fill #0

## 2020-03-06 MED FILL — PROGESTERONE MICRONIZED 100: 100 | 90 days supply | Qty: 90 | Fill #2

## 2020-03-06 MED FILL — ESTRADIOL 1 MG TABS: 1 | 90 days supply | Qty: 90 | Fill #1

## 2020-04-10 DIAGNOSIS — H6993 Unspecified Eustachian tube disorder, bilateral: Secondary | ICD-10-CM | POA: Diagnosis not present

## 2020-04-10 DIAGNOSIS — Z9089 Acquired absence of other organs: Secondary | ICD-10-CM | POA: Insufficient documentation

## 2020-04-10 DIAGNOSIS — H900 Conductive hearing loss, bilateral: Secondary | ICD-10-CM | POA: Diagnosis not present

## 2020-04-10 DIAGNOSIS — Z48810 Encounter for surgical aftercare following surgery on the sense organs: Secondary | ICD-10-CM | POA: Diagnosis not present

## 2020-04-10 DIAGNOSIS — D693 Immune thrombocytopenic purpura: Secondary | ICD-10-CM | POA: Diagnosis not present

## 2020-04-22 DIAGNOSIS — R7309 Other abnormal glucose: Secondary | ICD-10-CM | POA: Diagnosis not present

## 2020-04-22 DIAGNOSIS — Z6824 Body mass index (BMI) 24.0-24.9, adult: Secondary | ICD-10-CM | POA: Diagnosis not present

## 2020-04-22 DIAGNOSIS — Z9049 Acquired absence of other specified parts of digestive tract: Secondary | ICD-10-CM | POA: Diagnosis not present

## 2020-04-22 DIAGNOSIS — Z0000 Encounter for general adult medical examination without abnormal findings: Secondary | ICD-10-CM | POA: Diagnosis not present

## 2020-04-23 DIAGNOSIS — E538 Deficiency of other specified B group vitamins: Secondary | ICD-10-CM | POA: Diagnosis not present

## 2020-04-30 DIAGNOSIS — E538 Deficiency of other specified B group vitamins: Secondary | ICD-10-CM | POA: Diagnosis not present

## 2020-05-07 DIAGNOSIS — S0502XA Injury of conjunctiva and corneal abrasion without foreign body, left eye, initial encounter: Secondary | ICD-10-CM | POA: Diagnosis not present

## 2020-05-07 DIAGNOSIS — E538 Deficiency of other specified B group vitamins: Secondary | ICD-10-CM | POA: Diagnosis not present

## 2020-05-14 DIAGNOSIS — E538 Deficiency of other specified B group vitamins: Secondary | ICD-10-CM | POA: Diagnosis not present

## 2020-05-29 MED FILL — ESTRADIOL 1 MG TABS: 1 | 90 days supply | Qty: 90 | Fill #2

## 2020-05-29 MED FILL — OMEPRAZOLE 40 MG CPDR: 40 | 90 days supply | Qty: 90 | Fill #1

## 2020-05-29 MED FILL — PROGESTERONE 100 MG CAPS: 100 | 90 days supply | Qty: 90 | Fill #3

## 2020-06-11 DIAGNOSIS — E538 Deficiency of other specified B group vitamins: Secondary | ICD-10-CM | POA: Diagnosis not present

## 2020-06-25 DIAGNOSIS — D485 Neoplasm of uncertain behavior of skin: Secondary | ICD-10-CM | POA: Diagnosis not present

## 2020-06-25 DIAGNOSIS — L570 Actinic keratosis: Secondary | ICD-10-CM | POA: Diagnosis not present

## 2020-06-25 DIAGNOSIS — L814 Other melanin hyperpigmentation: Secondary | ICD-10-CM | POA: Diagnosis not present

## 2020-06-25 DIAGNOSIS — L821 Other seborrheic keratosis: Secondary | ICD-10-CM | POA: Diagnosis not present

## 2020-06-25 DIAGNOSIS — Z411 Encounter for cosmetic surgery: Secondary | ICD-10-CM | POA: Diagnosis not present

## 2020-06-25 MED FILL — FLUOROURACIL 5 % CREA: 5 | 21 days supply | Qty: 40 | Fill #0

## 2020-07-01 DIAGNOSIS — H90A11 Conductive hearing loss, unilateral, right ear with restricted hearing on the contralateral side: Secondary | ICD-10-CM | POA: Diagnosis not present

## 2020-07-01 DIAGNOSIS — H90A31 Mixed conductive and sensorineural hearing loss, unilateral, right ear with restricted hearing on the contralateral side: Secondary | ICD-10-CM | POA: Diagnosis not present

## 2020-07-01 DIAGNOSIS — H900 Conductive hearing loss, bilateral: Secondary | ICD-10-CM | POA: Diagnosis not present

## 2020-07-01 DIAGNOSIS — H906 Mixed conductive and sensorineural hearing loss, bilateral: Secondary | ICD-10-CM | POA: Diagnosis not present

## 2020-07-08 MED FILL — ROSUVASTATIN CALCIUM 5 MG T: 5 | 84 days supply | Qty: 24 | Fill #1

## 2020-07-10 DIAGNOSIS — H900 Conductive hearing loss, bilateral: Secondary | ICD-10-CM | POA: Diagnosis not present

## 2020-07-10 DIAGNOSIS — H6122 Impacted cerumen, left ear: Secondary | ICD-10-CM | POA: Diagnosis not present

## 2020-07-10 DIAGNOSIS — H9312 Tinnitus, left ear: Secondary | ICD-10-CM | POA: Diagnosis not present

## 2020-07-31 DIAGNOSIS — Z1231 Encounter for screening mammogram for malignant neoplasm of breast: Secondary | ICD-10-CM | POA: Diagnosis not present

## 2020-08-19 ENCOUNTER — Other Ambulatory Visit (HOSPITAL_COMMUNITY): Payer: Self-pay | Admitting: Obstetrics and Gynecology

## 2020-08-19 DIAGNOSIS — K562 Volvulus: Secondary | ICD-10-CM | POA: Diagnosis not present

## 2020-08-19 DIAGNOSIS — Z124 Encounter for screening for malignant neoplasm of cervix: Secondary | ICD-10-CM | POA: Diagnosis not present

## 2020-08-19 DIAGNOSIS — Z6824 Body mass index (BMI) 24.0-24.9, adult: Secondary | ICD-10-CM | POA: Diagnosis not present

## 2020-08-19 DIAGNOSIS — Z1231 Encounter for screening mammogram for malignant neoplasm of breast: Secondary | ICD-10-CM | POA: Diagnosis not present

## 2020-08-19 DIAGNOSIS — Z01419 Encounter for gynecological examination (general) (routine) without abnormal findings: Secondary | ICD-10-CM | POA: Diagnosis not present

## 2020-08-19 DIAGNOSIS — Z79890 Hormone replacement therapy (postmenopausal): Secondary | ICD-10-CM | POA: Diagnosis not present

## 2020-08-19 MED FILL — PROGESTERONE 200 MG CAPS: 200 | 90 days supply | Qty: 90 | Fill #0

## 2020-08-19 MED FILL — ESTRADIOL 1 MG TABS: 1 | 90 days supply | Qty: 90 | Fill #0

## 2020-08-21 DIAGNOSIS — H9313 Tinnitus, bilateral: Secondary | ICD-10-CM | POA: Diagnosis not present

## 2020-08-21 DIAGNOSIS — H906 Mixed conductive and sensorineural hearing loss, bilateral: Secondary | ICD-10-CM | POA: Diagnosis not present

## 2020-08-21 DIAGNOSIS — D693 Immune thrombocytopenic purpura: Secondary | ICD-10-CM | POA: Diagnosis not present

## 2020-08-21 DIAGNOSIS — H6993 Unspecified Eustachian tube disorder, bilateral: Secondary | ICD-10-CM | POA: Diagnosis not present

## 2020-08-21 DIAGNOSIS — Z48810 Encounter for surgical aftercare following surgery on the sense organs: Secondary | ICD-10-CM | POA: Diagnosis not present

## 2020-08-21 DIAGNOSIS — Z45321 Encounter for adjustment and management of cochlear device: Secondary | ICD-10-CM | POA: Diagnosis not present

## 2020-08-28 DIAGNOSIS — D2272 Melanocytic nevi of left lower limb, including hip: Secondary | ICD-10-CM | POA: Diagnosis not present

## 2020-08-28 DIAGNOSIS — L814 Other melanin hyperpigmentation: Secondary | ICD-10-CM | POA: Diagnosis not present

## 2020-08-28 DIAGNOSIS — L821 Other seborrheic keratosis: Secondary | ICD-10-CM | POA: Diagnosis not present

## 2020-08-28 DIAGNOSIS — Z85828 Personal history of other malignant neoplasm of skin: Secondary | ICD-10-CM | POA: Diagnosis not present

## 2020-08-28 DIAGNOSIS — D225 Melanocytic nevi of trunk: Secondary | ICD-10-CM | POA: Diagnosis not present

## 2020-08-28 DIAGNOSIS — L570 Actinic keratosis: Secondary | ICD-10-CM | POA: Diagnosis not present

## 2020-08-28 DIAGNOSIS — L578 Other skin changes due to chronic exposure to nonionizing radiation: Secondary | ICD-10-CM | POA: Diagnosis not present

## 2020-09-09 MED FILL — ESTRADIOL 1 MG TABS: 1 | 90 days supply | Qty: 90 | Fill #0

## 2020-09-10 ENCOUNTER — Other Ambulatory Visit (HOSPITAL_COMMUNITY): Payer: Self-pay | Admitting: Obstetrics and Gynecology

## 2020-09-10 MED FILL — PROGESTERONE 100 MG CAPS: 100 | 90 days supply | Qty: 90 | Fill #0

## 2020-09-15 MED FILL — OMEPRAZOLE 40 MG CPDR: 40 | 90 days supply | Qty: 90 | Fill #2

## 2020-10-02 ENCOUNTER — Other Ambulatory Visit (HOSPITAL_COMMUNITY): Payer: Self-pay | Admitting: Otolaryngology

## 2020-10-02 DIAGNOSIS — Z9889 Other specified postprocedural states: Secondary | ICD-10-CM | POA: Diagnosis not present

## 2020-10-02 DIAGNOSIS — H93222 Diplacusis, left ear: Secondary | ICD-10-CM | POA: Insufficient documentation

## 2020-10-02 DIAGNOSIS — H906 Mixed conductive and sensorineural hearing loss, bilateral: Secondary | ICD-10-CM | POA: Diagnosis not present

## 2020-10-02 DIAGNOSIS — Z9621 Cochlear implant status: Secondary | ICD-10-CM | POA: Diagnosis not present

## 2020-10-02 DIAGNOSIS — H9313 Tinnitus, bilateral: Secondary | ICD-10-CM | POA: Diagnosis not present

## 2020-10-02 MED FILL — GABAPENTIN 100 MG CAPSULE: 100 | 30 days supply | Qty: 30 | Fill #0

## 2020-10-06 ENCOUNTER — Other Ambulatory Visit (HOSPITAL_COMMUNITY): Payer: Self-pay

## 2020-10-06 MED FILL — ROSUVASTATIN CALCIUM 5 MG T: 5 | 84 days supply | Qty: 24 | Fill #0

## 2020-10-29 DIAGNOSIS — T85618A Breakdown (mechanical) of other specified internal prosthetic devices, implants and grafts, initial encounter: Secondary | ICD-10-CM | POA: Diagnosis not present

## 2020-10-29 DIAGNOSIS — Z9889 Other specified postprocedural states: Secondary | ICD-10-CM | POA: Diagnosis not present

## 2020-10-29 DIAGNOSIS — H908 Mixed conductive and sensorineural hearing loss, unspecified: Secondary | ICD-10-CM | POA: Diagnosis not present

## 2020-10-29 DIAGNOSIS — H7292 Unspecified perforation of tympanic membrane, left ear: Secondary | ICD-10-CM | POA: Diagnosis not present

## 2020-10-29 DIAGNOSIS — H93222 Diplacusis, left ear: Secondary | ICD-10-CM | POA: Diagnosis not present

## 2020-10-29 DIAGNOSIS — H73892 Other specified disorders of tympanic membrane, left ear: Secondary | ICD-10-CM | POA: Diagnosis not present

## 2020-10-29 DIAGNOSIS — Z8669 Personal history of other diseases of the nervous system and sense organs: Secondary | ICD-10-CM | POA: Diagnosis not present

## 2020-10-29 DIAGNOSIS — H9072 Mixed conductive and sensorineural hearing loss, unilateral, left ear, with unrestricted hearing on the contralateral side: Secondary | ICD-10-CM | POA: Diagnosis not present

## 2020-10-29 DIAGNOSIS — H6992 Unspecified Eustachian tube disorder, left ear: Secondary | ICD-10-CM | POA: Diagnosis not present

## 2020-10-29 DIAGNOSIS — H6983 Other specified disorders of Eustachian tube, bilateral: Secondary | ICD-10-CM | POA: Diagnosis not present

## 2020-12-08 MED FILL — ESTRADIOL 1 MG TABS: 1 | 90 days supply | Qty: 90 | Fill #1

## 2020-12-08 MED FILL — PROGESTERONE 100 MG CAPS: 100 | 90 days supply | Qty: 90 | Fill #1

## 2020-12-17 ENCOUNTER — Other Ambulatory Visit: Payer: Self-pay | Admitting: Gastroenterology

## 2020-12-17 MED FILL — OSCIMIN 0.125 MG SUBL: 0.125 | 60 days supply | Qty: 120 | Fill #0

## 2020-12-18 DIAGNOSIS — D693 Immune thrombocytopenic purpura: Secondary | ICD-10-CM | POA: Diagnosis not present

## 2020-12-18 DIAGNOSIS — Z9629 Presence of other otological and audiological implants: Secondary | ICD-10-CM | POA: Diagnosis not present

## 2020-12-18 DIAGNOSIS — H900 Conductive hearing loss, bilateral: Secondary | ICD-10-CM | POA: Diagnosis not present

## 2020-12-18 DIAGNOSIS — H6983 Other specified disorders of Eustachian tube, bilateral: Secondary | ICD-10-CM | POA: Diagnosis not present

## 2020-12-18 DIAGNOSIS — Z9889 Other specified postprocedural states: Secondary | ICD-10-CM | POA: Diagnosis not present

## 2021-01-08 ENCOUNTER — Other Ambulatory Visit (HOSPITAL_COMMUNITY): Payer: Self-pay | Admitting: Family Medicine

## 2021-01-08 MED FILL — ROSUVASTATIN CALCIUM 5 MG T: 5 | 84 days supply | Qty: 24 | Fill #0

## 2021-03-11 MED FILL — PROGESTERONE 100 MG CAPS: 100 | 90 days supply | Qty: 90 | Fill #2

## 2021-03-11 MED FILL — ESTRADIOL 1 MG TABS: 1 | 90 days supply | Qty: 90 | Fill #2

## 2021-04-06 ENCOUNTER — Other Ambulatory Visit: Payer: Self-pay | Admitting: Gastroenterology

## 2021-04-06 ENCOUNTER — Other Ambulatory Visit (HOSPITAL_COMMUNITY): Payer: Self-pay

## 2021-04-06 ENCOUNTER — Ambulatory Visit (INDEPENDENT_AMBULATORY_CARE_PROVIDER_SITE_OTHER): Payer: 59

## 2021-04-06 ENCOUNTER — Ambulatory Visit (INDEPENDENT_AMBULATORY_CARE_PROVIDER_SITE_OTHER): Payer: 59 | Admitting: Orthopaedic Surgery

## 2021-04-06 VITALS — Ht 62.0 in | Wt 138.0 lb

## 2021-04-06 DIAGNOSIS — G8929 Other chronic pain: Secondary | ICD-10-CM

## 2021-04-06 DIAGNOSIS — M25561 Pain in right knee: Secondary | ICD-10-CM

## 2021-04-06 DIAGNOSIS — M7062 Trochanteric bursitis, left hip: Secondary | ICD-10-CM | POA: Diagnosis not present

## 2021-04-06 DIAGNOSIS — M25552 Pain in left hip: Secondary | ICD-10-CM

## 2021-04-06 DIAGNOSIS — S83004S Unspecified dislocation of right patella, sequela: Secondary | ICD-10-CM | POA: Diagnosis not present

## 2021-04-06 MED ORDER — OMEPRAZOLE 40 MG PO CPDR
40.0000 mg | DELAYED_RELEASE_CAPSULE | Freq: Every day | ORAL | 3 refills | Status: DC
  Filled 2021-04-06: qty 90, 90d supply, fill #0
  Filled 2021-07-21: qty 90, 90d supply, fill #1
  Filled 2021-10-24: qty 90, 90d supply, fill #2

## 2021-04-06 MED ORDER — MELOXICAM 15 MG PO TABS
15.0000 mg | ORAL_TABLET | Freq: Every day | ORAL | 3 refills | Status: DC
  Filled 2021-04-06: qty 30, 30d supply, fill #0
  Filled 2021-08-20: qty 30, 30d supply, fill #1
  Filled 2021-09-15: qty 30, 30d supply, fill #2

## 2021-04-06 MED FILL — Rosuvastatin Calcium Tab 5 MG: ORAL | 84 days supply | Qty: 24 | Fill #0 | Status: AC

## 2021-04-06 NOTE — Progress Notes (Signed)
Office Visit Note   Patient: Christine Brady           Date of Birth: 01/20/1961           MRN: [id number] Visit Date: 04/06/2021              Requested by: Ronita Hipps, MD [address] [place] [address],  PCP: Ronita Hipps, MD   Assessment & Plan: Visit Diagnoses:  1. Pain in left hip   2. Chronic pain of right knee   3. Trochanteric bursitis, left hip   4. Patellar dislocation, right, sequela     Plan: Given her recurrent right knee patellar dislocations, a MRI at this point is warranted of the right knee to assess the medial patellar femoral retinacular ligament and its competency.  We can also assess the cartilage in her knee and the meniscus given the pain that she gets in the back of her knee.  I did provide a steroid injection over her left hip trochanteric area after describing what that involves as well as the risk and benefits of injections.  I showed her stretching exercises to try for the trochanteric area on the left and I want her to work on quad strengthening on the right.  We will try meloxicam as an anti-inflammatory with 15 mg daily.  All question concerns were answered and addressed.  We will see her back after the MRI of her right knee.  Follow-Up Instructions: Return in about 3 weeks (around 04/27/2021).   Orders:  Orders Placed This Encounter  Procedures  . XR HIP UNILAT W OR W/O PELVIS 1V LEFT  . XR Knee 1-2 Views Right   Meds ordered this encounter  Medications  . meloxicam (MOBIC) 15 MG tablet    Sig: Take 1 tablet (15 mg total) by mouth daily.    Dispense:  30 tablet    Refill:  3      Procedures: No procedures performed   Clinical Data: No additional findings.   Subjective: Chief Complaint  Patient presents with  . Left Hip - Pain  . Right Knee - Pain  The patient comes in today for actually 2 different things.  Her left hip is involved her over the trochanteric area for many years now.  She points to the lateral aspect  of the hip as a source of her pain there is no groin pain.  She crushed her left knee when she was on a horse that younger age and that may have affected her left hip with time the way she walks favoring her left knee.  However she has had recurrent patellar dislocations of her right knee for some time now and recently her right knee did pop out again.  She has a history of arthroscopic surgery on both knees from what I understand.  She has some lateral knee pain and pain medially on that right knee and again I think her gait is started off on both sides due to the previous crushing injury to the left knee combined with recurrent patellar dislocations on the right knee.  She denies any leg length issues.  She tries anti-inflammatories but they do bother her GI system.  She is never been on any prescription dose oral anti-inflammatories such as Celebrex or meloxicam.  She is a thin individual.  She is not a diabetic and non-smoker.  HPI  Review of Systems There is currently listed no headache, chest pain, shortness of breath, fever, chills,  nausea, vomiting  Objective: Vital Signs: Ht 5\' 2"  (1.575 m)   Wt 138 lb (62.6 kg)   BMI 25.24 kg/m   Physical Exam She is alert and orient x3 and in no acute distress Ortho Exam Examination of her left hip shows it moves smoothly and fluidly.  She has severe pain to palpation of the trochanteric area and IT band.  Her right knee does show a positive apprehension sign on trying to sublux her patella.  It is more hypermobile than the left knee.  There is pain over the medial patellofemoral retinacular ligament aspect of the knee and over the fibular head on the right knee.  Her Lachman's is negative. Specialty Comments:  No specialty comments available.  Imaging: XR HIP UNILAT W OR W/O PELVIS 1V LEFT  Result Date: 04/06/2021 An AP pelvis and lateral of the left hip is normal.  There is no significant arthritic changes at all.  XR Knee 1-2 Views  Right  Result Date: 04/06/2021 2 views of the right knee show no acute findings.    PMFS History: Patient Active Problem List   Diagnosis Date Noted  . IBS (irritable bowel syndrome) 07/09/2019  . Headache 07/09/2019  . GERD (gastroesophageal reflux disease) 07/09/2019  . 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 07/03/2019 07/03/2019  . Cecal volvulus ([facility]) 07/03/2019  . Menopausal syndrome 03/13/2018  . History of postoperative nausea 01/19/2017  . Atypical chest pain 11/11/2016  . Hyperlipidemia 11/11/2016  . 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HCC) 11/11/2016  . Epidermoid cyst on back 10/28/2011   Past Medical History:  Diagnosis Date  . Abdominal pain   . Arthritis    difficulty walking  . Atypical chest pain 11/11/2016  . Cholesteatoma of right ear   . Chronic headaches   . 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HCC) 11/11/2016  . Cyst    back  . Hearing aid worn   . History of ITP   . Hyperlipidemia 11/11/2016  . Large ovary 07/09/2019  . PONV (postoperative nausea and vomiting)   . Reflux     Family History  Problem Relation Age of Onset  . Diabetes Mother   . Asthma Mother   . Diabetes type II Mother   . Alcohol abuse Mother   . Hypertension Mother   . Diabetes Father   . Hypertension Father   . Kidney disease Father   . Diabetes type II Father   . Alcohol abuse Father   . Crohn's disease Sister   . Cerebral palsy Brother   . COPD Sister   . Thrombocytopenia Sister     Past Surgical History:  Procedure Laterality Date  . CHOLECYSTECTOMY  2010  . ESOPHAGOGASTRODUODENOSCOPY (EGD) WITH PROPOFOL N/A 06/12/2019   Procedure: ESOPHAGOGASTRODUODENOSCOPY (EGD) WITH PROPOFOL;  Surgeon: Carol Ada, MD;  Location: [facility];  Service: Endoscopy;  Laterality: N/A;  . EXTERNAL EAR SURGERY  2009/2010  . EXTERNAL EAR SURGERY  2010   right ear  . KNEE ARTHROSCOPY     right  . KNEE LIGAMENT RECONSTRUCTION     left  . MASTOIDECTOMY  Right 01/19/2017   Procedure: RIGHT MODIFIED RADICAL MASTOIDECTOMY;  Surgeon: Vicie Mutters, MD;  Location: [facility];  Service: ENT;  Laterality: Right;  . UPPER ESOPHAGEAL ENDOSCOPIC ULTRASOUND (EUS) N/A 06/12/2019   Procedure: UPPER ESOPHAGEAL ENDOSCOPIC ULTRASOUND (EUS);  Surgeon: Carol Ada, MD;  Location: [facility];  Service: Endoscopy;  Laterality: N/A;  . WISDOM TOOTH EXTRACTION     Social History  Occupational History  . Not on file  Tobacco Use  . Smoking status: Never Smoker  . Smokeless tobacco: Never Used  Vaping Use  . Vaping Use: Never used  Substance and Sexual Activity  . Alcohol use: Yes    Comment: occas  . Drug use: No  . Sexual activity: Not on file

## 2021-04-07 ENCOUNTER — Other Ambulatory Visit: Payer: Self-pay

## 2021-04-07 DIAGNOSIS — G8929 Other chronic pain: Secondary | ICD-10-CM

## 2021-04-07 DIAGNOSIS — S83004S Unspecified dislocation of right patella, sequela: Secondary | ICD-10-CM

## 2021-04-07 DIAGNOSIS — M25561 Pain in right knee: Secondary | ICD-10-CM

## 2021-04-17 ENCOUNTER — Encounter: Payer: Self-pay | Admitting: Gastroenterology

## 2021-04-17 ENCOUNTER — Ambulatory Visit (INDEPENDENT_AMBULATORY_CARE_PROVIDER_SITE_OTHER): Payer: 59 | Admitting: Gastroenterology

## 2021-04-17 ENCOUNTER — Other Ambulatory Visit (INDEPENDENT_AMBULATORY_CARE_PROVIDER_SITE_OTHER): Payer: 59

## 2021-04-17 VITALS — BP 120/70 | HR 56 | Ht 62.0 in | Wt 136.0 lb

## 2021-04-17 DIAGNOSIS — R140 Abdominal distension (gaseous): Secondary | ICD-10-CM

## 2021-04-17 DIAGNOSIS — R1011 Right upper quadrant pain: Secondary | ICD-10-CM

## 2021-04-17 DIAGNOSIS — K581 Irritable bowel syndrome with constipation: Secondary | ICD-10-CM

## 2021-04-17 DIAGNOSIS — R196 Halitosis: Secondary | ICD-10-CM | POA: Diagnosis not present

## 2021-04-17 DIAGNOSIS — R110 Nausea: Secondary | ICD-10-CM | POA: Diagnosis not present

## 2021-04-17 DIAGNOSIS — K602 Anal fissure, unspecified: Secondary | ICD-10-CM

## 2021-04-17 LAB — CREATININE, SERUM: Creatinine, Ser: 0.71 mg/dL (ref 0.40–1.20)

## 2021-04-17 LAB — BUN: BUN: 17 mg/dL (ref 6–23)

## 2021-04-17 MED ORDER — AMBULATORY NON FORMULARY MEDICATION: 1 refills | Status: DC

## 2021-04-17 NOTE — Progress Notes (Signed)
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 She also has noticed halitosis Intermittent BRBPR when she wipes, she feels she is not completely evacuating. Also has intermittent rectal discomfort  She is having 1-2 bowel movements daily, taking Miralax 1 capful daily.    Relevant GI Hx:  CT abd & Pelvis with contrast 11/14/19 Stomach/Bowel: Stable postop changes from right hemicolectomy. No evidence of bowel wall thickening or bowel obstruction. A rounded fat attenuation lesion contain internal soft tissue stranding is again seen in the right lower quadrant in the region of the paracolic gutter. This measures 4.1 x 2.8 cm, decreased in size from 5.2 x 4.5 cm on prior exam. This is consistent with resolving fat necrosis.   EGD 05/21/2019: Normal esophagus. Fundic gland polyps removed.  Colonoscopy Normal, due for recall colonoscopy in 2025. Unable to retrieve the report. Will obtain report from Dr.Mann  EGD with EUS 06/12/19: Normal EGD. Mild dilation CBD 8mm s/p cholecystectomy.Normal pancreas.  Robotic proximal colectomy 07/03/19 for intermittent volvulus   Outpatient Encounter Medications as of 04/17/2021  Medication Sig  . Calcium Carbonate-Vitamin D3 600-400 MG-UNIT TABS Take 2 tablets by mouth daily.  [name] Oil-Levomenthol (FDGARD PO) Take 1 capsule by mouth daily.  Marland Kitchen estradiol (ESTRACE) 1 MG tablet TAKE 1 TABLET BY MOUTH ONCE DAILY  .  Hyoscyamine Sulfate SL 0.125 MG SUBL DISSOLVE 1 TABLET BY MOUTH 2 TIMES DAILY AS NEEDED  . ibuprofen (ADVIL) 200 MG tablet Take 400 mg by mouth every 6 (six) hours as needed for headache or moderate pain.  . Lactobacillus Rhamnosus, GG, (RA PROBIOTIC DIGESTIVE CARE) CAPS Take 1 tablet by mouth daily.  . meloxicam (MOBIC) 15 MG tablet Take 1 tablet (15 mg total) by mouth daily.  Marland Kitchen omeprazole (PRILOSEC) 40 MG capsule Take 1 capsule (40 mg total) by mouth daily.  . polyethylene glycol (MIRALAX / GLYCOLAX) 17 g packet Take 17 g by mouth daily.  Marland Kitchen PRESCRIPTION MEDICATION Take 1 capsule by mouth daily before breakfast. Ultra Flora Probiotic  . prochlorperazine (COMPAZINE) 10 MG tablet Take 1 tablet (10 mg total) by mouth every 8 (eight) hours as needed for refractory nausea / vomiting (Use for nausea and / or vomiting unresolved with ondansetron (Zofran)).  . progesterone (PROMETRIUM) 100 MG capsule TAKE 1 CAPSULE BY MOUTH ONCE DAILY  . rosuvastatin (CRESTOR) 5 MG tablet TAKE 1 TABLET BY MOUTH TWICE WEEKLY  . Alum & Mag Hydroxide-Simeth (GI COCKTAIL) SUSP suspension Shake well.  Take 10 ml every 8 hours as needed (Patient not taking: Reported on 04/17/2021)  . [DISCONTINUED] estradiol (ESTRACE) 1 MG tablet Take 1 mg by mouth at bedtime.  (Patient not taking: Reported on 04/17/2021)  . [DISCONTINUED] gabapentin (NEURONTIN) 100 MG capsule TAKE 1 CAPSULE AT BEDTIME FOR 1 DAY THEN TAKE 1 CAPSULE TWICE DAILY FOR 1 DAY THEN TAKE 1 CAPSULE 3 TIMES DAILY IF TOLERATED (Patient not taking: Reported on 04/17/2021)  . [DISCONTINUED] HYDROcodone-acetaminophen (NORCO/VICODIN) 5-325  MG tablet Take 1 tablet by mouth daily. (Patient not taking: Reported on 04/17/2021)  . [DISCONTINUED] linaclotide (LINZESS) 290 MCG CAPS capsule Take 290 mcg by mouth as needed.   . [DISCONTINUED] oxycodone (OXY-IR) 5 MG capsule Take 5 mg by mouth as needed. (Patient not taking: Reported on 04/17/2021)  . [DISCONTINUED] progesterone (PROMETRIUM) 100  MG capsule 1 capsule daily. (Patient not taking: Reported on 04/17/2021)  . [DISCONTINUED] rosuvastatin (CRESTOR) 5 MG tablet TAKE 1 TABLET BY MOUTH TWICE WEEKLY (Patient not taking: Reported on 04/17/2021)   No facility-administered encounter medications on file as of 04/17/2021.    Allergies as of 04/17/2021 - Review Complete 04/17/2021  Allergen Reaction Noted  . Oxycodone  06/04/2019  . Claritin-d [loratadine-pseudoephedrine er] Rash 10/27/2011  . Codeine Rash 10/27/2011    Past Medical History:  Diagnosis Date  . Abdominal pain   . Arthritis    difficulty walking  . Atypical chest pain 11/11/2016  . Cholesteatoma of right ear   . Chronic headaches   . 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HCC) 11/11/2016  . Cyst    back  . Hearing aid worn   . History of ITP   . Hyperlipidemia 11/11/2016  . Large ovary 07/09/2019  . PONV (postoperative nausea and vomiting)   . Reflux     Past Surgical History:  Procedure Laterality Date  . CHOLECYSTECTOMY  2010  . ESOPHAGOGASTRODUODENOSCOPY (EGD) WITH PROPOFOL N/A 06/12/2019   Procedure: ESOPHAGOGASTRODUODENOSCOPY (EGD) WITH PROPOFOL;  Surgeon: Carol Ada, MD;  Location: [facility];  Service: Endoscopy;  Laterality: N/A;  . EXTERNAL EAR SURGERY  2009/2010  . EXTERNAL EAR SURGERY  2010   right ear  . KNEE ARTHROSCOPY     right  . KNEE LIGAMENT RECONSTRUCTION     left  . MASTOIDECTOMY Right 01/19/2017   Procedure: RIGHT MODIFIED RADICAL MASTOIDECTOMY;  Surgeon: Vicie Mutters, MD;  Location: [facility];  Service: ENT;  Laterality: Right;  . UPPER ESOPHAGEAL ENDOSCOPIC ULTRASOUND (EUS) N/A 06/12/2019   Procedure: UPPER ESOPHAGEAL ENDOSCOPIC ULTRASOUND (EUS);  Surgeon: Carol Ada, MD;  Location: [facility];  Service: Endoscopy;  Laterality: N/A;  . WISDOM TOOTH EXTRACTION      Family History  Problem Relation Age of Onset  . Diabetes Mother   . Asthma Mother   . Diabetes type II Mother   . Alcohol abuse Mother    . Hypertension Mother   . Diabetes Father   . Hypertension Father   . Kidney disease Father   . Diabetes type II Father   . Alcohol abuse Father   . Crohn's disease Sister   . Cerebral palsy Brother   . COPD Sister   . Thrombocytopenia Sister     Social History   Socioeconomic History  . Marital status: Single    Spouse name: Not on file  . Number of children: Not on file  . Years of education: Not on file  . Highest education level: Not on file  Occupational History  . Not on file  Tobacco Use  . Smoking status: Never Smoker  . Smokeless tobacco: Never Used  Vaping Use  . Vaping Use: Never used  Substance and Sexual Activity  . Alcohol use: Yes    Comment: occas  . Drug use: No  . Sexual activity: Not on file  Other Topics Concern  . Not on file  Social History Narrative  . Not on file   Social Determinants of Health   Financial Resource Strain: Not on file  Food Insecurity: Not on file  Transportation Needs: Not on file  Physical Activity: Not on file  Stress: Not on file  Social Connections: Not on file  Intimate Partner Violence: Not on file      Review of systems: All other review of systems negative except as mentioned in the HPI.   Physical Exam: Vitals:   04/17/21 0835  BP: 120/70  Pulse: (!) 56  SpO2: 98%   Body mass index is 24.87 kg/m. Gen:      No acute distress HEENT:  sclera anicteric Abd:      soft, non-tender; no palpable masses, no distension Ext:    No edema Neuro: alert and oriented x 3 Psych: normal mood and affect Rectal exam: anorectal tenderness, increased anal sphincter tone, +anal fissure, no external hemorrhoids Anoscopy: not performed  Data Reviewed:  Reviewed labs, radiology imaging, old records and pertinent past GI work up   Assessment and Plan/Recommendations:  60 year old very pleasant female with history of IBS, constipation status post proximal colectomy 06/2019 for chronic intermittent cecal volvulus  with  c/o RUQ abd pain, nausea, abdominal bloating and halitosis  She has h/o fat necrosis and s/p proximal colectomy Will obtain CT abd & pelvis to exclude internal hernia, partial bowel obstruction secondary to adhesions or recurrent fat necrosis Schedule lactulose breath test to exclude SIBO, if positive treat with 2 week course of Rifaximin 550mg  TID  BRBPR and Anal fissure Use 0.125% NTG TID small pea size amount per rectum Bene fiber 1 tablespoon BID with meals Will obtain report of last colonoscopy from Dr.Mann, if persistent bleeding will plan to repeat colonoscopy  IBS-constipation: Use Miralax 1/2 capful BID and titrate based on response to have 1-2 soft BM per day  Return in 2-3 months Dyspepsia: Okay to use FD guard as needed Patient was unable to hold PPI or H2 blocker for 10 days and has not done H. pylori stool antigen  This visit required 40 minutes of patient care (this includes precharting, chart review, review of results, face-to-face time used for counseling as well as treatment plan and follow-up. The patient was provided an opportunity to ask questions and all were answered. The patient agreed with the plan and demonstrated an understanding of the instructions.  Damaris Hippo , MD    CC: Ronita Hipps, MD

## 2021-04-17 NOTE — Patient Instructions (Addendum)
Your provider has requested that you go to the basement level for lab work before leaving today. Press "B" on the elevator. The lab is located at the first door on the left as you exit the elevator.  We have sent a prescription for nitroglycerin 0.125% gel to [facility]. You should apply a pea size amount to your rectum three times daily x 6-8 weeks.  [facility]'s information is below: Address: [address] [place], [place] [address]  [phone]  *Please DO NOT go directly from our office to pick up this medication! Give the pharmacy 1 day to process the prescription as this is compounded and takes time to make.  You have been scheduled for a CT scan of the abdomen and pelvis at [facility] ([address]---this is in the same building as [employer]).   You are scheduled on 04/21/2021 at 9:30am. You should arrive 15 minutes prior to your appointment time for registration. Please follow the written instructions below on the day of your exam:  WARNING: IF YOU ARE ALLERGIC TO IODINE/X-RAY DYE, PLEASE NOTIFY RADIOLOGY IMMEDIATELY AT [phone]! YOU WILL BE GIVEN A 13 HOUR PREMEDICATION PREP.  1) Do not eat or drink anything after 5:30am (4 hours prior to your test) 2) You have been given 2 bottles of oral contrast to drink. The solution may taste better if refrigerated, but do NOT add ice or any other liquid to this solution. Shake well before drinking.    Drink 1 bottle of contrast @ 7:30am (2 hours prior to your exam)  Drink 1 bottle of contrast @ 8:30am (1 hour prior to your exam)  You may take any medications as prescribed with a small amount of water, if necessary. If you take any of the following medications: METFORMIN, GLUCOPHAGE, GLUCOVANCE, AVANDAMET, RIOMET, FORTAMET, Green MET, JANUMET, GLUMETZA or METAGLIP, you MAY be asked to HOLD this medication 48 hours AFTER the exam.  The purpose of you drinking the oral contrast is to  aid in the visualization of your intestinal tract. The contrast solution may cause some diarrhea. Depending on your individual set of symptoms, you may also receive an intravenous injection of x-ray contrast/dye. Plan on being at [facility] for 30 minutes or longer, depending on the type of exam you are having performed.  This test typically takes 30-45 minutes to complete.  If you have any questions regarding your exam or if you need to reschedule, you may call the CT department at [phone] between the hours of 8:00 am and 5:00 pm, Monday-Friday.   You have been given a kit to perform a Lactulose breath test which is completed by a company named [employer].  Please return this using the return mailing label given to you with the kit. Your demographics and insurance information have already been sent to the company and they should be in contact with you over the next week regarding this test. Please keep in mind that you will be getting this call from phone number [phone] or a similar number. If you do not hear from them within this time frame, please call our office at [phone].   Complete Lactulose Breath test after you have done the CT scan and get those results  Take Benefiber 1 tablespoon twice daily  Use Miralax 1/2 capful twice daily  Follow up in 3 months  Due to recent changes in healthcare laws, you may see the results of your imaging and laboratory studies on  MyChart before your provider has had a chance to review them.  We understand that in some cases there may be results that are confusing or concerning to you. Not all laboratory results come back in the same time frame and the provider may be waiting for multiple results in order to interpret others.  Please give [place] 48 hours in order for your provider to thoroughly review all the results before contacting the office for clarification of your results.   If you are age 68 or older, your body mass index  should be between 23-30. Your Body mass index is 24.87 kg/m. If this is out of the aforementioned range listed, please consider follow up with your Primary Care Provider.  If you are age 25 or younger, your body mass index should be between 19-25. Your Body mass index is 24.87 kg/m. If this is out of the aformentioned range listed, please consider follow up with your Primary Care Provider.    I appreciate the  opportunity to care for you  Thank You   Harl Bowie , MD

## 2021-04-18 DIAGNOSIS — R52 Pain, unspecified: Secondary | ICD-10-CM | POA: Diagnosis not present

## 2021-04-18 DIAGNOSIS — K56699 Other intestinal obstruction unspecified as to partial versus complete obstruction: Secondary | ICD-10-CM | POA: Diagnosis not present

## 2021-04-18 DIAGNOSIS — K56600 Partial intestinal obstruction, unspecified as to cause: Secondary | ICD-10-CM | POA: Diagnosis not present

## 2021-04-18 DIAGNOSIS — Z9049 Acquired absence of other specified parts of digestive tract: Secondary | ICD-10-CM | POA: Diagnosis not present

## 2021-04-18 DIAGNOSIS — I959 Hypotension, unspecified: Secondary | ICD-10-CM | POA: Diagnosis not present

## 2021-04-18 DIAGNOSIS — G8929 Other chronic pain: Secondary | ICD-10-CM | POA: Diagnosis not present

## 2021-04-18 DIAGNOSIS — R110 Nausea: Secondary | ICD-10-CM | POA: Diagnosis not present

## 2021-04-18 DIAGNOSIS — K56609 Unspecified intestinal obstruction, unspecified as to partial versus complete obstruction: Secondary | ICD-10-CM | POA: Diagnosis not present

## 2021-04-18 DIAGNOSIS — R1111 Vomiting without nausea: Secondary | ICD-10-CM | POA: Diagnosis not present

## 2021-04-18 DIAGNOSIS — Z79899 Other long term (current) drug therapy: Secondary | ICD-10-CM | POA: Diagnosis not present

## 2021-04-18 DIAGNOSIS — R1084 Generalized abdominal pain: Secondary | ICD-10-CM | POA: Diagnosis not present

## 2021-04-18 DIAGNOSIS — Z4682 Encounter for fitting and adjustment of non-vascular catheter: Secondary | ICD-10-CM | POA: Diagnosis not present

## 2021-04-18 DIAGNOSIS — D693 Immune thrombocytopenic purpura: Secondary | ICD-10-CM | POA: Diagnosis not present

## 2021-04-18 DIAGNOSIS — R109 Unspecified abdominal pain: Secondary | ICD-10-CM | POA: Diagnosis not present

## 2021-04-18 DIAGNOSIS — K3189 Other diseases of stomach and duodenum: Secondary | ICD-10-CM | POA: Diagnosis not present

## 2021-04-18 DIAGNOSIS — K5939 Other megacolon: Secondary | ICD-10-CM | POA: Diagnosis not present

## 2021-04-18 DIAGNOSIS — Z2821 Immunization not carried out because of patient refusal: Secondary | ICD-10-CM | POA: Diagnosis not present

## 2021-04-18 DIAGNOSIS — K6389 Other specified diseases of intestine: Secondary | ICD-10-CM | POA: Diagnosis not present

## 2021-04-18 DIAGNOSIS — R21 Rash and other nonspecific skin eruption: Secondary | ICD-10-CM | POA: Diagnosis not present

## 2021-04-18 DIAGNOSIS — K565 Intestinal adhesions [bands], unspecified as to partial versus complete obstruction: Secondary | ICD-10-CM | POA: Diagnosis not present

## 2021-04-20 ENCOUNTER — Telehealth: Payer: Self-pay | Admitting: Gastroenterology

## 2021-04-20 NOTE — Telephone Encounter (Signed)
My apologies, meant to put needs to be seen this week per her discharge note.  Please advise.

## 2021-04-20 NOTE — Telephone Encounter (Signed)
Please schedule next available OV with Dr. Silverio Decamp.

## 2021-04-20 NOTE — Telephone Encounter (Signed)
Patient calling back stating she is being discharged and was informed she needs to schedule a follow-up for this with Dr. Silverio Decamp.  Please advise.

## 2021-04-20 NOTE — Telephone Encounter (Signed)
Pt called to inform Dr. Silverio Decamp that she is hopitalized at [facility] hospital due to bowel obstruction. She had to cancel her ct scan that was scheduled for tomorrow.

## 2021-04-21 ENCOUNTER — Other Ambulatory Visit: Payer: 59

## 2021-04-21 NOTE — Telephone Encounter (Signed)
Pt scheduled to see Ellouise Newer PA 04/23/21 at 1:30pm. Please let pt know about appt.

## 2021-04-21 NOTE — Telephone Encounter (Signed)
Thank you.  Called patient and is aware of appt.

## 2021-04-22 ENCOUNTER — Other Ambulatory Visit (HOSPITAL_COMMUNITY): Payer: Self-pay

## 2021-04-22 ENCOUNTER — Telehealth: Payer: Self-pay | Admitting: Gastroenterology

## 2021-04-22 ENCOUNTER — Other Ambulatory Visit: Payer: Self-pay | Admitting: *Deleted

## 2021-04-22 NOTE — Telephone Encounter (Signed)
Please send prescription for Zofran 4 mg every 8-12 hours as needed X 30 tabs with 1 refill.  Thank you

## 2021-04-22 NOTE — Telephone Encounter (Signed)
Patient calling back stating would like rx sent to [facility] please.

## 2021-04-22 NOTE — Telephone Encounter (Signed)
Left message for patient to return my call to verify she would like Rx for zofran sent to [facility].  Will continue efforts.

## 2021-04-22 NOTE — Patient Outreach (Signed)
[facility]) Care Management  04/22/2021  Christine Brady 1961-02-09 [id number]  Transition of care telephone call  Referral received:04/22/21 Initial outreach:04/22/21 Insurance: [facility]   Initial unsuccessful telephone call to patient's preferred number in order to complete transition of care assessment; no answer, left HIPAA compliant voicemail message requesting return call.   Objective: Per electronic medical record Christine Brady  was hospitalized at [facility] 5/7-04/20/21 for Bowel Obstruction  Comorbidities include: Chronic idiopathic constipation, s/p proximal colectomy 06/2019.  She  was discharged to home on 04/20/21 without the need for home health services or DME.   Plan: This RNCM will route unsuccessful outreach letter with [facility] [employer] pamphlet and 24 hour Nurse Advice Line Magnet to [facility] [employer] clinical pool to be mailed to patient's home address. This RNCM will attempt another outreach within 4 business days.   Joylene Draft, RN, BSN  [facility] Coordinator  [phone]- Mobile [phone]- Toll Free Main Office

## 2021-04-22 NOTE — Telephone Encounter (Signed)
Inbound call from patient. States she is very nauseous asking if she can have a prescription to help control it without making her sleepy. Best contact number [phone]

## 2021-04-22 NOTE — Telephone Encounter (Signed)
Please advise Dr Nandigam 

## 2021-04-23 ENCOUNTER — Encounter: Payer: Self-pay | Admitting: Physician Assistant

## 2021-04-23 ENCOUNTER — Other Ambulatory Visit: Payer: Self-pay

## 2021-04-23 ENCOUNTER — Ambulatory Visit (INDEPENDENT_AMBULATORY_CARE_PROVIDER_SITE_OTHER)
Admission: RE | Admit: 2021-04-23 | Discharge: 2021-04-23 | Disposition: A | Discharge status: Home / Self Care | Payer: 59 | Source: Ambulatory Visit | Attending: Physician Assistant | Admitting: Physician Assistant

## 2021-04-23 ENCOUNTER — Ambulatory Visit (INDEPENDENT_AMBULATORY_CARE_PROVIDER_SITE_OTHER): Payer: 59 | Admitting: Physician Assistant

## 2021-04-23 ENCOUNTER — Other Ambulatory Visit (HOSPITAL_COMMUNITY): Payer: Self-pay

## 2021-04-23 VITALS — BP 144/78 | HR 64 | Ht 62.0 in | Wt 138.4 lb

## 2021-04-23 DIAGNOSIS — R110 Nausea: Secondary | ICD-10-CM | POA: Diagnosis not present

## 2021-04-23 DIAGNOSIS — Z8719 Personal history of other diseases of the digestive system: Secondary | ICD-10-CM | POA: Diagnosis not present

## 2021-04-23 DIAGNOSIS — Z8379 Family history of other diseases of the digestive system: Secondary | ICD-10-CM

## 2021-04-23 DIAGNOSIS — R109 Unspecified abdominal pain: Secondary | ICD-10-CM | POA: Diagnosis not present

## 2021-04-23 DIAGNOSIS — R1011 Right upper quadrant pain: Secondary | ICD-10-CM

## 2021-04-23 MED ORDER — ONDANSETRON HCL 4 MG PO TABS
ORAL_TABLET | ORAL | 1 refills | Status: DC
  Filled 2021-04-23: qty 30, 10d supply, fill #0

## 2021-04-23 NOTE — Telephone Encounter (Signed)
Rx for Zofran sent to pharmacy as requested.

## 2021-04-23 NOTE — Patient Instructions (Signed)
If you are age 59 or older, your body mass index should be between 23-30. Your Body mass index is 25.31 kg/m. If this is out of the aforementioned range listed, please consider follow up with your Primary Care Provider.  If you are age 9 or younger, your body mass index should be between 19-25. Your Body mass index is 25.31 kg/m. If this is out of the aformentioned range listed, please consider follow up with your Primary Care Provider.   Your provider has requested that you have an abdominal x ray before leaving today. Please go to the basement floor to our Radiology department for the test.  Due to recent changes in healthcare laws, you may see the results of your imaging and laboratory studies on MyChart before your provider has had a chance to review them.  We understand that in some cases there may be results that are confusing or concerning to you. Not all laboratory results come back in the same time frame and the provider may be waiting for multiple results in order to interpret others.  Please give [place] 48 hours in order for your provider to thoroughly review all the results before contacting the office for clarification of your results.   Thank you for choosing me and [facility].  Ellouise Newer, PA-C

## 2021-04-23 NOTE — Progress Notes (Addendum)
Chief Complaint: Nausea  HPI:    Mrs. 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 Silverio Decamp, who presents clinic today with complaint of nausea.    04/17/2021 patient seen in clinic by Dr. Silverio Decamp for right upper quadrant abdominal pain.  She described this is constant and severe and radiating to the back associated bloating and intermittent nausea.  Also describes intermittent bright red blood per rectum when she wiped and felt like she is not completely evacuating with some intermittent rectal discomfort.  Having 1-2 bowel movements daily taking MiraLAX 1 capful daily.  At that time was discussed that she had history of fat necrosis and status post proximal colectomy.  A CT abdomen pelvis to exclude internal hernia, partial bowel obstruction secondary to adhesions or recurrent fat necrosis was ordered.  Also scheduled for lactulose breath test to exclude SIBO.  We will plan to treat if positive with rifaximin.  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  There were plans for repeat colonoscopy if she continues bleeding.  Told to return in 2 to 3 months.    04/20/2021 patient called clinic to explain that she was hospitalized at [facility] due to bowel obstruction and she had to cancel her CT scan.  Per her discharge note she needed to be seen this week with Dr. Silverio Decamp.    04/22/2021 Zofran prescription sent to the pharmacy.    Today, patient tells me that the day that she saw Dr. Silverio Decamp she felt slightly okay but that evening the pain in her right upper quadrant increased and went into the next day, it was so severe that it was coming  in spasms that would not go away and left her in her bed unable to even get up to help herself.  She  had nausea and started vomiting and called the EMS who brought her to [facility].  She was told she had a bowel obstruction after an x-ray and CT, they "put a tube down my nose", she stayed until 04/20/2021 and things seem to get slightly better but she tells me they have been increasing again. Now she is constantly nauseous and afraid to eat anything.  She is passing a small amount of stool but never feels empty and is passing some gas.  She is worried about it coming back and what she needs to do next.    Denies fever, chills or weight loss.   Relevant GI Hx:  CT abd & Pelvis with contrast 11/14/19 Stomach/Bowel: Stable postop changes from right hemicolectomy. No evidence of bowel wall thickening or bowel obstruction. A rounded fat attenuation lesion contain internal soft tissue stranding is again seen in the right lower quadrant in the region of the paracolic gutter. This measures 4.1 x 2.8 cm, decreased in size from 5.2 x 4.5 cm on prior exam. This is consistent with resolving fat necrosis.   EGD 05/21/2019: Normal esophagus. Fundic gland polyps removed.  Colonoscopy Normal, due for recall colonoscopy in 2025. Unable to retrieve the report. Will obtain report from Dr.Mann  EGD with EUS 06/12/19: Normal EGD. Mild dilation CBD 33mm s/p cholecystectomy.Normal pancreas.  Robotic proximal colectomy 07/03/19 for intermittent volvulus  Past Medical History:  Diagnosis Date  . Abdominal pain   . Arthritis  difficulty walking  . Atypical chest pain 11/11/2016  . Cholesteatoma of right ear   . Chronic headaches   . 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HCC) 11/11/2016  . Cyst    back  . Hearing aid worn   . History of ITP   . Hyperlipidemia 11/11/2016  . Large ovary 07/09/2019  . PONV (postoperative nausea and vomiting)   . Reflux     Past Surgical History:  Procedure Laterality Date  . CHOLECYSTECTOMY  2010  . ESOPHAGOGASTRODUODENOSCOPY (EGD) WITH PROPOFOL N/A  06/12/2019   Procedure: ESOPHAGOGASTRODUODENOSCOPY (EGD) WITH PROPOFOL;  Surgeon: Carol Ada, MD;  Location: [facility];  Service: Endoscopy;  Laterality: N/A;  . EXTERNAL EAR SURGERY  2009/2010  . EXTERNAL EAR SURGERY  2010   right ear  . KNEE ARTHROSCOPY     right  . KNEE LIGAMENT RECONSTRUCTION     left  . MASTOIDECTOMY Right 01/19/2017   Procedure: RIGHT MODIFIED RADICAL MASTOIDECTOMY;  Surgeon: Vicie Mutters, MD;  Location: [place];  Service: ENT;  Laterality: Right;  . UPPER ESOPHAGEAL ENDOSCOPIC ULTRASOUND (EUS) N/A 06/12/2019   Procedure: UPPER ESOPHAGEAL ENDOSCOPIC ULTRASOUND (EUS);  Surgeon: Carol Ada, MD;  Location: [facility];  Service: Endoscopy;  Laterality: N/A;  . WISDOM TOOTH EXTRACTION      Current Outpatient Medications  Medication Sig Dispense Refill  . Alum & Mag Hydroxide-Simeth (GI COCKTAIL) SUSP suspension Shake well.  Take 10 ml every 8 hours as needed (Patient not taking: Reported on 04/17/2021) 420 mL 1  . AMBULATORY NON FORMULARY MEDICATION Medication Name: Nitroglycerin gel 0.125% to use a pea sized amount per rectum three times daily as needed 30 g 1  . Calcium Carbonate-Vitamin D3 600-400 MG-UNIT TABS Take 2 tablets by mouth daily.    [name] Oil-Levomenthol (FDGARD PO) Take 1 capsule by mouth daily.    Marland Kitchen estradiol (ESTRACE) 1 MG tablet TAKE 1 TABLET BY MOUTH ONCE DAILY 90 tablet 4  . Hyoscyamine Sulfate SL 0.125 MG SUBL DISSOLVE 1 TABLET BY MOUTH 2 TIMES DAILY AS NEEDED 120 tablet 1  . ibuprofen (ADVIL) 200 MG tablet Take 400 mg by mouth every 6 (six) hours as needed for headache or moderate pain.    . Lactobacillus Rhamnosus, GG, (RA PROBIOTIC DIGESTIVE CARE) CAPS Take 1 tablet by mouth daily.    . meloxicam (MOBIC) 15 MG tablet Take 1 tablet (15 mg total) by mouth daily. 30 tablet 3  . omeprazole (PRILOSEC) 40 MG capsule Take 1 capsule (40 mg total) by mouth daily. 90 capsule 3  . ondansetron (ZOFRAN) 4 MG tablet Take 1 tablet by  mouth every 8 to 12 hours as needed 30 tablet 1  . polyethylene glycol (MIRALAX / GLYCOLAX) 17 g packet Take 17 g by mouth daily.    Marland Kitchen PRESCRIPTION MEDICATION Take 1 capsule by mouth daily before breakfast. Ultra Flora Probiotic    . prochlorperazine (COMPAZINE) 10 MG tablet Take 1 tablet (10 mg total) by mouth every 8 (eight) hours as needed for refractory nausea / vomiting (Use for nausea and / or vomiting unresolved with ondansetron (Zofran)). 10 tablet 2  . progesterone (PROMETRIUM) 100 MG capsule TAKE 1 CAPSULE BY MOUTH ONCE DAILY 90 capsule 4  . rosuvastatin (CRESTOR) 5 MG tablet TAKE 1 TABLET BY MOUTH TWICE WEEKLY 90 tablet 0   No current facility-administered medications for this visit.    Allergies as of 04/23/2021 - Review Complete 04/17/2021  Allergen Reaction Noted  . Oxycodone  06/04/2019  . Claritin-d [  loratadine-pseudoephedrine er] Rash 10/27/2011  . Codeine Rash 10/27/2011    Family History  Problem Relation Age of Onset  . Diabetes Mother   . Asthma Mother   . Diabetes type II Mother   . Alcohol abuse Mother   . Hypertension Mother   . Diabetes Father   . Hypertension Father   . Kidney disease Father   . Diabetes type II Father   . Alcohol abuse Father   . Crohn's disease Sister   . Cerebral palsy Brother   . COPD Sister   . Thrombocytopenia Sister     Social History   Socioeconomic History  . Marital status: Single    Spouse name: Not on file  . Number of children: Not on file  . Years of education: Not on file  . Highest education level: Not on file  Occupational History  . Not on file  Tobacco Use  . Smoking status: Never Smoker  . Smokeless tobacco: Never Used  Vaping Use  . Vaping Use: Never used  Substance and Sexual Activity  . Alcohol use: Yes    Comment: occas  . Drug use: No  . Sexual activity: Not on file  Other Topics Concern  . Not on file  Social History Narrative  . Not on file   Social Determinants of Health   Financial  Resource Strain: Not on file  Food Insecurity: Not on file  Transportation Needs: Not on file  Physical Activity: Not on file  Stress: Not on file  Social Connections: Not on file  Intimate Partner Violence: Not on file    Review of Systems:    Constitutional: No weight loss, fever or chills Cardiovascular: No chest pain Respiratory: No SOB  Gastrointestinal: See HPI and otherwise negative   Physical Exam:  Vital signs: BP (!) 144/78 (BP Location: Left Arm, Patient Position: Sitting, Cuff Size: Normal)   Pulse 64   Ht 5\' 2"  (1.575 m)   Wt 138 lb 6 oz (62.8 kg)   BMI 25.31 kg/m   Constitutional:   Pleasant Caucasian female appears to be in NAD, Well developed, Well nourished, alert and cooperative Respiratory: Respirations even and unlabored. Lungs clear to auscultation bilaterally.   No wheezes, crackles, or rhonchi.  Cardiovascular: Normal S1, S2. No MRG. Regular rate and rhythm. No peripheral edema, cyanosis or pallor.  Gastrointestinal:  Soft, nondistended, moderate right upper quadrant and right lower quadrant TTP with some guarding.  Normal bowel sounds. No appreciable masses or hepatomegaly. Rectal:  Not performed.  Psychiatric:Demonstrates good judgement and reason without abnormal affect or behaviors.  Requesting recent labs and imaging  Assessment: 1.  Bowel obstruction: Hospitalized from 5/6 through 5/9 with bowel obstruction at [facility], we are requesting records 2.  Right upper quadrant pain: Concern for repeat/ongoing above 3.  Nausea  Plan: 1.  Told patient I am concerned that she has an ongoing bowel obstruction.  Ordered two-view abdominal x-ray today.  We will give her further recommendations pending this exam. 2.  For now recommend the patient stay on a clear liquid or low fiber diet. 3.  Patient would likely benefit from an updated colonoscopy, will ask Dr. Silverio Decamp her opinion on timing. 4.  Patient to follow in clinic per recommendations after  x-ray.  Ellouise Newer, PA-C [facility] 04/23/2021, 1:26 PM  Cc: Ronita Hipps, MD   Addendum: 04/23/2021 2:53 PM  Received records from [place].  04/18/2021 CT of the abdomen pelvis with contrast showed small bowel  obstruction with transition point in the right mid abdomen, likely due to adhesions.  NG tube was placed in the ED patient given IV fluids and bowel rest.  04/19/2021 abdominal x-ray with improving bowel gas pattern with distal progression of gas into the colon and decreasing gaseous distention of the small bowel in the midabdomen.  04/20/2021 discharged.  It looks like patient had a small bowel obstruction.  Given this not sure that a colonoscopy would be beneficial other than to update.  I have already asked Dr. Silverio Decamp on her opinion moving forward.  We will see what x-ray shows today.  Ellouise Newer, PA-C

## 2021-04-27 ENCOUNTER — Telehealth: Payer: Self-pay | Admitting: Gastroenterology

## 2021-04-27 ENCOUNTER — Other Ambulatory Visit: Payer: Self-pay | Admitting: *Deleted

## 2021-04-27 DIAGNOSIS — Z8719 Personal history of other diseases of the digestive system: Secondary | ICD-10-CM

## 2021-04-27 DIAGNOSIS — R1011 Right upper quadrant pain: Secondary | ICD-10-CM

## 2021-04-27 NOTE — Telephone Encounter (Signed)
Inbound call from patient. States she had a recent blockage and currently having stomach pain 8/10 on right side of stomach. Wants to know if there is something that she can take to help? Have not tried tylenol or advil because usually does not help. States she have tried oscimin but it didn't not help. Best call back  [phone]

## 2021-04-27 NOTE — Telephone Encounter (Signed)
Please schedule CT abdomen and pelvis without contrast to make sure she does not have persistent partial bowel obstruction.

## 2021-04-27 NOTE — Telephone Encounter (Signed)
Spoke with patient, she reports that she has ongoing RUQ pain. She states that it feels like a constant piercing pain but it is not the same pain that she had before. She states that after she took the Levsin the pain let up a little but then it comes right back. Patient states that she has not been able to eat all day. Still tolerating liquids okay. She did not want to try any Tylenol or Ibuprofen because she had not eating anything. Requesting a prescription to help with the pain. Dr. Silverio Decamp please advise as Anderson Malta is out of the office, thanks.

## 2021-04-27 NOTE — Patient Outreach (Signed)
[place] [place] [facility]) Care Management  04/27/2021  OTIS BURRESS [birth year] [id number]   Transition of care call Referral received: 04/22/21 Initial outreach attempt: 04/22/21 Insurance: [employer]    2nd unsuccessful telephone call to patient's preferred contact number in order to complete post hospital discharge transition of care assessment , no answer left HIPAA compliant message requesting return call.    Objective: Per electronic medical record Waverley Krempasky was hospitalized at [facility] 5/7-04/20/21 for Bowel Obstruction  Comorbidities include: Chronic idiopathic constipation, s/p proximal colectomy 06/2019.  She  was discharged to home on 04/20/21 without the need for home health servicesor DME.     Plan If no return call from patient will attempt 3rd outreach in the next 4 business days.    Joylene Draft, RN, BSN  [place] Management Coordinator  [phone]- Mobile [phone]- Toll Free Main Office

## 2021-04-28 ENCOUNTER — Ambulatory Visit: Payer: 59 | Admitting: Orthopaedic Surgery

## 2021-04-28 NOTE — Telephone Encounter (Signed)
Dr. Silverio Decamp, patient wanted to know if you could review her x-ray from 04/23/21? Thanks  Patient has been scheduled for a CT scan at [place] on Monday, 05/04/21 at 1:30 PM. NPO 4 hours prior. Drink 1st bottle at 11:30 AM, drink 2nd bottle at 12:30 PM. Patient is aware of appt, will place contrast and instructions at the front desk for patient to pick up this week. Patient states that she has access to her My Chart, advised that I will send her a copy there as well. Patient verbalized understanding and had no concerns at the end of the call.

## 2021-04-30 ENCOUNTER — Other Ambulatory Visit: Payer: Self-pay | Admitting: *Deleted

## 2021-04-30 ENCOUNTER — Encounter: Payer: Self-pay | Admitting: *Deleted

## 2021-04-30 NOTE — Patient Outreach (Signed)
[place] [facility]) Care Management  04/30/2021  Christine Brady 1961/06/21 [id number]   Transition of care call/case closure   Referral received:04/22/21 Initial outreach:04/22/21 Insurance: [facility]    Subjective: 3rd  successful telephone call to patient's preferred number in order to complete transition of care assessment; 2 HIPAA identifiers verified. Explained purpose of call and completed transition of care assessment.  States she is doing better but still has days when right upper  abdominal discomfort. She discussed history of prior bowel obstruction and surgery and complaints of discomfort feeling the same as times .She reports plan for follow up CT in the next week. She reports having daily bowel movement more formed, since adding benefiber as recommended by GI at follow up.  She denies vomiting and tolerance of diet problems.  Patient friends  are assisting with her  recovery.   Reviewed accessing the following [place] Benefits : She denies any ongoing health issues and says shedoes not need a referral to one of the [place] chronic disease management programs.  She does  have the hospital indemnity ad appreciative of reminder to file claim.  She  uses a Ambulance person, at [facility] .       Objective:  Gerard Creasmanwas hospitalized [facility] 5/7-04/20/21 for Bowel ObstructionComorbidities include: Chronic idiopathic constipation, s/p proximal colectomy 06/2019.  Shewas discharged to home on 5/9/22without the need for home health servicesor DME.    Assessment:  Patient voices good understanding of all discharge instructions.  See transition of care flowsheet for assessment details.   Plan:  Reviewed hospital discharge diagnosis of Bowel obstruction    and discharge treatment plan using hospital discharge instructions, assessing medication adherence, reviewing problems requiring  provider notification, and discussing the importance of follow up with surgeon, primary care provider and/or specialists as directed.  Reviewed [place] healthy lifestyle program information to receive discounted premium for  2023   Step 1: Get  your annual physical  Step 2: Complete your health assessment  Step 3:Identify your current health status and complete the corresponding action step between December 13, 2020 and August 13, 2021.    Patient agreeable to call in the next week for follow up on additional transition of care needs post upcoming test.      Joylene Draft, RN, BSN  [place] Management Coordinator  [phone]- Mobile [phone]- [place]

## 2021-05-01 NOTE — Progress Notes (Signed)
She is scheduled for non contrast CT abd & pelvis to further evaluate  Reviewed and agree with documentation and assessment and plan. Damaris Hippo , MD

## 2021-05-04 ENCOUNTER — Ambulatory Visit (INDEPENDENT_AMBULATORY_CARE_PROVIDER_SITE_OTHER)
Admission: RE | Admit: 2021-05-04 | Discharge: 2021-05-04 | Disposition: A | Discharge status: Home / Self Care | Payer: 59 | Source: Ambulatory Visit | Attending: Gastroenterology | Admitting: Gastroenterology

## 2021-05-04 ENCOUNTER — Other Ambulatory Visit: Payer: Self-pay

## 2021-05-04 DIAGNOSIS — Z8719 Personal history of other diseases of the digestive system: Secondary | ICD-10-CM | POA: Diagnosis not present

## 2021-05-04 DIAGNOSIS — K56609 Unspecified intestinal obstruction, unspecified as to partial versus complete obstruction: Secondary | ICD-10-CM | POA: Diagnosis not present

## 2021-05-04 DIAGNOSIS — R1011 Right upper quadrant pain: Secondary | ICD-10-CM

## 2021-05-04 DIAGNOSIS — Z9049 Acquired absence of other specified parts of digestive tract: Secondary | ICD-10-CM | POA: Diagnosis not present

## 2021-05-06 ENCOUNTER — Telehealth: Payer: Self-pay

## 2021-05-06 ENCOUNTER — Other Ambulatory Visit (HOSPITAL_COMMUNITY): Payer: Self-pay

## 2021-05-06 ENCOUNTER — Other Ambulatory Visit: Payer: Self-pay

## 2021-05-06 ENCOUNTER — Telehealth: Payer: Self-pay | Admitting: Gastroenterology

## 2021-05-06 MED ORDER — DICYCLOMINE HCL 10 MG PO CAPS
10.0000 mg | ORAL_CAPSULE | Freq: Three times a day (TID) | ORAL | 0 refills | Status: DC | PRN
  Filled 2021-05-06: qty 90, 30d supply, fill #0

## 2021-05-06 NOTE — Telephone Encounter (Signed)
Patient called regarding her referral for mri she stated she spoke to [place] [facility] and they told her she cant go through their machine due to having implants on the right side of her head and one in her left ear she is requesting a call back:[phone]

## 2021-05-06 NOTE — Telephone Encounter (Signed)
Inbound call from patient. States she is waiting on CT results. States she have not had anything to eat since Monday and severe stomach pains. Would like a call back number [phone]

## 2021-05-06 NOTE — Telephone Encounter (Signed)
Called the patient. No answer. Left her a voicemail asking she return my call to discuss.

## 2021-05-06 NOTE — Telephone Encounter (Signed)
Discussed with the patient. Agrees to this plan. Understands not to take this with Levsin (she states she has not been using it)

## 2021-05-06 NOTE — Telephone Encounter (Signed)
Spoke with the patient. Advised of her CT results and the instructions for diet. Patient states "I am still in pain. I thought I was going to have to go to the emergency room last night." She c/o her entire abdomen hurting, and her bowel movements are so soft they are almost diarrhea. Compliant with the low residue diet and water intake per her statement.

## 2021-05-06 NOTE — Telephone Encounter (Signed)
Unclear etiology for abdominal pain given CT abdomen pelvis was unrevealing for any acute pathology. Please advise her to use dicyclomine 10 mg every 8 hours as needed.

## 2021-05-07 ENCOUNTER — Other Ambulatory Visit: Payer: Self-pay

## 2021-05-07 DIAGNOSIS — G8929 Other chronic pain: Secondary | ICD-10-CM

## 2021-05-07 NOTE — Telephone Encounter (Signed)
LMOM for patient we are sending her for a CT instead

## 2021-05-07 NOTE — Telephone Encounter (Signed)
I guess then at least a CT scan of the knee

## 2021-05-08 ENCOUNTER — Other Ambulatory Visit: Payer: Self-pay | Admitting: *Deleted

## 2021-05-08 ENCOUNTER — Telehealth: Payer: Self-pay | Admitting: *Deleted

## 2021-05-08 NOTE — Telephone Encounter (Signed)
GI records received today from Dr Lorie Apley office. Placed in Dr Jillyn Hidden inbox on her desk

## 2021-05-08 NOTE — Patient Outreach (Signed)
[place] [facility]) Care Management  05/08/2021  BRIELLAH BAIK [birth year] [id number]   Transition of care follow up/Case Closure    Referral received:04/22/21 Initial outreach:04/22/21 Insurance: [place] [employer]    Subjective: Successful follow up call to patient, she reports doing better.  She reports receiving results of follow up abd scan , and reassured no blockage noted.  She reports discussing with MD regarding continued abdominal discomfort and has been prescribed Dicyclomine, she states that is working when taking around meal time.  She reports continuing to be on low residue diet discussed foods to avoid, interested in additional education handout.     Objective: Avion Creasmanwas hospitalized [facility] 5/7-04/20/21 for Bowel ObstructionComorbidities include: Chronic idiopathic constipation, s/p proximal colectomy 06/2019.  Shewas discharged to home on 5/9/22without the need for home health servicesor DME.   Plan Will send patient EMMI handout on low residue diet, no other ongoing care coordination needs identified.  Will close case to [facility] care management.    Joylene Draft, RN, BSN  [place] Management Coordinator  [phone]- Mobile [phone]- Toll Free Main Office

## 2021-05-27 ENCOUNTER — Telehealth: Payer: Self-pay | Admitting: Orthopaedic Surgery

## 2021-05-27 ENCOUNTER — Other Ambulatory Visit: Payer: Self-pay

## 2021-05-27 ENCOUNTER — Ambulatory Visit
Admission: RE | Admit: 2021-05-27 | Discharge: 2021-05-27 | Disposition: A | Discharge status: Home / Self Care | Payer: 59 | Source: Ambulatory Visit | Attending: Orthopaedic Surgery | Admitting: Orthopaedic Surgery

## 2021-05-27 DIAGNOSIS — M11261 Other chondrocalcinosis, right knee: Secondary | ICD-10-CM | POA: Diagnosis not present

## 2021-05-27 DIAGNOSIS — G8929 Other chronic pain: Secondary | ICD-10-CM

## 2021-05-27 DIAGNOSIS — M25561 Pain in right knee: Secondary | ICD-10-CM

## 2021-05-27 DIAGNOSIS — M1711 Unilateral primary osteoarthritis, right knee: Secondary | ICD-10-CM | POA: Diagnosis not present

## 2021-05-27 NOTE — Telephone Encounter (Signed)
Pt had a CT scan done today and would like a CB with the results when we get them, since due to her work schedule right now she can't get in for an appt.   [phone]

## 2021-05-28 ENCOUNTER — Other Ambulatory Visit: Payer: Self-pay

## 2021-05-28 DIAGNOSIS — G8929 Other chronic pain: Secondary | ICD-10-CM

## 2021-05-28 DIAGNOSIS — M25561 Pain in right knee: Secondary | ICD-10-CM

## 2021-05-28 IMAGING — MR MR ABDOMEN WO/W CM MRCP
11 of 25 series · 18 of 48 positions shown · IV contrast (gadavist)
Comparison: CT abdomen from 04/13/2019
COMPARISON: CT abdomen from 04/13/2019

Addendum:
CLINICAL DATA: Right upper abdominal pain for 8 weeks

EXAM:
MRI ABDOMEN WITHOUT AND WITH CONTRAST (INCLUDING MRCP)
TECHNIQUE: Multiplanar multisequence MR imaging of the abdomen was performed
both before and after the administration of intravenous contrast.
Heavily T2-weighted images of the biliary and pancreatic ducts were
obtained, and three-dimensional MRCP images were rendered by post
processing.
CONTRAST:  6 cc Gadavist

[Series 3: T2 fat-sat · axial · 5.0mm · 0.86mm/px · 1 of 58 slices shown]
[im 1/58]
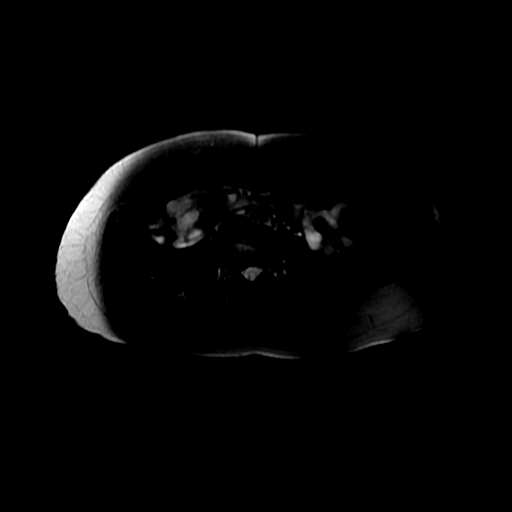

[Series 5: MRCP · coronal · 2.0mm · 0.70mm/px · 1 of 55 slices shown (1 of 2)]
[im 1/55]
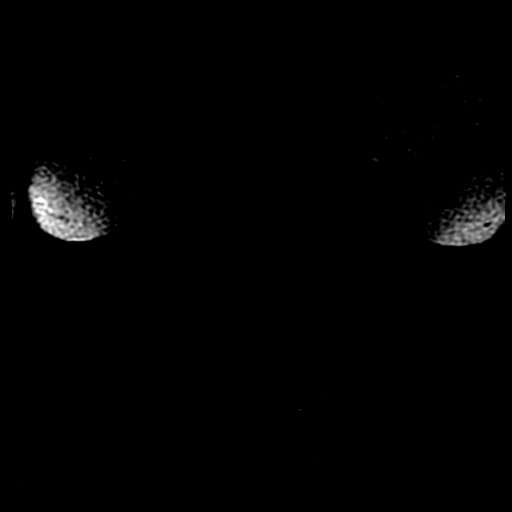

[Series 6: DWI b500 · axial · 6.0mm · 1.76mm/px · z∈[-114,+182]mm · 2 of 78 slices shown]
[im 1/78]
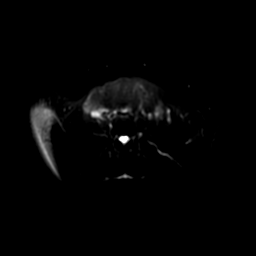
[im 78/78]
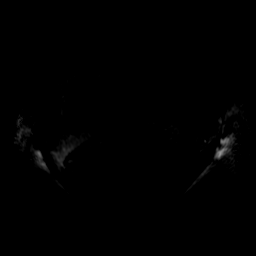

[Series 7: T2 · axial · 5.0mm · 0.86mm/px · 1 of 58 slices shown]
[im 1/58]
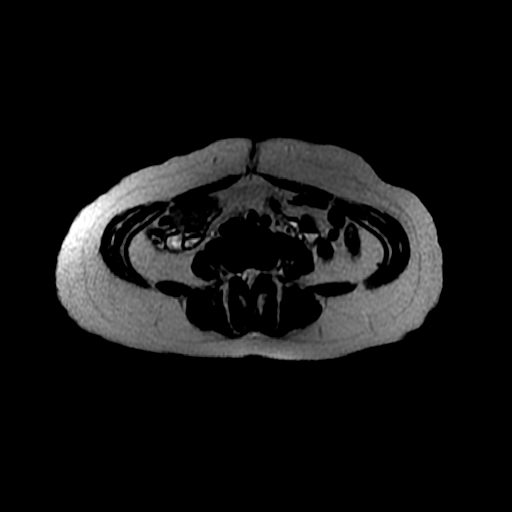

[Series 8: bSSFP fat-sat · coronal · 5.0mm · 0.68mm/px · 1 of 37 slices shown (1 of 2)]
[im 1/37]
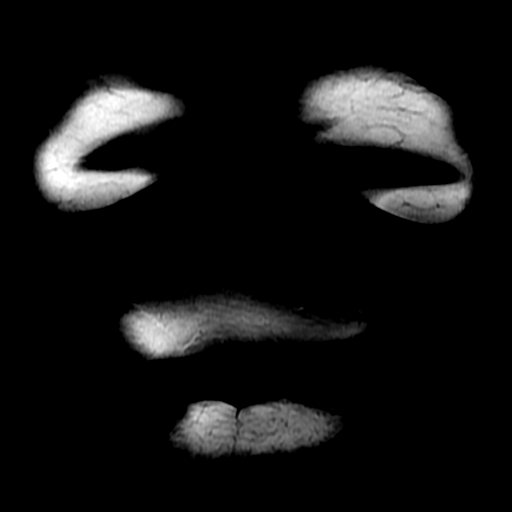

[Series 9: bSSFP fat-sat · coronal · 5.0mm · 0.78mm/px · 1 of 37 slices shown (2 of 2)]
[im 1/37]
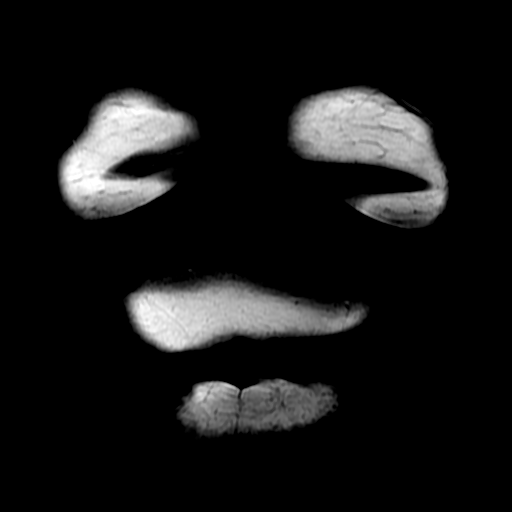

[Series 10: ax dualecho bh · axial · 5.0mm · 0.86mm/px · z∈[-95,+190]mm · 3 of 116 slices shown]
[im 1/116]
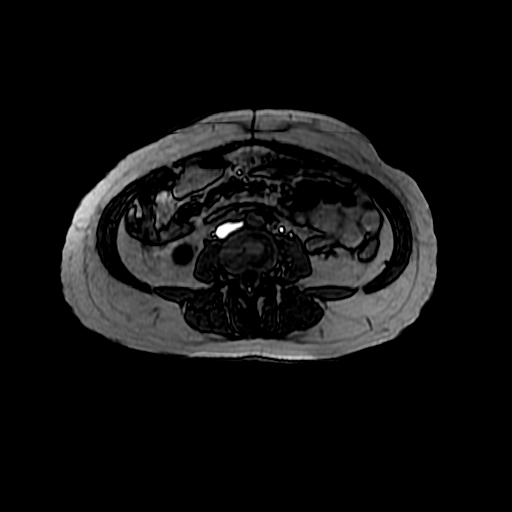
[im 58/116]
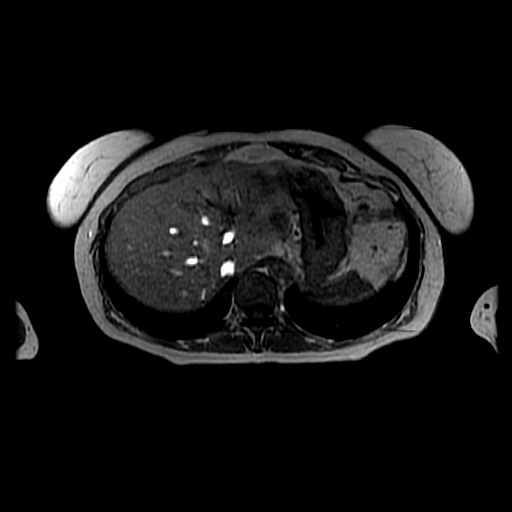
[im 116/116]
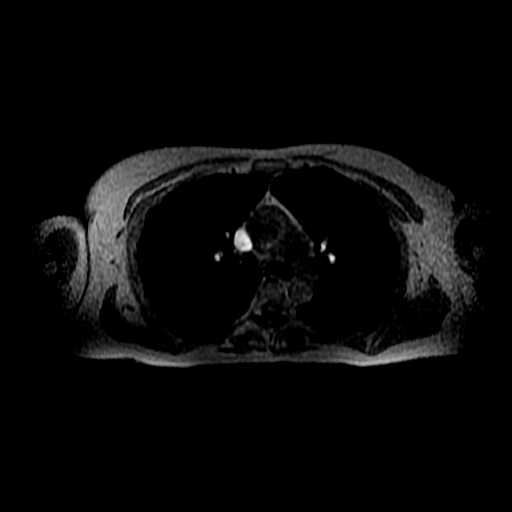

[Series 11: MRCP · coronal · 40.0mm · 0.70mm/px · 1 of 9 slices shown (2 of 2)]
[im 1/9]
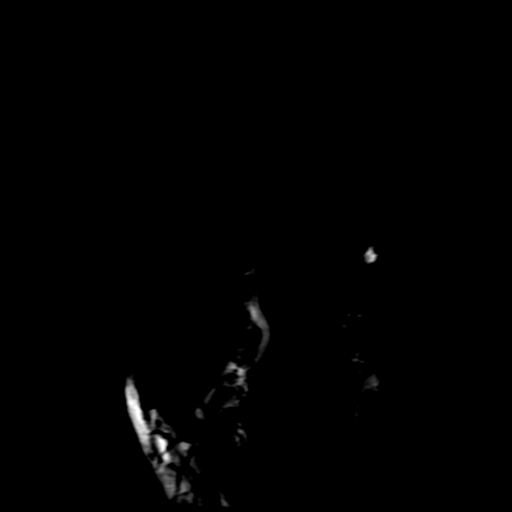

[Series 14: T1 dynamic · coronal · delayed · 8.0mm · 0.72mm/px · 2 of 72 slices shown]
[im 1/72]
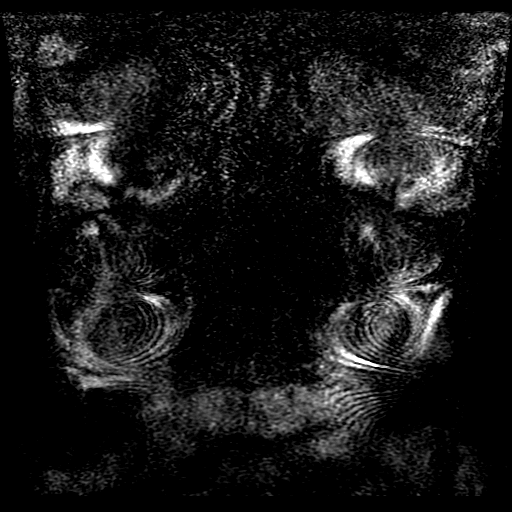
[im 72/72]
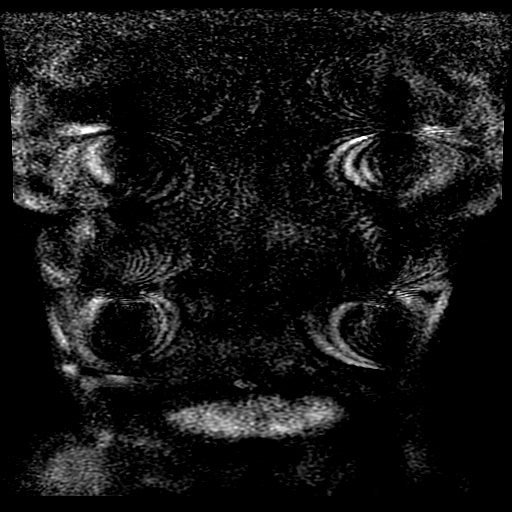

[Series 400: reformatted · axial · 1.6mm · 0.62mm/px · z∈[+45,+175]mm · 4 of 172 slices shown (1 of 2)]
[im 1/172]
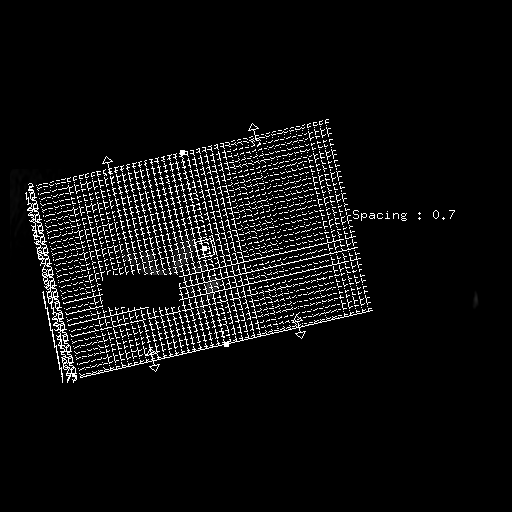
[im 58/172]
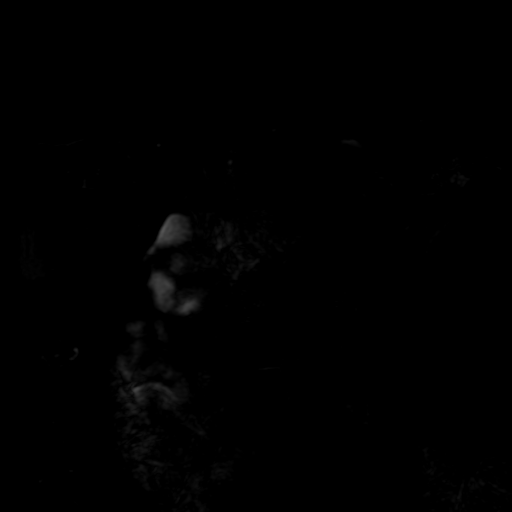
[im 115/172]
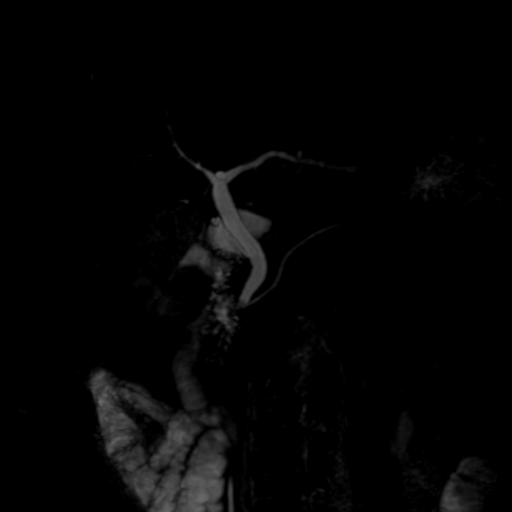
[im 172/172]
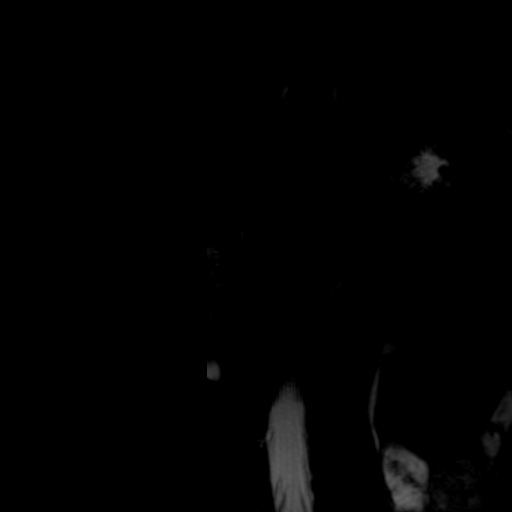

[Series 401: reformatted · coronal · 100.0mm · 0.51mm/px · 1 of 156 slices shown (2 of 2)]
[im 1/156]
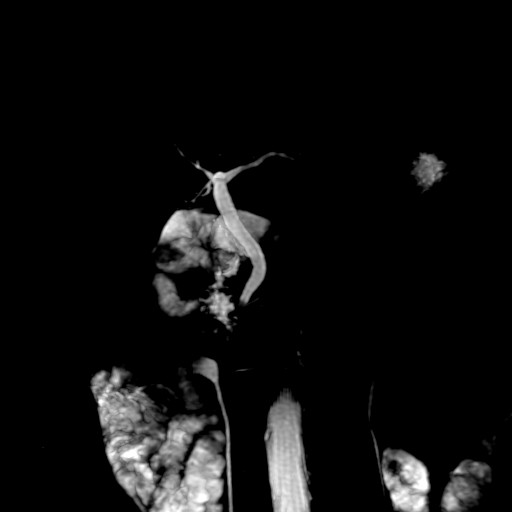

[18 of 48 positions shown; findings below may reference images not displayed]

FINDINGS: Despite efforts by the technologist and patient, motion artifact is
present on today's exam and could not be eliminated. This reduces
exam sensitivity and specificity.

Lower chest: Unremarkable

Hepatobiliary: Cholecystectomy noted. The common hepatic duct
measures 8 mm in diameter and the common bile duct measures 6 mm in
diameter. Slight indistinctness of the distal tip of the CBD but
without appreciable filling defect. No hepatic mass or significant
abnormal hepatic enhancement.

Pancreas:  Unremarkable

Spleen:  Unremarkable

Adrenals/Urinary Tract:  Unremarkable

Stomach/Bowel: Potential wall thickening in the gastric fundus and
proximal body although this may be due to nondistention. Previous
distended colon no longer appreciated.

Vascular/Lymphatic:  Unremarkable

Other:  No supplemental non-categorized findings.

Musculoskeletal: Unremarkable
IMPRESSION: 1. A specific cause for patient's continued right upper abdominal
pain is not identified. The prior colonic dilatation has resolved.
2. There is some potential mild wall thickening in the proximal
stomach which could be from gastritis although more likely a simply
from nondistention.
3. Mildly dilated common bile duct and common hepatic duct, likely a
physiologic response to cholecystectomy.

ADDENDUM:
The original report was by Dr. Dess Martir. The following
addendum is by Dr. Dess Martir:

The patient has returned and a [DATE] of dynamic postcontrast
images through the upper abdomen has been obtained. These
demonstrate less issue with motion artifact compared to the prior
post-contrast images.

Normal hepatic, portal venous, and hepatic venous enhancement is
observed. Normal pancreatic, splenic, renal, and adrenal
enhancement. No right lower rib lesion. The cause of the patient's
right upper quadrant abdominal pain remains occult.

*** End of Addendum ***
FINDINGS: Despite efforts by the technologist and patient, motion artifact is
present on today's exam and could not be eliminated. This reduces
exam sensitivity and specificity.

Lower chest: Unremarkable

Hepatobiliary: Cholecystectomy noted. The common hepatic duct
measures 8 mm in diameter and the common bile duct measures 6 mm in
diameter. Slight indistinctness of the distal tip of the CBD but
without appreciable filling defect. No hepatic mass or significant
abnormal hepatic enhancement.

Pancreas:  Unremarkable

Spleen:  Unremarkable

Adrenals/Urinary Tract:  Unremarkable

Stomach/Bowel: Potential wall thickening in the gastric fundus and
proximal body although this may be due to nondistention. Previous
distended colon no longer appreciated.

Vascular/Lymphatic:  Unremarkable

Other:  No supplemental non-categorized findings.

Musculoskeletal: Unremarkable
IMPRESSION: 1. A specific cause for patient's continued right upper abdominal
pain is not identified. The prior colonic dilatation has resolved.
2. There is some potential mild wall thickening in the proximal
stomach which could be from gastritis although more likely a simply
from nondistention.
3. Mildly dilated common bile duct and common hepatic duct, likely a
physiologic response to cholecystectomy.

## 2021-05-28 NOTE — Telephone Encounter (Signed)
Referral sent 

## 2021-06-04 NOTE — Progress Notes (Signed)
Subjective:    I'm seeing this patient as a consultation for: Dr. Jean Rosenthal. Note will be routed back to referring provider/PCP.  CC: Right knee pain  I, Wendy Poet, LAT, ATC, am serving as scribe for Dr. Lynne Leader.   HPI: Pt is a 60 y/o female c/o chronic R knee pain. She reports having a horse accident when she was a child and has a hx of patellar dislocations/subluxations and bilat knee surgeries. Her last R patellar dislocation was Nov 2021.  Pt locates pain to her R lateral knee.  She works as a Charity fundraiser at [employer].  Patient was referred by Dr. Ninfa Linden  R knee swelling: intermittently yes Radiates: No Mechanical symptoms: other than patellar dislocations, no Aggravates: prolonged standing; walking;  Treatments tried: oral anti-inflammatories; Meloxicam; knee brace  Dx imaging: 05/27/21 R knee CT  04/06/21 R knee XR   Past medical history, Surgical history, Family history, Social history, Allergies, and medications have been entered into the medical record, reviewed.   Review of Systems: No new headache, visual changes, nausea, vomiting, diarrhea, constipation, dizziness, abdominal pain, skin rash, fevers, chills, night sweats, weight loss, swollen lymph nodes, body aches, joint swelling, muscle aches, chest pain, shortness of breath, mood changes, visual or auditory hallucinations.   Objective:    Vitals:   06/05/21 1114  BP: 120/78  Pulse: (!) 58  SpO2: 99%   General: Well Developed, well nourished, and in no acute distress.  Neuro/Psych: Alert and oriented x3, extra-ocular muscles intact, able to move all 4 extremities, sensation grossly intact. Skin: Warm and dry, no rashes noted.  Respiratory: Not using accessory muscles, speaking in full sentences, trachea midline.  Cardiovascular: Pulses palpable, no extremity edema. Abdomen: Does not appear distended. MSK: Right knee decreased quad bulk.  No knee effusion. Normal motion with  crepitation. Mild tender palpation lateral joint line. Stable ligamentous exam. Positive lateral McMurray's test. Intact strength. Mild positive lateral patellar apprehension test.   Lab and Radiology Results  Diagnostic Limited MSK Ultrasound of: Right knee Quad tendon intact normal-appearing Small joint effusion present superior patellar space. Patellar tendon normal. Lateral joint line: Tear present lateral meniscus with partially extruded lateral meniscus worse at the posterior aspect of the lateral meniscus..  Intact appearing LCL. Medial joint line: Relatively intact appearing medial meniscus with intact.  MCL.  Chondrocalcinosis visible deep portion of medial meniscus. Posterior knee: Small Baker's cyst present measuring approximately 1 x 0.6 cm Medial patellar femoral area.  MPFL appears to be intact however in MSK ultrasound is not a good diagnostic tool for this diagnosis. Impression: Partially extruded lateral meniscus tear.  Small Baker's cyst.  EXAM: CT OF THE RIGHT KNEE WITHOUT CONTRAST   TECHNIQUE: Multidetector CT imaging of the right knee was performed according to the standard protocol. Multiplanar CT image reconstructions were also generated.   COMPARISON:  Right knee x-rays dated April 06, 2021.   FINDINGS: Bones/Joint/Cartilage   No fracture or dislocation. Very mild medial and lateral compartment joint space narrowing. Mild lateral patellofemoral compartment joint space narrowing. No joint effusion. Tiny Baker cyst. Chondrocalcinosis of the menisci.   Ligaments   Ligaments are suboptimally evaluated by CT.   Muscles and Tendons Grossly intact.   Soft tissue No fluid collection or hematoma.  No soft tissue mass.   IMPRESSION: 1. Mild right knee osteoarthritis. No acute osseous abnormality. 2. 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     Electronically Signed   By:  Titus Dubin M.D.   On: 05/27/2021  12:04     I, Lynne Leader, personally (independently) visualized and performed the interpretation of the images attached in this note.    Impression and Recommendations:    Assessment and Plan: 60 y.o. female with right knee pain multifactorial. 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  However more acutely she squatted down and had a acute pain in the posterior lateral aspect of her knee that on ultrasound examination today is probably due to the lateral meniscus tear seen.  She has had good trial of treatment so far with Dr. Ninfa Linden with a steroid injection of which did help approximately 6 weeks ago.  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   She was sent to me today for diagnostic ultrasound evaluation and other potential conservative management options.  My impression on MSK ultrasound is lateral meniscus tear and small Baker's cyst.  Discussed treatment options with [facility].  Would recommend physical therapy for quad strengthening which may help her overall knee pain and may also help her patellar stabilization.  Also would recommend PT for potential Kinesiotape which may help the patellar stabilization.  Additionally recommend patellar stabilization brace such as DonJoy Tru pull lite which may help as well. Additionally recommend Voltaren gel  If not improving could consider hyaluronic acid injections.  PRP is an option in general for situations like these however this is still somewhat experimental.  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  March 2021 at 98 so I do not know how well PRP would work in her situation.  Additionally if not improving surgery is certainly a reasonable option.  We will discuss with Dr. Ninfa Linden and proceed with further options.  Recheck in 6 weeks.  PDMP not reviewed this encounter. Orders Placed This Encounter  Procedures   [place]  LIMITED JOINT SPACE STRUCTURES LOW RIGHT(NO LINKED CHARGES)    Order Specific Question:   Reason for Exam (SYMPTOM  OR DIAGNOSIS REQUIRED)    Answer:   R knee pain    Order Specific Question:   Preferred imaging location?    Answer:   [facility]   Ambulatory referral to Physical Therapy    Referral Priority:   Routine    Referral Type:   Physical Medicine    Referral Reason:   Specialty Services Required    Requested Specialty:   Physical Therapy    Number of Visits Requested:   1   No orders of the defined types were placed in this encounter.   Discussed warning signs or symptoms. Please see discharge instructions. Patient expresses understanding.   The above documentation has been reviewed and is accurate and complete Lynne Leader, M.D.

## 2021-06-05 ENCOUNTER — Encounter: Payer: Self-pay | Admitting: Family Medicine

## 2021-06-05 ENCOUNTER — Ambulatory Visit: Payer: Self-pay

## 2021-06-05 ENCOUNTER — Ambulatory Visit (INDEPENDENT_AMBULATORY_CARE_PROVIDER_SITE_OTHER): Payer: 59 | Admitting: Family Medicine

## 2021-06-05 ENCOUNTER — Ambulatory Visit (INDEPENDENT_AMBULATORY_CARE_PROVIDER_SITE_OTHER): Payer: 59 | Admitting: Sports Medicine

## 2021-06-05 ENCOUNTER — Encounter: Payer: Self-pay | Admitting: Sports Medicine

## 2021-06-05 ENCOUNTER — Other Ambulatory Visit: Payer: Self-pay

## 2021-06-05 VITALS — BP 120/78 | HR 58 | Ht 62.0 in | Wt 135.8 lb

## 2021-06-05 DIAGNOSIS — M25561 Pain in right knee: Secondary | ICD-10-CM

## 2021-06-05 DIAGNOSIS — M79674 Pain in right toe(s): Secondary | ICD-10-CM | POA: Diagnosis not present

## 2021-06-05 DIAGNOSIS — S83281A Other tear of lateral meniscus, current injury, right knee, initial encounter: Secondary | ICD-10-CM | POA: Diagnosis not present

## 2021-06-05 DIAGNOSIS — M79675 Pain in left toe(s): Secondary | ICD-10-CM | POA: Diagnosis not present

## 2021-06-05 DIAGNOSIS — L600 Ingrowing nail: Secondary | ICD-10-CM | POA: Diagnosis not present

## 2021-06-05 DIAGNOSIS — R1011 Right upper quadrant pain: Secondary | ICD-10-CM | POA: Insufficient documentation

## 2021-06-05 DIAGNOSIS — S83004S Unspecified dislocation of right patella, sequela: Secondary | ICD-10-CM | POA: Diagnosis not present

## 2021-06-05 DIAGNOSIS — Z8379 Family history of other diseases of the digestive system: Secondary | ICD-10-CM | POA: Insufficient documentation

## 2021-06-05 DIAGNOSIS — K5900 Constipation, unspecified: Secondary | ICD-10-CM | POA: Insufficient documentation

## 2021-06-05 DIAGNOSIS — H9202 Otalgia, left ear: Secondary | ICD-10-CM | POA: Insufficient documentation

## 2021-06-05 DIAGNOSIS — K5730 Diverticulosis of large intestine without perforation or abscess without bleeding: Secondary | ICD-10-CM | POA: Insufficient documentation

## 2021-06-05 DIAGNOSIS — R110 Nausea: Secondary | ICD-10-CM | POA: Insufficient documentation

## 2021-06-05 NOTE — Patient Instructions (Signed)
Thank you for coming in today.   I will make sure Dr Ninfa Linden gets my note and we talk about options.   Lets try PT and the knee brace.   DonJoy Tru-Pull Lite  Recheck with me in 6 weeks or with Dr Ninfa Linden if surgery is the plan.   I do think you have a meniscus tear that is causing a problem.

## 2021-06-05 NOTE — Progress Notes (Signed)
Subjective: Christine Brady is a 60 y.o. female patient presents to office today complaining of a moderately painful incurvated, red, hot, swollen lateral greater than medial nail borders of both big toes.  This has been present for years off and on.  Patient has treated this by self trimming.  Patient denies fever/chills/nausea/vomitting/any other related constitutional symptoms at this time.  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   Tympanic membrane central perforation, 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   Unspecified cholesteatoma, left ear 09/28/2019   IBS (irritable bowel syndrome) 07/09/2019   Headache 07/09/2019   GERD (gastroesophageal reflux disease) 07/09/2019   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 90 capsule 0   fluorouracil (EFUDEX) 5 % cream fluorouracil 5 % topical  cream  APPLY TOPICALLY ONTO THE SKIN ONCE A DAY FOR 21 DAYS AS DIRECTED     gabapentin (NEURONTIN) 100 MG capsule Take by mouth.     linaclotide (LINZESS) 290 MCG CAPS capsule 1 cap(s)     meclizine (ANTIVERT) 25 MG tablet Take 1 tablet by mouth every 6- 8 hours  as needed for imbalance     methylPREDNISolone (MEDROL) 4 MG tablet See admin instructions.     omeprazole (PRILOSEC) 40 MG capsule 1 cap(s)     Plecanatide (TRULANCE) 3 MG TABS 1 tab(s)     progesterone (PROMETRIUM) 100 MG capsule progesterone micronized 100 mg capsule  TAKE 1 CAPSULE BY MOUTH ONCE DAILY     AMBULATORY NON FORMULARY MEDICATION Medication Name: Nitroglycerin gel 0.125% to use a pea sized amount per rectum three times daily as needed 30 g 1   Calcium Carbonate-Vitamin D3 600-400 MG-UNIT TABS Take 2 tablets by mouth daily.     [place] Oil-Levomenthol (FDGARD PO) Take 1 capsule by mouth daily.     estradiol (ESTRACE) 1 MG tablet TAKE 1 TABLET BY MOUTH ONCE DAILY 90 tablet 4   estradiol (ESTRACE) 1 MG tablet Take by mouth.     Hyoscyamine Sulfate SL 0.125 MG SUBL Take by mouth.     ibuprofen (ADVIL) 200 MG tablet Take 400 mg by mouth every 6 (six) hours as needed for headache or moderate pain.     Lactobacillus Rhamnosus, GG, (RA PROBIOTIC DIGESTIVE CARE) CAPS Take 1 tablet by mouth daily.     meloxicam (MOBIC) 15 MG tablet Take 1 tablet (15 mg total) by mouth daily. 30 tablet 3   metroNIDAZOLE (FLAGYL)  500 MG tablet metronidazole 500 mg tablet     mupirocin ointment (BACTROBAN) 2 % mupirocin 2 % topical ointment  APPLY TWICE DAILY TO THE INCISION BEHIND THE EAR FOR 7 DAYS     neomycin (MYCIFRADIN) 500 MG tablet neomycin 500 mg tablet     omeprazole (PRILOSEC) 40 MG capsule Take 1 capsule (40 mg total) by mouth daily. 90 capsule 3   ondansetron (ZOFRAN) 4 MG tablet Take 1 tablet by mouth every 8 to 12 hours as needed (Patient taking differently: Take 1 tablet by mouth every 8 to 12 hours as needed) 30 tablet 1    ondansetron (ZOFRAN) 4 MG tablet ondansetron HCl 4 mg tablet  TAKE 1 TABLET BY MOUTH EVERY 8 HOURS AS NEEDED FOR NAUSEA/VOMITING     Peppermint Oil (IBGARD) 90 MG CPCR Take 1 capsule by mouth daily.     polyethylene glycol (MIRALAX / GLYCOLAX) 17 g packet Take 17 g by mouth daily.     polyethylene glycol powder (GLYCOLAX/MIRALAX) 17 GM/SCOOP powder Take by mouth.     PRESCRIPTION MEDICATION Take 1 capsule by mouth daily before breakfast. Ultra Flora Probiotic     progesterone (PROMETRIUM) 100 MG capsule TAKE 1 CAPSULE BY MOUTH ONCE DAILY 90 capsule 4   rosuvastatin (CRESTOR) 5 MG tablet TAKE 1 TABLET BY MOUTH TWICE WEEKLY 90 tablet 0   traMADol (ULTRAM) 50 MG tablet tramadol 50 mg tablet  TAKE 1 TABLET (50 MG TOTAL) BY MOUTH EVERY 6 (SIX) HOURS AS NEEDED FOR UP TO 5 DAYS.     No current facility-administered medications on file prior to visit.    Allergies  Allergen Reactions   Loratadine     Other reaction(s): Unknown   Oxycodone     Skin crawling   Claritin-D [Loratadine-Pseudoephedrine Er] Rash   Codeine Rash    Objective:  There were no vitals filed for this visit.  General: Well developed, nourished, in no acute distress, alert and oriented x3   Dermatology: Skin is warm, dry and supple bilateral.  Right and left hallux nail appears to be moderately incurvated with hyperkeratosis formation at the distal aspects of the medial and lateral nail border. (+) Erythema. (+) Edema. (-) serosanguous  drainage present. The remaining nails appear unremarkable at this time. There are no open sores, lesions or other signs of infection  present.  Vascular: Dorsalis Pedis artery and Posterior Tibial artery pedal pulses are 2/4 bilateral with immedate capillary fill time. Pedal hair growth present. No lower extremity edema.   Neruologic: Grossly intact via light touch bilateral.  Musculoskeletal: Tenderness to palpation of the left hallux medial and lateral nail fold(s). Muscular strength  within normal limits in all groups bilateral.   Assesement and Plan: Problem List Items Addressed This Visit   None Visit Diagnoses     Ingrown nail    -  Primary   Toe pain, bilateral           -Discussed treatment alternatives and plan of care; Explained permanent/temporary nail avulsion and post procedure course to patient. Patient elects for PNA right and left hallux medial and lateral nail borders with use of phenol - After a verbal and written consent, injected 3 ml of a 50:50 mixture of 2% plain  lidocaine and 0.5% plain marcaine in a normal hallux block fashion. Next, a  betadine prep was performed. Anesthesia was tested and found to be appropriate.  The offending right and left hallux medial and lateral nail borders were then incised from  the hyponychium to the epinychium. The offending nail border was removed and cleared from the field. The areas were curretted for any remaining nail or spicules. Phenol application performed and the area was then flushed with alcohol and dressed with antibiotic cream and a dry sterile dressing. -Patient was instructed to leave the dressing intact for today and begin soaking  in a weak solution of betadine or Epsom salt and water tomorrow. Patient was instructed to  soak for 15-20 minutes each day and apply neosporin/corticosporin and a gauze or bandaid dressing each day. -Patient was instructed to monitor the toes for signs of infection and return to office if toe becomes red, hot or swollen. -Advised ice, elevation, and tylenol or motrin if needed for pain.  -Patient is to return in 2 to 3 weeks for follow up care/nail check or sooner if problems arise.  Landis Martins, DPM

## 2021-06-05 NOTE — Patient Instructions (Signed)

## 2021-06-10 ENCOUNTER — Other Ambulatory Visit (HOSPITAL_COMMUNITY): Payer: Self-pay

## 2021-06-10 DIAGNOSIS — Z13220 Encounter for screening for lipoid disorders: Secondary | ICD-10-CM | POA: Diagnosis not present

## 2021-06-10 DIAGNOSIS — Z1331 Encounter for screening for depression: Secondary | ICD-10-CM | POA: Diagnosis not present

## 2021-06-10 DIAGNOSIS — Z0000 Encounter for general adult medical examination without abnormal findings: Secondary | ICD-10-CM | POA: Diagnosis not present

## 2021-06-10 DIAGNOSIS — Z6824 Body mass index (BMI) 24.0-24.9, adult: Secondary | ICD-10-CM | POA: Diagnosis not present

## 2021-06-10 MED ORDER — POLYETHYLENE GLYCOL 3350 17 GM/SCOOP PO POWD
ORAL | 4 refills | Status: DC
  Filled 2021-06-17: qty 1666, 90d supply, fill #0

## 2021-06-16 ENCOUNTER — Other Ambulatory Visit (HOSPITAL_COMMUNITY): Payer: Self-pay

## 2021-06-16 MED FILL — Estradiol Tab 1 MG: ORAL | 90 days supply | Qty: 90 | Fill #0 | Status: AC

## 2021-06-16 MED FILL — Progesterone Cap 100 MG: ORAL | 90 days supply | Qty: 90 | Fill #0 | Status: AC

## 2021-06-17 ENCOUNTER — Other Ambulatory Visit (HOSPITAL_COMMUNITY): Payer: Self-pay

## 2021-06-19 ENCOUNTER — Telehealth: Payer: Self-pay | Admitting: *Deleted

## 2021-06-19 NOTE — Telephone Encounter (Signed)
Patient called and left a message stating that the toenails are red and at times has some puss and it hurts to put shoes and when shoe does put shoes on patient takes ten steps back and is soaking 2 to 3 times a day and I called the patient back and stated to use the betadine instead and only soak once a day and use the band aid and neosporin and to call the office if any concerns or questions. Lattie Haw

## 2021-06-26 ENCOUNTER — Ambulatory Visit (INDEPENDENT_AMBULATORY_CARE_PROVIDER_SITE_OTHER): Payer: 59 | Admitting: Sports Medicine

## 2021-06-26 ENCOUNTER — Other Ambulatory Visit: Payer: Self-pay

## 2021-06-26 ENCOUNTER — Encounter: Payer: Self-pay | Admitting: Sports Medicine

## 2021-06-26 DIAGNOSIS — R531 Weakness: Secondary | ICD-10-CM | POA: Diagnosis not present

## 2021-06-26 DIAGNOSIS — M79674 Pain in right toe(s): Secondary | ICD-10-CM

## 2021-06-26 DIAGNOSIS — M25552 Pain in left hip: Secondary | ICD-10-CM | POA: Diagnosis not present

## 2021-06-26 DIAGNOSIS — M25661 Stiffness of right knee, not elsewhere classified: Secondary | ICD-10-CM | POA: Diagnosis not present

## 2021-06-26 DIAGNOSIS — Z9889 Other specified postprocedural states: Secondary | ICD-10-CM

## 2021-06-26 DIAGNOSIS — M79675 Pain in left toe(s): Secondary | ICD-10-CM

## 2021-06-26 DIAGNOSIS — M25561 Pain in right knee: Secondary | ICD-10-CM | POA: Diagnosis not present

## 2021-06-26 NOTE — Progress Notes (Signed)
Subjective: Christine Brady is a 60 y.o. female patient returns to office today for follow up evaluation after having Right/Left Hallux medial/lateral permanent nail avulsions performed on (06-05-21). Patient has been using betadine and since switching to this from the epsom salt things have finally started to heal. Admits some soreness on the right hallux lateral nail fold. Patient deniesfever/chills/nausea/vomitting/any other related constitutional symptoms at this time.  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   Family history of other diseases of the digestive system 06/05/2021   Nausea 06/05/2021   Otalgia, left 06/05/2021   Right upper quadrant pain 06/05/2021   Acute lateral meniscus tear of right knee 06/05/2021   Patellar dislocation, right, sequela 06/05/2021   Diplacusis of left ear 10/02/2020   History of right mastoidectomy 04/10/2020   Postop check 02/29/2020   Bowel obstruction ([place]) 09/28/2019   Idiopathic thrombocytopenia ([place]) 09/28/2019   Eustachian tube dysfunction, bilateral 09/28/2019   Tinnitus aurium, bilateral 09/28/2019   Tympanic membrane central perforation, 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   Unspecified cholesteatoma, left ear 09/28/2019   IBS (irritable bowel syndrome) 07/09/2019   Headache 07/09/2019   GERD (gastroesophageal reflux disease) 07/09/2019   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 07/03/2019 07/03/2019   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     [place] Oil-Levomenthol (FDGARD PO) Take 1 capsule by mouth daily.     dexlansoprazole (DEXILANT) 60 MG capsule 1 cap(s)     dicyclomine (BENTYL) 10 MG capsule Take 1 capsule (10 mg total) by mouth every 8 (eight) hours as needed for spasms. 90 capsule 0   estradiol (ESTRACE) 1 MG tablet TAKE 1 TABLET BY MOUTH ONCE DAILY 90 tablet 4   estradiol (ESTRACE) 1 MG tablet Take by mouth.     fluorouracil (EFUDEX) 5 % cream fluorouracil 5 % topical cream  APPLY TOPICALLY ONTO THE SKIN ONCE A DAY FOR 21 DAYS AS DIRECTED     Hyoscyamine Sulfate SL 0.125 MG SUBL Take by mouth.     ibuprofen (ADVIL) 200 MG tablet Take 400 mg by mouth every 6 (six) hours as needed for headache or moderate pain.     Lactobacillus Rhamnosus, GG, (RA PROBIOTIC DIGESTIVE CARE) CAPS Take 1 tablet by mouth daily.     linaclotide (LINZESS) 290 MCG CAPS capsule 1 cap(s)     meclizine (ANTIVERT) 25 MG tablet Take 1 tablet by mouth every 6- 8 hours  as needed for imbalance (Patient not taking: Reported on 06/05/2021)     meloxicam (MOBIC) 15 MG tablet Take 1 tablet (15 mg total) by mouth daily. (Patient not taking: Reported on 06/05/2021) 30 tablet 3   metroNIDAZOLE (FLAGYL) 500 MG tablet metronidazole 500 mg tablet     mupirocin ointment (BACTROBAN) 2 % mupirocin 2 % topical ointment  APPLY TWICE DAILY TO THE INCISION BEHIND THE EAR FOR 7 DAYS     neomycin (MYCIFRADIN) 500  MG tablet neomycin 500 mg tablet     omeprazole (PRILOSEC) 40 MG capsule Take 1 capsule (40 mg total) by mouth daily. 90 capsule 3   omeprazole (PRILOSEC) 40 MG capsule 1 cap(s)     ondansetron (ZOFRAN) 4 MG tablet Take 1 tablet by mouth every 8 to 12 hours as needed (Patient not taking: Reported on 06/05/2021) 30 tablet 1   ondansetron (ZOFRAN) 4 MG tablet ondansetron HCl 4 mg tablet  TAKE 1 TABLET BY MOUTH EVERY 8 HOURS AS NEEDED FOR  NAUSEA/VOMITING (Patient not taking: Reported on 06/05/2021)     Peppermint Oil (IBGARD) 90 MG CPCR Take 1 capsule by mouth daily.     Plecanatide (TRULANCE) 3 MG TABS 1 tab(s)     polyethylene glycol (MIRALAX / GLYCOLAX) 17 g packet Take 17 g by mouth daily.     polyethylene glycol powder (GLYCOLAX/MIRALAX) 17 GM/SCOOP powder Take by mouth.     polyethylene glycol powder (GLYCOLAX/MIRALAX) 17 GM/SCOOP powder Take 1 scoop mixed in 8 oz of fluid daily 1530 g 4   PRESCRIPTION MEDICATION Take 1 capsule by mouth daily before breakfast. Ultra Flora Probiotic     progesterone (PROMETRIUM) 100 MG capsule TAKE 1 CAPSULE BY MOUTH ONCE DAILY 90 capsule 4   progesterone (PROMETRIUM) 100 MG capsule progesterone micronized 100 mg capsule  TAKE 1 CAPSULE BY MOUTH ONCE DAILY     rosuvastatin (CRESTOR) 5 MG tablet TAKE 1 TABLET BY MOUTH TWICE WEEKLY 90 tablet 0   traMADol (ULTRAM) 50 MG tablet tramadol 50 mg tablet  TAKE 1 TABLET (50 MG TOTAL) BY MOUTH EVERY 6 (SIX) HOURS AS NEEDED FOR UP TO 5 DAYS. (Patient not taking: Reported on 06/05/2021)     No current facility-administered medications on file prior to visit.    Allergies  Allergen Reactions   Loratadine     Other reaction(s): Unknown   Oxycodone     Skin crawling   Claritin-D [Loratadine-Pseudoephedrine Er] Rash   Codeine Rash    Objective:  General: Well developed, nourished, in no acute distress, alert and oriented x3   Dermatology: Skin is warm, dry and supple bilateral. Right and Left hallux medial/lateral nail bed appears to be clean, dry, with mild granular tissue and surrounding eschar/scab. Minimal faint Erythema. (-) Edema. (-) serosanguous drainage present. The remaining nails appear unremarkable at this time. There are no other lesions or other signs of infection  present.  Neurovascular status: Intact. No lower extremity swelling; No pain with calf compression bilateral.  Musculoskeletal: Decreased tenderness to palpation of the  Right and left hallux nail fold(s). Muscular strength within normal limits bilateral.   Assesement and Plan: Problem List Items Addressed This Visit   None Visit Diagnoses     S/P nail surgery    -  Primary   Toe pain, bilateral           -Examined patient  -Applied betadine to  right/left hallux medial/lateral nail folds covered with bandaid.  -Discussed plan of care with patient. -Patient to continue with betadine and advised her that she can also soak with betadine as well -Educated patient on long term care after nail surgery. -Patient was instructed to monitor the toe for reoccurrence and signs of infection; Patient advised to return to office or go to ER if toe becomes red, hot or swollen. -Patient is to return as needed or sooner if problems arise.  Landis Martins, DPM

## 2021-07-06 DIAGNOSIS — R531 Weakness: Secondary | ICD-10-CM | POA: Diagnosis not present

## 2021-07-06 DIAGNOSIS — M25561 Pain in right knee: Secondary | ICD-10-CM | POA: Diagnosis not present

## 2021-07-06 DIAGNOSIS — M25552 Pain in left hip: Secondary | ICD-10-CM | POA: Diagnosis not present

## 2021-07-06 DIAGNOSIS — M25661 Stiffness of right knee, not elsewhere classified: Secondary | ICD-10-CM | POA: Diagnosis not present

## 2021-07-09 ENCOUNTER — Other Ambulatory Visit (HOSPITAL_COMMUNITY): Payer: Self-pay

## 2021-07-09 DIAGNOSIS — M25661 Stiffness of right knee, not elsewhere classified: Secondary | ICD-10-CM | POA: Diagnosis not present

## 2021-07-09 DIAGNOSIS — D693 Immune thrombocytopenic purpura: Secondary | ICD-10-CM | POA: Diagnosis not present

## 2021-07-09 DIAGNOSIS — M25552 Pain in left hip: Secondary | ICD-10-CM | POA: Diagnosis not present

## 2021-07-09 DIAGNOSIS — H93222 Diplacusis, left ear: Secondary | ICD-10-CM | POA: Diagnosis not present

## 2021-07-09 DIAGNOSIS — R531 Weakness: Secondary | ICD-10-CM | POA: Diagnosis not present

## 2021-07-09 DIAGNOSIS — H900 Conductive hearing loss, bilateral: Secondary | ICD-10-CM | POA: Diagnosis not present

## 2021-07-09 DIAGNOSIS — H6983 Other specified disorders of Eustachian tube, bilateral: Secondary | ICD-10-CM | POA: Diagnosis not present

## 2021-07-09 DIAGNOSIS — Z48810 Encounter for surgical aftercare following surgery on the sense organs: Secondary | ICD-10-CM | POA: Diagnosis not present

## 2021-07-09 DIAGNOSIS — H6993 Unspecified Eustachian tube disorder, bilateral: Secondary | ICD-10-CM | POA: Diagnosis not present

## 2021-07-09 DIAGNOSIS — Z9621 Cochlear implant status: Secondary | ICD-10-CM | POA: Diagnosis not present

## 2021-07-09 DIAGNOSIS — Z885 Allergy status to narcotic agent status: Secondary | ICD-10-CM | POA: Diagnosis not present

## 2021-07-09 DIAGNOSIS — Z9089 Acquired absence of other organs: Secondary | ICD-10-CM | POA: Diagnosis not present

## 2021-07-09 DIAGNOSIS — M25561 Pain in right knee: Secondary | ICD-10-CM | POA: Diagnosis not present

## 2021-07-09 DIAGNOSIS — Z888 Allergy status to other drugs, medicaments and biological substances status: Secondary | ICD-10-CM | POA: Diagnosis not present

## 2021-07-09 MED ORDER — CYCLOBENZAPRINE HCL 5 MG PO TABS
ORAL_TABLET | ORAL | 1 refills | Status: DC
  Filled 2021-07-09: qty 30, 30d supply, fill #0
  Filled 2021-08-19: qty 30, 30d supply, fill #1

## 2021-07-16 ENCOUNTER — Ambulatory Visit (INDEPENDENT_AMBULATORY_CARE_PROVIDER_SITE_OTHER): Payer: 59 | Admitting: Physician Assistant

## 2021-07-16 ENCOUNTER — Encounter: Payer: Self-pay | Admitting: Physician Assistant

## 2021-07-16 ENCOUNTER — Ambulatory Visit: Payer: 59 | Admitting: Physician Assistant

## 2021-07-16 ENCOUNTER — Other Ambulatory Visit: Payer: Self-pay

## 2021-07-16 DIAGNOSIS — S83004S Unspecified dislocation of right patella, sequela: Secondary | ICD-10-CM

## 2021-07-16 DIAGNOSIS — S83281D Other tear of lateral meniscus, current injury, right knee, subsequent encounter: Secondary | ICD-10-CM

## 2021-07-16 NOTE — Progress Notes (Signed)
HPI: Christine Brady returns today for follow-up of her right knee pain.  She was last seen by Dr. Ninfa Linden 04/06/2021 due to recurrent patellar dislocations.  She states that she is having significant pain in the right knee mainly lateral aspect of the knee.  She also notes that the patella dislocates several times per week.  Reports right knee arthroscopy 1988 which she describes as torn cartilage.  She was unable to undergo an MRI due to metal implant in her body.  She underwent an ultrasound of the right knee which showed a tear lateral meniscus with partial extrusion of the lateral meniscus.  Medial joint line showed the medial meniscus to be intact.  Small Baker's cyst was seen.  Medial patellofemoral condyle region the medial patellofemoral ligament appeared to be intact however ultrasound not felt to be the best diagnostic tool for this.  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 She has tried cortisone injections in the knee tried physical therapy has tried knee brace none of these have given her any real relief.  She denies any giving way, locking of the right knee.  She does have occasional painful popping.  Review of systems: See HPI otherwise negative  Physical exam General well-developed well-nourished female no acute distress mood and affect appropriate.  Psych alert and oriented x3.  Bilateral knees: No abnormal warmth erythema fusion.  She has hypermobile right patella.  Positive apprehension test.  Otherwise good range of motion of both knees.  Otherwise overall good range of motion both knees.  Impression: Chronic right knee pain Right patella dislocation  Plan: Given patient's failed conservative treatments recommend that she be seen by Dr. Marlou Sa for possible intervention of the knee due to her chronic right knee dislocations and given the ultrasound finding of the  lateral meniscal tear.  We have made an appointment with Dr. Marlou Sa for next Thursday at 9 AM.  Questions were encouraged and answered at length.

## 2021-07-17 ENCOUNTER — Ambulatory Visit: Payer: 59 | Admitting: Family Medicine

## 2021-07-21 ENCOUNTER — Other Ambulatory Visit (HOSPITAL_COMMUNITY): Payer: Self-pay

## 2021-07-21 MED FILL — Rosuvastatin Calcium Tab 5 MG: ORAL | 84 days supply | Qty: 24 | Fill #1 | Status: AC

## 2021-07-23 ENCOUNTER — Encounter: Payer: Self-pay | Admitting: Orthopedic Surgery

## 2021-07-23 ENCOUNTER — Ambulatory Visit (INDEPENDENT_AMBULATORY_CARE_PROVIDER_SITE_OTHER): Payer: 59 | Admitting: Orthopedic Surgery

## 2021-07-23 DIAGNOSIS — S83004S Unspecified dislocation of right patella, sequela: Secondary | ICD-10-CM

## 2021-07-23 DIAGNOSIS — M25561 Pain in right knee: Secondary | ICD-10-CM | POA: Diagnosis not present

## 2021-07-25 ENCOUNTER — Encounter: Payer: Self-pay | Admitting: Orthopedic Surgery

## 2021-07-25 NOTE — Progress Notes (Signed)
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  She is had pain for over a year as well as dislocation events with subluxation and dislocation about every other day.  Increase started in November.  No definite history of injury.  Takes Mobic as needed.  Has a history of idiopathic thrombocytopenic problems and her platelets usually run in the 90,000 range.  No personal or family history of DVT.  She is having some back issues with volvulus problems and had surgery in July 2020 she has 2 stairs at her house and has the support of her neighbors but no family that lives with her.  She has no history of left knee patellar dislocation.  Her patella on the right-hand side dislocated in November 2021 at [facility] and it has been dislocating significantly since that time.  She has had a CT scan which does not show unfavorable tibial tubercle trochlear groove distance and only mild degenerative changes within the patellofemoral joint.  Suggestion of lateral meniscus tear is present based on CT scan.              ROS: All systems reviewed are negative as they relate to the chief complaint within the history of present illness.  Patient denies  fevers or chills.   Assessment & Plan: Visit Diagnoses:  1. Patellar dislocation, right, sequela     Plan: Impression is right knee pain with patellar instability and possible lateral meniscal tear.  Plan is right knee arthroscopy with meniscal debridement and open medial patellofemoral ligament reconstruction using allograft tendon.  Risk and benefits are discussed including not limited to infection nerve  vessel damage knee stiffness knee pain over constraint of the patella along with under constraint and recurrent instability.  Patient understands risk benefits and the rehabilitation required.  All questions answered.  Would like her platelet count 90,000 or above for surgery.  Follow-Up Instructions: No follow-ups on file.   Orders:  No orders of the defined types were placed in this encounter.  No orders of the defined types were placed in this encounter.     Procedures: No procedures performed   Clinical Data: No additional findings.  Objective: Vital Signs: There were no vitals taken for this visit.  Physical Exam:   Constitutional: Patient appears well-developed HEENT:  Head: Normocephalic Eyes:EOM are normal Neck: Normal range of motion Cardiovascular: Normal rate Pulmonary/chest: Effort normal Neurologic: Patient is alert Skin: Skin is warm Psychiatric: Patient has normal mood and affect   Ortho Exam: Ortho exam demonstrates normal gait alignment.  She has pretty reasonable quad strength bilaterally.  Positive patellar apprehension on the right negative on left.  She has about 2 cm of lateral mobility to 2-1/2 cm with lateral mobility both in extension and flexion.  Endpoint is soft on the right firmer on the left.  Collateral and cruciate ligaments are stable.  Lateral joint line tenderness is present on the right-hand side.  Pedal pulses palpable with intact ankle dorsiflexion strength.  Specialty Comments:  No specialty comments available.  Imaging: No results found.   PMFS History: 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   Family history of other diseases of the digestive system 06/05/2021   Nausea 06/05/2021   Otalgia, left 06/05/2021   Right upper quadrant pain 06/05/2021   Acute lateral meniscus tear of right knee 06/05/2021   Patellar dislocation, right, sequela  06/05/2021   Diplacusis of left ear 10/02/2020   History of right mastoidectomy 04/10/2020   Postop check 02/29/2020   Bowel obstruction ([place]) 09/28/2019   Idiopathic thrombocytopenia ([place]) 09/28/2019   Eustachian tube dysfunction, bilateral 09/28/2019   Tinnitus aurium, bilateral 09/28/2019   Tympanic membrane central perforation, 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   Unspecified cholesteatoma, left ear 09/28/2019   IBS (irritable bowel syndrome) 07/09/2019   Headache 07/09/2019   GERD (gastroesophageal reflux disease) 07/09/2019   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 07/03/2019 07/03/2019   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   Menopausal syndrome 03/13/2018   History of postoperative nausea 01/19/2017   Atypical chest pain 11/11/2016   Hyperlipidemia 11/11/2016   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[place]) 11/11/2016   Epidermoid cyst on back 10/28/2011   Past Medical History:  Diagnosis Date   Abdominal pain    Arthritis    difficulty walking   Atypical chest pain 11/11/2016   Bowel obstruction ([facility])    Cholesteatoma of right ear    Chronic headaches    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[place]) 11/11/2016   Cyst    back   Hearing aid worn    History of ITP    Hyperlipidemia 11/11/2016   Large ovary 07/09/2019   PONV (postoperative nausea and vomiting)    Reflux     Family History  Problem Relation Age of Onset   Diabetes Mother    Asthma Mother    Diabetes type II Mother    Alcohol abuse Mother    Hypertension Mother    Diabetes Father    Hypertension Father    Kidney disease Father    Diabetes type II Father    Alcohol abuse Father    Crohn's disease Sister    Cerebral palsy Brother    COPD Sister    Thrombocytopenia Sister     Past Surgical History:  Procedure Laterality Date   CHOLECYSTECTOMY  2010   ESOPHAGOGASTRODUODENOSCOPY (EGD) WITH PROPOFOL N/A 06/12/2019   Procedure: ESOPHAGOGASTRODUODENOSCOPY (EGD) WITH PROPOFOL;  Surgeon: Carol Ada, MD;   Location: [facility];  Service: Endoscopy;  Laterality: N/A;   EXTERNAL EAR SURGERY  2009/2010   EXTERNAL EAR SURGERY  2010   right ear   KNEE ARTHROSCOPY     right   KNEE LIGAMENT RECONSTRUCTION     left   MASTOIDECTOMY Right 01/19/2017   Procedure: RIGHT MODIFIED RADICAL MASTOIDECTOMY;  Surgeon: Vicie Mutters, MD;  Location: [place];  Service: ENT;  Laterality: Right;   UPPER ESOPHAGEAL ENDOSCOPIC ULTRASOUND (EUS) N/A 06/12/2019   Procedure: UPPER ESOPHAGEAL ENDOSCOPIC ULTRASOUND (EUS);  Surgeon: Carol Ada, MD;  Location: [facility];  Service: Endoscopy;  Laterality: N/A;   WISDOM TOOTH EXTRACTION     Social History   Occupational History   Not on file  Tobacco Use   Smoking status: Never   Smokeless tobacco: Never  Vaping Use   Vaping Use: Never used  Substance and Sexual Activity   Alcohol use: Yes    Comment: occas   Drug use: No   Sexual activity: Not on  file

## 2021-08-13 ENCOUNTER — Telehealth: Payer: Self-pay | Admitting: Orthopedic Surgery

## 2021-08-13 DIAGNOSIS — S83281D Other tear of lateral meniscus, current injury, right knee, subsequent encounter: Secondary | ICD-10-CM

## 2021-08-13 NOTE — Telephone Encounter (Signed)
Patient is scheduled for right knee arthroscopy, debridement, lateral meniscal tear at [facility] on 10-05-21.  She has a history of being very sick after general anesthesia and would like to discuss a preventative treatment.  In the past the anesthesiologist had suggested a patch and a certain prescription or cocktail.    Please call patient at  [phone]

## 2021-08-18 NOTE — Telephone Encounter (Signed)
Ok for scopolomine patch  apply am of surgery and zofran 4 mg po q 8 prn nausea # 30 pls send in and call thx

## 2021-08-19 ENCOUNTER — Other Ambulatory Visit (HOSPITAL_COMMUNITY): Payer: Self-pay

## 2021-08-19 MED ORDER — ONDANSETRON HCL 4 MG PO TABS
4.0000 mg | ORAL_TABLET | Freq: Three times a day (TID) | ORAL | 0 refills | Status: DC | PRN
  Filled 2021-08-19: qty 30, 10d supply, fill #0

## 2021-08-19 NOTE — Telephone Encounter (Signed)
Contacted patient and made her aware.

## 2021-08-20 ENCOUNTER — Other Ambulatory Visit (HOSPITAL_COMMUNITY): Payer: Self-pay

## 2021-08-29 ENCOUNTER — Other Ambulatory Visit (HOSPITAL_COMMUNITY): Payer: Self-pay

## 2021-08-29 MED ORDER — METHOCARBAMOL 500 MG PO TABS
ORAL_TABLET | ORAL | 2 refills | Status: DC
  Filled 2021-08-29: qty 21, 7d supply, fill #0

## 2021-09-01 ENCOUNTER — Other Ambulatory Visit (HOSPITAL_COMMUNITY): Payer: Self-pay

## 2021-09-01 DIAGNOSIS — L814 Other melanin hyperpigmentation: Secondary | ICD-10-CM | POA: Diagnosis not present

## 2021-09-01 DIAGNOSIS — Z411 Encounter for cosmetic surgery: Secondary | ICD-10-CM | POA: Diagnosis not present

## 2021-09-01 DIAGNOSIS — L570 Actinic keratosis: Secondary | ICD-10-CM | POA: Diagnosis not present

## 2021-09-01 DIAGNOSIS — D2272 Melanocytic nevi of left lower limb, including hip: Secondary | ICD-10-CM | POA: Diagnosis not present

## 2021-09-01 DIAGNOSIS — L578 Other skin changes due to chronic exposure to nonionizing radiation: Secondary | ICD-10-CM | POA: Diagnosis not present

## 2021-09-01 DIAGNOSIS — D225 Melanocytic nevi of trunk: Secondary | ICD-10-CM | POA: Diagnosis not present

## 2021-09-01 DIAGNOSIS — L821 Other seborrheic keratosis: Secondary | ICD-10-CM | POA: Diagnosis not present

## 2021-09-01 DIAGNOSIS — Z85828 Personal history of other malignant neoplasm of skin: Secondary | ICD-10-CM | POA: Diagnosis not present

## 2021-09-01 MED ORDER — HYDROQUINONE 4 % EX CREA: TOPICAL_CREAM | CUTANEOUS | 0 refills | Status: DC

## 2021-09-01 MED ORDER — HYDROQUINONE 4 % EX CREA
TOPICAL_CREAM | CUTANEOUS | 0 refills | Status: DC
  Filled 2021-09-01: qty 28.35, 60d supply, fill #0

## 2021-09-01 MED ORDER — DICLOFENAC SODIUM 3 % EX GEL
CUTANEOUS | 0 refills | Status: DC
  Filled 2021-09-01: qty 100, 90d supply, fill #0

## 2021-09-02 ENCOUNTER — Other Ambulatory Visit (HOSPITAL_COMMUNITY): Payer: Self-pay

## 2021-09-15 ENCOUNTER — Telehealth: Payer: Self-pay | Admitting: Orthopedic Surgery

## 2021-09-15 ENCOUNTER — Other Ambulatory Visit (HOSPITAL_COMMUNITY): Payer: Self-pay

## 2021-09-15 NOTE — Telephone Encounter (Signed)
[employer] forms received. To [employer]. 

## 2021-09-16 ENCOUNTER — Other Ambulatory Visit (HOSPITAL_COMMUNITY): Payer: Self-pay

## 2021-09-16 MED ORDER — PROGESTERONE 200 MG PO CAPS
ORAL_CAPSULE | ORAL | 0 refills | Status: DC
  Filled 2021-09-16: qty 90, 90d supply, fill #0

## 2021-09-16 MED ORDER — ESTRADIOL 1 MG PO TABS
1.0000 mg | ORAL_TABLET | Freq: Every day | ORAL | 0 refills | Status: DC
  Filled 2021-09-16: qty 90, 90d supply, fill #0

## 2021-09-18 ENCOUNTER — Telehealth: Payer: Self-pay | Admitting: Orthopedic Surgery

## 2021-09-18 ENCOUNTER — Other Ambulatory Visit: Payer: Self-pay

## 2021-09-18 NOTE — Telephone Encounter (Signed)
Patient is scheduled for right knee arthroscopy, debridement lateral meniscal tear at [facility] on 10-05-21 with Dr. Marlou Sa.  She is asking what her down time will be.  She states she has sent paperwork to be completed (FMLA) and would like to know if this has been received.  I gave patient the direct line for [employer].  Post op appointment is scheduled for 10/12/21.   Please call patient at [phone] to provide estimated down time.

## 2021-09-18 NOTE — Telephone Encounter (Signed)
Likely out of work for 4-6 weeks unless she is itching to get back and then we can discuss returning further.  If she needs more time after procedure, could be up to 2-3 months but this is unlikely

## 2021-09-21 ENCOUNTER — Telehealth: Payer: Self-pay | Admitting: Orthopedic Surgery

## 2021-09-21 NOTE — Telephone Encounter (Signed)
Pt submitted medical release form, FMLA form, and $25.00 cash payment to [employer]. Accepted 09/21/21

## 2021-09-21 NOTE — Telephone Encounter (Signed)
Contacted patient and made her aware. She understood and had no further questions or concerns.

## 2021-09-23 ENCOUNTER — Other Ambulatory Visit (HOSPITAL_COMMUNITY): Payer: Self-pay

## 2021-09-23 DIAGNOSIS — Z6824 Body mass index (BMI) 24.0-24.9, adult: Secondary | ICD-10-CM | POA: Diagnosis not present

## 2021-09-23 DIAGNOSIS — Z1231 Encounter for screening mammogram for malignant neoplasm of breast: Secondary | ICD-10-CM | POA: Diagnosis not present

## 2021-09-23 DIAGNOSIS — Z01419 Encounter for gynecological examination (general) (routine) without abnormal findings: Secondary | ICD-10-CM | POA: Diagnosis not present

## 2021-09-23 DIAGNOSIS — K562 Volvulus: Secondary | ICD-10-CM | POA: Diagnosis not present

## 2021-09-23 DIAGNOSIS — Z79890 Hormone replacement therapy (postmenopausal): Secondary | ICD-10-CM | POA: Diagnosis not present

## 2021-09-23 MED ORDER — PROGESTERONE MICRONIZED 100 MG PO CAPS
ORAL_CAPSULE | ORAL | 1 refills | Status: DC
  Filled 2021-09-23: qty 90, 90d supply, fill #0

## 2021-09-23 MED ORDER — ESTRADIOL 1 MG PO TABS
ORAL_TABLET | ORAL | 1 refills | Status: DC
  Filled 2021-09-23: qty 23, 77d supply, fill #0

## 2021-09-25 ENCOUNTER — Encounter (HOSPITAL_BASED_OUTPATIENT_CLINIC_OR_DEPARTMENT_OTHER): Payer: Self-pay | Admitting: Orthopedic Surgery

## 2021-09-25 ENCOUNTER — Other Ambulatory Visit (HOSPITAL_COMMUNITY)
Admission: RE | Admit: 2021-09-25 | Discharge: 2021-09-25 | Disposition: A | Discharge status: Home / Self Care | Payer: 59 | Source: Ambulatory Visit | Attending: Orthopedic Surgery | Admitting: Orthopedic Surgery

## 2021-09-25 ENCOUNTER — Other Ambulatory Visit: Payer: Self-pay

## 2021-09-25 DIAGNOSIS — Z9629 Presence of other otological and audiological implants: Secondary | ICD-10-CM | POA: Diagnosis not present

## 2021-09-25 DIAGNOSIS — D693 Immune thrombocytopenic purpura: Secondary | ICD-10-CM | POA: Diagnosis not present

## 2021-09-25 DIAGNOSIS — Z9622 Myringotomy tube(s) status: Secondary | ICD-10-CM | POA: Diagnosis not present

## 2021-09-25 DIAGNOSIS — Z01812 Encounter for preprocedural laboratory examination: Secondary | ICD-10-CM | POA: Diagnosis not present

## 2021-09-25 DIAGNOSIS — H73891 Other specified disorders of tympanic membrane, right ear: Secondary | ICD-10-CM | POA: Diagnosis not present

## 2021-09-25 DIAGNOSIS — Z8669 Personal history of other diseases of the nervous system and sense organs: Secondary | ICD-10-CM | POA: Diagnosis not present

## 2021-09-25 DIAGNOSIS — H93222 Diplacusis, left ear: Secondary | ICD-10-CM | POA: Diagnosis not present

## 2021-09-25 DIAGNOSIS — H90A11 Conductive hearing loss, unilateral, right ear with restricted hearing on the contralateral side: Secondary | ICD-10-CM | POA: Diagnosis not present

## 2021-09-25 DIAGNOSIS — H6983 Other specified disorders of Eustachian tube, bilateral: Secondary | ICD-10-CM | POA: Diagnosis not present

## 2021-09-25 DIAGNOSIS — Z9621 Cochlear implant status: Secondary | ICD-10-CM | POA: Diagnosis not present

## 2021-09-25 DIAGNOSIS — H7411 Adhesive right middle ear disease: Secondary | ICD-10-CM | POA: Diagnosis not present

## 2021-09-25 DIAGNOSIS — Z9089 Acquired absence of other organs: Secondary | ICD-10-CM | POA: Diagnosis not present

## 2021-09-25 LAB — BASIC METABOLIC PANEL
Anion gap: 7 (ref 5–15)
BUN: 12 mg/dL (ref 6–20)
CO2: 25 mmol/L (ref 22–32)
Calcium: 9 mg/dL (ref 8.9–10.3)
Chloride: 102 mmol/L (ref 98–111)
Creatinine, Ser: 0.64 mg/dL (ref 0.44–1.00)
GFR, Estimated: >60 mL/min (ref >60)
Glucose, Bld: 99 mg/dL (ref 70–99)
Potassium: 3.9 mmol/L (ref 3.5–5.1)
Sodium: 134 mmol/L — ABNORMAL LOW (ref 135–145)

## 2021-09-25 LAB — CBC
HCT: 35.2 % — ABNORMAL LOW (ref 36.0–46.0)
Hemoglobin: 12 g/dL (ref 12.0–15.0)
MCH: 32.7 pg (ref 26.0–34.0)
MCHC: 34.1 g/dL (ref 30.0–36.0)
MCV: 95.9 fL (ref 80.0–100.0)
Platelets: 97 10*3/uL — ABNORMAL LOW (ref 150–400)
RBC: 3.67 MIL/uL — ABNORMAL LOW (ref 3.87–5.11)
RDW: 12.5 % (ref 11.5–15.5)
WBC: 4.6 10*3/uL (ref 4.0–10.5)
nRBC: 0 % (ref 0.0–0.2)

## 2021-10-05 ENCOUNTER — Encounter (HOSPITAL_BASED_OUTPATIENT_CLINIC_OR_DEPARTMENT_OTHER): Payer: Self-pay | Admitting: Orthopedic Surgery

## 2021-10-05 ENCOUNTER — Other Ambulatory Visit: Payer: Self-pay

## 2021-10-05 ENCOUNTER — Encounter (HOSPITAL_BASED_OUTPATIENT_CLINIC_OR_DEPARTMENT_OTHER)
Admission: RE | Disposition: A | Discharge status: Home / Self Care | Payer: Self-pay | Source: Home / Self Care | Attending: Orthopedic Surgery

## 2021-10-05 ENCOUNTER — Ambulatory Visit (HOSPITAL_BASED_OUTPATIENT_CLINIC_OR_DEPARTMENT_OTHER): Payer: 59 | Admitting: Certified Registered"

## 2021-10-05 ENCOUNTER — Ambulatory Visit (HOSPITAL_BASED_OUTPATIENT_CLINIC_OR_DEPARTMENT_OTHER)
Admission: RE | Admit: 2021-10-05 | Discharge: 2021-10-05 | Disposition: A | Discharge status: Home / Self Care | Payer: 59 | Attending: Orthopedic Surgery | Admitting: Orthopedic Surgery

## 2021-10-05 ENCOUNTER — Ambulatory Visit (HOSPITAL_COMMUNITY): Payer: 59

## 2021-10-05 ENCOUNTER — Other Ambulatory Visit (HOSPITAL_COMMUNITY): Payer: Self-pay

## 2021-10-05 DIAGNOSIS — Z791 Long term (current) use of non-steroidal anti-inflammatories (NSAID): Secondary | ICD-10-CM | POA: Insufficient documentation

## 2021-10-05 DIAGNOSIS — Z888 Allergy status to other drugs, medicaments and biological substances status: Secondary | ICD-10-CM | POA: Diagnosis not present

## 2021-10-05 DIAGNOSIS — M25361 Other instability, right knee: Secondary | ICD-10-CM | POA: Diagnosis not present

## 2021-10-05 DIAGNOSIS — Z79899 Other long term (current) drug therapy: Secondary | ICD-10-CM | POA: Diagnosis not present

## 2021-10-05 DIAGNOSIS — Z885 Allergy status to narcotic agent status: Secondary | ICD-10-CM | POA: Insufficient documentation

## 2021-10-05 DIAGNOSIS — M2341 Loose body in knee, right knee: Secondary | ICD-10-CM

## 2021-10-05 DIAGNOSIS — E785 Hyperlipidemia, unspecified: Secondary | ICD-10-CM | POA: Insufficient documentation

## 2021-10-05 DIAGNOSIS — M1990 Unspecified osteoarthritis, unspecified site: Secondary | ICD-10-CM | POA: Diagnosis not present

## 2021-10-05 DIAGNOSIS — M2351 Chronic instability of knee, right knee: Secondary | ICD-10-CM | POA: Diagnosis not present

## 2021-10-05 DIAGNOSIS — K219 Gastro-esophageal reflux disease without esophagitis: Secondary | ICD-10-CM | POA: Diagnosis not present

## 2021-10-05 DIAGNOSIS — S83281A Other tear of lateral meniscus, current injury, right knee, initial encounter: Secondary | ICD-10-CM | POA: Diagnosis not present

## 2021-10-05 DIAGNOSIS — Z419 Encounter for procedure for purposes other than remedying health state, unspecified: Secondary | ICD-10-CM

## 2021-10-05 DIAGNOSIS — S83271D Complex tear of lateral meniscus, current injury, right knee, subsequent encounter: Secondary | ICD-10-CM

## 2021-10-05 DIAGNOSIS — D693 Immune thrombocytopenic purpura: Secondary | ICD-10-CM | POA: Insufficient documentation

## 2021-10-05 DIAGNOSIS — Z471 Aftercare following joint replacement surgery: Secondary | ICD-10-CM | POA: Diagnosis not present

## 2021-10-05 DIAGNOSIS — Z96651 Presence of right artificial knee joint: Secondary | ICD-10-CM | POA: Diagnosis not present

## 2021-10-05 DIAGNOSIS — X58XXXA Exposure to other specified factors, initial encounter: Secondary | ICD-10-CM | POA: Insufficient documentation

## 2021-10-05 DIAGNOSIS — S83271A Complex tear of lateral meniscus, current injury, right knee, initial encounter: Secondary | ICD-10-CM

## 2021-10-05 DIAGNOSIS — G8918 Other acute postprocedural pain: Secondary | ICD-10-CM | POA: Diagnosis not present

## 2021-10-05 HISTORY — PX: KNEE ARTHROSCOPY WITH MEDIAL PATELLAR FEMORAL LIGAMENT RECONSTRUCTION: SHX5652

## 2021-10-05 SURGERY — REPAIR, TENDON, PATELLAR, ARTHROSCOPIC
Anesthesia: General | Site: Knee | Laterality: Right

## 2021-10-05 MED ORDER — PROPOFOL 10 MG/ML IV BOLUS
INTRAVENOUS | Status: AC
  Filled 2021-10-05: qty 40

## 2021-10-05 MED ORDER — EPHEDRINE SULFATE 50 MG/ML IJ SOLN
INTRAMUSCULAR | Status: DC | PRN
  Administered 2021-10-05: 10 mg via INTRAVENOUS

## 2021-10-05 MED ORDER — PHENYLEPHRINE 40 MCG/ML (10ML) SYRINGE FOR IV PUSH (FOR BLOOD PRESSURE SUPPORT)
PREFILLED_SYRINGE | INTRAVENOUS | Status: AC
  Filled 2021-10-05: qty 10

## 2021-10-05 MED ORDER — PHENYLEPHRINE HCL (PRESSORS) 10 MG/ML IV SOLN
INTRAVENOUS | Status: DC | PRN
  Administered 2021-10-05: 120 ug via INTRAVENOUS
  Administered 2021-10-05: 40 ug via INTRAVENOUS
  Administered 2021-10-05 (×2): 120 ug via INTRAVENOUS

## 2021-10-05 MED ORDER — HYDROMORPHONE HCL 1 MG/ML IJ SOLN
0.2500 mg | INTRAMUSCULAR | Status: DC | PRN
  Administered 2021-10-05 (×3): 0.5 mg via INTRAVENOUS

## 2021-10-05 MED ORDER — DEXAMETHASONE SODIUM PHOSPHATE 10 MG/ML IJ SOLN
INTRAMUSCULAR | Status: DC | PRN
  Administered 2021-10-05: 10 mg via INTRAVENOUS

## 2021-10-05 MED ORDER — ONDANSETRON HCL 4 MG/2ML IJ SOLN
INTRAMUSCULAR | Status: DC | PRN
  Administered 2021-10-05: 4 mg via INTRAVENOUS

## 2021-10-05 MED ORDER — DROPERIDOL 2.5 MG/ML IJ SOLN
INTRAMUSCULAR | Status: DC | PRN
  Administered 2021-10-05: .625 mg via INTRAVENOUS

## 2021-10-05 MED ORDER — DROPERIDOL 2.5 MG/ML IJ SOLN
INTRAMUSCULAR | Status: AC
  Filled 2021-10-05: qty 2

## 2021-10-05 MED ORDER — PROPOFOL 10 MG/ML IV BOLUS
INTRAVENOUS | Status: DC | PRN
  Administered 2021-10-05: 200 mg via INTRAVENOUS

## 2021-10-05 MED ORDER — CEFAZOLIN SODIUM-DEXTROSE 2-4 GM/100ML-% IV SOLN
INTRAVENOUS | Status: AC
  Filled 2021-10-05: qty 100

## 2021-10-05 MED ORDER — MORPHINE SULFATE (PF) 4 MG/ML IV SOLN
INTRAVENOUS | Status: DC | PRN
  Administered 2021-10-05: 8 mg via INTRAVENOUS

## 2021-10-05 MED ORDER — TRANEXAMIC ACID-NACL 1000-0.7 MG/100ML-% IV SOLN
INTRAVENOUS | Status: AC
  Filled 2021-10-05: qty 100

## 2021-10-05 MED ORDER — ACETAMINOPHEN 10 MG/ML IV SOLN
INTRAVENOUS | Status: AC
  Filled 2021-10-05: qty 100

## 2021-10-05 MED ORDER — LACTATED RINGERS IV SOLN: INTRAVENOUS | Status: DC | PRN

## 2021-10-05 MED ORDER — FENTANYL CITRATE (PF) 100 MCG/2ML IJ SOLN
INTRAMUSCULAR | Status: AC
  Filled 2021-10-05: qty 2

## 2021-10-05 MED ORDER — HYDROCODONE-ACETAMINOPHEN 10-325 MG PO TABS
1.0000 | ORAL_TABLET | ORAL | 0 refills | Status: DC | PRN
  Filled 2021-10-05: qty 30, 5d supply, fill #0

## 2021-10-05 MED ORDER — CEFAZOLIN SODIUM-DEXTROSE 2-3 GM-%(50ML) IV SOLR
INTRAVENOUS | Status: DC | PRN
  Administered 2021-10-05: 2 g via INTRAVENOUS

## 2021-10-05 MED ORDER — HYDROMORPHONE HCL 1 MG/ML IJ SOLN
INTRAMUSCULAR | Status: AC
  Filled 2021-10-05: qty 0.5

## 2021-10-05 MED ORDER — LIDOCAINE HCL (CARDIAC) PF 100 MG/5ML IV SOSY
PREFILLED_SYRINGE | INTRAVENOUS | Status: DC | PRN
  Administered 2021-10-05: 100 mg via INTRAVENOUS

## 2021-10-05 MED ORDER — PROMETHAZINE HCL 25 MG/ML IJ SOLN: 6.2500 mg | INTRAMUSCULAR | Status: DC | PRN

## 2021-10-05 MED ORDER — FENTANYL CITRATE (PF) 250 MCG/5ML IJ SOLN
INTRAMUSCULAR | Status: DC | PRN
  Administered 2021-10-05: 25 ug via INTRAVENOUS

## 2021-10-05 MED ORDER — CLONIDINE HCL (ANALGESIA) 100 MCG/ML EP SOLN
EPIDURAL | Status: DC | PRN
  Administered 2021-10-05: .75 mL

## 2021-10-05 MED ORDER — CLONIDINE HCL (ANALGESIA) 100 MCG/ML EP SOLN
EPIDURAL | Status: AC
  Filled 2021-10-05: qty 10

## 2021-10-05 MED ORDER — MORPHINE SULFATE (PF) 4 MG/ML IV SOLN
INTRAVENOUS | Status: AC
  Filled 2021-10-05: qty 2

## 2021-10-05 MED ORDER — MIDAZOLAM HCL 2 MG/2ML IJ SOLN
INTRAMUSCULAR | Status: AC
  Filled 2021-10-05: qty 2

## 2021-10-05 MED ORDER — ACETAMINOPHEN 10 MG/ML IV SOLN
1000.0000 mg | Freq: Once | INTRAVENOUS | Status: AC
  Administered 2021-10-05: 1000 mg via INTRAVENOUS

## 2021-10-05 MED ORDER — TRANEXAMIC ACID 1000 MG/10ML IV SOLN
INTRAVENOUS | Status: DC | PRN
  Administered 2021-10-05: 1000 mg via INTRAVENOUS

## 2021-10-05 MED ORDER — POVIDONE-IODINE 10 % EX SWAB
2.0000 "application " | Freq: Once | CUTANEOUS | Status: AC
  Administered 2021-10-05: 2 via TOPICAL

## 2021-10-05 MED ORDER — ONDANSETRON HCL 4 MG/2ML IJ SOLN
INTRAMUSCULAR | Status: AC
  Filled 2021-10-05: qty 2

## 2021-10-05 MED ORDER — LIDOCAINE 2% (20 MG/ML) 5 ML SYRINGE
INTRAMUSCULAR | Status: AC
  Filled 2021-10-05: qty 5

## 2021-10-05 MED ORDER — BUPIVACAINE-EPINEPHRINE 0.25% -1:200000 IJ SOLN
INTRAMUSCULAR | Status: DC | PRN
  Administered 2021-10-05: 10 mL

## 2021-10-05 MED ORDER — BUPIVACAINE-EPINEPHRINE (PF) 0.25% -1:200000 IJ SOLN
INTRAMUSCULAR | Status: AC
  Filled 2021-10-05: qty 60

## 2021-10-05 MED ORDER — SCOPOLAMINE 1 MG/3DAYS TD PT72
MEDICATED_PATCH | TRANSDERMAL | Status: AC
  Filled 2021-10-05: qty 1

## 2021-10-05 MED ORDER — SODIUM CHLORIDE 0.9 % IR SOLN: Status: DC | PRN

## 2021-10-05 MED ORDER — POVIDONE-IODINE 7.5 % EX SOLN
Freq: Once | CUTANEOUS | Status: AC
  Filled 2021-10-05: qty 118

## 2021-10-05 MED ORDER — EPINEPHRINE PF 1 MG/ML IJ SOLN
INTRAMUSCULAR | Status: AC
  Filled 2021-10-05: qty 4

## 2021-10-05 MED ORDER — LACTATED RINGERS IV SOLN: INTRAVENOUS | Status: DC

## 2021-10-05 MED ORDER — DEXAMETHASONE SODIUM PHOSPHATE 10 MG/ML IJ SOLN
INTRAMUSCULAR | Status: AC
  Filled 2021-10-05: qty 1

## 2021-10-05 MED ORDER — MEPERIDINE HCL 25 MG/ML IJ SOLN: 6.2500 mg | INTRAMUSCULAR | Status: DC | PRN

## 2021-10-05 MED ORDER — FENTANYL CITRATE (PF) 100 MCG/2ML IJ SOLN
100.0000 ug | Freq: Once | INTRAMUSCULAR | Status: AC
  Administered 2021-10-05: 100 ug via INTRAVENOUS

## 2021-10-05 MED ORDER — CEFAZOLIN SODIUM-DEXTROSE 2-4 GM/100ML-% IV SOLN: 2.0000 g | INTRAVENOUS | Status: DC

## 2021-10-05 MED ORDER — MIDAZOLAM HCL 2 MG/2ML IJ SOLN
2.0000 mg | Freq: Once | INTRAMUSCULAR | Status: AC
  Administered 2021-10-05: 2 mg via INTRAVENOUS

## 2021-10-05 MED ORDER — EPHEDRINE 5 MG/ML INJ
INTRAVENOUS | Status: AC
  Filled 2021-10-05: qty 5

## 2021-10-05 MED ORDER — EPHEDRINE SULFATE 50 MG/ML IJ SOLN: INTRAMUSCULAR | Status: DC | PRN

## 2021-10-05 MED ORDER — ROPIVACAINE HCL 5 MG/ML IJ SOLN
INTRAMUSCULAR | Status: DC | PRN
  Administered 2021-10-05: 30 mL via PERINEURAL

## 2021-10-05 MED ORDER — SCOPOLAMINE 1 MG/3DAYS TD PT72
1.0000 | MEDICATED_PATCH | TRANSDERMAL | Status: DC
  Administered 2021-10-05: 1.5 mg via TRANSDERMAL

## 2021-10-05 SURGICAL SUPPLY — 80 items
BANDAGE ESMARK 6X9 LF (GAUZE/BANDAGES/DRESSINGS) ×1 IMPLANT
BLADE EXCALIBUR 4.0X13 (MISCELLANEOUS) IMPLANT
BLADE SURG 10 STRL SS (BLADE) ×4 IMPLANT
BLADE SURG 15 STRL LF DISP TIS (BLADE) ×1 IMPLANT
BLADE SURG 15 STRL SS (BLADE) ×2
BNDG CMPR 9X6 STRL LF SNTH (GAUZE/BANDAGES/DRESSINGS) ×1
BNDG ELASTIC 4X5.8 VLCR STR LF (GAUZE/BANDAGES/DRESSINGS) ×2 IMPLANT
BNDG ELASTIC 6X5.8 VLCR STR LF (GAUZE/BANDAGES/DRESSINGS) ×2 IMPLANT
BNDG ESMARK 6X9 LF (GAUZE/BANDAGES/DRESSINGS) ×2
COOLER ICEMAN CLASSIC (MISCELLANEOUS) ×2 IMPLANT
CUFF TOURN SGL QUICK 34 (TOURNIQUET CUFF) ×2
CUFF TRNQT CYL 34X4.125X (TOURNIQUET CUFF) ×1 IMPLANT
DISSECTOR  3.8MM X 13CM (MISCELLANEOUS) ×2
DISSECTOR 3.8MM X 13CM (MISCELLANEOUS) ×1 IMPLANT
DRAPE ARTHROSCOPY W/POUCH 90 (DRAPES) ×2 IMPLANT
DRAPE C-ARM 42X72 X-RAY (DRAPES) ×2 IMPLANT
DRAPE C-ARMOR (DRAPES) ×2 IMPLANT
DRAPE IMP U-DRAPE 54X76 (DRAPES) ×2 IMPLANT
DRAPE INCISE IOBAN 66X45 STRL (DRAPES) ×2 IMPLANT
DRAPE U-SHAPE 47X51 STRL (DRAPES) ×2 IMPLANT
DRSG TEGADERM 2-3/8X2-3/4 SM (GAUZE/BANDAGES/DRESSINGS) IMPLANT
DRSG TEGADERM 4X4.75 (GAUZE/BANDAGES/DRESSINGS) ×10 IMPLANT
DURAPREP 26ML APPLICATOR (WOUND CARE) ×2 IMPLANT
DW OUTFLOW CASSETTE/TUBE SET (MISCELLANEOUS) ×2 IMPLANT
ELECT REM PT RETURN 9FT ADLT (ELECTROSURGICAL) ×2
ELECTRODE REM PT RTRN 9FT ADLT (ELECTROSURGICAL) ×1 IMPLANT
EXCALIBUR 3.8MM X 13CM (MISCELLANEOUS) IMPLANT
GAUZE 4X4 16PLY ~~LOC~~+RFID DBL (SPONGE) IMPLANT
GAUZE SPONGE 4X4 12PLY STRL (GAUZE/BANDAGES/DRESSINGS) ×2 IMPLANT
GAUZE XEROFORM 1X8 LF (GAUZE/BANDAGES/DRESSINGS) ×2 IMPLANT
GLOVE SRG 8 PF TXTR STRL LF DI (GLOVE) ×1 IMPLANT
GLOVE SURG ENC MOIS LTX SZ7 (GLOVE) ×2 IMPLANT
GLOVE SURG ENC MOIS LTX SZ8 (GLOVE) ×2 IMPLANT
GLOVE SURG UNDER POLY LF SZ7 (GLOVE) ×2 IMPLANT
GLOVE SURG UNDER POLY LF SZ8 (GLOVE) ×2
GOWN STRL REUS W/ TWL LRG LVL3 (GOWN DISPOSABLE) ×1 IMPLANT
GOWN STRL REUS W/ TWL XL LVL3 (GOWN DISPOSABLE) ×1 IMPLANT
GOWN STRL REUS W/TWL 2XL LVL3 (GOWN DISPOSABLE) ×2 IMPLANT
GOWN STRL REUS W/TWL LRG LVL3 (GOWN DISPOSABLE) ×2
GOWN STRL REUS W/TWL XL LVL3 (GOWN DISPOSABLE) ×2
HOLDER KNEE FOAM BLUE (MISCELLANEOUS) IMPLANT
IMMOBILIZER KNEE 22 UNIV (SOFTGOODS) ×2 IMPLANT
MANIFOLD NEPTUNE II (INSTRUMENTS) ×2 IMPLANT
NDL SAFETY ECLIPSE 18X1.5 (NEEDLE) ×2 IMPLANT
NEEDLE HYPO 18GX1.5 BLUNT FILL (NEEDLE) ×2 IMPLANT
NEEDLE HYPO 18GX1.5 SHARP (NEEDLE) ×4
PACK ARTHROSCOPY DSU (CUSTOM PROCEDURE TRAY) ×2 IMPLANT
PACK BASIN DAY SURGERY FS (CUSTOM PROCEDURE TRAY) ×2 IMPLANT
PACK IMPLANT BIOCOMPOSITE MPFL (Orthopedic Implant) ×2 IMPLANT
PAD COLD SHLDR WRAP-ON (PAD) ×2 IMPLANT
PADDING CAST COTTON 6X4 STRL (CAST SUPPLIES) ×2 IMPLANT
PASSER SUT SWANSON 36MM LOOP (INSTRUMENTS) ×2 IMPLANT
PENCIL SMOKE EVACUATOR (MISCELLANEOUS) ×2 IMPLANT
PORT APPOLLO RF 90DEGREE MULTI (SURGICAL WAND) IMPLANT
SPONGE T-LAP 4X18 ~~LOC~~+RFID (SPONGE) ×4 IMPLANT
SUCTION FRAZIER HANDLE 10FR (MISCELLANEOUS) ×2
SUCTION TUBE FRAZIER 10FR DISP (MISCELLANEOUS) ×1 IMPLANT
SUT ETHILON 3 0 PS 1 (SUTURE) ×4 IMPLANT
SUT FIBERWIRE #2 38 T-5 BLUE (SUTURE) ×4
SUT MENISCAL KIT (KITS) IMPLANT
SUT VIC AB 0 CT1 27 (SUTURE) ×6
SUT VIC AB 0 CT1 27XBRD ANBCTR (SUTURE) ×3 IMPLANT
SUT VIC AB 1 CT1 27 (SUTURE) ×2
SUT VIC AB 1 CT1 27XBRD ANBCTR (SUTURE) ×1 IMPLANT
SUT VIC AB 2-0 CT1 27 (SUTURE) ×6
SUT VIC AB 2-0 CT1 TAPERPNT 27 (SUTURE) ×3 IMPLANT
SUT VIC AB 3-0 FS2 27 (SUTURE) IMPLANT
SUT VICRYL 0 UR6 27IN ABS (SUTURE) IMPLANT
SUTURE FIBERWR #2 38 T-5 BLUE (SUTURE) ×2 IMPLANT
SUTURE TAPE 1.3 FIBERLOP 20 ST (SUTURE) ×2 IMPLANT
SUTURETAPE 1.3 FIBERLOOP 20 ST (SUTURE) ×4
SYR 5ML LL (SYRINGE) ×2 IMPLANT
SYR TB 1ML LL NO SAFETY (SYRINGE) ×2 IMPLANT
TENDON GRACILIS FROZEN (Bone Implant) ×2 IMPLANT
TENDON GRACILIS FROZEN 230-320 (Bone Implant) ×1 IMPLANT
TOWEL GREEN STERILE FF (TOWEL DISPOSABLE) ×2 IMPLANT
TRAY DSU PREP LF (CUSTOM PROCEDURE TRAY) IMPLANT
TUBING ARTHROSCOPY IRRIG 16FT (MISCELLANEOUS) ×2 IMPLANT
WRAP KNEE MAXI GEL POST OP (GAUZE/BANDAGES/DRESSINGS) ×2 IMPLANT
YANKAUER SUCT BULB TIP NO VENT (SUCTIONS) ×2 IMPLANT

## 2021-10-05 NOTE — Progress Notes (Signed)
Assisted Dr. Sabra Heck with right, ultrasound guided block. Side rails up, monitors on throughout procedure. See vital signs in flow sheet. Tolerated Procedure well.

## 2021-10-05 NOTE — H&P (Signed)
Christine Brady is an 60 y.o. female.   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  She is had pain for over a year as well as dislocation events with subluxation and dislocation about every other day.  Increase started in November.  No definite history of injury.  Takes Mobic as needed.  Has a history of idiopathic thrombocytopenic problems and her platelets usually run in the 90,000 range.  No personal or family history of DVT.  She is having some back issues with volvulus problems and had surgery in July 2020 she has 2 stairs at her house and has the support of her neighbors but no family that lives with her.  She has no history of left knee patellar dislocation.  Her patella on the right-hand side dislocated in November 2021 at [facility] and it has been dislocating significantly since that time.  She has had a CT scan which does not show unfavorable tibial tubercle trochlear groove distance and only mild degenerative changes within the patellofemoral joint.  Suggestion of lateral meniscus tear is present based on CT scan.  Past Medical History:  Diagnosis Date   Abdominal pain    Arthritis    difficulty walking   Atypical chest pain 11/11/2016   Bowel obstruction (HCC)    Cholesteatoma of right ear    Chronic headaches    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[place]) 11/11/2016   Cyst    back   Hearing aid worn    History of ITP    Hyperlipidemia 11/11/2016   Large ovary 07/09/2019   PONV (postoperative nausea and vomiting)    Reflux     Past Surgical History:  Procedure Laterality Date   CHOLECYSTECTOMY  2010   ESOPHAGOGASTRODUODENOSCOPY (EGD) WITH PROPOFOL N/A 06/12/2019   Procedure: ESOPHAGOGASTRODUODENOSCOPY (EGD) WITH PROPOFOL;  Surgeon: Carol Ada, MD;  Location: [facility]OSCOPY;  Service: Endoscopy;  Laterality: N/A;   EXTERNAL EAR SURGERY  2009/2010   EXTERNAL EAR  SURGERY  2010   right ear   KNEE ARTHROSCOPY     right   KNEE LIGAMENT RECONSTRUCTION     left   MASTOIDECTOMY Right 01/19/2017   Procedure: RIGHT MODIFIED RADICAL MASTOIDECTOMY;  Surgeon: Vicie Mutters, MD;  Location: [facility];  Service: ENT;  Laterality: Right;   UPPER ESOPHAGEAL ENDOSCOPIC ULTRASOUND (EUS) N/A 06/12/2019   Procedure: UPPER ESOPHAGEAL ENDOSCOPIC ULTRASOUND (EUS);  Surgeon: Carol Ada, MD;  Location: [facility];  Service: Endoscopy;  Laterality: N/A;   WISDOM TOOTH EXTRACTION      Family History  Problem Relation Age of Onset   Diabetes Mother    Asthma Mother    Diabetes type II Mother    Alcohol abuse Mother    Hypertension Mother    Diabetes Father    Hypertension Father    Kidney disease Father    Diabetes type II Father    Alcohol abuse Father    Crohn's disease Sister    Cerebral palsy Brother    COPD Sister    Thrombocytopenia Sister    Social History:  reports that she has never smoked. She has never used smokeless tobacco. She reports current alcohol use. She reports that she does not use drugs.  Allergies:  Allergies  Allergen Reactions   Loratadine     Other reaction(s): Unknown   Oxycodone     Skin crawling   Claritin-D [Loratadine-Pseudoephedrine Er] Rash   Codeine  Rash    Medications Prior to Admission  Medication Sig Dispense Refill   Calcium Carbonate-Vitamin D3 600-400 MG-UNIT TABS Take 2 tablets by mouth daily.     Caraway Oil-Levomenthol (FDGARD PO) Take 1 capsule by mouth daily.     cyclobenzaprine (FLEXERIL) 5 MG tablet Take 1 tablet (5 mg total) by mouth at bedtime for 30 days. 30 tablet 1   dicyclomine (BENTYL) 10 MG capsule Take 1 capsule (10 mg total) by mouth every 8 (eight) hours as needed for spasms. 90 capsule 0   estradiol (ESTRACE) 1 MG tablet Take by mouth.     estradiol (ESTRACE) 1 MG tablet Take 1 tablet by mouth every other day for 3 weeks, 1 tablet 2 times per week on Tuesday and Friday for 4  weeks, 1 tablet by mouth on Friday only for 4 weeks then stop taking 90 tablet 1   hydroquinone 4 % cream Apply topically to affected area (spot treatment) twice daily for 2 months then stop for 1 month 28.35 g 0   ibuprofen (ADVIL) 200 MG tablet Take 400 mg by mouth every 6 (six) hours as needed for headache or moderate pain.     Lactobacillus Rhamnosus, GG, (RA PROBIOTIC DIGESTIVE CARE) CAPS Take 1 tablet by mouth daily.     meloxicam (MOBIC) 15 MG tablet Take 1 tablet (15 mg total) by mouth daily. 30 tablet 3   methocarbamol (ROBAXIN) 500 MG tablet Take 1 tablet by mouth 3 times daily for 7 days. If tolerating the medication at 3 times per day, increase to 4 times per day. 21 tablet 2   omeprazole (PRILOSEC) 40 MG capsule Take 1 capsule (40 mg total) by mouth daily. 90 capsule 3   ondansetron (ZOFRAN) 4 MG tablet Take 1 tablet (4 mg total) by mouth every 8 (eight) hours as needed for nausea or vomiting. 30 tablet 0   Peppermint Oil (IBGARD) 90 MG CPCR Take 1 capsule by mouth daily.     polyethylene glycol (MIRALAX / GLYCOLAX) 17 g packet Take 17 g by mouth daily.     polyethylene glycol powder (GLYCOLAX/MIRALAX) 17 GM/SCOOP powder Take by mouth.     polyethylene glycol powder (GLYCOLAX/MIRALAX) 17 GM/SCOOP powder Take 1 scoop mixed in 8 oz of fluid daily 1530 g 4   progesterone (PROMETRIUM) 100 MG capsule progesterone micronized 100 mg capsule  TAKE 1 CAPSULE BY MOUTH ONCE DAILY     progesterone (PROMETRIUM) 100 MG capsule Take 1 capsule by mouth daily at bedtime. Stop taking when taking estradiol tablet only 1 time per week. 90 capsule 1   rosuvastatin (CRESTOR) 5 MG tablet TAKE 1 TABLET BY MOUTH TWICE WEEKLY 90 tablet 0   AMBULATORY NON FORMULARY MEDICATION Medication Name: Nitroglycerin gel 0.125% to use a pea sized amount per rectum three times daily as needed 30 g 1   dexlansoprazole (DEXILANT) 60 MG capsule 1 cap(s)     Diclofenac Sodium 3 % GEL Apply topically to the affected area on the  lower lip once daily. 100 g 0   estradiol (ESTRACE) 1 MG tablet TAKE 1 TABLET BY MOUTH ONCE DAILY 90 tablet 4   fluorouracil (EFUDEX) 5 % cream fluorouracil 5 % topical cream  APPLY TOPICALLY ONTO THE SKIN ONCE A DAY FOR 21 DAYS AS DIRECTED     hydroquinone 4 % cream Apply externally twice a day for spot treatment for 2 months then take a one month break. 28.35 g 0   Hyoscyamine Sulfate SL 0.125 MG  SUBL Take by mouth.     meclizine (ANTIVERT) 25 MG tablet Take 1 tablet by mouth every 6- 8 hours  as needed for imbalance (Patient not taking: No sig reported)     metroNIDAZOLE (FLAGYL) 500 MG tablet metronidazole 500 mg tablet     mupirocin ointment (BACTROBAN) 2 % mupirocin 2 % topical ointment  APPLY TWICE DAILY TO THE INCISION BEHIND THE EAR FOR 7 DAYS     neomycin (MYCIFRADIN) 500 MG tablet neomycin 500 mg tablet     omeprazole (PRILOSEC) 40 MG capsule 1 cap(s)     PRESCRIPTION MEDICATION Take 1 capsule by mouth daily before breakfast. Ultra Flora Probiotic     progesterone (PROMETRIUM) 100 MG capsule TAKE 1 CAPSULE BY MOUTH ONCE DAILY 90 capsule 4   progesterone (PROMETRIUM) 200 MG capsule Take 1 capsule by mouth every day. 90 capsule 0   traMADol (ULTRAM) 50 MG tablet tramadol 50 mg tablet  TAKE 1 TABLET (50 MG TOTAL) BY MOUTH EVERY 6 (SIX) HOURS AS NEEDED FOR UP TO 5 DAYS. (Patient not taking: No sig reported)      No results found for this or any previous visit (from the past 48 hour(s)). No results found.  Review of Systems  Musculoskeletal:  Positive for arthralgias.  All other systems reviewed and are negative.  Blood pressure 126/62, pulse (!) 57, temperature (!) 97.1 F (36.2 C), temperature source Oral, resp. rate 18, height 5\' 2"  (1.575 m), weight 64.2 kg, SpO2 100 %. Physical Exam Vitals reviewed.  HENT:     Head: Normocephalic.     Nose: Nose normal.     Mouth/Throat:     Mouth: Mucous membranes are moist.  Eyes:     Pupils: Pupils are equal, round, and reactive to  light.  Cardiovascular:     Rate and Rhythm: Normal rate.     Pulses: Normal pulses.  Pulmonary:     Effort: Pulmonary effort is normal.  Abdominal:     General: Abdomen is flat.  Musculoskeletal:     Cervical back: Normal range of motion.  Skin:    General: Skin is warm.     Capillary Refill: Capillary refill takes less than 2 seconds.  Neurological:     General: No focal deficit present.     Mental Status: She is alert.  Psychiatric:        Mood and Affect: Mood normal.   Ortho exam demonstrates normal gait alignment.  She has pretty reasonable quad strength bilaterally.  Positive patellar apprehension on the right negative on left.  She has about 2 cm of lateral mobility to 2-1/2 cm with lateral mobility both in extension and flexion.  Endpoint is soft on the right firmer on the left.  Collateral and cruciate ligaments are stable.  Lateral joint line tenderness is present on the right-hand side.  Pedal pulses palpable with intact ankle dorsiflexion strength  Assessment/Plan Impression is right knee pain with patellar instability and possible lateral meniscal tear.  Plan is right knee arthroscopy with meniscal debridement and open medial patellofemoral ligament reconstruction using allograft tendon.  Risk and benefits are discussed including not limited to infection nerve vessel damage knee stiffness knee pain over constraint of the patella along with under constraint and recurrent instability.  Patient understands risk benefits and the rehabilitation required.  All questions answered.  Would like her platelet count 90,000 or above for surgery.  Anderson Malta, MD 10/05/2021, 7:05 AM

## 2021-10-05 NOTE — Anesthesia Procedure Notes (Signed)
Anesthesia Regional Block: Adductor canal block   Pre-Anesthetic Checklist: , timeout performed,  Correct Patient, Correct Site, Correct Laterality,  Correct Procedure, Correct Position, site marked,  Risks and benefits discussed,  Surgical consent,  Pre-op evaluation,  At surgeon's request and post-op pain management  Laterality: Right  Prep: chloraprep       Needles:  Injection technique: Single-shot  Needle Type: Stimiplex     Needle Length: 9cm  Needle Gauge: 21     Additional Needles:   Procedures:,,,, ultrasound used (permanent image in chart),,    Narrative:  Start time: 10/05/2021 7:08 AM End time: 10/05/2021 7:13 AM Injection made incrementally with aspirations every 5 mL.  Performed by: Personally  Anesthesiologist: Lynda Rainwater, MD

## 2021-10-05 NOTE — Anesthesia Preprocedure Evaluation (Signed)
Anesthesia Evaluation  Patient identified by MRN, date of birth, ID band Patient awake    Reviewed: Allergy & Precautions, H&P , NPO status , Patient's Chart, lab work & pertinent test results  History of Anesthesia Complications (+) PONV  Airway Mallampati: II  TM Distance: >3 FB Neck ROM: Full    Dental  (+) Dental Advisory Given   Pulmonary neg pulmonary ROS,    breath sounds clear to auscultation       Cardiovascular negative cardio ROS   Rhythm:Regular Rate:Normal     Neuro/Psych  Headaches,    GI/Hepatic Neg liver ROS, GERD  ,  Endo/Other  negative endocrine ROS  Renal/GU negative Renal ROS     Musculoskeletal  (+) Arthritis ,   Abdominal   Peds  Hematology  (+) Blood dyscrasia (Hx of ITP), ,   Anesthesia Other Findings   Reproductive/Obstetrics                              Anesthesia Physical  Anesthesia Plan  ASA: III  Anesthesia Plan: General   Post-op Pain Management:    Induction: Intravenous  PONV Risk Score and Plan: 4 or greater and Ondansetron, Treatment may vary due to age or medical condition, Dexamethasone, Midazolam and Droperidol  Airway Management Planned: LMA  Additional Equipment:   Intra-op Plan:   Post-operative Plan: Extubation in OR  Informed Consent: I have reviewed the patients History and Physical, chart, labs and discussed the procedure including the risks, benefits and alternatives for the proposed anesthesia with the patient or authorized representative who has indicated his/her understanding and acceptance.     Dental advisory given  Plan Discussed with: Anesthesiologist, Surgeon and CRNA  Anesthesia Plan Comments:         Anesthesia Quick Evaluation

## 2021-10-05 NOTE — Transfer of Care (Signed)
Immediate Anesthesia Transfer of Care Note  Patient: Zarea R Barella  Procedure(s) Performed: right knee arthroscoy, debridement, lateral menisectomy, medial patellar femoral ligament reconstruction (Right: Knee)  Patient Location: PACU  Anesthesia Type:General and Regional  Level of Consciousness: awake, alert  and oriented  Airway & Oxygen Therapy: Patient Spontanous Breathing and Patient connected to face mask oxygen  Post-op Assessment: Report given to RN and Post -op Vital signs reviewed and stable  Post vital signs: Reviewed and stable  Last Vitals:  Vitals Value Taken Time  BP 125/60 10/05/21 1016  Temp    Pulse 83 10/05/21 1016  Resp 14 10/05/21 1016  SpO2 99 % 10/05/21 1016  Vitals shown include unvalidated device data.  Last Pain:  Vitals:   10/05/21 5396  TempSrc: Oral  PainSc: 0-No pain      Patients Stated Pain Goal: 5 (72/89/79 1504)  Complications: No notable events documented.

## 2021-10-05 NOTE — Anesthesia Postprocedure Evaluation (Signed)
Anesthesia Post Note  Patient: Christine Brady  Procedure(s) Performed: right knee arthroscoy, debridement, lateral menisectomy, medial patellar femoral ligament reconstruction (Right: Knee)     Patient location during evaluation: PACU Anesthesia Type: General Level of consciousness: awake and alert Pain management: pain level controlled Vital Signs Assessment: post-procedure vital signs reviewed and stable Respiratory status: spontaneous breathing, nonlabored ventilation and respiratory function stable Cardiovascular status: blood pressure returned to baseline and stable Postop Assessment: no apparent nausea or vomiting Anesthetic complications: no   No notable events documented.  Last Vitals:  Vitals:   10/05/21 1115 10/05/21 1127  BP: (!) 116/57 120/73  Pulse: 94 86  Resp: 13 17  Temp:    SpO2: 98% 96%    Last Pain:  Vitals:   10/05/21 1114  TempSrc:   PainSc: 4                  Lynda Rainwater

## 2021-10-05 NOTE — Anesthesia Procedure Notes (Signed)
Procedure Name: LMA Insertion Date/Time: 10/05/2021 10:19 AM Performed by: Verita Lamb, CRNA Pre-anesthesia Checklist: Patient identified, Emergency Drugs available, Suction available and Patient being monitored Patient Re-evaluated:Patient Re-evaluated prior to induction Oxygen Delivery Method: Circle system utilized Preoxygenation: Pre-oxygenation with 100% oxygen Induction Type: IV induction Ventilation: Mask ventilation without difficulty LMA: LMA inserted LMA Size: 3.0 Number of attempts: 1 Airway Equipment and Method: Bite block Placement Confirmation: positive ETCO2 Tube secured with: Tape Dental Injury: Teeth and Oropharynx as per pre-operative assessment

## 2021-10-05 NOTE — Brief Op Note (Signed)
   10/05/2021  11:37 AM  PATIENT:  Christine Brady  60 y.o. female  PRE-OPERATIVE DIAGNOSIS:  right knee patella instability, lateral meniscal tear  POST-OPERATIVE DIAGNOSIS:  right knee patella instability, lateral meniscal tear  PROCEDURE:  Procedure(s): right knee arthroscoy, debridement, lateral menisectomy, removal of loose body and medial patellar femoral ligament reconstruction  SURGEON:  Surgeon(s): Meredith Pel, MD  ASSISTANT: Magnant PA  ANESTHESIA:   general  EBL: 15 ml    Total I/O In: 900 [I.V.:900] Out: 30 [Blood:30]  BLOOD ADMINISTERED: none  DRAINS: none   LOCAL MEDICATIONS USED: Marcaine morphine clonidine  SPECIMEN:  No Specimen  COUNTS:  YES  TOURNIQUET:   Total Tourniquet Time Documented: Thigh (Right) - 56 minutes Total: Thigh (Right) - 56 minutes   DICTATION: .[id number]  PLAN OF CARE: Discharge to home after PACU  PATIENT DISPOSITION:  PACU - hemodynamically stable

## 2021-10-05 NOTE — Discharge Instructions (Addendum)
Next dose of Tylenol can be given at 4:45pm if needed.   Post Anesthesia Home Care Instructions  Activity: Get plenty of rest for the remainder of the day. A responsible individual must stay with you for 24 hours following the procedure.  For the next 24 hours, DO NOT: -Drive a car -Paediatric nurse -Drink alcoholic beverages -Take any medication unless instructed by your physician -Make any legal decisions or sign important papers.  Meals: Start with liquid foods such as gelatin or soup. Progress to regular foods as tolerated. Avoid greasy, spicy, heavy foods. If nausea and/or vomiting occur, drink only clear liquids until the nausea and/or vomiting subsides. Call your physician if vomiting continues.  Special Instructions/Symptoms: Your throat may feel dry or sore from the anesthesia or the breathing tube placed in your throat during surgery. If this causes discomfort, gargle with warm salt water. The discomfort should disappear within 24 hours.  If you had a scopolamine patch placed behind your ear for the management of post- operative nausea and/or vomiting:  1. The medication in the patch is effective for 72 hours, after which it should be removed.  Wrap patch in a tissue and discard in the trash. Wash hands thoroughly with soap and water. 2. You may remove the patch earlier than 72 hours if you experience unpleasant side effects which may include dry mouth, dizziness or visual disturbances. 3. Avoid touching the patch. Wash your hands with soap and water after contact with the patch.  Regional Anesthesia Blocks  1. Numbness or the inability to move the "blocked" extremity may last from 3-48 hours after placement. The length of time depends on the medication injected and your individual response to the medication. If the numbness is not going away after 48 hours, call your surgeon.  2. The extremity that is blocked will need to be protected until the numbness is gone and the   Strength has returned. Because you cannot feel it, you will need to take extra care to avoid injury. Because it may be weak, you may have difficulty moving it or using it. You may not know what position it is in without looking at it while the block is in effect.  3. For blocks in the legs and feet, returning to weight bearing and walking needs to be done carefully. You will need to wait until the numbness is entirely gone and the strength has returned. You should be able to move your leg and foot normally before you try and bear weight or walk. You will need someone to be with you when you first try to ensure you do not fall and possibly risk injury.  4. Bruising and tenderness at the needle site are common side effects and will resolve in a few days.  5. Persistent numbness or new problems with movement should be communicated to the surgeon or the [place] [phone] [place] [phone]).

## 2021-10-06 ENCOUNTER — Encounter: Payer: Self-pay | Admitting: Orthopedic Surgery

## 2021-10-06 ENCOUNTER — Encounter (HOSPITAL_BASED_OUTPATIENT_CLINIC_OR_DEPARTMENT_OTHER): Payer: Self-pay | Admitting: Orthopedic Surgery

## 2021-10-06 ENCOUNTER — Other Ambulatory Visit (HOSPITAL_COMMUNITY): Payer: Self-pay

## 2021-10-06 NOTE — Op Note (Signed)
Christine Brady, Christine Brady MEDICAL RECORD NO: [id number] ACCOUNT NO: [id number] DATE OF BIRTH: 06-29-1961 FACILITY: [facility] LOCATION: [facility] PHYSICIAN: Yetta Barre. Marlou Sa, MD  Operative Report   DATE OF PROCEDURE: 10/05/2021  PREOPERATIVE DIAGNOSIS:  Right knee lateral meniscal tear and patellar instability.  POSTOPERATIVE DIAGNOSIS:  Right knee lateral meniscal tear, patellar instability, loose body.  PROCEDURE:  Right knee arthroscopy with partial lateral meniscectomy and removal of loose body from the anterior compartment measuring 6 x 6 mm.  Open medial patellofemoral ligament reconstruction using gracilis allograft.  SURGEON:  Yetta Barre. Marlou Sa, MD  ASSISTANT:  Annie Main, PA.  INDICATIONS:  The patient is a 60 year old patient with recurrent patellofemoral instability as well as suggestion of lateral meniscal tear on CT scanning, presents now after significant and recurrent patellar instability over the last 10 months.  DESCRIPTION OF PROCEDURE:  The patient was brought to the operating room where general anesthetic was induced.  Preoperative antibiotics were administered.  Timeout was called.  Right knee was examined under anesthesia and found to have over 2 cm of  lateral patellar mobility with no firm endpoint at both 0 and 30 degrees of flexion.  This compared to about 1.5 cm on the left hand side with firm endpoint at 0 and 30 degrees.  ACL and PCL intact on the right hand side along with stable collateral  cruciate ligaments.  After examination under anesthesia, the right leg was prescrubbed with alcohol and Betadine, allowed to air dry.  Prepped with DuraPrep solution and draped in sterile manner.  Anterior inferior lateral portal was established,  anterior inferomedial portal was established under direct visualization.  Diagnostic arthroscopy was performed.  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   No loose  bodies in the medial or lateral gutter.  There was a loose body in the anterior compartment.  Anteromedial portal was created and that loose body was removed.  The medial meniscus and articular cartilage was intact.  ACL and PCL intact.  Lateral  compartment had a tear of the anterior horn of the lateral meniscus involving about 70% of the anterior, posterior width of the meniscus.  This was debrided back to a stable rim using a combination of basket punch and shaver.  There was also some early  grade 3 and small areas of grade 4 chondromalacia over 20% of the lateral aspect of the weightbearing surface area of the lateral femoral condyle.  No loose chondral flaps were present in this region.  Following removal of loose body and partial lateral  meniscectomy instruments were removed.  Portals were closed using 3-0 nylon.  Next, Charlie Pitter was used to cover the operative field.  The leg was elevated and exsanguinated with Esmarch wrap.  Tourniquet was inflated for a total tourniquet time of 55  minutes.  Incision was made off the medial aspect of the patella measuring about 5 cm.  Skin and subcutaneous tissue sharply divided.  The remaining extraarticular layers of the interval between layers 2 and 3 was developed down to the medial epicondyle.   Next, remaining extraarticular two drill holes were placed in the superior two-thirds of the patella.  The gracilis allograft was prepared on the back table by [facility].  The two ends of the appropriately sized graft were then screwed using Arthrex  SwiveLocks into the patella with good purchase obtained.  Next, under fluoroscopic guidance, the isometric attachment site of the MPFL was  determined.  Once that was determined, the tunnel was drilled and the graft was passed in that plane between  layers 2 and 3.  Then, with the patella over about 1.5 to 2 cm the screw was tightened.  It should be noted that isometry of the graft was tested before placing the  screw.  It was nicely isometric.  The screw was then placed and the patient had very good  range of motion without over constraint, but appropriate endpoint to lateral translation.  Tourniquet released.  Bleeding points encountered were controlled with electrocautery.  Thorough irrigation was performed.  The deep tissue was closed using a #1  Vicryl suture followed by interrupted inverted 0 Vicryl suture, 2-0 Vicryl suture, and 3-0 Monocryl.  Steri-Strips and impervious dressing was applied.  A solution of Marcaine, morphine, clonidine injected into the knee for postoperative pain relief.   The patient tolerated the procedure well without immediate complication.  A bulky dressing, knee immobilizer applied.  Luke's assistance was required for opening, closing, retraction, graft preparation.  His assistance was a medical necessity.   PUS D: 10/05/2021 11:43:53 am T: 10/06/2021 3:22:00 am  JOB: [id number]/ [id number]

## 2021-10-07 ENCOUNTER — Telehealth: Payer: Self-pay | Admitting: Orthopedic Surgery

## 2021-10-07 ENCOUNTER — Other Ambulatory Visit: Payer: Self-pay | Admitting: Surgical

## 2021-10-07 MED ORDER — HYDROMORPHONE HCL 2 MG PO TABS: 1.0000 mg | ORAL_TABLET | ORAL | 0 refills | Status: DC | PRN

## 2021-10-07 NOTE — Telephone Encounter (Signed)
I called and worked it out.  She will call if continued pain/difficulty

## 2021-10-07 NOTE — Telephone Encounter (Signed)
Please advise if this is okay to write for patient. She is s/p right knee arthroscoy, debridement, lateral menisectomy, medial patellar femoral ligament reconstruction preformed on 10/05/2021

## 2021-10-07 NOTE — Telephone Encounter (Signed)
Address:[address], [place], [place] [address]   Here is the location she would like the RX faxed to.  Can you call her to let her know after you send it and repeat the address to her? Thanks you da bomb

## 2021-10-07 NOTE — Telephone Encounter (Signed)
Pt would like to know if she can get a script for a shower chair and can you send it somewhere in [place]?  [phone]

## 2021-10-07 NOTE — Telephone Encounter (Signed)
Faxed order to [phone][facility]  Address: [address], [place], [place] [address]  Phone: [phone]    Patient confirmed she knows where address is.

## 2021-10-09 ENCOUNTER — Other Ambulatory Visit: Payer: Self-pay | Admitting: Surgical

## 2021-10-09 ENCOUNTER — Telehealth: Payer: Self-pay | Admitting: Orthopedic Surgery

## 2021-10-09 MED ORDER — GABAPENTIN 100 MG PO CAPS: 100.0000 mg | ORAL_CAPSULE | Freq: Three times a day (TID) | ORAL | 0 refills | Status: DC

## 2021-10-09 MED ORDER — CELECOXIB 100 MG PO CAPS: 100.0000 mg | ORAL_CAPSULE | Freq: Two times a day (BID) | ORAL | 0 refills | Status: DC

## 2021-10-09 MED ORDER — HYDROMORPHONE HCL 2 MG PO TABS: 2.0000 mg | ORAL_TABLET | ORAL | 0 refills | Status: AC | PRN

## 2021-10-09 NOTE — Telephone Encounter (Signed)
Patient called. Says she missed a call from [place] and thinks it was Lauren calling about her medication. Her call back number is [phone]

## 2021-10-09 NOTE — Telephone Encounter (Signed)
Pt called requesting a refill or different pain meds. Pt states she has been taking every 8 hrs. Meds is really not helping pain. Please call pt and send in something stronger or up dosage. Pt states she thinks she will run out before weekend is out. Phone number is [phone].

## 2021-10-10 DIAGNOSIS — M2341 Loose body in knee, right knee: Secondary | ICD-10-CM

## 2021-10-10 DIAGNOSIS — S83271A Complex tear of lateral meniscus, current injury, right knee, initial encounter: Secondary | ICD-10-CM

## 2021-10-10 DIAGNOSIS — M25361 Other instability, right knee: Secondary | ICD-10-CM

## 2021-10-12 ENCOUNTER — Encounter: Payer: Self-pay | Admitting: Orthopedic Surgery

## 2021-10-12 ENCOUNTER — Ambulatory Visit (INDEPENDENT_AMBULATORY_CARE_PROVIDER_SITE_OTHER): Payer: 59 | Admitting: Orthopedic Surgery

## 2021-10-12 ENCOUNTER — Other Ambulatory Visit: Payer: Self-pay

## 2021-10-12 DIAGNOSIS — Z9889 Other specified postprocedural states: Secondary | ICD-10-CM

## 2021-10-12 DIAGNOSIS — S83281D Other tear of lateral meniscus, current injury, right knee, subsequent encounter: Secondary | ICD-10-CM

## 2021-10-12 NOTE — Telephone Encounter (Signed)
I did

## 2021-10-13 DIAGNOSIS — S83281D Other tear of lateral meniscus, current injury, right knee, subsequent encounter: Secondary | ICD-10-CM | POA: Diagnosis not present

## 2021-10-15 ENCOUNTER — Telehealth: Payer: Self-pay | Admitting: Orthopedic Surgery

## 2021-10-15 NOTE — Telephone Encounter (Signed)
OP note faxed to [facility] [phone]

## 2021-10-19 DIAGNOSIS — M6281 Muscle weakness (generalized): Secondary | ICD-10-CM | POA: Diagnosis not present

## 2021-10-19 DIAGNOSIS — M25561 Pain in right knee: Secondary | ICD-10-CM | POA: Diagnosis not present

## 2021-10-19 DIAGNOSIS — R2689 Other abnormalities of gait and mobility: Secondary | ICD-10-CM | POA: Diagnosis not present

## 2021-10-20 ENCOUNTER — Encounter: Payer: Self-pay | Admitting: Orthopedic Surgery

## 2021-10-20 NOTE — Progress Notes (Addendum)
Post-Op Visit Note   Patient: Christine Brady           Date of Birth: Dec 10, 1961           MRN: [id number] Visit Date: 10/12/2021 PCP: Ronita Hipps, MD   Assessment & Plan:  Chief Complaint:  Chief Complaint  Patient presents with   Right Knee - Post-op Follow-up   Visit Diagnoses:  1. Acute lateral meniscus tear of right knee, subsequent encounter   2. Post-operative state     Plan: Christine Brady is now 1 week out from right knee arthroscopy and partial meniscectomy as well as MPFL reconstruction.  Patient has been having expected amount of postop pain.  She has been in a knee immobilizer.  On exam she has excellent patellar stability.  Negative Homans no calf tenderness.  Has been on aspirin for DVT prophylaxis.  Incisions intact.  Portal sutures removed.  Plan at this time is to let her continue weightbearing as tolerated and begin leg strengthening exercises along with range of motion exercises.  Come back in 2 weeks for clinical recheck. Start therapy next week for range of motion 0-60.  Okay for quad strengthening.  Okay for weightbearing as tolerated.  CPM currently is at 0-30.  Start that this week 3 hours a day.  Follow-up in 2 weeks for clinical recheck on range of motion.  Patella stability good today.  Follow-Up Instructions: No follow-ups on file.   Orders:  Orders Placed This Encounter  Procedures   Ambulatory referral to Physical Therapy   No orders of the defined types were placed in this encounter.   Imaging: No results found.  PMFS History: Patient Active Problem List   Diagnosis Date Noted   Patellar instability of right knee    Complex tear of lateral meniscus of right knee    Loose body in knee, right knee    Constipation 06/05/2021   Diverticular disease of colon 06/05/2021   Family history of other diseases of the digestive system 06/05/2021   Nausea 06/05/2021   Otalgia, left 06/05/2021   Right upper quadrant pain 06/05/2021   Acute lateral  meniscus tear of right knee 06/05/2021   Patellar dislocation, right, sequela 06/05/2021   Diplacusis of left ear 10/02/2020   History of right mastoidectomy 04/10/2020   Postop check 02/29/2020   Bowel obstruction ([place]) 09/28/2019   Idiopathic thrombocytopenia ([place]) 09/28/2019   Eustachian tube dysfunction, bilateral 09/28/2019   Tinnitus aurium, bilateral 09/28/2019   Tympanic membrane central perforation, 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   Unspecified cholesteatoma, left ear 09/28/2019   IBS (irritable bowel syndrome) 07/09/2019   Headache 07/09/2019   GERD (gastroesophageal reflux disease) 07/09/2019   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 07/03/2019 07/03/2019   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   Menopausal syndrome 03/13/2018   History of postoperative nausea 01/19/2017   Atypical chest pain 11/11/2016   Hyperlipidemia 11/11/2016   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[place]) 11/11/2016   Epidermoid cyst on back 10/28/2011   Past Medical History:  Diagnosis Date   Abdominal pain    Arthritis    difficulty walking   Atypical chest pain 11/11/2016   Bowel obstruction ([place])    Cholesteatoma of right ear    Chronic headaches    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[place]) 11/11/2016   Cyst    back   Hearing aid worn    History of ITP    Hyperlipidemia 11/11/2016   Large ovary 07/09/2019   PONV (postoperative nausea and vomiting)  Reflux     Family History  Problem Relation Age of Onset   Diabetes Mother    Asthma Mother    Diabetes type II Mother    Alcohol abuse Mother    Hypertension Mother    Diabetes Father    Hypertension Father    Kidney disease Father    Diabetes type II Father    Alcohol abuse Father    Crohn's disease Sister    Cerebral palsy Brother    COPD Sister    Thrombocytopenia Sister     Past Surgical History:  Procedure Laterality Date   CHOLECYSTECTOMY  2010   ESOPHAGOGASTRODUODENOSCOPY (EGD) WITH PROPOFOL N/A 06/12/2019   Procedure:  ESOPHAGOGASTRODUODENOSCOPY (EGD) WITH PROPOFOL;  Surgeon: Carol Ada, MD;  Location: [facility];  Service: Endoscopy;  Laterality: N/A;   EXTERNAL EAR SURGERY  2009/2010   EXTERNAL EAR SURGERY  2010   right ear   KNEE ARTHROSCOPY     right   KNEE ARTHROSCOPY WITH MEDIAL PATELLAR FEMORAL LIGAMENT RECONSTRUCTION Right 10/05/2021   Procedure: right knee arthroscoy, debridement, lateral menisectomy, medial patellar femoral ligament reconstruction;  Surgeon: Meredith Pel, MD;  Location: [facility];  Service: Orthopedics;  Laterality: Right;   KNEE LIGAMENT RECONSTRUCTION     left   MASTOIDECTOMY Right 01/19/2017   Procedure: RIGHT MODIFIED RADICAL MASTOIDECTOMY;  Surgeon: Vicie Mutters, MD;  Location: [facility];  Service: ENT;  Laterality: Right;   UPPER ESOPHAGEAL ENDOSCOPIC ULTRASOUND (EUS) N/A 06/12/2019   Procedure: UPPER ESOPHAGEAL ENDOSCOPIC ULTRASOUND (EUS);  Surgeon: Carol Ada, MD;  Location: [facility];  Service: Endoscopy;  Laterality: N/A;   WISDOM TOOTH EXTRACTION     Social History   Occupational History   Not on file  Tobacco Use   Smoking status: Never   Smokeless tobacco: Never  Vaping Use   Vaping Use: Never used  Substance and Sexual Activity   Alcohol use: Yes    Comment: occas   Drug use: No   Sexual activity: Not on file

## 2021-10-21 DIAGNOSIS — R2689 Other abnormalities of gait and mobility: Secondary | ICD-10-CM | POA: Diagnosis not present

## 2021-10-21 DIAGNOSIS — M25561 Pain in right knee: Secondary | ICD-10-CM | POA: Diagnosis not present

## 2021-10-21 DIAGNOSIS — M6281 Muscle weakness (generalized): Secondary | ICD-10-CM | POA: Diagnosis not present

## 2021-10-24 ENCOUNTER — Other Ambulatory Visit (HOSPITAL_COMMUNITY): Payer: Self-pay

## 2021-10-24 MED FILL — Rosuvastatin Calcium Tab 5 MG: ORAL | 63 days supply | Qty: 18 | Fill #2 | Status: AC

## 2021-10-27 ENCOUNTER — Other Ambulatory Visit (HOSPITAL_COMMUNITY): Payer: Self-pay

## 2021-10-27 DIAGNOSIS — R2689 Other abnormalities of gait and mobility: Secondary | ICD-10-CM | POA: Diagnosis not present

## 2021-10-27 DIAGNOSIS — M25561 Pain in right knee: Secondary | ICD-10-CM | POA: Diagnosis not present

## 2021-10-27 DIAGNOSIS — M6281 Muscle weakness (generalized): Secondary | ICD-10-CM | POA: Diagnosis not present

## 2021-10-28 ENCOUNTER — Ambulatory Visit (INDEPENDENT_AMBULATORY_CARE_PROVIDER_SITE_OTHER): Payer: 59 | Admitting: Orthopedic Surgery

## 2021-10-28 ENCOUNTER — Other Ambulatory Visit: Payer: Self-pay

## 2021-10-28 ENCOUNTER — Other Ambulatory Visit (HOSPITAL_COMMUNITY): Payer: Self-pay

## 2021-10-28 DIAGNOSIS — S83281D Other tear of lateral meniscus, current injury, right knee, subsequent encounter: Secondary | ICD-10-CM

## 2021-10-28 MED ORDER — METHOCARBAMOL 500 MG PO TABS
500.0000 mg | ORAL_TABLET | Freq: Two times a day (BID) | ORAL | 0 refills | Status: DC | PRN
  Filled 2021-10-28: qty 40, 20d supply, fill #0

## 2021-10-29 DIAGNOSIS — R2689 Other abnormalities of gait and mobility: Secondary | ICD-10-CM | POA: Diagnosis not present

## 2021-10-29 DIAGNOSIS — M6281 Muscle weakness (generalized): Secondary | ICD-10-CM | POA: Diagnosis not present

## 2021-10-29 DIAGNOSIS — M25561 Pain in right knee: Secondary | ICD-10-CM | POA: Diagnosis not present

## 2021-10-30 ENCOUNTER — Encounter: Payer: Self-pay | Admitting: Orthopedic Surgery

## 2021-10-30 DIAGNOSIS — M25561 Pain in right knee: Secondary | ICD-10-CM | POA: Diagnosis not present

## 2021-10-30 DIAGNOSIS — M6281 Muscle weakness (generalized): Secondary | ICD-10-CM | POA: Diagnosis not present

## 2021-10-30 DIAGNOSIS — R2689 Other abnormalities of gait and mobility: Secondary | ICD-10-CM | POA: Diagnosis not present

## 2021-10-30 NOTE — Progress Notes (Signed)
Post-Op Visit Note   Patient: Christine Brady           Date of Birth: 1961/11/13           MRN: [id number] Visit Date: 10/28/2021 PCP: Ronita Hipps, MD   Assessment & Plan:  Chief Complaint:  Chief Complaint  Patient presents with   Right Knee - Routine Post Op     10/05/21 (3w 2d) right knee arthroscoy, debridement, lateral menisectomy, medial patellar femoral ligament reconstruction     Visit Diagnoses: No diagnosis found.  Plan: Keerthana is a patient who is now 3 weeks out right knee arthroscopy debridement lateral meniscectomy medial patellofemoral ligament reconstruction.  On exam she can do a straight leg raise.  Patella stability feels good.  She is having some pain posteriorly and believes that popliteal cyst does come back on the lateral side.  Ultrasound examination demonstrates no popliteal cyst.  Negative Homans no calf tenderness.  Patient has been taking aspirin.  Plan is to refill Robaxin.  Hinged knee brace for ambulation weightbearing as tolerated.  Would like her to continue to work on range of motion and we will see her back in 3 weeks  Follow-Up Instructions: Return in about 3 weeks (around 11/18/2021).   Orders:  No orders of the defined types were placed in this encounter.  Meds ordered this encounter  Medications   methocarbamol (ROBAXIN) 500 MG tablet    Sig: Take 1 tablet by mouth every 12 hours as needed.    Dispense:  40 tablet    Refill:  0    Imaging: No results found.  PMFS History: Patient Active Problem List   Diagnosis Date Noted   Patellar instability of right knee    Complex tear of lateral meniscus of right knee    Loose body in knee, right knee    Constipation 06/05/2021   Diverticular disease of colon 06/05/2021   Family history of other diseases of the digestive system 06/05/2021   Nausea 06/05/2021   Otalgia, left 06/05/2021   Right upper quadrant pain 06/05/2021   Acute lateral meniscus tear of right knee 06/05/2021    Patellar dislocation, right, sequela 06/05/2021   Diplacusis of left ear 10/02/2020   History of right mastoidectomy 04/10/2020   Postop check 02/29/2020   Bowel obstruction ([place]) 09/28/2019   Idiopathic thrombocytopenia ([facility]) 09/28/2019   Eustachian tube dysfunction, bilateral 09/28/2019   Tinnitus aurium, bilateral 09/28/2019   Tympanic membrane central perforation, 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   Unspecified cholesteatoma, left ear 09/28/2019   IBS (irritable bowel syndrome) 07/09/2019   Headache 07/09/2019   GERD (gastroesophageal reflux disease) 07/09/2019   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 07/03/2019 07/03/2019   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   Menopausal syndrome 03/13/2018   History of postoperative nausea 01/19/2017   Atypical chest pain 11/11/2016   Hyperlipidemia 11/11/2016   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[place]) 11/11/2016   Epidermoid cyst on back 10/28/2011   Past Medical History:  Diagnosis Date   Abdominal pain    Arthritis    difficulty walking   Atypical chest pain 11/11/2016   Bowel obstruction ([place])    Cholesteatoma of right ear    Chronic headaches    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[place]) 11/11/2016   Cyst    back   Hearing aid worn    History of ITP    Hyperlipidemia 11/11/2016   Large ovary 07/09/2019   PONV (postoperative nausea and vomiting)    Reflux  Family History  Problem Relation Age of Onset   Diabetes Mother    Asthma Mother    Diabetes type II Mother    Alcohol abuse Mother    Hypertension Mother    Diabetes Father    Hypertension Father    Kidney disease Father    Diabetes type II Father    Alcohol abuse Father    Crohn's disease Sister    Cerebral palsy Brother    COPD Sister    Thrombocytopenia Sister     Past Surgical History:  Procedure Laterality Date   CHOLECYSTECTOMY  2010   ESOPHAGOGASTRODUODENOSCOPY (EGD) WITH PROPOFOL N/A 06/12/2019   Procedure: ESOPHAGOGASTRODUODENOSCOPY (EGD) WITH  PROPOFOL;  Surgeon: Carol Ada, MD;  [facility];  Service: Endoscopy;  Laterality: N/A;   EXTERNAL EAR SURGERY  2009/2010   EXTERNAL EAR SURGERY  2010   right ear   KNEE ARTHROSCOPY     right   KNEE ARTHROSCOPY WITH MEDIAL PATELLAR FEMORAL LIGAMENT RECONSTRUCTION Right 10/05/2021   Procedure: right knee arthroscoy, debridement, lateral menisectomy, medial patellar femoral ligament reconstruction;  Surgeon: Meredith Pel, MD;  Location: [facility];  Service: Orthopedics;  Laterality: Right;   KNEE LIGAMENT RECONSTRUCTION     left   MASTOIDECTOMY Right 01/19/2017   Procedure: RIGHT MODIFIED RADICAL MASTOIDECTOMY;  Surgeon: Vicie Mutters, MD;  Location: [facility];  Service: ENT;  Laterality: Right;   UPPER ESOPHAGEAL ENDOSCOPIC ULTRASOUND (EUS) N/A 06/12/2019   Procedure: UPPER ESOPHAGEAL ENDOSCOPIC ULTRASOUND (EUS);  Surgeon: Carol Ada, MD;  Location: [facility];  Service: Endoscopy;  Laterality: N/A;   WISDOM TOOTH EXTRACTION     Social History   Occupational History   Not on file  Tobacco Use   Smoking status: Never   Smokeless tobacco: Never  Vaping Use   Vaping Use: Never used  Substance and Sexual Activity   Alcohol use: Yes    Comment: occas   Drug use: No   Sexual activity: Not on file

## 2021-11-02 DIAGNOSIS — M25561 Pain in right knee: Secondary | ICD-10-CM | POA: Diagnosis not present

## 2021-11-02 DIAGNOSIS — M6281 Muscle weakness (generalized): Secondary | ICD-10-CM | POA: Diagnosis not present

## 2021-11-02 DIAGNOSIS — R2689 Other abnormalities of gait and mobility: Secondary | ICD-10-CM | POA: Diagnosis not present

## 2021-11-03 DIAGNOSIS — M8589 Other specified disorders of bone density and structure, multiple sites: Secondary | ICD-10-CM | POA: Diagnosis not present

## 2021-11-04 DIAGNOSIS — M25561 Pain in right knee: Secondary | ICD-10-CM | POA: Diagnosis not present

## 2021-11-04 DIAGNOSIS — M6281 Muscle weakness (generalized): Secondary | ICD-10-CM | POA: Diagnosis not present

## 2021-11-04 DIAGNOSIS — R2689 Other abnormalities of gait and mobility: Secondary | ICD-10-CM | POA: Diagnosis not present

## 2021-11-06 ENCOUNTER — Other Ambulatory Visit: Payer: Self-pay | Admitting: Surgical

## 2021-11-06 DIAGNOSIS — Z9889 Other specified postprocedural states: Secondary | ICD-10-CM

## 2021-11-09 DIAGNOSIS — M25561 Pain in right knee: Secondary | ICD-10-CM | POA: Diagnosis not present

## 2021-11-09 DIAGNOSIS — M6281 Muscle weakness (generalized): Secondary | ICD-10-CM | POA: Diagnosis not present

## 2021-11-09 DIAGNOSIS — R2689 Other abnormalities of gait and mobility: Secondary | ICD-10-CM | POA: Diagnosis not present

## 2021-11-10 ENCOUNTER — Encounter: Payer: Self-pay | Admitting: Orthopedic Surgery

## 2021-11-10 NOTE — Telephone Encounter (Signed)
Ok for 4 hours sedentary per day until rov pls calal thx

## 2021-11-11 DIAGNOSIS — M25561 Pain in right knee: Secondary | ICD-10-CM | POA: Diagnosis not present

## 2021-11-11 DIAGNOSIS — M6281 Muscle weakness (generalized): Secondary | ICD-10-CM | POA: Diagnosis not present

## 2021-11-11 DIAGNOSIS — R2689 Other abnormalities of gait and mobility: Secondary | ICD-10-CM | POA: Diagnosis not present

## 2021-11-13 DIAGNOSIS — H6993 Unspecified Eustachian tube disorder, bilateral: Secondary | ICD-10-CM | POA: Diagnosis not present

## 2021-11-13 DIAGNOSIS — M25561 Pain in right knee: Secondary | ICD-10-CM | POA: Diagnosis not present

## 2021-11-13 DIAGNOSIS — H93212 Auditory recruitment, left ear: Secondary | ICD-10-CM | POA: Diagnosis not present

## 2021-11-13 DIAGNOSIS — H90A11 Conductive hearing loss, unilateral, right ear with restricted hearing on the contralateral side: Secondary | ICD-10-CM | POA: Diagnosis not present

## 2021-11-13 DIAGNOSIS — H93222 Diplacusis, left ear: Secondary | ICD-10-CM | POA: Diagnosis not present

## 2021-11-13 DIAGNOSIS — Z9621 Cochlear implant status: Secondary | ICD-10-CM | POA: Diagnosis not present

## 2021-11-13 DIAGNOSIS — R2689 Other abnormalities of gait and mobility: Secondary | ICD-10-CM | POA: Diagnosis not present

## 2021-11-13 DIAGNOSIS — Z9089 Acquired absence of other organs: Secondary | ICD-10-CM | POA: Diagnosis not present

## 2021-11-13 DIAGNOSIS — M6281 Muscle weakness (generalized): Secondary | ICD-10-CM | POA: Diagnosis not present

## 2021-11-16 DIAGNOSIS — M25561 Pain in right knee: Secondary | ICD-10-CM | POA: Diagnosis not present

## 2021-11-16 DIAGNOSIS — R2689 Other abnormalities of gait and mobility: Secondary | ICD-10-CM | POA: Diagnosis not present

## 2021-11-16 DIAGNOSIS — M6281 Muscle weakness (generalized): Secondary | ICD-10-CM | POA: Diagnosis not present

## 2021-11-17 DIAGNOSIS — R2689 Other abnormalities of gait and mobility: Secondary | ICD-10-CM | POA: Diagnosis not present

## 2021-11-17 DIAGNOSIS — M6281 Muscle weakness (generalized): Secondary | ICD-10-CM | POA: Diagnosis not present

## 2021-11-17 DIAGNOSIS — M25561 Pain in right knee: Secondary | ICD-10-CM | POA: Diagnosis not present

## 2021-11-18 ENCOUNTER — Ambulatory Visit (INDEPENDENT_AMBULATORY_CARE_PROVIDER_SITE_OTHER): Payer: 59 | Admitting: Orthopedic Surgery

## 2021-11-18 ENCOUNTER — Other Ambulatory Visit (HOSPITAL_COMMUNITY): Payer: Self-pay

## 2021-11-18 ENCOUNTER — Encounter: Payer: Self-pay | Admitting: Orthopedic Surgery

## 2021-11-18 ENCOUNTER — Ambulatory Visit: Payer: Self-pay

## 2021-11-18 ENCOUNTER — Other Ambulatory Visit: Payer: Self-pay

## 2021-11-18 DIAGNOSIS — M25561 Pain in right knee: Secondary | ICD-10-CM

## 2021-11-18 MED ORDER — TEMAZEPAM 15 MG PO CAPS: 15.0000 mg | ORAL_CAPSULE | Freq: Every evening | ORAL | 0 refills | Status: DC | PRN

## 2021-11-18 MED ORDER — TEMAZEPAM 15 MG PO CAPS
15.0000 mg | ORAL_CAPSULE | Freq: Every evening | ORAL | 0 refills | Status: DC | PRN
  Filled 2021-11-18: qty 30, 30d supply, fill #0

## 2021-11-19 DIAGNOSIS — M25561 Pain in right knee: Secondary | ICD-10-CM | POA: Diagnosis not present

## 2021-11-19 DIAGNOSIS — R2689 Other abnormalities of gait and mobility: Secondary | ICD-10-CM | POA: Diagnosis not present

## 2021-11-19 DIAGNOSIS — M6281 Muscle weakness (generalized): Secondary | ICD-10-CM | POA: Diagnosis not present

## 2021-11-21 ENCOUNTER — Encounter: Payer: Self-pay | Admitting: Orthopedic Surgery

## 2021-11-21 NOTE — Progress Notes (Signed)
Post-Op Visit Note   Patient: Christine Brady           Date of Birth: Aug 20, 1961           MRN: [id number] Visit Date: 11/18/2021 PCP: Ronita Hipps, MD   Assessment & Plan:  Chief Complaint:  Chief Complaint  Patient presents with   Right Knee - Post-op Follow-up   Visit Diagnoses:  1. Right knee pain, unspecified chronicity     Plan: [name] is a 60 year old patient who underwent right knee arthroscopy and medial patellofemoral ligament reconstruction 10/05/2021.  Reports some aching pain at night.  She has had takes medication again.  Denies any fevers or chills.  She is going to physical therapy.  Developed a rash in the right lower leg anteriorly last week.  She does have a history of ITP.  She has returned to work 4 hours a day.  On examination she has range of motion to 90 degrees with good patellar stability.  No effusion.  No calf tenderness.  Has a macular rash on the right lower extremity which is not tender or raised.  Unclear exactly what that is coming from but I think it could be related to her ITP.  Plan at this time is to really continue in therapy 3 times a week with home exercise program twice a day to focus on increasing range of motion.  I think with increased range of motion the knee will loosen up some.  Some of the pain she is having in the back of her knee is likely related to the meniscal pathology she had on the lateral side which is significant.  We examined her knee last time for any type of Baker's cyst none was present on ultrasound examination and none is present today on physical examination.  Follow-Up Instructions: No follow-ups on file.   Orders:  Orders Placed This Encounter  Procedures   XR KNEE 3 VIEW RIGHT   Meds ordered this encounter  Medications   DISCONTD: temazepam (RESTORIL) 15 MG capsule    Sig: Take 1 capsule (15 mg total) by mouth at bedtime as needed for sleep.    Dispense:  30 capsule    Refill:  0   temazepam (RESTORIL) 15 MG  capsule    Sig: Take 1 capsule (15 mg total) by mouth at bedtime as needed for sleep.    Dispense:  30 capsule    Refill:  0    Imaging: No results found.  PMFS History: Patient Active Problem List   Diagnosis Date Noted   Patellar instability of right knee    Complex tear of lateral meniscus of right knee    Loose body in knee, right knee    Constipation 06/05/2021   Diverticular disease of colon 06/05/2021   Family history of other diseases of the digestive system 06/05/2021   Nausea 06/05/2021   Otalgia, left 06/05/2021   Right upper quadrant pain 06/05/2021   Acute lateral meniscus tear of right knee 06/05/2021   Patellar dislocation, right, sequela 06/05/2021   Diplacusis of left ear 10/02/2020   History of right mastoidectomy 04/10/2020   Postop check 02/29/2020   Bowel obstruction ([place]) 09/28/2019   Idiopathic thrombocytopenia ([place]) 09/28/2019   Eustachian tube dysfunction, bilateral 09/28/2019   Tinnitus aurium, bilateral 09/28/2019   Tympanic membrane central perforation, 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   Unspecified cholesteatoma, left ear 09/28/2019   IBS (irritable bowel syndrome) 07/09/2019   Headache 07/09/2019   GERD (gastroesophageal reflux disease)  07/09/2019   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 07/03/2019 07/03/2019   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   Menopausal syndrome 03/13/2018   History of postoperative nausea 01/19/2017   Atypical chest pain 11/11/2016   Hyperlipidemia 11/11/2016   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[place]) 11/11/2016   Epidermoid cyst on back 10/28/2011   Past Medical History:  Diagnosis Date   Abdominal pain    Arthritis    difficulty walking   Atypical chest pain 11/11/2016   Bowel obstruction ([facility])    Cholesteatoma of right ear    Chronic headaches    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[place]) 11/11/2016   Cyst    back   Hearing aid worn    History of ITP    Hyperlipidemia 11/11/2016   Large ovary  07/09/2019   PONV (postoperative nausea and vomiting)    Reflux     Family History  Problem Relation Age of Onset   Diabetes Mother    Asthma Mother    Diabetes type II Mother    Alcohol abuse Mother    Hypertension Mother    Diabetes Father    Hypertension Father    Kidney disease Father    Diabetes type II Father    Alcohol abuse Father    Crohn's disease Sister    Cerebral palsy Brother    COPD Sister    Thrombocytopenia Sister     Past Surgical History:  Procedure Laterality Date   CHOLECYSTECTOMY  2010   ESOPHAGOGASTRODUODENOSCOPY (EGD) WITH PROPOFOL N/A 06/12/2019   Procedure: ESOPHAGOGASTRODUODENOSCOPY (EGD) WITH PROPOFOL;  Surgeon: Carol Ada, MD;  [facility];  Service: Endoscopy;  Laterality: N/A;   EXTERNAL EAR SURGERY  2009/2010   EXTERNAL EAR SURGERY  2010   right ear   KNEE ARTHROSCOPY     right   KNEE ARTHROSCOPY WITH MEDIAL PATELLAR FEMORAL LIGAMENT RECONSTRUCTION Right 10/05/2021   Procedure: right knee arthroscoy, debridement, lateral menisectomy, medial patellar femoral ligament reconstruction;  Surgeon: Meredith Pel, MD;  Location: [facility];  Service: Orthopedics;  Laterality: Right;   KNEE LIGAMENT RECONSTRUCTION     left   MASTOIDECTOMY Right 01/19/2017   Procedure: RIGHT MODIFIED RADICAL MASTOIDECTOMY;  Surgeon: [place]ie Mutters, MD;  Location: [facility];  Service: ENT;  Laterality: Right;   UPPER ESOPHAGEAL ENDOSCOPIC ULTRASOUND (EUS) N/A 06/12/2019   Procedure: UPPER ESOPHAGEAL ENDOSCOPIC ULTRASOUND (EUS);  Surgeon: Carol Ada, MD;  Location: [facility];  Service: Endoscopy;  Laterality: N/A;   WISDOM TOOTH EXTRACTION     Social History   Occupational History   Not on file  Tobacco Use   Smoking status: Never   Smokeless tobacco: Never  Vaping Use   Vaping Use: Never used  Substance and Sexual Activity   Alcohol use: Yes    Comment: occas   Drug use: No   Sexual activity: Not on file

## 2021-11-23 DIAGNOSIS — M6281 Muscle weakness (generalized): Secondary | ICD-10-CM | POA: Diagnosis not present

## 2021-11-23 DIAGNOSIS — R2689 Other abnormalities of gait and mobility: Secondary | ICD-10-CM | POA: Diagnosis not present

## 2021-11-23 DIAGNOSIS — M25561 Pain in right knee: Secondary | ICD-10-CM | POA: Diagnosis not present

## 2021-11-24 ENCOUNTER — Telehealth: Payer: Self-pay | Admitting: Orthopedic Surgery

## 2021-11-24 NOTE — Telephone Encounter (Signed)
[place] forms received. To [employer]. ?

## 2021-11-26 DIAGNOSIS — M25561 Pain in right knee: Secondary | ICD-10-CM | POA: Diagnosis not present

## 2021-11-26 DIAGNOSIS — R2689 Other abnormalities of gait and mobility: Secondary | ICD-10-CM | POA: Diagnosis not present

## 2021-11-26 DIAGNOSIS — M6281 Muscle weakness (generalized): Secondary | ICD-10-CM | POA: Diagnosis not present

## 2021-11-27 DIAGNOSIS — M25561 Pain in right knee: Secondary | ICD-10-CM | POA: Diagnosis not present

## 2021-11-27 DIAGNOSIS — M6281 Muscle weakness (generalized): Secondary | ICD-10-CM | POA: Diagnosis not present

## 2021-11-27 DIAGNOSIS — R2689 Other abnormalities of gait and mobility: Secondary | ICD-10-CM | POA: Diagnosis not present

## 2021-11-30 DIAGNOSIS — M25561 Pain in right knee: Secondary | ICD-10-CM | POA: Diagnosis not present

## 2021-11-30 DIAGNOSIS — M6281 Muscle weakness (generalized): Secondary | ICD-10-CM | POA: Diagnosis not present

## 2021-11-30 DIAGNOSIS — R2689 Other abnormalities of gait and mobility: Secondary | ICD-10-CM | POA: Diagnosis not present

## 2021-12-01 ENCOUNTER — Other Ambulatory Visit (HOSPITAL_COMMUNITY): Payer: Self-pay

## 2021-12-01 ENCOUNTER — Other Ambulatory Visit: Payer: Self-pay | Admitting: Orthopedic Surgery

## 2021-12-01 ENCOUNTER — Other Ambulatory Visit: Payer: Self-pay | Admitting: Surgical

## 2021-12-01 DIAGNOSIS — Z9889 Other specified postprocedural states: Secondary | ICD-10-CM

## 2021-12-01 MED ORDER — METHOCARBAMOL 500 MG PO TABS
500.0000 mg | ORAL_TABLET | Freq: Two times a day (BID) | ORAL | 0 refills | Status: DC | PRN
  Filled 2021-12-01: qty 40, 20d supply, fill #0

## 2021-12-01 MED ORDER — GABAPENTIN 100 MG PO CAPS
100.0000 mg | ORAL_CAPSULE | Freq: Three times a day (TID) | ORAL | 0 refills | Status: DC
  Filled 2021-12-01: qty 90, 30d supply, fill #0

## 2021-12-01 MED ORDER — CELECOXIB 100 MG PO CAPS
100.0000 mg | ORAL_CAPSULE | Freq: Two times a day (BID) | ORAL | 0 refills | Status: DC
  Filled 2021-12-01: qty 60, 30d supply, fill #0

## 2021-12-01 NOTE — Telephone Encounter (Signed)
Please advise 

## 2021-12-02 ENCOUNTER — Other Ambulatory Visit (HOSPITAL_COMMUNITY): Payer: Self-pay

## 2021-12-02 DIAGNOSIS — M6281 Muscle weakness (generalized): Secondary | ICD-10-CM | POA: Diagnosis not present

## 2021-12-02 DIAGNOSIS — M25561 Pain in right knee: Secondary | ICD-10-CM | POA: Diagnosis not present

## 2021-12-02 DIAGNOSIS — R2689 Other abnormalities of gait and mobility: Secondary | ICD-10-CM | POA: Diagnosis not present

## 2021-12-08 DIAGNOSIS — M25561 Pain in right knee: Secondary | ICD-10-CM | POA: Diagnosis not present

## 2021-12-08 DIAGNOSIS — Z7901 Long term (current) use of anticoagulants: Secondary | ICD-10-CM | POA: Diagnosis not present

## 2021-12-08 DIAGNOSIS — R2689 Other abnormalities of gait and mobility: Secondary | ICD-10-CM | POA: Diagnosis not present

## 2021-12-08 DIAGNOSIS — Z6825 Body mass index (BMI) 25.0-25.9, adult: Secondary | ICD-10-CM | POA: Diagnosis not present

## 2021-12-08 DIAGNOSIS — E538 Deficiency of other specified B group vitamins: Secondary | ICD-10-CM | POA: Diagnosis not present

## 2021-12-08 DIAGNOSIS — R233 Spontaneous ecchymoses: Secondary | ICD-10-CM | POA: Diagnosis not present

## 2021-12-08 DIAGNOSIS — M6281 Muscle weakness (generalized): Secondary | ICD-10-CM | POA: Diagnosis not present

## 2021-12-09 ENCOUNTER — Other Ambulatory Visit (HOSPITAL_COMMUNITY)
Admit: 2021-12-09 | Discharge: 2021-12-09 | Disposition: A | Discharge status: Home / Self Care | Payer: 59 | Source: Ambulatory Visit | Attending: *Deleted | Admitting: *Deleted

## 2021-12-09 ENCOUNTER — Other Ambulatory Visit (HOSPITAL_COMMUNITY): Payer: Self-pay

## 2021-12-09 ENCOUNTER — Other Ambulatory Visit (HOSPITAL_COMMUNITY): Payer: Self-pay | Admitting: Family Medicine

## 2021-12-09 ENCOUNTER — Other Ambulatory Visit: Payer: Self-pay

## 2021-12-09 ENCOUNTER — Ambulatory Visit (HOSPITAL_COMMUNITY)
Admission: RE | Admit: 2021-12-09 | Discharge: 2021-12-09 | Disposition: A | Discharge status: Home / Self Care | Payer: 59 | Source: Ambulatory Visit | Attending: Family Medicine | Admitting: Family Medicine

## 2021-12-09 DIAGNOSIS — M79669 Pain in unspecified lower leg: Secondary | ICD-10-CM | POA: Diagnosis not present

## 2021-12-09 DIAGNOSIS — M7989 Other specified soft tissue disorders: Secondary | ICD-10-CM | POA: Insufficient documentation

## 2021-12-09 DIAGNOSIS — D65 Disseminated intravascular coagulation [defibrination syndrome]: Secondary | ICD-10-CM | POA: Insufficient documentation

## 2021-12-09 DIAGNOSIS — R233 Spontaneous ecchymoses: Secondary | ICD-10-CM | POA: Insufficient documentation

## 2021-12-09 LAB — PROTIME-INR
INR: 0.9 (ref 0.8–1.2)
Prothrombin Time: 12.1 seconds (ref 11.4–15.2)

## 2021-12-09 LAB — D-DIMER, QUANTITATIVE: D-Dimer, Quant: 0.65 ug/mL-FEU — ABNORMAL HIGH (ref 0.00–0.50)

## 2021-12-09 LAB — FIBRINOGEN: Fibrinogen: 254 mg/dL (ref 210–475)

## 2021-12-09 LAB — PLATELET COUNT: Platelets: 112 10*3/uL — ABNORMAL LOW (ref 150–400)

## 2021-12-09 LAB — APTT: aPTT: 26 seconds (ref 24–36)

## 2021-12-09 MED ORDER — ELIQUIS 5 MG PO TABS
ORAL_TABLET | ORAL | 0 refills | Status: DC
  Filled 2021-12-09: qty 70, 30d supply, fill #0

## 2021-12-09 MED ORDER — PREDNISONE 10 MG (21) PO TBPK
ORAL_TABLET | ORAL | 0 refills | Status: DC
  Filled 2021-12-09: qty 21, 6d supply, fill #0

## 2021-12-09 NOTE — Progress Notes (Signed)
Bilateral lower extremity venous duplex completed. Refer to "CV Proc" under chart review to view preliminary results.  Preliminary results discussed with Dr. Lin Landsman.  12/09/2021 3:10 PM Kelby Aline., MHA, RVT, RDCS, RDMS

## 2021-12-10 ENCOUNTER — Telehealth: Payer: Self-pay | Admitting: Radiology

## 2021-12-10 NOTE — Telephone Encounter (Signed)
IC and discussed with patient per our conversation however, patient is requesting you to call her to discuss.

## 2021-12-10 NOTE — Telephone Encounter (Signed)
Patient called. PCP placed her on meds due to a positive doppler. She is not sure where go from this point since she is off her gabapentin and her feet are still numb. This is a Scientist, physiological patient.

## 2021-12-10 NOTE — Telephone Encounter (Signed)
I have messaged Dr Marlou Sa asking him to advise-

## 2021-12-11 ENCOUNTER — Other Ambulatory Visit: Payer: Self-pay

## 2021-12-11 DIAGNOSIS — M25561 Pain in right knee: Secondary | ICD-10-CM

## 2021-12-11 NOTE — Progress Notes (Signed)
Patient called stated Dr Marlou Sa had wanted her to get an MRI of her right knee to evaluate post surgical changes. I placed order for this and Gabriel Cirri will coordinate with patient to schedule.

## 2021-12-12 ENCOUNTER — Ambulatory Visit (HOSPITAL_COMMUNITY)
Admission: RE | Admit: 2021-12-12 | Discharge: 2021-12-12 | Disposition: A | Discharge status: Home / Self Care | Payer: 59 | Source: Ambulatory Visit | Attending: Orthopedic Surgery | Admitting: Orthopedic Surgery

## 2021-12-12 ENCOUNTER — Other Ambulatory Visit: Payer: Self-pay

## 2021-12-12 DIAGNOSIS — Z9889 Other specified postprocedural states: Secondary | ICD-10-CM | POA: Diagnosis not present

## 2021-12-12 DIAGNOSIS — M25561 Pain in right knee: Secondary | ICD-10-CM | POA: Diagnosis not present

## 2021-12-12 DIAGNOSIS — M25461 Effusion, right knee: Secondary | ICD-10-CM | POA: Diagnosis not present

## 2021-12-12 DIAGNOSIS — M1711 Unilateral primary osteoarthritis, right knee: Secondary | ICD-10-CM | POA: Diagnosis not present

## 2021-12-12 DIAGNOSIS — M79669 Pain in unspecified lower leg: Secondary | ICD-10-CM | POA: Diagnosis not present

## 2021-12-14 DIAGNOSIS — I82409 Acute embolism and thrombosis of unspecified deep veins of unspecified lower extremity: Secondary | ICD-10-CM | POA: Diagnosis present

## 2021-12-15 DIAGNOSIS — Z86718 Personal history of other venous thrombosis and embolism: Secondary | ICD-10-CM | POA: Diagnosis not present

## 2021-12-15 NOTE — Telephone Encounter (Signed)
I called.  She is on blood thinners now but her pain is not really improving from behind the knee.  Looked at her imaging and 4 years ago she had MRI scan on her lumbar spine.  I think that may be worth repeating with all this posterior knee pain with no Baker's cyst partial lateral meniscectomy and no other real reason to explain her pain.

## 2021-12-16 ENCOUNTER — Other Ambulatory Visit: Payer: Self-pay

## 2021-12-16 ENCOUNTER — Encounter: Payer: Self-pay | Admitting: Orthopedic Surgery

## 2021-12-16 ENCOUNTER — Ambulatory Visit (INDEPENDENT_AMBULATORY_CARE_PROVIDER_SITE_OTHER): Payer: 59 | Admitting: Orthopedic Surgery

## 2021-12-16 ENCOUNTER — Ambulatory Visit (INDEPENDENT_AMBULATORY_CARE_PROVIDER_SITE_OTHER): Payer: 59

## 2021-12-16 DIAGNOSIS — M25561 Pain in right knee: Secondary | ICD-10-CM

## 2021-12-16 DIAGNOSIS — M545 Low back pain, unspecified: Secondary | ICD-10-CM

## 2021-12-17 ENCOUNTER — Encounter: Payer: Self-pay | Admitting: Orthopedic Surgery

## 2021-12-17 NOTE — Progress Notes (Signed)
Post-Op Visit Note   Patient: Christine Brady           Date of Birth: [birth year]           MRN: [id number] Visit Date: 12/16/2021 PCP: Ronita Hipps, MD   Assessment & Plan:  Chief Complaint:  Chief Complaint  Patient presents with   Right Knee - Routine Post Op   Visit Diagnoses:  1. Low back pain, unspecified back pain laterality, unspecified chronicity, unspecified whether sciatica present   2. Right knee pain, unspecified chronicity     Plan: Patient is a 61 year old female who presents s/p right knee arthroscopy with lateral meniscectomy, MPFL reconstruction on 10/05/2021.  She returns to discuss right knee MRI that was done on 12/12/2021 for reevaluation of potential contributors to her right knee pain.  She was concerned primarily about a Baker's cyst which is not present on her recent MRI scan.  She has MPFL reconstruction that is intact with no recurrent meniscal tear.  The majority of her pain is posterior in the knee.  She also notes increasing pain in her low back in the last 3 weeks.  She has had numbness/tingling through the dorsal and plantar aspects of bilateral feet since about 3 weeks after the surgery with nothing she can really correlate this with.  She has a history of a prior low back injury years ago about 2005 or 2006 when she fell off a roof onto her back..  She did not have surgery at that time.  She has not had any recent MRI scan within the last several years.  She is also recently been diagnosed with a DVT which she states that she is seeing her PCP for.  She is currently on Eliquis for treatment of the DVT.  She is taking over-the-counter Tylenol and meloxicam without relief of her symptoms.  She did try discontinuing her gabapentin without any relief of her numbness/tingling.  Ortho exam demonstrates right knee with well-healed incisions.  No significant effusion is present.  MPFL is intact on stressing of the ligament on exam.  No calf tenderness, negative  Homans' sign on exam today.  Significantly decreased sensation through the dorsum and plantar aspects of the bilateral feet that do not correlate with any specific nerve distribution.  Normal sensation from the distal shin/calf.  5/5 motor strength of bilateral hip flexion, quadricep, hamstring, dorsiflexion, plantarflexion, EHL.  Increased pain with flexion and extension of the lumbar spine.  Tenderness over the axial lumbar spine diffusely.  Plan is evaluation of lumbar spine with MRI scan.  She has had continued numbness and tingling as well as increasing pain in the lumbar spine with a history of a severe back injury and no recent MRI scan.  She has been tried therapy exercises for her back at home that she is used in the past with good relief but currently her exercises are not giving her any relief.  She states that she has been referred to a neurologist by her PCP for evaluation of the bilateral foot numbness and tingling but it is important to rule out the back as a cause of her knee pain/increased back pain/bilateral foot numbness/tingling.  Also referred her to physical therapy upstairs for her right knee.  Follow-Up Instructions: No follow-ups on file.   Orders:  Orders Placed This Encounter  Procedures   XR Lumbar Spine 2-3 Views   MR Lumbar Spine w/o contrast   Ambulatory referral to Physical Therapy   No orders  of the defined types were placed in this encounter.   Imaging: No results found.  PMFS History: Patient Active Problem List   Diagnosis Date Noted   Patellar instability of right knee    Complex tear of lateral meniscus of right knee    Loose body in knee, right knee    Constipation 06/05/2021   Diverticular disease of colon 06/05/2021   Family history of other diseases of the digestive system 06/05/2021   Nausea 06/05/2021   Otalgia, left 06/05/2021   Right upper quadrant pain 06/05/2021   Acute lateral meniscus tear of right knee 06/05/2021   Patellar  dislocation, right, sequela 06/05/2021   Diplacusis of left ear 10/02/2020   History of right mastoidectomy 04/10/2020   Postop check 02/29/2020   Bowel obstruction ([place]) 09/28/2019   Idiopathic thrombocytopenia ([place]) 09/28/2019   Eustachian tube dysfunction, bilateral 09/28/2019   Tinnitus aurium, bilateral 09/28/2019   Tympanic membrane central perforation, 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   Unspecified cholesteatoma, left ear 09/28/2019   IBS (irritable bowel syndrome) 07/09/2019   Headache 07/09/2019   GERD (gastroesophageal reflux disease) 07/09/2019   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 07/03/2019 07/03/2019   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   Menopausal syndrome 03/13/2018   History of postoperative nausea 01/19/2017   Atypical chest pain 11/11/2016   Hyperlipidemia 11/11/2016   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[place]) 11/11/2016   Epidermoid cyst on back 10/28/2011   Past Medical History:  Diagnosis Date   Abdominal pain    Arthritis    difficulty walking   Atypical chest pain 11/11/2016   Bowel obstruction ([facility])    Cholesteatoma of right ear    Chronic headaches    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[place]) 11/11/2016   Cyst    back   Hearing aid worn    History of ITP    Hyperlipidemia 11/11/2016   Large ovary 07/09/2019   PONV (postoperative nausea and vomiting)    Reflux     Family History  Problem Relation Age of Onset   Diabetes Mother    Asthma Mother    Diabetes type II Mother    Alcohol abuse Mother    Hypertension Mother    Diabetes Father    Hypertension Father    Kidney disease Father    Diabetes type II Father    Alcohol abuse Father    Crohn's disease Sister    Cerebral palsy Brother    COPD Sister    Thrombocytopenia Sister     Past Surgical History:  Procedure Laterality Date   CHOLECYSTECTOMY  2010   ESOPHAGOGASTRODUODENOSCOPY (EGD) WITH PROPOFOL N/A 06/12/2019   Procedure: ESOPHAGOGASTRODUODENOSCOPY (EGD) WITH PROPOFOL;   Surgeon: Carol Ada, MD;  Location: [facility];  Service: Endoscopy;  Laterality: N/A;   EXTERNAL EAR SURGERY  2009/2010   EXTERNAL EAR SURGERY  2010   right ear   KNEE ARTHROSCOPY     right   KNEE ARTHROSCOPY WITH MEDIAL PATELLAR FEMORAL LIGAMENT RECONSTRUCTION Right 10/05/2021   Procedure: right knee arthroscoy, debridement, lateral menisectomy, medial patellar femoral ligament reconstruction;  Surgeon: Meredith Pel, MD;  Location: [facility];  Service: Orthopedics;  Laterality: Right;   KNEE LIGAMENT RECONSTRUCTION     left   MASTOIDECTOMY Right 01/19/2017   Procedure: RIGHT MODIFIED RADICAL MASTOIDECTOMY;  Surgeon: Vicie Mutters, MD;  Location: [facility];  Service: ENT;  Laterality: Right;   UPPER ESOPHAGEAL ENDOSCOPIC ULTRASOUND (EUS) N/A 06/12/2019   Procedure: UPPER ESOPHAGEAL ENDOSCOPIC ULTRASOUND (EUS);  Surgeon: Carol Ada, MD;  Location: [facility];  Service: Endoscopy;  Laterality: N/A;   WISDOM TOOTH EXTRACTION     Social History   Occupational History   Not on file  Tobacco Use   Smoking status: Never   Smokeless tobacco: Never  Vaping Use   Vaping Use: Never used  Substance and Sexual Activity   Alcohol use: Yes    Comment: occas   Drug use: No   Sexual activity: Not on file

## 2021-12-21 ENCOUNTER — Encounter: Payer: Self-pay | Admitting: Orthopedic Surgery

## 2021-12-21 ENCOUNTER — Other Ambulatory Visit (HOSPITAL_COMMUNITY)
Admit: 2021-12-21 | Discharge: 2021-12-21 | Disposition: A | Discharge status: Home / Self Care | Payer: 59 | Source: Ambulatory Visit | Attending: Physician Assistant | Admitting: Physician Assistant

## 2021-12-21 ENCOUNTER — Other Ambulatory Visit (HOSPITAL_COMMUNITY): Payer: Self-pay

## 2021-12-21 DIAGNOSIS — I82409 Acute embolism and thrombosis of unspecified deep veins of unspecified lower extremity: Secondary | ICD-10-CM | POA: Insufficient documentation

## 2021-12-21 DIAGNOSIS — D519 Vitamin B12 deficiency anemia, unspecified: Secondary | ICD-10-CM | POA: Diagnosis not present

## 2021-12-21 LAB — D-DIMER, QUANTITATIVE: D-Dimer, Quant: <0.27 ug/mL-FEU (ref 0.00–0.50)

## 2021-12-21 MED ORDER — CEPHALEXIN 500 MG PO CAPS
ORAL_CAPSULE | ORAL | 0 refills | Status: DC
  Filled 2021-12-21: qty 14, 7d supply, fill #0

## 2021-12-21 MED ORDER — TRIAMCINOLONE ACETONIDE 0.1 % EX OINT
TOPICAL_OINTMENT | CUTANEOUS | 0 refills | Status: DC
  Filled 2021-12-21: qty 454, 30d supply, fill #0

## 2021-12-22 ENCOUNTER — Other Ambulatory Visit: Payer: Self-pay | Admitting: *Deleted

## 2021-12-22 DIAGNOSIS — D693 Immune thrombocytopenic purpura: Secondary | ICD-10-CM

## 2021-12-22 NOTE — Progress Notes (Signed)
New patient appt with Dr Benay Spice and labs same day. Lab orders entered

## 2021-12-22 NOTE — Telephone Encounter (Signed)
Might be helpful to see pictures of what she is talking about but do not think that is necessarily due to her knee surgery based on the fact that it is affecting both legs.  Looks like her PCP prescribed triamcinolone ointment and Keflex so I would say lets take a look at the pictures and just see how she does with those interventions.

## 2021-12-23 ENCOUNTER — Telehealth: Payer: Self-pay | Admitting: Oncology

## 2021-12-23 NOTE — Telephone Encounter (Signed)
Attempted to contact patient to schedule initial appointment per Referral. Left voicemail for patient to call back to proceed scheduling.

## 2021-12-28 ENCOUNTER — Other Ambulatory Visit: Payer: Self-pay

## 2021-12-28 ENCOUNTER — Encounter: Payer: Self-pay | Admitting: Physical Therapy

## 2021-12-28 ENCOUNTER — Ambulatory Visit: Payer: 59 | Admitting: Physical Therapy

## 2021-12-28 DIAGNOSIS — R600 Localized edema: Secondary | ICD-10-CM | POA: Diagnosis not present

## 2021-12-28 DIAGNOSIS — M25661 Stiffness of right knee, not elsewhere classified: Secondary | ICD-10-CM | POA: Diagnosis not present

## 2021-12-28 DIAGNOSIS — M25561 Pain in right knee: Secondary | ICD-10-CM | POA: Diagnosis not present

## 2021-12-28 DIAGNOSIS — M6281 Muscle weakness (generalized): Secondary | ICD-10-CM

## 2021-12-28 DIAGNOSIS — R262 Difficulty in walking, not elsewhere classified: Secondary | ICD-10-CM | POA: Diagnosis not present

## 2021-12-28 DIAGNOSIS — G8929 Other chronic pain: Secondary | ICD-10-CM

## 2021-12-28 NOTE — Patient Instructions (Addendum)
°  Access Code: [email or web address] URL: [email or web address] Date: 12/28/2021 Prepared by: Kearney Hard

## 2021-12-28 NOTE — Therapy (Signed)
[facility] [place], [place], [address] Phone: [phone]   Fax:  [phone]  Physical Therapy Evaluation  Patient Details  Name: Christine Brady MRN: [id number] Date of Birth: 11-16-1961 Referring Provider (PT): Marcene Duos   Encounter Date: 12/28/2021   PT End of Session - 12/28/21 1655     Visit Number 1    Number of Visits 13    Date for PT Re-Evaluation 02/12/22    Authorization Type [employer] [employer]    Progress Note Due on Visit 10    PT Start Time 1515    PT Stop Time 1603    PT Time Calculation (min) 48 min    Activity Tolerance Patient tolerated treatment well    Behavior During Therapy [facility] for tasks assessed/performed             Past Medical History:  Diagnosis Date   Abdominal pain    Arthritis    difficulty walking   Atypical chest pain 11/11/2016   Bowel obstruction ([place])    Cholesteatoma of right ear    Chronic headaches    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[place]) 11/11/2016   Cyst    back   Hearing aid worn    History of ITP    Hyperlipidemia 11/11/2016   Large ovary 07/09/2019   PONV (postoperative nausea and vomiting)    Reflux     Past Surgical History:  Procedure Laterality Date   CHOLECYSTECTOMY  2010   ESOPHAGOGASTRODUODENOSCOPY (EGD) WITH PROPOFOL N/A 06/12/2019   Procedure: ESOPHAGOGASTRODUODENOSCOPY (EGD) WITH PROPOFOL;  Surgeon: Carol Ada, MD;  Location: [facility];  Service: Endoscopy;  Laterality: N/A;   EXTERNAL EAR SURGERY  2009/2010   EXTERNAL EAR SURGERY  2010   right ear   KNEE ARTHROSCOPY     right   KNEE ARTHROSCOPY WITH MEDIAL PATELLAR FEMORAL LIGAMENT RECONSTRUCTION Right 10/05/2021   Procedure: right knee arthroscoy, debridement, lateral menisectomy, medial patellar femoral ligament reconstruction;  Surgeon: Meredith Pel, MD;  Location: [facility];  Service: Orthopedics;  Laterality: Right;   KNEE LIGAMENT RECONSTRUCTION     left    MASTOIDECTOMY Right 01/19/2017   Procedure: RIGHT MODIFIED RADICAL MASTOIDECTOMY;  Surgeon: Vicie Mutters, MD;  Location: [facility];  Service: ENT;  Laterality: Right;   UPPER ESOPHAGEAL ENDOSCOPIC ULTRASOUND (EUS) N/A 06/12/2019   Procedure: UPPER ESOPHAGEAL ENDOSCOPIC ULTRASOUND (EUS);  Surgeon: Carol Ada, MD;  Location: [facility];  Service: Endoscopy;  Laterality: N/A;   WISDOM TOOTH EXTRACTION      There were no vitals filed for this visit.    Subjective Assessment - 12/28/21 1642     Subjective Pt arriving today with 4/10 right knee pain s/p knee arthroscopy with lateral menisectomy on 10/05/2021. Pt reporting recent history of DVT and had to hold her previous physical therapy for her knee following surgery. Pt was placed on blood thinners and no clot detected at her last MD visit. Pt also reporting chronic history of LBP which she thinks maybe causing the numbness in bilateral feet. Pt stating her balance has been off due to not being able to feel her feet. MRI to be scheduled.    Pertinent History : rt knee arthroscopy with lateral menisectomy, 10/05/2021  Hearing aid, reflux, arthritis, bowl obstruction, h/o baker's cyst, hyperlipidemia    Limitations Other (comment);Writing;House hold activities;Lifting;Standing    Patient Stated Goals Get back to working in my yard and stop hurting    Currently in Pain? Yes  Pain Score 4     Pain Location Knee    Pain Orientation Right    Pain Descriptors / Indicators Aching;Sore;Tightness    Pain Type Surgical pain    Pain Onset More than a month ago    Pain Frequency Constant    Aggravating Factors  bending. standing prolonged    Pain Relieving Factors resting, change positions, riding my bike    Effect of Pain on Daily Activities difficulty sleeping, working, yard work,                [facility] PT Assessment - 12/28/21 0001       Assessment   Medical Diagnosis M25.561 right knee pain    Referring Provider (PT)  Marcene Duos    Onset Date/Surgical Date 10/05/21    Hand Dominance Right    Prior Therapy yes      Precautions   Precautions None      Restrictions   Weight Bearing Restrictions No      Balance Screen   Has the patient fallen in the past 6 months No    Is the patient reluctant to leave their home because of a fear of falling?  No      Home Environment   Living Environment Private residence    Living Arrangements Alone    Type of [place] to enter    Entrance Stairs-Number of Steps 2    Entrance Stairs-Rails None    Home Layout One level      Prior Function   Level of Independence Independent    Vocation Full time employment    Leisure being out side      Cognition   Overall Cognitive Status Within Functional Limits for tasks assessed      Observation/Other Assessments   Focus on Therapeutic Outcomes (FOTO)  37% (predicted 60%)      Posture/Postural Control   Posture/Postural Control Postural limitations    Postural Limitations Rounded Shoulders;Forward head      ROM / Strength   AROM / PROM / Strength AROM;PROM;Strength      AROM   AROM Assessment Site Knee    Right/Left Knee Right;Left    Right Knee Extension -5    Right Knee Flexion 80    Left Knee Extension 0    Left Knee Flexion 145      PROM   PROM Assessment Site Knee    Right/Left Knee Right    Right Knee Extension -2    Right Knee Flexion 90      Strength   Strength Assessment Site Knee    Right/Left Knee Right;Left    Right Knee Flexion 3+/5    Right Knee Extension 3/5    Left Knee Flexion 5/5    Left Knee Extension 5/5      Palpation   Palpation comment TTP right :semitendinos tendon, posterior knee      Transfers   Five time sit to stand comments  15 seconds with no UE support      Ambulation/Gait   Gait Comments amb with no device with step through gait pattern with decreased wt shifting to right LE and decreased heel strike on right compared to left                         Objective measurements completed on examination: See above findings.       [facility] Adult PT Treatment/Exercise - 12/28/21  0001       Exercises   Exercises Knee/Hip      Knee/Hip Exercises: Stretches   Sports administrator 2 reps;10 Psychologist, educational Limitations prone with strap      Knee/Hip Exercises: Standing   Heel Raises 10 reps    Heel Raises Limitations toe raises with UE support      Knee/Hip Exercises: Seated   Long Arc Quad Strengthening;5 reps    Other Seated Knee/Hip Exercises DF strengthening using Level 2 theraband x 5    Sit to Sand 5 reps;without UE support      Knee/Hip Exercises: Supine   Heel Slides AROM;5 reps                     PT Education - 12/28/21 1653     Education Details PT POC, HEP    Person(s) Educated Patient    Methods Explanation;Demonstration;Handout;Verbal cues;Tactile cues    Comprehension Verbalized understanding;Returned demonstration              PT Short Term Goals - 12/28/21 1655       PT SHORT TERM GOAL #1   Title Pt will be independent in her initial HEP    Time 3    Period Weeks    Status New    Target Date 01/15/22      PT SHORT TERM GOAL #2   Title Pt will improve her 5 time sit to stand to </= 12 seconds with no UE support.    Time 3    Period Weeks    Status New    Target Date 01/15/22               PT Long Term Goals - 12/28/21 1656       PT LONG TERM GOAL #1   Title Pt will be independent in her advanced HEP.    Time 6    Period Weeks    Status New    Target Date 02/12/22      PT LONG TERM GOAL #2   Title Pt will improve her FOTO score from 37% tp 60%    Time 6    Period Weeks    Status New    Target Date 02/12/22      PT LONG TERM GOAL #3   Title Pt will improve her right knee flexion to >/= 125 degrees in order to improve functional mobility.    Time 6    Period Weeks    Status New    Target Date 02/12/22      PT LONG TERM GOAL #4    Title Pt will be able to improve her right knee extension to 5/5.    Time 6    Period Weeks    Status New    Target Date 02/12/22      PT LONG TERM GOAL #5   Title Pt will be able to work full shift with pain </= 2/10 in her right knee.    Time 6    Period Weeks    Status New    Target Date 02/12/22                    Plan - 12/28/21 1705     Clinical Impression Statement Pt presenting today s/p right knee arthroscopy with lateral menisectomy on 10/05/2021. Pt limited physical therapy initially due to DVT. Pt works as a Education administrator at [employer]. Pt  presenting today with limited ROM in her right knee flexio and extension as well as limitations in her strength. Pt edu in HEP. Pt wearing compression socks to help with swelling of her lower leg. It was recommended pt get thigh high stockings to help with her inflammation in her right knee. Pt reporting posterior knee pain and states she has a history of a Baker's cyst, but it was undected during last image. Pt also reporting numbness in bilateral feet. She statess she has a history of low back pain and and MRI is being ordered. Skilled PT needed to address pts impairments with the below interventions.    Personal Factors and Comorbidities Comorbidity 3+    Comorbidities rt knee arthroscopy with lateral menisectomy, 10/05/2021  Hearing aid, reflux, arthritis, bowl obstruction, h/o baker's cyst, hyperlipidemia    Examination-Activity Limitations Other;Squat;Stairs;Stand;Sleep    Examination-Participation Restrictions Community Activity;Other;Occupation;Yard Work    Stability/Clinical Decision Making Stable/Uncomplicated    Designer, jewellery Low    Rehab Potential Good    PT Frequency 2x / week    PT Duration 6 weeks    PT Treatment/Interventions ADLs/Self Care Home Management;Cryotherapy;Gait training;Stair training;Functional mobility training;Therapeutic activities;Balance training;Therapeutic  exercise;Patient/family education;Manual techniques;Dry needling;Taping;Vasopneumatic Device;Passive range of motion;Joint Manipulations    PT Next Visit Plan bike, knee ROM, strenghtening, dyanmic balance    PT Home Exercise Plan Access Code: [email or web address]  URL: [email or web address]    Consulted and Agree with Plan of Care Patient             Patient will benefit from skilled therapeutic intervention in order to improve the following deficits and impairments:  Pain, Increased edema, Decreased strength, Impaired flexibility, Decreased activity tolerance, Difficulty walking, Decreased range of motion  Visit Diagnosis: Chronic pain of right knee  Muscle weakness (generalized)  Difficulty in walking, not elsewhere classified  Stiffness of right knee, not elsewhere classified  Localized edema     Problem List Patient Active Problem List   Diagnosis Date Noted   Patellar instability of right knee    Complex tear of lateral meniscus of right knee    Loose body in knee, right knee    Constipation 06/05/2021   Diverticular disease of colon 06/05/2021   Family history of other diseases of the digestive system 06/05/2021   Nausea 06/05/2021   Otalgia, left 06/05/2021   Right upper quadrant pain 06/05/2021   Acute lateral meniscus tear of right knee 06/05/2021   Patellar dislocation, right, sequela 06/05/2021   Diplacusis of left ear 10/02/2020   History of right mastoidectomy 04/10/2020   Postop check 02/29/2020   Bowel obstruction ([place]) 09/28/2019   Idiopathic thrombocytopenia ([place]) 09/28/2019   Eustachian tube dysfunction, bilateral 09/28/2019   Tinnitus aurium, bilateral 09/28/2019   Tympanic membrane central perforation, 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   Unspecified cholesteatoma, left ear 09/28/2019   IBS (irritable bowel syndrome) 07/09/2019   Headache 07/09/2019   GERD (gastroesophageal reflux disease) 07/09/2019   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

## 2021-12-29 DIAGNOSIS — E538 Deficiency of other specified B group vitamins: Secondary | ICD-10-CM | POA: Diagnosis not present

## 2021-12-30 ENCOUNTER — Other Ambulatory Visit (HOSPITAL_COMMUNITY): Payer: Self-pay

## 2021-12-30 ENCOUNTER — Encounter: Payer: Self-pay | Admitting: Orthopedic Surgery

## 2021-12-30 MED ORDER — ROSUVASTATIN CALCIUM 5 MG PO TABS
5.0000 mg | ORAL_TABLET | ORAL | 0 refills | Status: DC
  Filled 2021-12-30: qty 25, 88d supply, fill #0

## 2021-12-30 NOTE — Telephone Encounter (Signed)
Emailed AP w [facility] scheduling, they will contact pt to schedule

## 2021-12-31 ENCOUNTER — Ambulatory Visit: Payer: 59 | Admitting: Rehabilitative and Restorative Service Providers"

## 2021-12-31 ENCOUNTER — Other Ambulatory Visit: Payer: Self-pay

## 2021-12-31 ENCOUNTER — Encounter: Payer: Self-pay | Admitting: Rehabilitative and Restorative Service Providers"

## 2021-12-31 DIAGNOSIS — R262 Difficulty in walking, not elsewhere classified: Secondary | ICD-10-CM | POA: Diagnosis not present

## 2021-12-31 DIAGNOSIS — M6281 Muscle weakness (generalized): Secondary | ICD-10-CM | POA: Diagnosis not present

## 2021-12-31 DIAGNOSIS — M25561 Pain in right knee: Secondary | ICD-10-CM | POA: Diagnosis not present

## 2021-12-31 DIAGNOSIS — R600 Localized edema: Secondary | ICD-10-CM | POA: Diagnosis not present

## 2021-12-31 DIAGNOSIS — G8929 Other chronic pain: Secondary | ICD-10-CM | POA: Diagnosis not present

## 2021-12-31 DIAGNOSIS — M25661 Stiffness of right knee, not elsewhere classified: Secondary | ICD-10-CM

## 2021-12-31 NOTE — Therapy (Signed)
[facility] [place], [place], [address] Phone: [phone]   Fax:  [phone]  Physical Therapy Treatment  Patient Details  Name: Christine Brady MRN: [id number] Date of Birth: Sep 10, 1961 Referring Provider (PT): Marcene Duos   Encounter Date: 12/31/2021   PT End of Session - 12/31/21 1507     Visit Number 2    Number of Visits 13    Date for PT Re-Evaluation 02/12/22    Authorization Type [employer]    Progress Note Due on Visit 10    PT Start Time 1512    PT Stop Time 1603    PT Time Calculation (min) 51 min    Activity Tolerance Patient tolerated treatment well    Behavior During Therapy [facility] for tasks assessed/performed             Past Medical History:  Diagnosis Date   Abdominal pain    Arthritis    difficulty walking   Atypical chest pain 11/11/2016   Bowel obstruction ([place])    Cholesteatoma of right ear    Chronic headaches    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[place]) 11/11/2016   Cyst    back   Hearing aid worn    History of ITP    Hyperlipidemia 11/11/2016   Large ovary 07/09/2019   PONV (postoperative nausea and vomiting)    Reflux     Past Surgical History:  Procedure Laterality Date   CHOLECYSTECTOMY  2010   ESOPHAGOGASTRODUODENOSCOPY (EGD) WITH PROPOFOL N/A 06/12/2019   Procedure: ESOPHAGOGASTRODUODENOSCOPY (EGD) WITH PROPOFOL;  Surgeon: Carol Ada, MD;  Location: [facility];  Service: Endoscopy;  Laterality: N/A;   EXTERNAL EAR SURGERY  2009/2010   EXTERNAL EAR SURGERY  2010   right ear   KNEE ARTHROSCOPY     right   KNEE ARTHROSCOPY WITH MEDIAL PATELLAR FEMORAL LIGAMENT RECONSTRUCTION Right 10/05/2021   Procedure: right knee arthroscoy, debridement, lateral menisectomy, medial patellar femoral ligament reconstruction;  Surgeon: Meredith Pel, MD;  Location: [place];  Service: Orthopedics;  Laterality: Right;   KNEE LIGAMENT RECONSTRUCTION     left    MASTOIDECTOMY Right 01/19/2017   Procedure: RIGHT MODIFIED RADICAL MASTOIDECTOMY;  Surgeon: Vicie Mutters, MD;  Location: [place];  Service: ENT;  Laterality: Right;   UPPER ESOPHAGEAL ENDOSCOPIC ULTRASOUND (EUS) N/A 06/12/2019   Procedure: UPPER ESOPHAGEAL ENDOSCOPIC ULTRASOUND (EUS);  Surgeon: Carol Ada, MD;  Location: [facility];  Service: Endoscopy;  Laterality: N/A;   WISDOM TOOTH EXTRACTION      There were no vitals filed for this visit.   Subjective Assessment - 12/31/21 1518     Subjective Pt. indicated feeling stiffness and noticing swelling today.  Indicated pain complaints around 5/10 today throughout the day.  Pt. indicated continued trouble c nighttime and sleeping.    Pertinent History : rt knee arthroscopy with lateral menisectomy, 10/05/2021  Hearing aid, reflux, arthritis, bowl obstruction, h/o baker's cyst, hyperlipidemia    Limitations Other (comment);Writing;House hold activities;Lifting;Standing    Patient Stated Goals Get back to working in my yard and stop hurting    Pain Score 5     Pain Location Knee    Pain Orientation Right;Anterior;Medial   some swelling in posterior   Pain Descriptors / Indicators Aching;Tightness;Sore;Shooting    Pain Onset More than a month ago    Pain Frequency Constant    Aggravating Factors  nighttime, tightness with less movement    Pain Relieving Factors ice, trying to move  to loosen it.                [facility] PT Assessment - 12/31/21 0001       AROM   Right Knee Flexion 92   in supine heel slide                          [facility] Adult PT Treatment/Exercise - 12/31/21 0001       Exercises   Exercises Other Exercises    Other Exercises  Verbal review of HEP not performed in clinic today      Knee/Hip Exercises: Stretches   Gastroc Stretch 30 seconds;3 reps;Both   incline board     Knee/Hip Exercises: Aerobic   Recumbent Bike Lvl 2 6 mins seat 5 (started at 6 for about 1 min)       Knee/Hip Exercises: Machines for Strengthening   Cybex Leg Press Double leg 50 lbs 2 x10, single leg 25 lbs 2 x 10 performed bilateral      Knee/Hip Exercises: Seated   Long Arc Quad Right   reciprocal movement c opposite leg, pause in flexion/extension range 2 x 10     Knee/Hip Exercises: Supine   Heel Slides AROM;2 sets;Right    Other Supine Knee/Hip Exercises supine LAQ in 90 deg hip flexion x 5      Modalities   Modalities Vasopneumatic      Vasopneumatic   Number Minutes Vasopneumatic  10 minutes    Vasopnuematic Location  Knee   Rt   Vasopneumatic Pressure Medium    Vasopneumatic Temperature  34      Manual Therapy   Manual therapy comments seated Rt knee flexion mobilization c movement c IR/distraction and contralateral leg movement opposite.                       PT Short Term Goals - 12/28/21 1655       PT SHORT TERM GOAL #1   Title Pt will be independent in her initial HEP    Time 3    Period Weeks    Status New    Target Date 01/15/22      PT SHORT TERM GOAL #2   Title Pt will improve her 5 time sit to stand to </= 12 seconds with no UE support.    Time 3    Period Weeks    Status New    Target Date 01/15/22               PT Long Term Goals - 12/28/21 1656       PT LONG TERM GOAL #1   Title Pt will be independent in her advanced HEP.    Time 6    Period Weeks    Status New    Target Date 02/12/22      PT LONG TERM GOAL #2   Title Pt will improve her FOTO score from 37% tp 60%    Time 6    Period Weeks    Status New    Target Date 02/12/22      PT LONG TERM GOAL #3   Title Pt will improve her right knee flexion to >/= 125 degrees in order to improve functional mobility.    Time 6    Period Weeks    Status New    Target Date 02/12/22      PT LONG TERM GOAL #4   Title  Pt will be able to improve her right knee extension to 5/5.    Time 6    Period Weeks    Status New    Target Date 02/12/22      PT LONG TERM GOAL #5    Title Pt will be able to work full shift with pain </= 2/10 in her right knee.    Time 6    Period Weeks    Status New    Target Date 02/12/22                   Plan - 12/31/21 1537     Clinical Impression Statement Rt knee active mobility restriction in both extension and flexion c flexion most painful and noted in various positoning(supine, seated).  Reciprocal leg movement helped reduced symptoms and improve movement for Rt knee flexion.    Personal Factors and Comorbidities Comorbidity 3+    Comorbidities rt knee arthroscopy with lateral menisectomy, 10/05/2021  Hearing aid, reflux, arthritis, bowl obstruction, h/o baker's cyst, hyperlipidemia    Examination-Activity Limitations Other;Squat;Stairs;Stand;Sleep    Examination-Participation Restrictions Community Activity;Other;Occupation;Yard Work    Stability/Clinical Decision Making Stable/Uncomplicated    Rehab Potential Good    PT Frequency 2x / week    PT Duration 6 weeks    PT Treatment/Interventions ADLs/Self Care Home Management;Cryotherapy;Gait training;Stair training;Functional mobility training;Therapeutic activities;Balance training;Therapeutic exercise;Patient/family education;Manual techniques;Dry needling;Taping;Vasopneumatic Device;Passive range of motion;Joint Manipulations    PT Next Visit Plan Manual for flexion gains, quad strengthening (weight added to LAQ) static balance next visit per symptoms    PT Home Exercise Plan Access Code: [email or web address]  URL: [email or web address]    Consulted and Agree with Plan of Care Patient             Patient will benefit from skilled therapeutic intervention in order to improve the following deficits and impairments:  Pain, Increased edema, Decreased strength, Impaired flexibility, Decreased activity tolerance, Difficulty walking, Decreased range of motion  Visit Diagnosis: Chronic pain of right knee  Muscle weakness (generalized)  Difficulty in walking,  not elsewhere classified  Stiffness of right knee, not elsewhere classified  Localized edema     Problem List Patient Active Problem List   Diagnosis Date Noted   Patellar instability of right knee    Complex tear of lateral meniscus of right knee    Loose body in knee, right knee    Constipation 06/05/2021   Diverticular disease of colon 06/05/2021   Family history of other diseases of the digestive system 06/05/2021   Nausea 06/05/2021   Otalgia, left 06/05/2021   Right upper quadrant pain 06/05/2021   Acute lateral meniscus tear of right knee 06/05/2021   Patellar dislocation, right, sequela 06/05/2021   Diplacusis of left ear 10/02/2020   History of right mastoidectomy 04/10/2020   Postop check 02/29/2020   Bowel obstruction ([place]) 09/28/2019   Idiopathic thrombocytopenia ([place]) 09/28/2019   Eustachian tube dysfunction, bilateral 09/28/2019   Tinnitus aurium, bilateral 09/28/2019   Tympanic membrane central perforation, 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   Unspecified cholesteatoma, left ear 09/28/2019   IBS (irritable bowel syndrome) 07/09/2019   Headache 07/09/2019   GERD (gastroesophageal reflux disease) 07/09/2019   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

## 2022-01-01 ENCOUNTER — Ambulatory Visit (INDEPENDENT_AMBULATORY_CARE_PROVIDER_SITE_OTHER): Payer: 59 | Admitting: Orthopedic Surgery

## 2022-01-01 DIAGNOSIS — R200 Anesthesia of skin: Secondary | ICD-10-CM

## 2022-01-04 ENCOUNTER — Encounter: Payer: Self-pay | Admitting: Orthopedic Surgery

## 2022-01-04 NOTE — Progress Notes (Signed)
Post-Op Visit Note   Patient: Christine Brady           Date of Birth: May 29, 1961           MRN: [id number] Visit Date: 01/01/2022 PCP: Ronita Hipps, MD   Assessment & Plan:  Chief Complaint:  Chief Complaint  Patient presents with   Right Knee - Follow-up     right knee arthroscopy with lateral meniscectomy, MPFL reconstruction on 10/05/2021   Visit Diagnoses:  1. Numbness of foot     Plan: Christine Brady is a 61 year old patient who underwent right knee arthroscopy with partial lateral meniscectomy and MPFL reconstruction.  She is doing about the same.  Reports a lot of pain and swelling.  Having use ice.  She is developing a rash more on the right leg than the left leg but it is on both legs.  She has a history of ITP.  She has started physical therapy here which has helped.  She also reports significant bilateral foot numbness.  Hard for her to feel her feet.  Sees a neurologist February 22.  No recent vaccines.  She did have a shingles injection however around the time of her surgery.  Prednisone did help the rash but less so than numbness.  Antibiotics helped the rash but now that rash has recurred.  On examination she has flexion to about 90.  Patella is very stable.  Has diminished medial to lateral motion compared to the left-hand side.  No effusion or warmth around any of the incisions.  No effusion in the knee.  Plan at this time is nerve conduction to evaluate right foot numbness.  MRI scan pending.  2-week return.  Christine Brady is talking about getting the allograft removed but have never seen a problem with the allograft on 1 side causing numbness in the contralateral foot.  The rashes going on both legs but more so on the right.  We will have to evaluate as this evolves.  Follow-Up Instructions: No follow-ups on file.   Orders:  Orders Placed This Encounter  Procedures   Ambulatory referral to Neurology   No orders of the defined types were placed in this  encounter.   Imaging: No results found.  PMFS History: Patient Active Problem List   Diagnosis Date Noted   Patellar instability of right knee    Complex tear of lateral meniscus of right knee    Loose body in knee, right knee    Constipation 06/05/2021   Diverticular disease of colon 06/05/2021   Family history of other diseases of the digestive system 06/05/2021   Nausea 06/05/2021   Otalgia, left 06/05/2021   Right upper quadrant pain 06/05/2021   Acute lateral meniscus tear of right knee 06/05/2021   Patellar dislocation, right, sequela 06/05/2021   Diplacusis of left ear 10/02/2020   History of right mastoidectomy 04/10/2020   Postop check 02/29/2020   Bowel obstruction ([place]) 09/28/2019   Idiopathic thrombocytopenia (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   Menopausal syndrome 03/13/2018   History of postoperative nausea 01/19/2017   Atypical chest pain 11/11/2016   Hyperlipidemia 11/11/2016   Chronic ITP (idiopathic  thrombocytopenia) ([place]) 11/11/2016   Epidermoid cyst on back 10/28/2011   Past Medical History:  Diagnosis Date   Abdominal pain    Arthritis    difficulty walking   Atypical chest pain 11/11/2016   Bowel obstruction ([facility])    Cholesteatoma of right ear    Chronic headaches    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[place]) 11/11/2016   Cyst    back   Hearing aid worn    History of ITP    Hyperlipidemia 11/11/2016   Large ovary 07/09/2019   PONV (postoperative nausea and vomiting)    Reflux     Family History  Problem Relation Age of Onset    Diabetes Mother    Asthma Mother    Diabetes type II Mother    Alcohol abuse Mother    Hypertension Mother    Diabetes Father    Hypertension Father    Kidney disease Father    Diabetes type II Father    Alcohol abuse Father    Crohn's disease Sister    Cerebral palsy Brother    COPD Sister    Thrombocytopenia Sister     Past Surgical History:  Procedure Laterality Date   CHOLECYSTECTOMY  2010   ESOPHAGOGASTRODUODENOSCOPY (EGD) WITH PROPOFOL N/A 06/12/2019   Procedure: ESOPHAGOGASTRODUODENOSCOPY (EGD) WITH PROPOFOL;  Surgeon: Carol Ada, MD;  Location: [facility];  Service: Endoscopy;  Laterality: N/A;   EXTERNAL EAR SURGERY  2009/2010   EXTERNAL EAR SURGERY  2010   right ear   KNEE ARTHROSCOPY     right   KNEE ARTHROSCOPY WITH MEDIAL PATELLAR FEMORAL LIGAMENT RECONSTRUCTION Right 10/05/2021   Procedure: right knee arthroscoy, debridement, lateral menisectomy, medial patellar femoral ligament reconstruction;  Surgeon: Meredith Pel, MD;  Location: [place];  Service: Orthopedics;  Laterality: Right;   KNEE LIGAMENT RECONSTRUCTION     left   MASTOIDECTOMY Right 01/19/2017   Procedure: RIGHT MODIFIED RADICAL MASTOIDECTOMY;  Surgeon: Vicie Mutters, MD;  Location: [place];  Service: ENT;  Laterality: Right;   UPPER ESOPHAGEAL ENDOSCOPIC ULTRASOUND (EUS) N/A 06/12/2019   Procedure: UPPER ESOPHAGEAL ENDOSCOPIC ULTRASOUND (EUS);  Surgeon: Carol Ada, MD;  Location: [facility];  Service: Endoscopy;  Laterality: N/A;   WISDOM TOOTH EXTRACTION     Social History   Occupational History   Not on file  Tobacco Use   Smoking status: Never   Smokeless tobacco: Never  Vaping Use   Vaping Use: Never used  Substance and Sexual Activity   Alcohol use: Yes    Comment: occas   Drug use: No   Sexual activity: Not on file

## 2022-01-05 ENCOUNTER — Ambulatory Visit: Payer: 59 | Admitting: Rehabilitative and Restorative Service Providers"

## 2022-01-05 ENCOUNTER — Encounter: Payer: Self-pay | Admitting: Rehabilitative and Restorative Service Providers"

## 2022-01-05 ENCOUNTER — Other Ambulatory Visit: Payer: Self-pay

## 2022-01-05 DIAGNOSIS — G8929 Other chronic pain: Secondary | ICD-10-CM | POA: Diagnosis not present

## 2022-01-05 DIAGNOSIS — M25561 Pain in right knee: Secondary | ICD-10-CM

## 2022-01-05 DIAGNOSIS — M25661 Stiffness of right knee, not elsewhere classified: Secondary | ICD-10-CM

## 2022-01-05 DIAGNOSIS — M6281 Muscle weakness (generalized): Secondary | ICD-10-CM

## 2022-01-05 DIAGNOSIS — R262 Difficulty in walking, not elsewhere classified: Secondary | ICD-10-CM | POA: Diagnosis not present

## 2022-01-05 DIAGNOSIS — R600 Localized edema: Secondary | ICD-10-CM | POA: Diagnosis not present

## 2022-01-05 NOTE — Therapy (Signed)
[place], [place], [address] Phone: [phone]   Fax:  [phone]  Physical Therapy Treatment  Patient Details  Name: Christine Brady MRN: [id number] Date of Birth: February 25, 1961 Referring Provider (PT): Marcene Duos   Encounter Date: 01/05/2022   PT End of Session - 01/05/22 1100     Visit Number 3    Number of Visits 13    Date for PT Re-Evaluation 02/12/22    Authorization Type [employer]    Progress Note Due on Visit 10    PT Start Time 1100    PT Stop Time 1151    PT Time Calculation (min) 51 min    Activity Tolerance Patient tolerated treatment well    Behavior During Therapy [facility] for tasks assessed/performed             Past Medical History:  Diagnosis Date   Abdominal pain    Arthritis    difficulty walking   Atypical chest pain 11/11/2016   Bowel obstruction ([place])    Cholesteatoma of right ear    Chronic headaches    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[place]) 11/11/2016   Cyst    back   Hearing aid worn    History of ITP    Hyperlipidemia 11/11/2016   Large ovary 07/09/2019   PONV (postoperative nausea and vomiting)    Reflux     Past Surgical History:  Procedure Laterality Date   CHOLECYSTECTOMY  2010   ESOPHAGOGASTRODUODENOSCOPY (EGD) WITH PROPOFOL N/A 06/12/2019   Procedure: ESOPHAGOGASTRODUODENOSCOPY (EGD) WITH PROPOFOL;  Surgeon: Carol Ada, MD;  Location: [facility];  Service: Endoscopy;  Laterality: N/A;   EXTERNAL EAR SURGERY  2009/2010   EXTERNAL EAR SURGERY  2010   right ear   KNEE ARTHROSCOPY     right   KNEE ARTHROSCOPY WITH MEDIAL PATELLAR FEMORAL LIGAMENT RECONSTRUCTION Right 10/05/2021   Procedure: right knee arthroscoy, debridement, lateral menisectomy, medial patellar femoral ligament reconstruction;  Surgeon: Meredith Pel, MD;  Location: [place];  Service: Orthopedics;  Laterality: Right;   KNEE LIGAMENT RECONSTRUCTION     left    MASTOIDECTOMY Right 01/19/2017   Procedure: RIGHT MODIFIED RADICAL MASTOIDECTOMY;  Surgeon: Vicie Mutters, MD;  Location: [place];  Service: ENT;  Laterality: Right;   UPPER ESOPHAGEAL ENDOSCOPIC ULTRASOUND (EUS) N/A 06/12/2019   Procedure: UPPER ESOPHAGEAL ENDOSCOPIC ULTRASOUND (EUS);  Surgeon: Carol Ada, MD;  Location: [facility];  Service: Endoscopy;  Laterality: N/A;   WISDOM TOOTH EXTRACTION      There were no vitals filed for this visit.   Subjective Assessment - 01/05/22 1104     Subjective Pt. indicated pain upon arrival at 2/10 but has 10/10 at nighttime.  Pt. indicated pain medicine doesn't seem to help at night much.    Pertinent History : rt knee arthroscopy with lateral menisectomy, 10/05/2021  Hearing aid, reflux, arthritis, bowl obstruction, h/o baker's cyst, hyperlipidemia    Limitations Other (comment);Writing;House hold activities;Lifting;Standing    Patient Stated Goals Get back to working in my yard and stop hurting    Currently in Pain? Yes    Pain Score 2     Pain Location Knee    Pain Orientation Right;Anterior;Medial    Pain Descriptors / Indicators Aching;Tightness;Sore;Shooting    Pain Type Surgical pain    Pain Onset More than a month ago    Pain Frequency Constant    Aggravating Factors  nighttime pains severe    Pain Relieving Factors  icing                [facility] PT Assessment - 01/05/22 0001       Assessment   Medical Diagnosis M25.561 right knee pain    Referring Provider (PT) Marcene Duos    Onset Date/Surgical Date 10/05/21    Hand Dominance Right      Precautions   Precautions None      AROM   Right Knee Flexion 98   in supine heel slide c pain limiting                          [facility] Adult PT Treatment/Exercise - 01/05/22 0001       Neuro Re-ed    Neuro Re-ed Details  tandem stance on foam 60 sec x 2 bilateral      Knee/Hip Exercises: Stretches   Gastroc Stretch 30 seconds;5 reps;Both    incline board     Knee/Hip Exercises: Aerobic   Recumbent Bike Lvl 2 7 mins seat 5      Knee/Hip Exercises: Machines for Strengthening   Cybex Leg Press Double leg 50 lbs 2 x10, single leg 25 lbs 2 x 10 performed bilateral      Knee/Hip Exercises: Seated   Long Arc Quad Right;3 sets;15 reps    Long Arc Quad Weight 2 lbs.   actually 2.5 lbs   Other Seated Knee/Hip Exercises seated isometric alt 5 sec holds extension and flexion Rt knee 2 mins total    Other Seated Knee/Hip Exercises seated SLR 3 x 10 Rt      Vasopneumatic   Number Minutes Vasopneumatic  10 minutes    Vasopnuematic Location  Knee    Vasopneumatic Pressure Medium    Vasopneumatic Temperature  34      Manual Therapy   Manual therapy comments seated Rt knee flexion mobilization c movement c IR/distraction and contralateral leg movement opposite.                       PT Short Term Goals - 01/05/22 1141       PT SHORT TERM GOAL #1   Title Pt will be independent in her initial HEP    Time 3    Period Weeks    Status On-going    Target Date 01/15/22      PT SHORT TERM GOAL #2   Title Pt will improve her 5 time sit to stand to </= 12 seconds with no UE support.    Time 3    Period Weeks    Status On-going    Target Date 01/15/22               PT Long Term Goals - 12/28/21 1656       PT LONG TERM GOAL #1   Title Pt will be independent in her advanced HEP.    Time 6    Period Weeks    Status New    Target Date 02/12/22      PT LONG TERM GOAL #2   Title Pt will improve her FOTO score from 37% tp 60%    Time 6    Period Weeks    Status New    Target Date 02/12/22      PT LONG TERM GOAL #3   Title Pt will improve her right knee flexion to >/= 125 degrees in order to improve functional mobility.    Time  6    Period Weeks    Status New    Target Date 02/12/22      PT LONG TERM GOAL #4   Title Pt will be able to improve her right knee extension to 5/5.    Time 6    Period Weeks     Status New    Target Date 02/12/22      PT LONG TERM GOAL #5   Title Pt will be able to work full shift with pain </= 2/10 in her right knee.    Time 6    Period Weeks    Status New    Target Date 02/12/22                   Plan - 01/05/22 1137     Clinical Impression Statement Pt. to benefit from continued skilled PT services c focus on improved tolerance to mobility (minimal structural restriction noted today in clinic, limited more by symptoms) as well as progressive strengthening.    Personal Factors and Comorbidities Comorbidity 3+    Comorbidities rt knee arthroscopy with lateral menisectomy, 10/05/2021  Hearing aid, reflux, arthritis, bowl obstruction, h/o baker's cyst, hyperlipidemia    Examination-Activity Limitations Other;Squat;Stairs;Stand;Sleep    Examination-Participation Restrictions Community Activity;Other;Occupation;Yard Work    Stability/Clinical Decision Making Stable/Uncomplicated    Rehab Potential Good    PT Frequency 2x / week    PT Duration 6 weeks    PT Treatment/Interventions ADLs/Self Care Home Management;Cryotherapy;Gait training;Stair training;Functional mobility training;Therapeutic activities;Balance training;Therapeutic exercise;Patient/family education;Manual techniques;Dry needling;Taping;Vasopneumatic Device;Passive range of motion;Joint Manipulations;Neuromuscular re-education    PT Next Visit Plan Continue quad strengthening and functional WB improvements.    PT Home Exercise Plan Access Code: [email or web address]  URL: [email or web address]    Consulted and Agree with Plan of Care Patient             Patient will benefit from skilled therapeutic intervention in order to improve the following deficits and impairments:  Pain, Increased edema, Decreased strength, Impaired flexibility, Decreased activity tolerance, Difficulty walking, Decreased range of motion  Visit Diagnosis: Chronic pain of right knee  Muscle weakness  (generalized)  Difficulty in walking, not elsewhere classified  Stiffness of right knee, not elsewhere classified  Localized edema     Problem List Patient Active Problem List   Diagnosis Date Noted   Patellar instability of right knee    Complex tear of lateral meniscus of right knee    Loose body in knee, right knee    Constipation 06/05/2021   Diverticular disease of colon 06/05/2021   Family history of other diseases of the digestive system 06/05/2021   Nausea 06/05/2021   Otalgia, left 06/05/2021   Right upper quadrant pain 06/05/2021   Acute lateral meniscus tear of right knee 06/05/2021   Patellar dislocation, right, sequela 06/05/2021   Diplacusis of left ear 10/02/2020   History of right mastoidectomy 04/10/2020   Postop check 02/29/2020   Bowel obstruction ([place]) 09/28/2019   Idiopathic thrombocytopenia ([place]) 09/28/2019   Eustachian tube dysfunction, bilateral 09/28/2019   Tinnitus aurium, bilateral 09/28/2019   Tympanic membrane central perforation, 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   Unspecified cholesteatoma, left ear 09/28/2019   IBS (irritable bowel syndrome) 07/09/2019   Headache 07/09/2019   GERD (gastroesophageal reflux disease) 07/09/2019   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 [place], [place], [address] Phone: [phone]   Fax:  [phone]  Name: DYMPHNA WADLEY MRN: [id number] Date of Birth: January 21, 1961

## 2022-01-06 ENCOUNTER — Other Ambulatory Visit (HOSPITAL_COMMUNITY): Payer: Self-pay

## 2022-01-06 DIAGNOSIS — R319 Hematuria, unspecified: Secondary | ICD-10-CM | POA: Diagnosis not present

## 2022-01-06 DIAGNOSIS — R399 Unspecified symptoms and signs involving the genitourinary system: Secondary | ICD-10-CM | POA: Diagnosis not present

## 2022-01-06 MED ORDER — NITROFURANTOIN MONOHYD MACRO 100 MG PO CAPS
100.0000 mg | ORAL_CAPSULE | Freq: Two times a day (BID) | ORAL | 0 refills | Status: DC
  Filled 2022-01-06: qty 10, 5d supply, fill #0

## 2022-01-06 MED ORDER — FLUCONAZOLE 150 MG PO TABS
ORAL_TABLET | ORAL | 1 refills | Status: DC
  Filled 2022-01-06: qty 2, 3d supply, fill #0

## 2022-01-07 ENCOUNTER — Ambulatory Visit: Payer: 59 | Admitting: Rehabilitative and Restorative Service Providers"

## 2022-01-07 ENCOUNTER — Other Ambulatory Visit: Payer: Self-pay

## 2022-01-07 ENCOUNTER — Encounter: Payer: Self-pay | Admitting: Rehabilitative and Restorative Service Providers"

## 2022-01-07 ENCOUNTER — Other Ambulatory Visit (HOSPITAL_COMMUNITY): Payer: Self-pay

## 2022-01-07 DIAGNOSIS — R600 Localized edema: Secondary | ICD-10-CM

## 2022-01-07 DIAGNOSIS — M25661 Stiffness of right knee, not elsewhere classified: Secondary | ICD-10-CM | POA: Diagnosis not present

## 2022-01-07 DIAGNOSIS — M6281 Muscle weakness (generalized): Secondary | ICD-10-CM

## 2022-01-07 DIAGNOSIS — R262 Difficulty in walking, not elsewhere classified: Secondary | ICD-10-CM

## 2022-01-07 DIAGNOSIS — M25561 Pain in right knee: Secondary | ICD-10-CM

## 2022-01-07 DIAGNOSIS — G8929 Other chronic pain: Secondary | ICD-10-CM | POA: Diagnosis not present

## 2022-01-07 NOTE — Therapy (Signed)
[facility] [address] [place], [place], [address] Phone: [phone]   Fax:  [phone]  Physical Therapy Treatment  Patient Details  Name: Christine Brady MRN: [id number] Date of Birth: 02/17/1961 Referring Provider (PT): Marcene Duos   Encounter Date: 01/07/2022   PT End of Session - 01/07/22 0840     Visit Number 4    Number of Visits 13    Date for PT Re-Evaluation 02/12/22    Authorization Type [employer] [employer]    Progress Note Due on Visit 10    PT Start Time [phone]    PT Stop Time 0932    PT Time Calculation (min) 50 min    Activity Tolerance Patient tolerated treatment well    Behavior During Therapy [facility] for tasks assessed/performed             Past Medical History:  Diagnosis Date   Abdominal pain    Arthritis    difficulty walking   Atypical chest pain 11/11/2016   Bowel obstruction ([place])    Cholesteatoma of right ear    Chronic headaches    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[place]) 11/11/2016   Cyst    back   Hearing aid worn    History of ITP    Hyperlipidemia 11/11/2016   Large ovary 07/09/2019   PONV (postoperative nausea and vomiting)    Reflux     Past Surgical History:  Procedure Laterality Date   CHOLECYSTECTOMY  2010   ESOPHAGOGASTRODUODENOSCOPY (EGD) WITH PROPOFOL N/A 06/12/2019   Procedure: ESOPHAGOGASTRODUODENOSCOPY (EGD) WITH PROPOFOL;  Surgeon: Carol Ada, MD;  Location: [facility];  Service: Endoscopy;  Laterality: N/A;   EXTERNAL EAR SURGERY  2009/2010   EXTERNAL EAR SURGERY  2010   right ear   KNEE ARTHROSCOPY     right   KNEE ARTHROSCOPY WITH MEDIAL PATELLAR FEMORAL LIGAMENT RECONSTRUCTION Right 10/05/2021   Procedure: right knee arthroscoy, debridement, lateral menisectomy, medial patellar femoral ligament reconstruction;  Surgeon: Meredith Pel, MD;  Location: [place];  Service: Orthopedics;  Laterality: Right;   KNEE LIGAMENT RECONSTRUCTION     left    MASTOIDECTOMY Right 01/19/2017   Procedure: RIGHT MODIFIED RADICAL MASTOIDECTOMY;  Surgeon: Vicie Mutters, MD;  Location: [place];  Service: ENT;  Laterality: Right;   UPPER ESOPHAGEAL ENDOSCOPIC ULTRASOUND (EUS) N/A 06/12/2019   Procedure: UPPER ESOPHAGEAL ENDOSCOPIC ULTRASOUND (EUS);  Surgeon: Carol Ada, MD;  Location: [facility];  Service: Endoscopy;  Laterality: N/A;   WISDOM TOOTH EXTRACTION      There were no vitals filed for this visit.   Subjective Assessment - 01/07/22 0845     Subjective Pt. reported feeling 2/10 upon arrival today.  Pt. indicated last night started out ok but ended up being a lot of pain resulting in taking medicine.  Pt. indicated feeling movement is getting better but strength is still lacking.    Pertinent History : rt knee arthroscopy with lateral menisectomy, 10/05/2021  Hearing aid, reflux, arthritis, bowl obstruction, h/o baker's cyst, hyperlipidemia    Limitations Other (comment);Writing;House hold activities;Lifting;Standing    Patient Stated Goals Get back to working in my yard and stop hurting    Currently in Pain? Yes    Pain Score 2     Pain Location Knee    Pain Orientation Right;Anterior;Medial    Pain Descriptors / Indicators Aching;Tightness;Shooting;Sore    Pain Type Surgical pain    Pain Onset More than a month ago    Pain Frequency  Intermittent    Aggravating Factors  nighttime still worse, prolonged walking    Pain Relieving Factors icing, medicine                [facility] PT Assessment - 01/07/22 0001       Assessment   Medical Diagnosis M25.561 right knee pain    Referring Provider (PT) Marcene Duos    Onset Date/Surgical Date 10/05/21    Hand Dominance Right      Strength   Right Knee Flexion 5/5    Right Knee Extension 4/5   29, 27 lbs   Left Knee Extension 5/5   37.4, 37.4 lbs                          [facility] Adult PT Treatment/Exercise - 01/07/22 0001       Neuro Re-ed    Neuro  Re-ed Details  single leg stance c vector reaching light touch contralateral leg (fwd/lateral/reverse) x 10 each      Knee/Hip Exercises: Aerobic   Recumbent Bike Lvl 2 6 mins seat 5      Knee/Hip Exercises: Machines for Strengthening   Cybex Leg Press Double leg 62 lbs 2 x 15, single leg 31 lbs 2 x 10 bilateral      Knee/Hip Exercises: Standing   Forward Step Up Step Height: 4";2 sets;10 reps;Both   focus on full TKE in stance on step     Knee/Hip Exercises: Seated   Other Seated Knee/Hip Exercises seated isometric alternating extension/flexion Rt knee 10 sec hold x 6 each in mid range    Other Seated Knee/Hip Exercises seated SLR 2 x 10 bilateral      Vasopneumatic   Number Minutes Vasopneumatic  10 minutes    Vasopnuematic Location  Knee    Vasopneumatic Pressure Medium    Vasopneumatic Temperature  34      Manual Therapy   Manual therapy comments seated Rt knee flexion mobilization c movement c IR/distraction and contralateral leg movement opposite.                       PT Short Term Goals - 01/05/22 1141       PT SHORT TERM GOAL #1   Title Pt will be independent in her initial HEP    Time 3    Period Weeks    Status On-going    Target Date 01/15/22      PT SHORT TERM GOAL #2   Title Pt will improve her 5 time sit to stand to </= 12 seconds with no UE support.    Time 3    Period Weeks    Status On-going    Target Date 01/15/22               PT Long Term Goals - 12/28/21 1656       PT LONG TERM GOAL #1   Title Pt will be independent in her advanced HEP.    Time 6    Period Weeks    Status New    Target Date 02/12/22      PT LONG TERM GOAL #2   Title Pt will improve her FOTO score from 37% tp 60%    Time 6    Period Weeks    Status New    Target Date 02/12/22      PT LONG TERM GOAL #3   Title Pt will improve her right  knee flexion to >/= 125 degrees in order to improve functional mobility.    Time 6    Period Weeks    Status New     Target Date 02/12/22      PT LONG TERM GOAL #4   Title Pt will be able to improve her right knee extension to 5/5.    Time 6    Period Weeks    Status New    Target Date 02/12/22      PT LONG TERM GOAL #5   Title Pt will be able to work full shift with pain </= 2/10 in her right knee.    Time 6    Period Weeks    Status New    Target Date 02/12/22                   Plan - 01/07/22 0906     Clinical Impression Statement Pt. demonstrated improvement in resisted static testing strength compared to evaluation.  Provided measurement today of dynamometry to allow further analysis in future.  Pt. continued to present c Rt knee flexion mobility deficits c symptoms, strength and balance control deficits that impair functional movement of daily life. Continued skilled PT services indicated.    Personal Factors and Comorbidities Comorbidity 3+    Comorbidities rt knee arthroscopy with lateral menisectomy, 10/05/2021  Hearing aid, reflux, arthritis, bowl obstruction, h/o baker's cyst, hyperlipidemia    Examination-Activity Limitations Other;Squat;Stairs;Stand;Sleep    Examination-Participation Restrictions Community Activity;Other;Occupation;Yard Work    Stability/Clinical Decision Making Stable/Uncomplicated    Rehab Potential Good    PT Frequency 2x / week    PT Duration 6 weeks    PT Treatment/Interventions ADLs/Self Care Home Management;Cryotherapy;Gait training;Stair training;Functional mobility training;Therapeutic activities;Balance training;Therapeutic exercise;Patient/family education;Manual techniques;Dry needling;Taping;Vasopneumatic Device;Passive range of motion;Joint Manipulations;Neuromuscular re-education    PT Next Visit Plan Continue quad strengthening and functional WB improvements with dynamic balance control on level surfaces.    PT Home Exercise Plan Access Code: [email or web address]  URL: [email or web address]    Consulted and Agree with Plan of Care Patient              Patient will benefit from skilled therapeutic intervention in order to improve the following deficits and impairments:  Pain, Increased edema, Decreased strength, Impaired flexibility, Decreased activity tolerance, Difficulty walking, Decreased range of motion  Visit Diagnosis: Chronic pain of right knee  Muscle weakness (generalized)  Difficulty in walking, not elsewhere classified  Stiffness of right knee, not elsewhere classified  Localized edema     Problem List Patient Active Problem List   Diagnosis Date Noted   Patellar instability of right knee    Complex tear of lateral meniscus of right knee    Loose body in knee, right knee    Constipation 06/05/2021   Diverticular disease of colon 06/05/2021   Family history of other diseases of the digestive system 06/05/2021   Nausea 06/05/2021   Otalgia, left 06/05/2021   Right upper quadrant pain 06/05/2021   Acute lateral meniscus tear of right knee 06/05/2021   Patellar dislocation, right, sequela 06/05/2021   Diplacusis of left ear 10/02/2020   History of right mastoidectomy 04/10/2020   Postop check 02/29/2020   Bowel obstruction ([place]) 09/28/2019   Idiopathic thrombocytopenia ([place]) 09/28/2019   Eustachian tube dysfunction, bilateral 09/28/2019   Tinnitus aurium, bilateral 09/28/2019   Tympanic membrane central perforation, 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   Unspecified cholesteatoma, left ear 09/28/2019   IBS (irritable bowel syndrome) 07/09/2019   Headache 07/09/2019  GERD (gastroesophageal reflux disease) 07/09/2019   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

## 2022-01-08 ENCOUNTER — Telehealth: Payer: Self-pay | Admitting: Physical Medicine and Rehabilitation

## 2022-01-08 DIAGNOSIS — E538 Deficiency of other specified B group vitamins: Secondary | ICD-10-CM | POA: Diagnosis not present

## 2022-01-08 NOTE — Telephone Encounter (Signed)
Patient called. Says Dr. Marlou Sa told her that she would be getting a nerve study done and has not heard from Dr. Romona Curls office. Her call back number is [phone]

## 2022-01-11 ENCOUNTER — Encounter: Payer: Self-pay | Admitting: Rehabilitative and Restorative Service Providers"

## 2022-01-11 ENCOUNTER — Ambulatory Visit (HOSPITAL_COMMUNITY)
Admission: RE | Admit: 2022-01-11 | Discharge: 2022-01-11 | Disposition: A | Discharge status: Home / Self Care | Payer: 59 | Source: Ambulatory Visit | Attending: Orthopedic Surgery | Admitting: Orthopedic Surgery

## 2022-01-11 ENCOUNTER — Ambulatory Visit: Payer: 59 | Admitting: Rehabilitative and Restorative Service Providers"

## 2022-01-11 ENCOUNTER — Encounter: Payer: Self-pay | Admitting: Orthopedic Surgery

## 2022-01-11 ENCOUNTER — Other Ambulatory Visit: Payer: Self-pay

## 2022-01-11 DIAGNOSIS — M545 Low back pain, unspecified: Secondary | ICD-10-CM

## 2022-01-11 DIAGNOSIS — M6281 Muscle weakness (generalized): Secondary | ICD-10-CM

## 2022-01-11 DIAGNOSIS — G8929 Other chronic pain: Secondary | ICD-10-CM | POA: Diagnosis not present

## 2022-01-11 DIAGNOSIS — R600 Localized edema: Secondary | ICD-10-CM | POA: Diagnosis not present

## 2022-01-11 DIAGNOSIS — M25561 Pain in right knee: Secondary | ICD-10-CM | POA: Diagnosis not present

## 2022-01-11 DIAGNOSIS — R262 Difficulty in walking, not elsewhere classified: Secondary | ICD-10-CM

## 2022-01-11 DIAGNOSIS — M25661 Stiffness of right knee, not elsewhere classified: Secondary | ICD-10-CM | POA: Diagnosis not present

## 2022-01-11 NOTE — Telephone Encounter (Signed)
[place] for [employer] thx very unclear clinical pic

## 2022-01-11 NOTE — Therapy (Signed)
[place], [place], [address] Phone: [phone]   Fax:  [phone]  Physical Therapy Treatment  Patient Details  Name: Christine Brady MRN: [id number] Date of Birth: [birth year] Referring Provider (PT): Marcene Duos   Encounter Date: 01/11/2022   PT End of Session - 01/11/22 1527     Visit Number 5    Number of Visits 13    Date for PT Re-Evaluation 02/12/22    Authorization Type [employer] [employer]    Progress Note Due on Visit 10    PT Start Time 0240    PT Stop Time 1601    PT Time Calculation (min) 50 min    Activity Tolerance Patient tolerated treatment well    Behavior During Therapy [facility] for tasks assessed/performed             Past Medical History:  Diagnosis Date   Abdominal pain    Arthritis    difficulty walking   Atypical chest pain 11/11/2016   Bowel obstruction ([place])    Cholesteatoma of right ear    Chronic headaches    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[place]) 11/11/2016   Cyst    back   Hearing aid worn    History of ITP    Hyperlipidemia 11/11/2016   Large ovary 07/09/2019   PONV (postoperative nausea and vomiting)    Reflux     Past Surgical History:  Procedure Laterality Date   CHOLECYSTECTOMY  2010   ESOPHAGOGASTRODUODENOSCOPY (EGD) WITH PROPOFOL N/A 06/12/2019   Procedure: ESOPHAGOGASTRODUODENOSCOPY (EGD) WITH PROPOFOL;  Surgeon: Carol Ada, MD;  Location: [facility];  Service: Endoscopy;  Laterality: N/A;   EXTERNAL EAR SURGERY  2009/2010   EXTERNAL EAR SURGERY  2010   right ear   KNEE ARTHROSCOPY     right   KNEE ARTHROSCOPY WITH MEDIAL PATELLAR FEMORAL LIGAMENT RECONSTRUCTION Right 10/05/2021   Procedure: right knee arthroscoy, debridement, lateral menisectomy, medial patellar femoral ligament reconstruction;  Surgeon: Meredith Pel, MD;  Location: [place];  Service: Orthopedics;  Laterality: Right;   KNEE LIGAMENT RECONSTRUCTION     left    MASTOIDECTOMY Right 01/19/2017   Procedure: RIGHT MODIFIED RADICAL MASTOIDECTOMY;  Surgeon: Vicie Mutters, MD;  Location: [place];  Service: ENT;  Laterality: Right;   UPPER ESOPHAGEAL ENDOSCOPIC ULTRASOUND (EUS) N/A 06/12/2019   Procedure: UPPER ESOPHAGEAL ENDOSCOPIC ULTRASOUND (EUS);  Surgeon: Carol Ada, MD;  Location: [facility];  Service: Endoscopy;  Laterality: N/A;   WISDOM TOOTH EXTRACTION      There were no vitals filed for this visit.   Subjective Assessment - 01/11/22 1513     Subjective Pt. indicated doing fairly well during the day.  Pt. indicated feeling nighttime pain continued severely.    Pertinent History : rt knee arthroscopy with lateral menisectomy, 10/05/2021  Hearing aid, reflux, arthritis, bowl obstruction, h/o baker's cyst, hyperlipidemia    Limitations Other (comment);Writing;House hold activities;Lifting;Standing    Patient Stated Goals Get back to working in my yard and stop hurting    Currently in Pain? No/denies    Pain Score 10-Worst pain ever    Pain Orientation Right    Pain Descriptors / Indicators Shooting    Pain Type Surgical pain    Pain Onset More than a month ago    Pain Frequency Intermittent    Aggravating Factors  nighttime    Pain Relieving Factors nothing much last night  [facility] Adult PT Treatment/Exercise - 01/11/22 0001       Neuro Re-ed    Neuro Re-ed Details  tandem stance on foam 1 min x 2 bilateral, retro step loading on Rt leg 20x      Knee/Hip Exercises: Aerobic   Recumbent Bike Lvl 2 10 mins seat 5      Knee/Hip Exercises: Machines for Strengthening   Cybex Leg Press Double leg 62 lbs 2 x 15, single leg 31 lbs 2 x 15 bilateral      Knee/Hip Exercises: Seated   Long Arc Quad Right   3 x 15   Long Arc Quad Weight 3 lbs.      Modalities   Modalities Teacher, English as a foreign language Location Rt knee pre mod     Electrical Stimulation Action Pre mod    Electrical Stimulation Parameters to tolerance with vaso 10 mins    Electrical Stimulation Goals Pain      Vasopneumatic   Number Minutes Vasopneumatic  10 minutes    Vasopnuematic Location  Knee    Vasopneumatic Pressure Medium    Vasopneumatic Temperature  34                     PT Education - 01/11/22 1526     Education Details Estim rationale, TENS unit possible for home use (has one)    Person(s) Educated Patient    Methods Explanation;Demonstration;Verbal cues;Handout    Comprehension Verbalized understanding;Returned demonstration              PT Short Term Goals - 01/05/22 1141       PT SHORT TERM GOAL #1   Title Pt will be independent in her initial HEP    Time 3    Period Weeks    Status On-going    Target Date 01/15/22      PT SHORT TERM GOAL #2   Title Pt will improve her 5 time sit to stand to </= 12 seconds with no UE support.    Time 3    Period Weeks    Status On-going    Target Date 01/15/22               PT Long Term Goals - 12/28/21 1656       PT LONG TERM GOAL #1   Title Pt will be independent in her advanced HEP.    Time 6    Period Weeks    Status New    Target Date 02/12/22      PT LONG TERM GOAL #2   Title Pt will improve her FOTO score from 37% tp 60%    Time 6    Period Weeks    Status New    Target Date 02/12/22      PT LONG TERM GOAL #3   Title Pt will improve her right knee flexion to >/= 125 degrees in order to improve functional mobility.    Time 6    Period Weeks    Status New    Target Date 02/12/22      PT LONG TERM GOAL #4   Title Pt will be able to improve her right knee extension to 5/5.    Time 6    Period Weeks    Status New    Target Date 02/12/22      PT LONG TERM GOAL #5   Title Pt will be able to work  full shift with pain </= 2/10 in her right knee.    Time 6    Period Weeks    Status New    Target Date 02/12/22                    Plan - 01/11/22 1536     Clinical Impression Statement Pt. continued to report mainly pain at night since last visit.  Use of estim and cues for use of at home TENS unit for attempts for pain relief given today.  Pt. to benefit from continued mobility gains, overall strength and improved balance control to facilitate improved functional movement.    Personal Factors and Comorbidities Comorbidity 3+    Comorbidities rt knee arthroscopy with lateral menisectomy, 10/05/2021  Hearing aid, reflux, arthritis, bowl obstruction, h/o baker's cyst, hyperlipidemia    Examination-Activity Limitations Other;Squat;Stairs;Stand;Sleep    Examination-Participation Restrictions Community Activity;Other;Occupation;Yard Work    Stability/Clinical Decision Making Stable/Uncomplicated    Rehab Potential Good    PT Frequency 2x / week    PT Duration 6 weeks    PT Treatment/Interventions ADLs/Self Care Home Management;Cryotherapy;Gait training;Stair training;Functional mobility training;Therapeutic activities;Balance training;Therapeutic exercise;Patient/family education;Manual techniques;Dry needling;Taping;Vasopneumatic Device;Passive range of motion;Joint Manipulations;Neuromuscular re-education    PT Next Visit Plan Check on TENS unit beneift at home.  Progressive strengthening and static/dynamic balance control.    PT Home Exercise Plan Access Code: [email or web address]  URL: [email or web address]    Consulted and Agree with Plan of Care Patient             Patient will benefit from skilled therapeutic intervention in order to improve the following deficits and impairments:  Pain, Increased edema, Decreased strength, Impaired flexibility, Decreased activity tolerance, Difficulty walking, Decreased range of motion  Visit Diagnosis: Chronic pain of right knee  Muscle weakness (generalized)  Difficulty in walking, not elsewhere classified  Stiffness of right knee, not elsewhere  classified  Localized edema     Problem List Patient Active Problem List   Diagnosis Date Noted   Patellar instability of right knee    Complex tear of lateral meniscus of right knee    Loose body in knee, right knee    Constipation 06/05/2021   Diverticular disease of colon 06/05/2021   Family history of other diseases of the digestive system 06/05/2021   Nausea 06/05/2021   Otalgia, left 06/05/2021   Right upper quadrant pain 06/05/2021   Acute lateral meniscus tear of right knee 06/05/2021   Patellar dislocation, right, sequela 06/05/2021   Diplacusis of left ear 10/02/2020   History of right mastoidectomy 04/10/2020   Postop check 02/29/2020   Bowel obstruction ([place]) 09/28/2019   Idiopathic thrombocytopenia ([place]) 09/28/2019   Eustachian tube dysfunction, bilateral 09/28/2019   Tinnitus aurium, bilateral 09/28/2019   Tympanic membrane central perforation, 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   Unspecified cholesteatoma, left ear 09/28/2019   IBS (irritable bowel syndrome) 07/09/2019   Headache 07/09/2019   GERD (gastroesophageal reflux disease) 07/09/2019   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 [place], [place], [address] Phone: [phone]   Fax:  [phone]  Name: Christine Brady MRN:  [id number] Date of Birth: 1961-10-09

## 2022-01-13 ENCOUNTER — Encounter: Payer: Self-pay | Admitting: Rehabilitative and Restorative Service Providers"

## 2022-01-13 ENCOUNTER — Other Ambulatory Visit (HOSPITAL_COMMUNITY): Payer: Self-pay

## 2022-01-13 ENCOUNTER — Other Ambulatory Visit: Payer: Self-pay | Admitting: Orthopaedic Surgery

## 2022-01-13 ENCOUNTER — Other Ambulatory Visit: Payer: Self-pay

## 2022-01-13 ENCOUNTER — Other Ambulatory Visit: Payer: Self-pay | Admitting: Orthopedic Surgery

## 2022-01-13 ENCOUNTER — Ambulatory Visit: Payer: 59 | Admitting: Rehabilitative and Restorative Service Providers"

## 2022-01-13 DIAGNOSIS — M6281 Muscle weakness (generalized): Secondary | ICD-10-CM

## 2022-01-13 DIAGNOSIS — G8929 Other chronic pain: Secondary | ICD-10-CM

## 2022-01-13 DIAGNOSIS — R600 Localized edema: Secondary | ICD-10-CM

## 2022-01-13 DIAGNOSIS — R262 Difficulty in walking, not elsewhere classified: Secondary | ICD-10-CM

## 2022-01-13 DIAGNOSIS — M25661 Stiffness of right knee, not elsewhere classified: Secondary | ICD-10-CM

## 2022-01-13 DIAGNOSIS — M25561 Pain in right knee: Secondary | ICD-10-CM | POA: Diagnosis not present

## 2022-01-13 NOTE — Therapy (Signed)
[place], [place], [address] Phone: [phone]   Fax:  [phone]  Physical Therapy Treatment  Patient Details  Name: Christine Brady MRN: [id number] Date of Birth: [birth year] Referring Provider (PT): Marcene Duos   Encounter Date: 01/13/2022   PT End of Session - 01/13/22 1513     Visit Number 6    Number of Visits 13    Date for PT Re-Evaluation 02/12/22    Authorization Type [employer] [employer]    Progress Note Due on Visit 10    PT Start Time 1508    PT Stop Time 1605    PT Time Calculation (min) 57 min    Activity Tolerance Patient tolerated treatment well    Behavior During Therapy [facility] for tasks assessed/performed             Past Medical History:  Diagnosis Date   Abdominal pain    Arthritis    difficulty walking   Atypical chest pain 11/11/2016   Bowel obstruction ([place])    Cholesteatoma of right ear    Chronic headaches    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[place]) 11/11/2016   Cyst    back   Hearing aid worn    History of ITP    Hyperlipidemia 11/11/2016   Large ovary 07/09/2019   PONV (postoperative nausea and vomiting)    Reflux     Past Surgical History:  Procedure Laterality Date   CHOLECYSTECTOMY  2010   ESOPHAGOGASTRODUODENOSCOPY (EGD) WITH PROPOFOL N/A 06/12/2019   Procedure: ESOPHAGOGASTRODUODENOSCOPY (EGD) WITH PROPOFOL;  Surgeon: Carol Ada, MD;  [facility];  Service: Endoscopy;  Laterality: N/A;   EXTERNAL EAR SURGERY  2009/2010   EXTERNAL EAR SURGERY  2010   right ear   KNEE ARTHROSCOPY     right   KNEE ARTHROSCOPY WITH MEDIAL PATELLAR FEMORAL LIGAMENT RECONSTRUCTION Right 10/05/2021   Procedure: right knee arthroscoy, debridement, lateral menisectomy, medial patellar femoral ligament reconstruction;  Surgeon: Meredith Pel, MD;  Location: [facility];  Service: Orthopedics;  Laterality: Right;   KNEE LIGAMENT RECONSTRUCTION     left    MASTOIDECTOMY Right 01/19/2017   Procedure: RIGHT MODIFIED RADICAL MASTOIDECTOMY;  Surgeon: Vicie Mutters, MD;  Location: [place];  Service: ENT;  Laterality: Right;   UPPER ESOPHAGEAL ENDOSCOPIC ULTRASOUND (EUS) N/A 06/12/2019   Procedure: UPPER ESOPHAGEAL ENDOSCOPIC ULTRASOUND (EUS);  Surgeon: Carol Ada, MD;  Location[facility];  Service: Endoscopy;  Laterality: N/A;   WISDOM TOOTH EXTRACTION      There were no vitals filed for this visit.   Subjective Assessment - 01/13/22 1511     Subjective Pt. indicated sleeping is still troublesome.  Pt. stated TENS unit use at home seems to be helpful in the moment.  Pt. indicated having times of pain in joint in front and sometimes pain in back of knee as well.    Pertinent History : rt knee arthroscopy with lateral menisectomy, 10/05/2021  Hearing aid, reflux, arthritis, bowl obstruction, h/o baker's cyst, hyperlipidemia    Limitations Other (comment);Writing;House hold activities;Lifting;Standing    Patient Stated Goals Get back to working in my yard and stop hurting    Currently in Pain? Yes    Pain Score 4     Pain Location Knee    Pain Orientation Right;Anterior;Medial;Lateral    Pain Descriptors / Indicators Aching;Shooting    Pain Type Surgical pain    Pain Onset More than a month ago    Pain  Frequency Intermittent    Aggravating Factors  nighttime pain, various walking/standing, end range    Pain Relieving Factors TENS unit                [facility] PT Assessment - 01/13/22 0001       Assessment   Medical Diagnosis M25.561 right knee pain    Referring Provider (PT) Marcene Duos    Onset Date/Surgical Date 10/05/21    Hand Dominance Right      AROM   Right Knee Flexion 105   in supine heel slide                          [facility] Adult PT Treatment/Exercise - 01/13/22 0001       Neuro Re-ed    Neuro Re-ed Details  retro step Rt leg loading x 20, lateral stepping 3 cones x 10 bilateral c  SBA      Knee/Hip Exercises: Stretches   Gastroc Stretch 30 seconds;5 reps;Both   incline board     Knee/Hip Exercises: Aerobic   Recumbent Bike Seat 4 Lvl 2 6 mins      Knee/Hip Exercises: Machines for Strengthening   Cybex Leg Press Double leg 75 lbs 2 x 15, single leg 37 lbs 2 x 15 bilateral      Knee/Hip Exercises: Seated   Long Arc Quad Right   4 x 10   Long Arc Quad Weight 4 lbs.    Other Seated Knee/Hip Exercises seated isometric alternating extension/flexion Rt knee 10 sec hold x 6 each in mid range    Other Seated Knee/Hip Exercises seated SLR 2 x 15 bilateral      Vasopneumatic   Number Minutes Vasopneumatic  10 minutes    Vasopnuematic Location  Knee    Vasopneumatic Pressure Medium    Vasopneumatic Temperature  34                       PT Short Term Goals - 01/13/22 1513       PT SHORT TERM GOAL #1   Title Pt will be independent in her initial HEP    Time 3    Period Weeks    Status Achieved    Target Date 01/15/22      PT SHORT TERM GOAL #2   Title Pt will improve her 5 time sit to stand to </= 12 seconds with no UE support.    Time 3    Period Weeks    Status On-going    Target Date 01/15/22               PT Long Term Goals - 12/28/21 1656       PT LONG TERM GOAL #1   Title Pt will be independent in her advanced HEP.    Time 6    Period Weeks    Status New    Target Date 02/12/22      PT LONG TERM GOAL #2   Title Pt will improve her FOTO score from 37% tp 60%    Time 6    Period Weeks    Status New    Target Date 02/12/22      PT LONG TERM GOAL #3   Title Pt will improve her right knee flexion to >/= 125 degrees in order to improve functional mobility.    Time 6    Period Weeks    Status New  Target Date 02/12/22      PT LONG TERM GOAL #4   Title Pt will be able to improve her right knee extension to 5/5.    Time 6    Period Weeks    Status New    Target Date 02/12/22      PT LONG TERM GOAL #5   Title Pt  will be able to work full shift with pain </= 2/10 in her right knee.    Time 6    Period Weeks    Status New    Target Date 02/12/22                   Plan - 01/13/22 1556     Clinical Impression Statement Pt. demonstrated improvement in knee flexion compared to previous.  Pt. continued to show antalgic gait in stance c decreased stance and control in stance.  Progressive strengthening and balance control to improve Pt. presentation.  Nighttime pain still present but not reported while performing intervention.    Personal Factors and Comorbidities Comorbidity 3+    Comorbidities rt knee arthroscopy with lateral menisectomy, 10/05/2021  Hearing aid, reflux, arthritis, bowl obstruction, h/o baker's cyst, hyperlipidemia    Examination-Activity Limitations Other;Squat;Stairs;Stand;Sleep    Examination-Participation Restrictions Community Activity;Other;Occupation;Yard Work    Stability/Clinical Decision Making Stable/Uncomplicated    Rehab Potential Good    PT Frequency 2x / week    PT Duration 6 weeks    PT Treatment/Interventions ADLs/Self Care Home Management;Cryotherapy;Gait training;Stair training;Functional mobility training;Therapeutic activities;Balance training;Therapeutic exercise;Patient/family education;Manual techniques;Dry needling;Taping;Vasopneumatic Device;Passive range of motion;Joint Manipulations;Neuromuscular re-education    PT Next Visit Plan Progressive strengthening and static/dynamic balance control to improve ambulation.    PT Home Exercise Plan Access Code: [email or web address]  URL: [email or web address]    Consulted and Agree with Plan of Care Patient             Patient will benefit from skilled therapeutic intervention in order to improve the following deficits and impairments:  Pain, Increased edema, Decreased strength, Impaired flexibility, Decreased activity tolerance, Difficulty walking, Decreased range of motion  Visit Diagnosis: Chronic  pain of right knee  Muscle weakness (generalized)  Difficulty in walking, not elsewhere classified  Stiffness of right knee, not elsewhere classified  Localized edema     Problem List Patient Active Problem List   Diagnosis Date Noted   Patellar instability of right knee    Complex tear of lateral meniscus of right knee    Loose body in knee, right knee    Constipation 06/05/2021   Diverticular disease of colon 06/05/2021   Family history of other diseases of the digestive system 06/05/2021   Nausea 06/05/2021   Otalgia, left 06/05/2021   Right upper quadrant pain 06/05/2021   Acute lateral meniscus tear of right knee 06/05/2021   Patellar dislocation, right, sequela 06/05/2021   Diplacusis of left ear 10/02/2020   History of right mastoidectomy 04/10/2020   Postop check 02/29/2020   Bowel obstruction ([place]) 09/28/2019   Idiopathic thrombocytopenia ([place]) 09/28/2019   Eustachian tube dysfunction, bilateral 09/28/2019   Tinnitus aurium, bilateral 09/28/2019   Tympanic membrane central perforation, 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   Unspecified cholesteatoma, left ear 09/28/2019   IBS (irritable bowel syndrome) 07/09/2019   Headache 07/09/2019   GERD (gastroesophageal reflux disease) 07/09/2019   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

## 2022-01-15 ENCOUNTER — Ambulatory Visit (INDEPENDENT_AMBULATORY_CARE_PROVIDER_SITE_OTHER): Payer: 59 | Admitting: Orthopedic Surgery

## 2022-01-15 ENCOUNTER — Other Ambulatory Visit (HOSPITAL_COMMUNITY): Payer: Self-pay

## 2022-01-15 ENCOUNTER — Encounter: Payer: Self-pay | Admitting: Orthopedic Surgery

## 2022-01-15 ENCOUNTER — Other Ambulatory Visit: Payer: Self-pay

## 2022-01-15 DIAGNOSIS — E538 Deficiency of other specified B group vitamins: Secondary | ICD-10-CM | POA: Diagnosis not present

## 2022-01-15 DIAGNOSIS — M545 Low back pain, unspecified: Secondary | ICD-10-CM

## 2022-01-15 MED ORDER — HYDROCODONE-ACETAMINOPHEN 5-325 MG PO TABS
1.0000 | ORAL_TABLET | Freq: Two times a day (BID) | ORAL | 0 refills | Status: DC | PRN
Start: 2022-01-15 — End: 2022-02-19
  Filled 2022-01-15: qty 30, 15d supply, fill #0

## 2022-01-15 MED ORDER — CYANOCOBALAMIN 1000 MCG/ML IJ SOLN
INTRAMUSCULAR | 0 refills | Status: DC
  Filled 2022-01-15: qty 1, 30d supply, fill #0

## 2022-01-15 NOTE — Progress Notes (Addendum)
Post-Op Visit Note   Patient: Christine Brady           Date of Birth: 1961/09/01           MRN: [id number] Visit Date: 01/15/2022 PCP: Ronita Hipps, MD   Assessment & Plan:  Chief Complaint:  Chief Complaint  Patient presents with   Other     Recheck knee   Visit Diagnoses:  1. Low back pain, unspecified back pain laterality, unspecified chronicity, unspecified whether sciatica present     Plan: Christine Brady is a 61 year old patient underwent right knee arthroscopy with partial lateral meniscectomy and MPFL reconstruction on 10/05/2021.  She has been having some pain in the knee but has done therapy to improve her range of motion.  She has ITP and had a rash on the right leg.  That has improved.  She also had a blood clot in the leg and she is on Eliquis.  She sees her hematologist on 01/20/2022.  Her main complaint is that both feet are still numb.  MRI scan pending for Monday on the lumbar spine.  She does have low B12 but has received treatment for that over the past 3 to 4 weeks.  She is having trouble sleeping.  On examination the right knee does have improved range of motion.  Patella has good stability and a little bit more medial to lateral and superior to inferior mobility.  She does not have excessive pain to palpation but CRPS is a remote possibility in this case but that does not explain bilateral foot numbness.  I think her best bet is nerve study with neurology and MRI scan on Monday with follow-up with Dr. 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  Hopefully her oncologist/hematologist will take her off Eliquis on 01/20/2022 so she can get the injection about 5 days later.  I will see her back 10 days after the injection sometime in late February.  No calf tenderness today.  Skin itself is not warmer on the right side than the left.  Range of motion is to 90 degrees fairly easily today.  Norco refilled x1 today  Follow-Up Instructions: No follow-ups on file.    Orders:  Orders Placed This Encounter  Procedures   Ambulatory referral to Physical Medicine Rehab   Meds ordered this encounter  Medications   HYDROcodone-acetaminophen (NORCO/VICODIN) 5-325 MG tablet    Sig: Take 1 tablet by mouth every 12 (twelve) hours as needed for moderate pain.    Dispense:  30 tablet    Refill:  0    Imaging: No results found.  PMFS History: Patient Active Problem List   Diagnosis Date Noted   Patellar instability of right knee    Complex tear of lateral meniscus of right knee    Loose body in knee, right knee    Constipation 06/05/2021   Diverticular disease of colon 06/05/2021   Family history of other diseases of the digestive system 06/05/2021   Nausea 06/05/2021   Otalgia, left 06/05/2021   Right upper quadrant pain 06/05/2021   Acute lateral meniscus tear of right knee 06/05/2021   Patellar dislocation, right, sequela 06/05/2021   Diplacusis of left ear 10/02/2020   History of right mastoidectomy 04/10/2020   Postop check 02/29/2020   Bowel obstruction ([place]) 09/28/2019   Idiopathic thrombocytopenia ([place]) 09/28/2019   Eustachian tube dysfunction, bilateral 09/28/2019   Tinnitus aurium, bilateral 09/28/2019   Tympanic membrane central perforation, 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  Unspecified cholesteatoma, left ear 09/28/2019   IBS (irritable bowel syndrome) 07/09/2019   Headache 07/09/2019   GERD (gastroesophageal reflux disease) 07/09/2019   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 07/03/2019 07/03/2019   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   Menopausal syndrome 03/13/2018   History of postoperative nausea 01/19/2017   Atypical chest pain 11/11/2016   Hyperlipidemia 11/11/2016   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[place]) 11/11/2016   Epidermoid cyst on back 10/28/2011   Past Medical History:  Diagnosis Date   Abdominal pain    Arthritis    difficulty walking   Atypical chest pain 11/11/2016   Bowel obstruction ([facility])     Cholesteatoma of right ear    Chronic headaches    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[place]) 11/11/2016   Cyst    back   Hearing aid worn    History of ITP    Hyperlipidemia 11/11/2016   Large ovary 07/09/2019   PONV (postoperative nausea and vomiting)    Reflux     Family History  Problem Relation Age of Onset   Diabetes Mother    Asthma Mother    Diabetes type II Mother    Alcohol abuse Mother    Hypertension Mother    Diabetes Father    Hypertension Father    Kidney disease Father    Diabetes type II Father    Alcohol abuse Father    Crohn's disease Sister    Cerebral palsy Brother    COPD Sister    Thrombocytopenia Sister     Past Surgical History:  Procedure Laterality Date   CHOLECYSTECTOMY  2010   ESOPHAGOGASTRODUODENOSCOPY (EGD) WITH PROPOFOL N/A 06/12/2019   Procedure: ESOPHAGOGASTRODUODENOSCOPY (EGD) WITH PROPOFOL;  Surgeon: Carol Ada, MD;  Location: [facility];  Service: Endoscopy;  Laterality: N/A;   EXTERNAL EAR SURGERY  2009/2010   EXTERNAL EAR SURGERY  2010   right ear   KNEE ARTHROSCOPY     right   KNEE ARTHROSCOPY WITH MEDIAL PATELLAR FEMORAL LIGAMENT RECONSTRUCTION Right 10/05/2021   Procedure: right knee arthroscoy, debridement, lateral menisectomy, medial patellar femoral ligament reconstruction;  Surgeon: Meredith Pel, MD;  Location: [facility];  Service: Orthopedics;  Laterality: Right;   KNEE LIGAMENT RECONSTRUCTION     left   MASTOIDECTOMY Right 01/19/2017   Procedure: RIGHT MODIFIED RADICAL MASTOIDECTOMY;  Surgeon: Vicie Mutters, MD;  Location: [employer];  Service: ENT;  Laterality: Right;   UPPER ESOPHAGEAL ENDOSCOPIC ULTRASOUND (EUS) N/A 06/12/2019   Procedure: UPPER ESOPHAGEAL ENDOSCOPIC ULTRASOUND (EUS);  Surgeon: Carol Ada, MD;  Location: [facility];  Service: Endoscopy;  Laterality: N/A;   WISDOM TOOTH EXTRACTION     Social History   Occupational History   Not on file  Tobacco Use    Smoking status: Never   Smokeless tobacco: Never  Vaping Use   Vaping Use: Never used  Substance and Sexual Activity   Alcohol use: Yes    Comment: occas   Drug use: No   Sexual activity: Not on file

## 2022-01-18 ENCOUNTER — Encounter: Payer: Self-pay | Admitting: Rehabilitative and Restorative Service Providers"

## 2022-01-18 ENCOUNTER — Other Ambulatory Visit: Payer: Self-pay

## 2022-01-18 ENCOUNTER — Encounter (HOSPITAL_COMMUNITY): Payer: Self-pay

## 2022-01-18 ENCOUNTER — Ambulatory Visit (HOSPITAL_COMMUNITY): Admission: RE | Admit: 2022-01-18 | Payer: 59 | Source: Ambulatory Visit

## 2022-01-18 ENCOUNTER — Encounter: Payer: 59 | Admitting: Rehabilitative and Restorative Service Providers"

## 2022-01-18 ENCOUNTER — Encounter: Payer: Self-pay | Admitting: Orthopedic Surgery

## 2022-01-18 ENCOUNTER — Ambulatory Visit: Payer: 59 | Admitting: Rehabilitative and Restorative Service Providers"

## 2022-01-18 ENCOUNTER — Other Ambulatory Visit (HOSPITAL_COMMUNITY): Payer: Self-pay

## 2022-01-18 DIAGNOSIS — G8929 Other chronic pain: Secondary | ICD-10-CM | POA: Diagnosis not present

## 2022-01-18 DIAGNOSIS — R262 Difficulty in walking, not elsewhere classified: Secondary | ICD-10-CM | POA: Diagnosis not present

## 2022-01-18 DIAGNOSIS — R600 Localized edema: Secondary | ICD-10-CM

## 2022-01-18 DIAGNOSIS — M6281 Muscle weakness (generalized): Secondary | ICD-10-CM

## 2022-01-18 DIAGNOSIS — M25661 Stiffness of right knee, not elsewhere classified: Secondary | ICD-10-CM

## 2022-01-18 DIAGNOSIS — M25561 Pain in right knee: Secondary | ICD-10-CM | POA: Diagnosis not present

## 2022-01-18 DIAGNOSIS — M545 Low back pain, unspecified: Secondary | ICD-10-CM

## 2022-01-18 NOTE — Therapy (Signed)
[facility] [place], [place], [address] Phone: [phone]   Fax:  [phone]  Physical Therapy Treatment  Patient Details  Name: Christine Brady MRN: [id number] Date of Birth: 05-07-1961 Referring Provider (PT): Marcene Duos   Encounter Date: 01/18/2022   PT End of Session - 01/18/22 1521     Visit Number 7    Number of Visits 13    Date for PT Re-Evaluation 02/12/22    Authorization Type [employer] [employer]    Progress Note Due on Visit 10    PT Start Time 1513    PT Stop Time 1552    PT Time Calculation (min) 39 min    Activity Tolerance Patient tolerated treatment well    Behavior During Therapy [facility] for tasks assessed/performed             Past Medical History:  Diagnosis Date   Abdominal pain    Arthritis    difficulty walking   Atypical chest pain 11/11/2016   Bowel obstruction ([place])    Cholesteatoma of right ear    Chronic headaches    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[place]) 11/11/2016   Cyst    back   Hearing aid worn    History of ITP    Hyperlipidemia 11/11/2016   Large ovary 07/09/2019   PONV (postoperative nausea and vomiting)    Reflux     Past Surgical History:  Procedure Laterality Date   CHOLECYSTECTOMY  2010   ESOPHAGOGASTRODUODENOSCOPY (EGD) WITH PROPOFOL N/A 06/12/2019   Procedure: ESOPHAGOGASTRODUODENOSCOPY (EGD) WITH PROPOFOL;  Surgeon: Carol Ada, MD;  Location: [facility];  Service: Endoscopy;  Laterality: N/A;   EXTERNAL EAR SURGERY  2009/2010   EXTERNAL EAR SURGERY  2010   right ear   KNEE ARTHROSCOPY     right   KNEE ARTHROSCOPY WITH MEDIAL PATELLAR FEMORAL LIGAMENT RECONSTRUCTION Right 10/05/2021   Procedure: right knee arthroscoy, debridement, lateral menisectomy, medial patellar femoral ligament reconstruction;  Surgeon: Meredith Pel, MD;  Location: [facility];  Service: Orthopedics;  Laterality: Right;   KNEE LIGAMENT RECONSTRUCTION     left    MASTOIDECTOMY Right 01/19/2017   Procedure: RIGHT MODIFIED RADICAL MASTOIDECTOMY;  Surgeon: Vicie Mutters, MD;  Location: [facility];  Service: ENT;  Laterality: Right;   UPPER ESOPHAGEAL ENDOSCOPIC ULTRASOUND (EUS) N/A 06/12/2019   Procedure: UPPER ESOPHAGEAL ENDOSCOPIC ULTRASOUND (EUS);  Surgeon: Carol Ada, MD;  Location: [facility];  Service: Endoscopy;  Laterality: N/A;   WISDOM TOOTH EXTRACTION      There were no vitals filed for this visit.   Subjective Assessment - 01/18/22 1517     Subjective Pt. indicated MRI wasn't performed today due to complications related to set up.  Pt. indicated suggestion from MD about using leg press in flat position to avoid low back pain.    Pertinent History : rt knee arthroscopy with lateral menisectomy, 10/05/2021  Hearing aid, reflux, arthritis, bowl obstruction, h/o baker's cyst, hyperlipidemia    Limitations Other (comment);Writing;House hold activities;Lifting;Standing    Patient Stated Goals Get back to working in my yard and stop hurting    Currently in Pain? Yes    Pain Score 3     Pain Location Knee    Pain Orientation Right    Pain Descriptors / Indicators Aching;Shooting;Tightness    Pain Type Chronic pain;Surgical pain    Pain Onset More than a month ago    Pain Frequency Intermittent    Aggravating Factors  nighttime,  end range, prolonged WB    Pain Relieving Factors medicine                               [facility] Adult PT Treatment/Exercise - 01/18/22 0001       Neuro Re-ed    Neuro Re-ed Details  fitter wobble board fwd/back each way 30x, retro step 20x Rt leg posterior on foam      Knee/Hip Exercises: Stretches   Gastroc Stretch 30 seconds;3 reps;Both   incline board     Knee/Hip Exercises: Aerobic   Other Aerobic UBE LE only lvl 2.0 8 mins seat 7      Knee/Hip Exercises: Machines for Strengthening   Cybex Leg Press Back flat: Double leg 75 lbs 2 x 15, single leg 25 lbs 2 x 15 bilateral       Knee/Hip Exercises: Standing   Terminal Knee Extension 10 reps;Right   10 seconds blue band     Knee/Hip Exercises: Seated   Other Seated Knee/Hip Exercises seated SLR 2 x 10 bilateral slow eccentric      Vasopneumatic   Number Minutes Vasopneumatic  --    Vasopnuematic Location  --    Vasopneumatic Pressure --    Vasopneumatic Temperature  --                       PT Short Term Goals - 01/13/22 1513       PT SHORT TERM GOAL #1   Title Pt will be independent in her initial HEP    Time 3    Period Weeks    Status Achieved    Target Date 01/15/22      PT SHORT TERM GOAL #2   Title Pt will improve her 5 time sit to stand to </= 12 seconds with no UE support.    Time 3    Period Weeks    Status On-going    Target Date 01/15/22               PT Long Term Goals - 12/28/21 1656       PT LONG TERM GOAL #1   Title Pt will be independent in her advanced HEP.    Time 6    Period Weeks    Status New    Target Date 02/12/22      PT LONG TERM GOAL #2   Title Pt will improve her FOTO score from 37% tp 60%    Time 6    Period Weeks    Status New    Target Date 02/12/22      PT LONG TERM GOAL #3   Title Pt will improve her right knee flexion to >/= 125 degrees in order to improve functional mobility.    Time 6    Period Weeks    Status New    Target Date 02/12/22      PT LONG TERM GOAL #4   Title Pt will be able to improve her right knee extension to 5/5.    Time 6    Period Weeks    Status New    Target Date 02/12/22      PT LONG TERM GOAL #5   Title Pt will be able to work full shift with pain </= 2/10 in her right knee.    Time 6    Period Weeks    Status New  Target Date 02/12/22                   Plan - 01/18/22 1549     Clinical Impression Statement Adjustment of leg press to have back flat to avoid any exacerbation related to low back/radicular symptoms that may be present (no speciifc complaints noted to this point from  leg press itself).  Difficulty/pain noted c step up attempt so withheld today.  Continued focus on strength and mobility gains to improve Rt leg performance in functional activity.    Personal Factors and Comorbidities Comorbidity 3+    Comorbidities rt knee arthroscopy with lateral menisectomy, 10/05/2021  Hearing aid, reflux, arthritis, bowl obstruction, h/o baker's cyst, hyperlipidemia    Examination-Activity Limitations Other;Squat;Stairs;Stand;Sleep    Examination-Participation Restrictions Community Activity;Other;Occupation;Yard Work    Stability/Clinical Decision Making Stable/Uncomplicated    Rehab Potential Good    PT Frequency 2x / week    PT Duration 6 weeks    PT Treatment/Interventions ADLs/Self Care Home Management;Cryotherapy;Gait training;Stair training;Functional mobility training;Therapeutic activities;Balance training;Therapeutic exercise;Patient/family education;Manual techniques;Dry needling;Taping;Vasopneumatic Device;Passive range of motion;Joint Manipulations;Neuromuscular re-education    PT Next Visit Plan Reassess use of step for functional strengthening, quad strengthening continued.    PT Home Exercise Plan Access Code: [email or web address]  URL: [email or web address]    Consulted and Agree with Plan of Care Patient             Patient will benefit from skilled therapeutic intervention in order to improve the following deficits and impairments:  Pain, Increased edema, Decreased strength, Impaired flexibility, Decreased activity tolerance, Difficulty walking, Decreased range of motion  Visit Diagnosis: Chronic pain of right knee  Muscle weakness (generalized)  Difficulty in walking, not elsewhere classified  Stiffness of right knee, not elsewhere classified  Localized edema     Problem List Patient Active Problem List   Diagnosis Date Noted   Patellar instability of right knee    Complex tear of lateral meniscus of right knee    Loose body in  knee, right knee    Constipation 06/05/2021   Diverticular disease of colon 06/05/2021   Family history of other diseases of the digestive system 06/05/2021   Nausea 06/05/2021   Otalgia, left 06/05/2021   Right upper quadrant pain 06/05/2021   Acute lateral meniscus tear of right knee 06/05/2021   Patellar dislocation, right, sequela 06/05/2021   Diplacusis of left ear 10/02/2020   History of right mastoidectomy 04/10/2020   Postop check 02/29/2020   Bowel obstruction ([place]) 09/28/2019   Idiopathic thrombocytopenia ([place]) 09/28/2019   Eustachian tube dysfunction, bilateral 09/28/2019   Tinnitus aurium, bilateral 09/28/2019   Tympanic membrane central perforation, 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   Unspecified cholesteatoma, left ear 09/28/2019   IBS (irritable bowel syndrome) 07/09/2019   Headache 07/09/2019   GERD (gastroesophageal reflux disease) 07/09/2019   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

## 2022-01-18 NOTE — Telephone Encounter (Signed)
Ok for ct myelogram l spine thx

## 2022-01-20 ENCOUNTER — Encounter: Payer: Self-pay | Admitting: *Deleted

## 2022-01-20 ENCOUNTER — Other Ambulatory Visit: Payer: Self-pay

## 2022-01-20 ENCOUNTER — Telehealth: Payer: Self-pay

## 2022-01-20 ENCOUNTER — Inpatient Hospital Stay: Payer: 59 | Attending: Oncology

## 2022-01-20 ENCOUNTER — Ambulatory Visit: Payer: 59 | Admitting: Rehabilitative and Restorative Service Providers"

## 2022-01-20 ENCOUNTER — Encounter: Payer: Self-pay | Admitting: Rehabilitative and Restorative Service Providers"

## 2022-01-20 ENCOUNTER — Inpatient Hospital Stay (HOSPITAL_BASED_OUTPATIENT_CLINIC_OR_DEPARTMENT_OTHER): Payer: 59 | Admitting: Oncology

## 2022-01-20 VITALS — BP 123/82 | HR 66 | Temp 97.8°F | Resp 18 | Ht 62.0 in | Wt 140.8 lb

## 2022-01-20 DIAGNOSIS — R262 Difficulty in walking, not elsewhere classified: Secondary | ICD-10-CM

## 2022-01-20 DIAGNOSIS — R748 Abnormal levels of other serum enzymes: Secondary | ICD-10-CM | POA: Insufficient documentation

## 2022-01-20 DIAGNOSIS — Z86718 Personal history of other venous thrombosis and embolism: Secondary | ICD-10-CM | POA: Insufficient documentation

## 2022-01-20 DIAGNOSIS — G8929 Other chronic pain: Secondary | ICD-10-CM

## 2022-01-20 DIAGNOSIS — M25561 Pain in right knee: Secondary | ICD-10-CM | POA: Diagnosis not present

## 2022-01-20 DIAGNOSIS — D693 Immune thrombocytopenic purpura: Secondary | ICD-10-CM

## 2022-01-20 DIAGNOSIS — E538 Deficiency of other specified B group vitamins: Secondary | ICD-10-CM | POA: Insufficient documentation

## 2022-01-20 DIAGNOSIS — M25661 Stiffness of right knee, not elsewhere classified: Secondary | ICD-10-CM | POA: Diagnosis not present

## 2022-01-20 DIAGNOSIS — Z7901 Long term (current) use of anticoagulants: Secondary | ICD-10-CM | POA: Insufficient documentation

## 2022-01-20 DIAGNOSIS — M6281 Muscle weakness (generalized): Secondary | ICD-10-CM

## 2022-01-20 DIAGNOSIS — Z8041 Family history of malignant neoplasm of ovary: Secondary | ICD-10-CM | POA: Insufficient documentation

## 2022-01-20 DIAGNOSIS — Z85828 Personal history of other malignant neoplasm of skin: Secondary | ICD-10-CM | POA: Diagnosis not present

## 2022-01-20 DIAGNOSIS — R600 Localized edema: Secondary | ICD-10-CM | POA: Diagnosis not present

## 2022-01-20 LAB — CMP (CANCER CENTER ONLY)
ALT: 319 U/L (ref 0–44)
AST: 149 U/L — ABNORMAL HIGH (ref 15–41)
Albumin: 4.5 g/dL (ref 3.5–5.0)
Alkaline Phosphatase: 69 U/L (ref 38–126)
Anion gap: 9 (ref 5–15)
BUN: 11 mg/dL (ref 6–20)
CO2: 29 mmol/L (ref 22–32)
Calcium: 9.7 mg/dL (ref 8.9–10.3)
Chloride: 101 mmol/L (ref 98–111)
Creatinine: 0.56 mg/dL (ref 0.44–1.00)
GFR, Estimated: >60 mL/min (ref >60)
Glucose, Bld: 89 mg/dL (ref 70–99)
Potassium: 4.3 mmol/L (ref 3.5–5.1)
Sodium: 139 mmol/L (ref 135–145)
Total Bilirubin: 0.6 mg/dL (ref 0.3–1.2)
Total Protein: 6.7 g/dL (ref 6.5–8.1)

## 2022-01-20 LAB — CBC WITH DIFFERENTIAL (CANCER CENTER ONLY)
Abs Immature Granulocytes: 0 10*3/uL (ref 0.00–0.07)
Basophils Absolute: 0 10*3/uL (ref 0.0–0.1)
Basophils Relative: 1 %
Eosinophils Absolute: 0.1 10*3/uL (ref 0.0–0.5)
Eosinophils Relative: 1 %
HCT: 38.7 % (ref 36.0–46.0)
Hemoglobin: 12.6 g/dL (ref 12.0–15.0)
Immature Granulocytes: 0 %
Lymphocytes Relative: 38 %
Lymphs Abs: 1.8 10*3/uL (ref 0.7–4.0)
MCH: 31.9 pg (ref 26.0–34.0)
MCHC: 32.6 g/dL (ref 30.0–36.0)
MCV: 98 fL (ref 80.0–100.0)
Monocytes Absolute: 0.3 10*3/uL (ref 0.1–1.0)
Monocytes Relative: 6 %
Neutro Abs: 2.6 10*3/uL (ref 1.7–7.7)
Neutrophils Relative %: 54 %
Platelet Count: 119 10*3/uL — ABNORMAL LOW (ref 150–400)
RBC: 3.95 MIL/uL (ref 3.87–5.11)
RDW: 12.9 % (ref 11.5–15.5)
WBC Count: 4.8 10*3/uL (ref 4.0–10.5)
nRBC: 0 % (ref 0.0–0.2)

## 2022-01-20 NOTE — Progress Notes (Addendum)
CRITICAL VALUE STICKER  CRITICAL VALUE: ALT 319  RECEIVER (on-site recipient of call):Nicandro Perrault,RN  DATE & TIME NOTIFIED: 01/20/22 2:10  MESSENGER (representative from lab):[name]  MD NOTIFIED: Dr. Benay Spice  TIME OF NOTIFICATION: 12:10  RESPONSE:  Re-check liver functions in 1-2 weeks.

## 2022-01-20 NOTE — Telephone Encounter (Signed)
TC to Pt to inform her of elevated liver enzymes (ALT 319) asked Pt if she drinks. Pt stated she drinks occasionally, but has been taking 1300 mg of tylenol everyday since her knee surgery. Informed Pt stated she will stop taking tylenol. Informed Pt that I agreed. Informed Pt I will discuss this with Dr Benay Spice. Discussed with Dr Benay Spice who stated we will follow up with a repeat CMP next week.

## 2022-01-20 NOTE — Telephone Encounter (Signed)
-----   Message from Velna Hatchet, LPN sent at 08/16/4460  3:01 PM EST -----  ----- Message ----- From: Ladell Pier, MD Sent: 01/20/2022   1:45 PM EST To: [facility]  Please call patient, the liver enzymes are elevated, repeat 7-10 days, if she drinking alcohol?,  Elevated liver enzymes could be related to her cholesterol medication  We will investigate further if the repeat liver enzymes returned elevated  Follow-up as scheduled

## 2022-01-20 NOTE — Progress Notes (Signed)
[name]   Requesting MD: Physicians, [place],  [place] [address]   Christine Brady 61 y.o.  Jul 17, 1961    Reason for Consult: DVT   HPI: Christine Brady underwent a right knee arthroscopy with removal of a loose body from the anterior compartment and open medial patellofemoral ligament reconstruction on 10/05/2021.  She reports being nonambulatory for approximately 3 weeks following surgery and then began physical therapy.  She remains limited in her ability ambulate due to limited motion at the right knee. She reports beginning approximately 3 weeks following surgery she noted the onset of numbness at the soles of both feet.  She now has mild numbness extending to the distal lower leg bilaterally.  She also has a rash at the pretibial area that began approximately 3 weeks following surgery.  She reports the rash resolved when she took a course of prednisone.  She saw her primary provider in late December.  She reports a bilateral Doppler was obtained due to the rash in the lower legs.  The D-dimer returned elevated on 12/09/2021.  A bilateral lower extremity Doppler on 12/09/2021 confirmed acute deep vein thrombosis involving the left peroneal veins.  She began apixaban anticoagulation.  She did not note swelling or leg pain at the time.  She continues apixaban.  No bleeding.  She has persistent numbness in the lower legs and feet.  She has been referred to neurology.  The rash at the lower legs persists.   She denies prolonged travel and trauma other than the right knee surgery prior to the DVT diagnosis.  No previous history of thromboembolic disease.  She has chronic thrombocytopenia dating to the 1980s felt to be secondary to chronic ITP.   She developed right lower back pain approximately 3 weeks ago.  She saw Dr. Marlou Sa.  She reports she was unable to undergo an MRI due to the implanted hearing aid.  She is being  scheduled for a myelogram.  Past Medical History:  Diagnosis Date   Abdominal pain    Arthritis    difficulty walking   Atypical chest pain 11/11/2016   Bowel obstruction (HCC)    Cholesteatoma of right ear    Chronic headaches    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[facility]) 11/11/2016   Cyst    back   Hearing aid worn    History of ITP    Hyperlipidemia 11/11/2016   Large ovary 07/09/2019   PONV (postoperative nausea and vomiting)    Reflux     .  Left peroneal DVT                                                                                                            12/09/2021   .  G0, P0   .  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  Past Surgical History:  Procedure Laterality Date   CHOLECYSTECTOMY  2010   ESOPHAGOGASTRODUODENOSCOPY (EGD) WITH PROPOFOL N/A 06/12/2019   Procedure: ESOPHAGOGASTRODUODENOSCOPY (EGD) WITH PROPOFOL;  Surgeon: Carol Ada, MD;  Location: [facility];  Service: Endoscopy;  Laterality: N/A;   EXTERNAL EAR SURGERY  2009/2010   EXTERNAL EAR SURGERY  2010   right ear   KNEE ARTHROSCOPY     right   KNEE ARTHROSCOPY WITH MEDIAL PATELLAR FEMORAL LIGAMENT RECONSTRUCTION Right 10/05/2021   Procedure: right knee arthroscoy, debridement, lateral menisectomy, medial patellar femoral ligament reconstruction;  Surgeon: Meredith Pel, MD;  Location: [facility];  Service: Orthopedics;  Laterality: Right;   KNEE LIGAMENT RECONSTRUCTION     left   MASTOIDECTOMY Right 01/19/2017   Procedure: RIGHT MODIFIED RADICAL MASTOIDECTOMY;  Surgeon: Vicie Mutters, MD;  Location: [facility];  Service: ENT;  Laterality: Right;   UPPER ESOPHAGEAL ENDOSCOPIC ULTRASOUND (EUS) N/A 06/12/2019   Procedure: UPPER ESOPHAGEAL ENDOSCOPIC ULTRASOUND (EUS);  Surgeon: Carol Ada, MD;  Location: [facility];  Service: Endoscopy;  Laterality: N/A;   WISDOM TOOTH EXTRACTION      .  Proximal colectomy for volvulus July 2020  Medications:  Reviewed  Allergies:  Allergies  Allergen Reactions   Loratadine     Other reaction(s): Unknown   Oxycodone     Skin crawling   Claritin-D [Loratadine-Pseudoephedrine Er] Rash   Codeine Rash    Family history: A sister has ITP and underwent a splenectomy.  She was diagnosed with "cancer "at age 44, a sister has SLE/Sjogren's and pulmonary fibrosis, a sister had ovarian cancer and pulmonary fibrosis, a niece has SLE/Sjogren's, a nephew has pulmonary fibrosis, her mother had miscarriages, a brother died of squamous cell carcinoma of the skin  Social History:   She lives alone in [place].  She works at the [employer].  She received a platelet transfusion prior to your surgery..  No risk factor for HIV or hepatitis.  ROS:   Positives include: Urinary tract infection 1.5 weeks ago treated with antibiotics, intermittent nausea following volvulus surgery, right lower back pain for the past 3 weeks  A complete ROS was otherwise negative.  Physical Exam:  Blood pressure 123/82, pulse 66, temperature 97.8 F (36.6 C), temperature source Oral, resp. rate 18, height 5\' 2"  (1.575 m), weight 140 lb 12.8 oz (63.9 kg), SpO2 100 %.  HEENT: Oral cavity without visible mass, neck without mass Lungs: Clear bilaterally Cardiac: Regular rate and rhythm Abdomen: No hepatosplenomegaly, no mass, nontender  Vascular: No leg edema, left leg without erythema or tenderness Lymph nodes: No cervical, supraclavicular, axillary, or inguinal nodes Neurologic: Alert and oriented, the motor exam appears intact in the upper and lower extremities bilaterally, decreased sensation to light touch and pinprick at the soles Skin: Fine erythematous/purpuric rash at the pretibial area bilaterally Musculoskeletal: Tender at the right posterior iliac, no spine tenderness, joints without erythema or edema aside from mild swelling surrounding the right knee   LAB:  CBC  Lab Results  Component Value Date    WBC 4.8 01/20/2022   HGB 12.6 01/20/2022   HCT 38.7 01/20/2022   MCV 98.0 01/20/2022   PLT 119 (L) 01/20/2022   NEUTROABS 2.6 01/20/2022        CMP  Lab Results  Component Value Date   NA 139 01/20/2022   K 4.3 01/20/2022   CL 101 01/20/2022   CO2 29 01/20/2022   GLUCOSE 89 01/20/2022   BUN 11 01/20/2022   CREATININE 0.56 01/20/2022   CALCIUM 9.7 01/20/2022   PROT 6.7 01/20/2022   ALBUMIN 4.5 01/20/2022   AST 149 (H) 01/20/2022  ALT 319 (HH) 01/20/2022   ALKPHOS 69 01/20/2022   BILITOT 0.6 01/20/2022   GFRNONAA >60 01/20/2022   GFRAA >60 07/12/2019      Assessment/Plan:   Left peroneal DVT 12/09/2021-apixaban  Chronic ITP  3.   Right knee arthroscopy with partial lateral meniscectomy and removal of loose body, open patellofemoral ligament reconstruction 10/05/2021  4.   Fine "petechial "rash at the pretibial area bilaterally  5.   Right lower back pain  6.   Family history of pulmonary fibrosis  7.   Family history of SLE/Sjogren's  8.  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  Family history of ovarian cancer  10.  Elevated liver enzyme  11.  Hearing loss  12.  Vitamin B12 deficiency diagnosed January 2023    Disposition:   Christine Brady is referred for hematology evaluation after being diagnosed with a left lower extremity DVT.  She has no previous history of venous thromboembolic disease.  The DVT may be related to the period of immobility following right knee surgery in October 2022.  However the DVT is in the contralateral extremity and was noted 2 months following the procedure.  She has multiple symptoms at present including foot numbness and right lower back pain.  She was recently diagnosed with vitamin B12 deficiency.  This could explain the foot numbness and may improve with B12 replacement.  The liver enzymes are elevated today.  There is no clear explanation for this.  It is possible the elevated liver enzymes are related to her  medical regimen, rosuvastatin.  I will recommend she have repeat liver enzymes within the next 1-2 weeks.  There is no finding to suggest an underlying malignancy, but this is possible.  I will follow-up on results of the lumbar myelogram, myeloma panel submitted today, and repeat liver enzymes.  We will initiate additional imaging and diagnostic evaluation as indicated.  She will continue apixaban anticoagulation.  I see no reason to submit a hypercoagulation panel as this would not effect the recommended length of anticoagulation therapy.  She will return for an office visit in 4 weeks.  Betsy Coder, MD  01/20/2022, 1:19 PM

## 2022-01-20 NOTE — Therapy (Signed)
[place], [place], [address] Phone: [phone]   Fax:  [phone]  Physical Therapy Treatment  Patient Details  Name: Christine Brady MRN: [id number] Date of Birth: [birth year] Referring Provider (PT): Marcene Duos   Encounter Date: 01/20/2022   PT End of Session - 01/20/22 1546     Visit Number 8    Number of Visits 13    Date for PT Re-Evaluation 02/12/22    Authorization Type [facility] Employee    Progress Note Due on Visit 10    PT Start Time 1510    PT Stop Time 1600    PT Time Calculation (min) 50 min    Activity Tolerance Patient tolerated treatment well    Behavior During Therapy [facility] for tasks assessed/performed             Past Medical History:  Diagnosis Date   Abdominal pain    Arthritis    difficulty walking   Atypical chest pain 11/11/2016   Bowel obstruction ([facility])    Cholesteatoma of right ear    Chronic headaches    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[facility]) 11/11/2016   Cyst    back   Hearing aid worn    History of ITP    Hyperlipidemia 11/11/2016   Large ovary 07/09/2019   PONV (postoperative nausea and vomiting)    Reflux     Past Surgical History:  Procedure Laterality Date   CHOLECYSTECTOMY  2010   ESOPHAGOGASTRODUODENOSCOPY (EGD) WITH PROPOFOL N/A 06/12/2019   Procedure: ESOPHAGOGASTRODUODENOSCOPY (EGD) WITH PROPOFOL;  Surgeon: Carol Ada, MD;  Location: [facility];  Service: Endoscopy;  Laterality: N/A;   EXTERNAL EAR SURGERY  2009/2010   EXTERNAL EAR SURGERY  2010   right ear   KNEE ARTHROSCOPY     right   KNEE ARTHROSCOPY WITH MEDIAL PATELLAR FEMORAL LIGAMENT RECONSTRUCTION Right 10/05/2021   Procedure: right knee arthroscoy, debridement, lateral menisectomy, medial patellar femoral ligament reconstruction;  Surgeon: Meredith Pel, MD;  Location: [facility];  Service: Orthopedics;  Laterality: Right;   KNEE LIGAMENT RECONSTRUCTION     left    MASTOIDECTOMY Right 01/19/2017   Procedure: RIGHT MODIFIED RADICAL MASTOIDECTOMY;  Surgeon: Vicie Mutters, MD;  Location: [facility];  Service: ENT;  Laterality: Right;   UPPER ESOPHAGEAL ENDOSCOPIC ULTRASOUND (EUS) N/A 06/12/2019   Procedure: UPPER ESOPHAGEAL ENDOSCOPIC ULTRASOUND (EUS);  Surgeon: Carol Ada, MD;  Location: [facility];  Service: Endoscopy;  Laterality: N/A;   WISDOM TOOTH EXTRACTION      There were no vitals filed for this visit.   Subjective Assessment - 01/20/22 1514     Subjective Pt. indicated that leg press alignment change did not help so plan to avoid use of it due to back symptoms.  Pt. had testing related to liver show elevated and was instructed to discontinue use of Tylenol as a result.    Pertinent History : rt knee arthroscopy with lateral menisectomy, 10/05/2021  Hearing aid, reflux, arthritis, bowl obstruction, h/o baker's cyst, hyperlipidemia    Limitations Other (comment);Writing;House hold activities;Lifting;Standing    Patient Stated Goals Get back to working in my yard and stop hurting    Currently in Pain? Yes    Pain Score 4     Pain Location Knee    Pain Orientation Right    Pain Descriptors / Indicators Aching;Tightness;Sore    Pain Type Chronic pain    Pain Onset More than a month ago    Pain  Frequency Intermittent    Aggravating Factors  leg press hurts back, knee continued simillar with prolonged walking/standing, nighttime pain    Pain Relieving Factors medication                [facility] PT Assessment - 01/20/22 0001       Assessment   Medical Diagnosis M25.561 right knee pain    Referring Provider (PT) Marcene Duos    Onset Date/Surgical Date 10/05/21    Hand Dominance Right      Precautions   Precaution Comments No leg pres due to back symptoms      Ambulation/Gait   Ambulation/Gait Yes    Ambulation/Gait Assistance 7: Independent    Gait Pattern Decreased step length - left;Decreased stance time -  right;Decreased stride length;Decreased weight shift to right;Right flexed knee in stance;Antalgic                           [facility] Adult PT Treatment/Exercise - 01/20/22 0001       Knee/Hip Exercises: Aerobic   Recumbent Bike seat 5 4 mins lvl 2, seat 4 lvl 2 4 mins      Knee/Hip Exercises: Seated   Long Arc Quad Right   eccentric lowering Rt 4 lbs x 10, 6 lbs 2 x 10   Other Seated Knee/Hip Exercises seated isometric alternating extension/flexion Rt knee 10 sec hold x 9 each in mid range      Knee/Hip Exercises: Supine   Other Supine Knee/Hip Exercises supine sciatic nerve flossing Rt leg (ankle DF c knee flexion, ankle PF c knee extension) 2 x 10    Other Supine Knee/Hip Exercises supine Rt hip extension into table isometric 15 sec x 5 (cues for home use)      Vasopneumatic   Number Minutes Vasopneumatic  10 minutes    Vasopnuematic Location  Knee    Vasopneumatic Pressure Medium    Vasopneumatic Temperature  34      Manual Therapy   Manual therapy comments seated Rt knee flexion mobilization c movement c IR/distraction and contralateral leg movement opposite.                       PT Short Term Goals - 01/20/22 1549       PT SHORT TERM GOAL #1   Title Pt will be independent in her initial HEP    Time 3    Period Weeks    Status Achieved    Target Date 01/15/22      PT SHORT TERM GOAL #2   Title Pt will improve her 5 time sit to stand to </= 12 seconds with no UE support.    Time 3    Period Weeks    Status Partially Met    Target Date 01/15/22               PT Long Term Goals - 01/20/22 1549       PT LONG TERM GOAL #1   Title Pt will be independent in her advanced HEP.    Time 6    Period Weeks    Status On-going    Target Date 02/12/22      PT LONG TERM GOAL #2   Title Pt will improve her FOTO score from 37% tp 60%    Time 6    Period Weeks    Status On-going    Target Date 02/12/22  PT LONG TERM GOAL #3    Title Pt will improve her right knee flexion to >/= 125 degrees in order to improve functional mobility.    Time 6    Period Weeks    Status On-going    Target Date 02/12/22      PT LONG TERM GOAL #4   Title Pt will be able to improve her right knee extension to 5/5.    Time 6    Period Weeks    Status On-going    Target Date 02/12/22      PT LONG TERM GOAL #5   Title Pt will be able to work full shift with pain </= 2/10 in her right knee.    Time 6    Period Weeks    Status On-going    Target Date 02/12/22                   Plan - 01/20/22 1515     Clinical Impression Statement Plan to discontinue leg press use due to back symptoms. Pt. continued to have complaints in Rt knee c flexion and noted posterior knee/calf complaints in full knee extension positioning. Mild tenderness in calf noted to palpation today.  Pt. still taking blood thinner at this time (secondary to clot).  Adjusted quad strengthening to avoid leg press accordingly.    Personal Factors and Comorbidities Comorbidity 3+    Comorbidities rt knee arthroscopy with lateral menisectomy, 10/05/2021  Hearing aid, reflux, arthritis, bowl obstruction, h/o baker's cyst, hyperlipidemia    Examination-Activity Limitations Other;Squat;Stairs;Stand;Sleep    Examination-Participation Restrictions Community Activity;Other;Occupation;Yard Work    Stability/Clinical Decision Making Stable/Uncomplicated    Rehab Potential Good    PT Frequency 2x / week    PT Duration 6 weeks    PT Treatment/Interventions ADLs/Self Care Home Management;Cryotherapy;Gait training;Stair training;Functional mobility training;Therapeutic activities;Balance training;Therapeutic exercise;Patient/family education;Manual techniques;Dry needling;Taping;Vasopneumatic Device;Passive range of motion;Joint Manipulations;Neuromuscular re-education    PT Next Visit Plan Continue quad strengthening open chain, transfer.  Isometric hip extension/hamstring  continued.    PT Home Exercise Plan Access Code: [email or web address]  URL: [email or web address]    Consulted and Agree with Plan of Care Patient             Patient will benefit from skilled therapeutic intervention in order to improve the following deficits and impairments:  Pain, Increased edema, Decreased strength, Impaired flexibility, Decreased activity tolerance, Difficulty walking, Decreased range of motion  Visit Diagnosis: Chronic pain of right knee  Muscle weakness (generalized)  Difficulty in walking, not elsewhere classified  Stiffness of right knee, not elsewhere classified  Localized edema     Problem List Patient Active Problem List   Diagnosis Date Noted   Patellar instability of right knee    Complex tear of lateral meniscus of right knee    Loose body in knee, right knee    Constipation 06/05/2021   Diverticular disease of colon 06/05/2021   Family history of other diseases of the digestive system 06/05/2021   Nausea 06/05/2021   Otalgia, left 06/05/2021   Right upper quadrant pain 06/05/2021   Acute lateral meniscus tear of right knee 06/05/2021   Patellar dislocation, right, sequela 06/05/2021   Diplacusis of left ear 10/02/2020   History of right mastoidectomy 04/10/2020   Postop check 02/29/2020   Bowel obstruction (HCC) 09/28/2019   Idiopathic thrombocytopenia (HCC) 09/28/2019   Eustachian tube dysfunction, bilateral 09/28/2019   Tinnitus aurium, bilateral 09/28/2019   Tympanic membrane central perforation, 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  Unspecified cholesteatoma, left ear 09/28/2019   IBS (irritable bowel syndrome) 07/09/2019   Headache 07/09/2019   GERD (gastroesophageal reflux disease) 07/09/2019   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 [place], [place], [address] Phone: [phone]   Fax:  [phone]  Name: Christine Brady MRN: [id number] Date of Birth: 1961/12/03

## 2022-01-21 LAB — KAPPA/LAMBDA LIGHT CHAINS
Kappa free light chain: 19.2 mg/L (ref 3.3–19.4)
Kappa, lambda light chain ratio: 1.41 (ref 0.26–1.65)
Lambda free light chains: 13.6 mg/L (ref 5.7–26.3)

## 2022-01-22 ENCOUNTER — Other Ambulatory Visit (HOSPITAL_COMMUNITY): Payer: Self-pay

## 2022-01-22 ENCOUNTER — Encounter: Payer: Self-pay | Admitting: Orthopedic Surgery

## 2022-01-22 MED ORDER — APIXABAN 5 MG PO TABS
ORAL_TABLET | ORAL | 2 refills | Status: DC
  Filled 2022-01-22: qty 70, 30d supply, fill #0
  Filled 2022-03-15: qty 70, 30d supply, fill #1
  Filled 2022-04-20: qty 70, 30d supply, fill #2

## 2022-01-22 NOTE — Telephone Encounter (Signed)
Ok thx.

## 2022-01-25 ENCOUNTER — Encounter: Payer: 59 | Admitting: Rehabilitative and Restorative Service Providers"

## 2022-01-25 LAB — MULTIPLE MYELOMA PANEL, SERUM
Albumin SerPl Elph-Mcnc: 4.1 g/dL (ref 2.9–4.4)
Albumin/Glob SerPl: 1.6 (ref 0.7–1.7)
Alpha 1: 0.2 g/dL (ref 0.0–0.4)
Alpha2 Glob SerPl Elph-Mcnc: 0.7 g/dL (ref 0.4–1.0)
B-Globulin SerPl Elph-Mcnc: 0.8 g/dL (ref 0.7–1.3)
Gamma Glob SerPl Elph-Mcnc: 0.9 g/dL (ref 0.4–1.8)
Globulin, Total: 2.6 g/dL (ref 2.2–3.9)
IgA: 132 mg/dL (ref 87–352)
IgG (Immunoglobin G), Serum: 881 mg/dL (ref 586–1602)
IgM (Immunoglobulin M), Srm: 105 mg/dL (ref 26–217)
Total Protein ELP: 6.7 g/dL (ref 6.0–8.5)

## 2022-01-27 ENCOUNTER — Ambulatory Visit
Admission: RE | Admit: 2022-01-27 | Discharge: 2022-01-27 | Disposition: A | Discharge status: Home / Self Care | Payer: 59 | Source: Ambulatory Visit | Attending: Orthopedic Surgery | Admitting: Orthopedic Surgery

## 2022-01-27 ENCOUNTER — Encounter: Payer: 59 | Admitting: Rehabilitative and Restorative Service Providers"

## 2022-01-27 ENCOUNTER — Telehealth: Payer: Self-pay | Admitting: Radiology

## 2022-01-27 ENCOUNTER — Other Ambulatory Visit: Payer: Self-pay

## 2022-01-27 ENCOUNTER — Inpatient Hospital Stay: Payer: 59

## 2022-01-27 DIAGNOSIS — M545 Low back pain, unspecified: Secondary | ICD-10-CM

## 2022-01-27 DIAGNOSIS — R200 Anesthesia of skin: Secondary | ICD-10-CM

## 2022-01-27 DIAGNOSIS — M5126 Other intervertebral disc displacement, lumbar region: Secondary | ICD-10-CM | POA: Diagnosis not present

## 2022-01-27 MED ORDER — DIAZEPAM 5 MG PO TABS
10.0000 mg | ORAL_TABLET | Freq: Once | ORAL | Status: AC
  Administered 2022-01-27: 10 mg via ORAL

## 2022-01-27 MED ORDER — IOPAMIDOL (ISOVUE-M 200) INJECTION 41%
18.0000 mL | Freq: Once | INTRAMUSCULAR | Status: AC
  Administered 2022-01-27: 18 mL via INTRATHECAL

## 2022-01-27 MED ORDER — ONDANSETRON HCL 4 MG/2ML IJ SOLN: 4.0000 mg | Freq: Once | INTRAMUSCULAR | Status: DC | PRN

## 2022-01-27 MED ORDER — MEPERIDINE HCL 50 MG/ML IJ SOLN: 50.0000 mg | Freq: Once | INTRAMUSCULAR | Status: DC | PRN

## 2022-01-27 NOTE — Telephone Encounter (Signed)
Referral has been placed. 

## 2022-01-27 NOTE — Discharge Instructions (Signed)

## 2022-01-27 NOTE — Telephone Encounter (Signed)
Received STAT call report from [facility] at [facility]. Request Dr. Marlou Sa review CT/Myelogram results.

## 2022-01-27 NOTE — Addendum Note (Signed)
Addended byLaurann Montana on: 01/27/2022 04:50 PM   Modules accepted: Orders

## 2022-01-27 NOTE — Telephone Encounter (Signed)
I called.  Patient is having some progressive symptoms particularly involving the right leg going up into the buttock region.  Recommend neurosurgical evaluation first available hopefully either tomorrow or Friday for progressive neurologic symptoms possible tumor.  Thanks if you can refer her to whoever over [place] [facility] has the first available appointment that would be great.  Thanks

## 2022-01-28 ENCOUNTER — Other Ambulatory Visit: Payer: Self-pay | Admitting: *Deleted

## 2022-01-28 DIAGNOSIS — D693 Immune thrombocytopenic purpura: Secondary | ICD-10-CM

## 2022-01-28 NOTE — Progress Notes (Signed)
LDH added to labs for 01/29/2022

## 2022-01-29 ENCOUNTER — Other Ambulatory Visit: Payer: Self-pay

## 2022-01-29 ENCOUNTER — Inpatient Hospital Stay: Payer: 59

## 2022-01-29 ENCOUNTER — Other Ambulatory Visit: Payer: Self-pay | Admitting: Surgical

## 2022-01-29 DIAGNOSIS — Z7901 Long term (current) use of anticoagulants: Secondary | ICD-10-CM | POA: Diagnosis not present

## 2022-01-29 DIAGNOSIS — E538 Deficiency of other specified B group vitamins: Secondary | ICD-10-CM | POA: Diagnosis not present

## 2022-01-29 DIAGNOSIS — D693 Immune thrombocytopenic purpura: Secondary | ICD-10-CM

## 2022-01-29 DIAGNOSIS — Z86718 Personal history of other venous thrombosis and embolism: Secondary | ICD-10-CM | POA: Diagnosis not present

## 2022-01-29 DIAGNOSIS — Z85828 Personal history of other malignant neoplasm of skin: Secondary | ICD-10-CM | POA: Diagnosis not present

## 2022-01-29 DIAGNOSIS — Z8041 Family history of malignant neoplasm of ovary: Secondary | ICD-10-CM | POA: Diagnosis not present

## 2022-01-29 DIAGNOSIS — R748 Abnormal levels of other serum enzymes: Secondary | ICD-10-CM | POA: Diagnosis not present

## 2022-01-29 DIAGNOSIS — G610 Guillain-Barre syndrome: Secondary | ICD-10-CM | POA: Diagnosis not present

## 2022-01-29 LAB — CMP (CANCER CENTER ONLY)
ALT: 60 U/L — ABNORMAL HIGH (ref 0–44)
AST: 19 U/L (ref 15–41)
Albumin: 4.4 g/dL (ref 3.5–5.0)
Alkaline Phosphatase: 51 U/L (ref 38–126)
Anion gap: 7 (ref 5–15)
BUN: 14 mg/dL (ref 6–20)
CO2: 29 mmol/L (ref 22–32)
Calcium: 9.7 mg/dL (ref 8.9–10.3)
Chloride: 103 mmol/L (ref 98–111)
Creatinine: 0.68 mg/dL (ref 0.44–1.00)
GFR, Estimated: >60 mL/min (ref >60)
Glucose, Bld: 154 mg/dL — ABNORMAL HIGH (ref 70–99)
Potassium: 4.1 mmol/L (ref 3.5–5.1)
Sodium: 139 mmol/L (ref 135–145)
Total Bilirubin: 0.5 mg/dL (ref 0.3–1.2)
Total Protein: 7.1 g/dL (ref 6.5–8.1)

## 2022-01-29 LAB — LACTATE DEHYDROGENASE: LDH: 121 U/L (ref 98–192)

## 2022-02-01 ENCOUNTER — Other Ambulatory Visit (HOSPITAL_COMMUNITY): Payer: Self-pay

## 2022-02-01 ENCOUNTER — Ambulatory Visit: Payer: 59 | Admitting: Rehabilitative and Restorative Service Providers"

## 2022-02-01 ENCOUNTER — Other Ambulatory Visit: Payer: Self-pay

## 2022-02-01 ENCOUNTER — Telehealth: Payer: Self-pay

## 2022-02-01 ENCOUNTER — Encounter: Payer: Self-pay | Admitting: Rehabilitative and Restorative Service Providers"

## 2022-02-01 DIAGNOSIS — M25661 Stiffness of right knee, not elsewhere classified: Secondary | ICD-10-CM

## 2022-02-01 DIAGNOSIS — M6281 Muscle weakness (generalized): Secondary | ICD-10-CM | POA: Diagnosis not present

## 2022-02-01 DIAGNOSIS — R600 Localized edema: Secondary | ICD-10-CM | POA: Diagnosis not present

## 2022-02-01 DIAGNOSIS — G8929 Other chronic pain: Secondary | ICD-10-CM

## 2022-02-01 DIAGNOSIS — R262 Difficulty in walking, not elsewhere classified: Secondary | ICD-10-CM | POA: Diagnosis not present

## 2022-02-01 DIAGNOSIS — M25561 Pain in right knee: Secondary | ICD-10-CM

## 2022-02-01 MED ORDER — METHOCARBAMOL 500 MG PO TABS
500.0000 mg | ORAL_TABLET | Freq: Two times a day (BID) | ORAL | 0 refills | Status: DC | PRN
  Filled 2022-02-01: qty 40, 20d supply, fill #0

## 2022-02-01 NOTE — Therapy (Signed)
[facility] [place], [place], [address] Phone: [phone]   Fax:  [phone]  Physical Therapy Treatment  Patient Details  Name: Christine Brady MRN: [id number] Date of Birth: January 23, 1961 Referring Provider (PT): Marcene Duos   Encounter Date: 02/01/2022   PT End of Session - 02/01/22 1546     Visit Number 9    Number of Visits 13    Date for PT Re-Evaluation 02/12/22    Authorization Type [employer]    Progress Note Due on Visit 10    PT Start Time 1510    PT Stop Time 1550    PT Time Calculation (min) 40 min    Activity Tolerance Patient tolerated treatment well    Behavior During Therapy [facility] for tasks assessed/performed             Past Medical History:  Diagnosis Date   Abdominal pain    Arthritis    difficulty walking   Atypical chest pain 11/11/2016   Bowel obstruction ([place])    Cholesteatoma of right ear    Chronic headaches    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[place]) 11/11/2016   Cyst    back   Hearing aid worn    History of ITP    Hyperlipidemia 11/11/2016   Large ovary 07/09/2019   PONV (postoperative nausea and vomiting)    Reflux     Past Surgical History:  Procedure Laterality Date   CHOLECYSTECTOMY  2010   ESOPHAGOGASTRODUODENOSCOPY (EGD) WITH PROPOFOL N/A 06/12/2019   Procedure: ESOPHAGOGASTRODUODENOSCOPY (EGD) WITH PROPOFOL;  Surgeon: Carol Ada, MD;  Location: [facility];  Service: Endoscopy;  Laterality: N/A;   EXTERNAL EAR SURGERY  2009/2010   EXTERNAL EAR SURGERY  2010   right ear   KNEE ARTHROSCOPY     right   KNEE ARTHROSCOPY WITH MEDIAL PATELLAR FEMORAL LIGAMENT RECONSTRUCTION Right 10/05/2021   Procedure: right knee arthroscoy, debridement, lateral menisectomy, medial patellar femoral ligament reconstruction;  Surgeon: Meredith Pel, MD;  Location: [place];  Service: Orthopedics;  Laterality: Right;   KNEE LIGAMENT RECONSTRUCTION     left    MASTOIDECTOMY Right 01/19/2017   Procedure: RIGHT MODIFIED RADICAL MASTOIDECTOMY;  Surgeon: Vicie Mutters, MD;  Location: [place];  Service: ENT;  Laterality: Right;   UPPER ESOPHAGEAL ENDOSCOPIC ULTRASOUND (EUS) N/A 06/12/2019   Procedure: UPPER ESOPHAGEAL ENDOSCOPIC ULTRASOUND (EUS);  Surgeon: Carol Ada, MD;  Location: [facility];  Service: Endoscopy;  Laterality: N/A;   WISDOM TOOTH EXTRACTION      There were no vitals filed for this visit.   Subjective Assessment - 02/01/22 1511     Subjective Pt. indicated using cane due to feet being off balance.   Pt. indicated seeing neurosurgeon and she indicated that nothing gain from seeing them.  Has scheduled visit c neurologist.  Pt. indicated the feet complaints keep her from standing and doing at much now.  Pt. indicated not much pain in knee during the day, still noted at night.    Pertinent History : rt knee arthroscopy with lateral menisectomy, 10/05/2021  Hearing aid, reflux, arthritis, bowl obstruction, h/o baker's cyst, hyperlipidemia    Limitations Other (comment);Writing;House hold activities;Lifting;Standing    Patient Stated Goals Get back to working in my yard and stop hurting    Currently in Pain? No/denies    Pain Score 0-No pain   4-5/10 at night   Pain Location Knee    Pain Orientation Right    Pain Descriptors /  Indicators Aching;Tightness;Sore    Pain Type Chronic pain    Pain Onset More than a month ago    Pain Frequency Intermittent    Aggravating Factors  nighttime pains    Pain Relieving Factors medicine                [facility] PT Assessment - 02/01/22 0001       Assessment   Medical Diagnosis M25.561 right knee pain    Referring Provider (PT) Marcene Duos    Onset Date/Surgical Date 10/05/21    Hand Dominance Right      Strength   Right Knee Extension 4/5   27, 28                          [facility] Adult PT Treatment/Exercise - 02/01/22 0001       Knee/Hip  Exercises: Aerobic   Recumbent Bike seat 3 Lvl 3 10 mins      Knee/Hip Exercises: Machines for Strengthening   Cybex Knee Extension Double leg up, single leg lowering Rt 2 x 10 5 lbs (slot 3 setup)    Cybex Knee Flexion Rt leg only 10 lbs 2 x 10      Knee/Hip Exercises: Seated   Other Seated Knee/Hip Exercises seated isometric alternating extension/flexion Rt knee 10 sec hold x 9 each in mid range    Other Seated Knee/Hip Exercises SLR x 15 performed bilateral    Sit to Sand 10 reps;without UE support   18 inch chair, slow sit down                      PT Short Term Goals - 01/20/22 1549       PT SHORT TERM GOAL #1   Title Pt will be independent in her initial HEP    Time 3    Period Weeks    Status Achieved    Target Date 01/15/22      PT SHORT TERM GOAL #2   Title Pt will improve her 5 time sit to stand to </= 12 seconds with no UE support.    Time 3    Period Weeks    Status Partially Met    Target Date 01/15/22               PT Long Term Goals - 01/20/22 1549       PT LONG TERM GOAL #1   Title Pt will be independent in her advanced HEP.    Time 6    Period Weeks    Status On-going    Target Date 02/12/22      PT LONG TERM GOAL #2   Title Pt will improve her FOTO score from 37% tp 60%    Time 6    Period Weeks    Status On-going    Target Date 02/12/22      PT LONG TERM GOAL #3   Title Pt will improve her right knee flexion to >/= 125 degrees in order to improve functional mobility.    Time 6    Period Weeks    Status On-going    Target Date 02/12/22      PT LONG TERM GOAL #4   Title Pt will be able to improve her right knee extension to 5/5.    Time 6    Period Weeks    Status On-going    Target Date 02/12/22  PT LONG TERM GOAL #5   Title Pt will be able to work full shift with pain </= 2/10 in her right knee.    Time 6    Period Weeks    Status On-going    Target Date 02/12/22                   Plan -  02/01/22 1535     Clinical Impression Statement Spent time on adapting home exercise program to continue focus on gaining Rt quad extension strength and balance.  Pt. continued to show Rt knee extension strength deficits that impair functional gait, stairs and transfers at this time.    Personal Factors and Comorbidities Comorbidity 3+    Comorbidities rt knee arthroscopy with lateral menisectomy, 10/05/2021  Hearing aid, reflux, arthritis, bowl obstruction, h/o baker's cyst, hyperlipidemia    Examination-Activity Limitations Other;Squat;Stairs;Stand;Sleep    Examination-Participation Restrictions Community Activity;Other;Occupation;Yard Work    Stability/Clinical Decision Making Stable/Uncomplicated    Rehab Potential Good    PT Frequency 2x / week    PT Duration 6 weeks    PT Treatment/Interventions ADLs/Self Care Home Management;Cryotherapy;Gait training;Stair training;Functional mobility training;Therapeutic activities;Balance training;Therapeutic exercise;Patient/family education;Manual techniques;Dry needling;Taping;Vasopneumatic Device;Passive range of motion;Joint Manipulations;Neuromuscular re-education    PT Next Visit Plan Continue quad strengthening open chain, transfer.  Isometric hip extension/hamstring continued.    PT Home Exercise Plan Access Code: [email or web address]  URL: [email or web address]    Consulted and Agree with Plan of Care Patient             Patient will benefit from skilled therapeutic intervention in order to improve the following deficits and impairments:  Pain, Increased edema, Decreased strength, Impaired flexibility, Decreased activity tolerance, Difficulty walking, Decreased range of motion  Visit Diagnosis: Chronic pain of right knee  Muscle weakness (generalized)  Difficulty in walking, not elsewhere classified  Stiffness of right knee, not elsewhere classified  Localized edema     Problem List Patient Active Problem List   Diagnosis  Date Noted   Patellar instability of right knee    Complex tear of lateral meniscus of right knee    Loose body in knee, right knee    Constipation 06/05/2021   Diverticular disease of colon 06/05/2021   Family history of other diseases of the digestive system 06/05/2021   Nausea 06/05/2021   Otalgia, left 06/05/2021   Right upper quadrant pain 06/05/2021   Acute lateral meniscus tear of right knee 06/05/2021   Patellar dislocation, right, sequela 06/05/2021   Diplacusis of left ear 10/02/2020   History of right mastoidectomy 04/10/2020   Postop check 02/29/2020   Bowel obstruction ([place]) 09/28/2019   Idiopathic thrombocytopenia ([place]) 09/28/2019   Eustachian tube dysfunction, bilateral 09/28/2019   Tinnitus aurium, bilateral 09/28/2019   Tympanic membrane central perforation, 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   Unspecified cholesteatoma, left ear 09/28/2019   IBS (irritable bowel syndrome) 07/09/2019   Headache 07/09/2019   GERD (gastroesophageal reflux disease) 07/09/2019   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

## 2022-02-01 NOTE — Telephone Encounter (Signed)
Pt verbalized understanding.

## 2022-02-01 NOTE — Telephone Encounter (Signed)
-----   Message from Christine Pier, MD sent at 01/29/2022  4:30 PM EST ----- Please call patient, liver enzymes are better, follow-up as scheduled

## 2022-02-03 ENCOUNTER — Other Ambulatory Visit: Payer: Self-pay

## 2022-02-03 ENCOUNTER — Encounter: Payer: Self-pay | Admitting: Rehabilitative and Restorative Service Providers"

## 2022-02-03 ENCOUNTER — Ambulatory Visit: Payer: 59 | Admitting: Rehabilitative and Restorative Service Providers"

## 2022-02-03 DIAGNOSIS — M25561 Pain in right knee: Secondary | ICD-10-CM

## 2022-02-03 DIAGNOSIS — M25661 Stiffness of right knee, not elsewhere classified: Secondary | ICD-10-CM

## 2022-02-03 DIAGNOSIS — M6281 Muscle weakness (generalized): Secondary | ICD-10-CM

## 2022-02-03 DIAGNOSIS — G8929 Other chronic pain: Secondary | ICD-10-CM | POA: Diagnosis not present

## 2022-02-03 DIAGNOSIS — R600 Localized edema: Secondary | ICD-10-CM | POA: Diagnosis not present

## 2022-02-03 DIAGNOSIS — R262 Difficulty in walking, not elsewhere classified: Secondary | ICD-10-CM

## 2022-02-03 NOTE — Therapy (Addendum)
[facility] [address] [place], [place], [address] Phone: [phone]   Fax:  [phone]  Physical Therapy Treatment Discharge / Transferred to NeuroRehab  Patient Details  Name: Christine Brady MRN: [id number] Date of Birth: 06-29-1961 Referring Provider (PT): Marcene Duos   Encounter Date: 02/03/2022   PT End of Session - 02/03/22 1508     Visit Number 10    Number of Visits 13    Date for PT Re-Evaluation 02/12/22    Authorization Type [employer] [employer]    Progress Note Due on Visit 13   changed to reflect POC   PT Start Time 1506    PT Stop Time 1556    PT Time Calculation (min) 50 min    Activity Tolerance Patient tolerated treatment well    Behavior During Therapy [facility] for tasks assessed/performed             Past Medical History:  Diagnosis Date   Abdominal pain    Arthritis    difficulty walking   Atypical chest pain 11/11/2016   Bowel obstruction ([place])    Cholesteatoma of right ear    Chronic headaches    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[place]) 11/11/2016   Cyst    back   Hearing aid worn    History of ITP    Hyperlipidemia 11/11/2016   Large ovary 07/09/2019   PONV (postoperative nausea and vomiting)    Reflux     Past Surgical History:  Procedure Laterality Date   CHOLECYSTECTOMY  2010   ESOPHAGOGASTRODUODENOSCOPY (EGD) WITH PROPOFOL N/A 06/12/2019   Procedure: ESOPHAGOGASTRODUODENOSCOPY (EGD) WITH PROPOFOL;  Surgeon: Carol Ada, MD;  Location: [facility];  Service: Endoscopy;  Laterality: N/A;   EXTERNAL EAR SURGERY  2009/2010   EXTERNAL EAR SURGERY  2010   right ear   KNEE ARTHROSCOPY     right   KNEE ARTHROSCOPY WITH MEDIAL PATELLAR FEMORAL LIGAMENT RECONSTRUCTION Right 10/05/2021   Procedure: right knee arthroscoy, debridement, lateral menisectomy, medial patellar femoral ligament reconstruction;  Surgeon: Meredith Pel, MD;  Location: [facility];  Service: Orthopedics;   Laterality: Right;   KNEE LIGAMENT RECONSTRUCTION     left   MASTOIDECTOMY Right 01/19/2017   Procedure: RIGHT MODIFIED RADICAL MASTOIDECTOMY;  Surgeon: Vicie Mutters, MD;  Location: [place];  Service: ENT;  Laterality: Right;   UPPER ESOPHAGEAL ENDOSCOPIC ULTRASOUND (EUS) N/A 06/12/2019   Procedure: UPPER ESOPHAGEAL ENDOSCOPIC ULTRASOUND (EUS);  Surgeon: Carol Ada, MD;  Location: [facility];  Service: Endoscopy;  Laterality: N/A;   WISDOM TOOTH EXTRACTION      There were no vitals filed for this visit.                      [facility] Adult PT Treatment/Exercise - 02/03/22 0001       Neuro Re-ed    Neuro Re-ed Details  tandem stance 1 min x 1 bilateral, fitter wobble board fwd/back 20x each way,      Knee/Hip Exercises: Aerobic   Recumbent Bike Seat 3 lvl 3 10 mins      Knee/Hip Exercises: Machines for Strengthening   Cybex Knee Extension Double leg up, single leg lowering Rt 2 x 10 5 lbs (slot 3 setup)    Cybex Knee Flexion Rt leg only 10 lbs 2 x 10      Knee/Hip Exercises: Seated   Other Seated Knee/Hip Exercises SLR x 15 performed bilateral (slow emphasis)      Knee/Hip  Exercises: Prone   Other Prone Exercises prone TKE 5 sec hold x 15 Rt      Vasopneumatic   Number Minutes Vasopneumatic  10 minutes    Vasopnuematic Location  Knee    Vasopneumatic Pressure Medium    Vasopneumatic Temperature  34                       PT Short Term Goals - 01/20/22 1549       PT SHORT TERM GOAL #1   Title Pt will be independent in her initial HEP    Time 3    Period Weeks    Status Achieved    Target Date 01/15/22      PT SHORT TERM GOAL #2   Title Pt will improve her 5 time sit to stand to </= 12 seconds with no UE support.    Time 3    Period Weeks    Status Partially Met    Target Date 01/15/22               PT Long Term Goals - 01/20/22 1549       PT LONG TERM GOAL #1   Title Pt will be independent in her advanced  HEP.    Time 6    Period Weeks    Status On-going    Target Date 02/12/22      PT LONG TERM GOAL #2   Title Pt will improve her FOTO score from 37% tp 60%    Time 6    Period Weeks    Status On-going    Target Date 02/12/22      PT LONG TERM GOAL #3   Title Pt will improve her right knee flexion to >/= 125 degrees in order to improve functional mobility.    Time 6    Period Weeks    Status On-going    Target Date 02/12/22      PT LONG TERM GOAL #4   Title Pt will be able to improve her right knee extension to 5/5.    Time 6    Period Weeks    Status On-going    Target Date 02/12/22      PT LONG TERM GOAL #5   Title Pt will be able to work full shift with pain </= 2/10 in her right knee.    Time 6    Period Weeks    Status On-going    Target Date 02/12/22                   Plan - 02/03/22 1540     Clinical Impression Statement Pt. reported and demonstrated more noted muscle fatigue and soreness post exercise indicating good positive reaction to shift for quad strengthening in various positionings.  Continued benefit for skilled PT services indicated at this time.    Personal Factors and Comorbidities Comorbidity 3+    Comorbidities rt knee arthroscopy with lateral menisectomy, 10/05/2021  Hearing aid, reflux, arthritis, bowl obstruction, h/o baker's cyst, hyperlipidemia    Examination-Activity Limitations Other;Squat;Stairs;Stand;Sleep    Examination-Participation Restrictions Community Activity;Other;Occupation;Yard Work    Stability/Clinical Decision Making Stable/Uncomplicated    Rehab Potential Good    PT Frequency 2x / week    PT Duration 6 weeks    PT Treatment/Interventions ADLs/Self Care Home Management;Cryotherapy;Gait training;Stair training;Functional mobility training;Therapeutic activities;Balance training;Therapeutic exercise;Patient/family education;Manual techniques;Dry needling;Taping;Vasopneumatic Device;Passive range of motion;Joint  Manipulations;Neuromuscular re-education    PT Next Visit Plan Continue  quad strengthening open chain, transfer strengthening to replace leg press    PT Home Exercise Plan Access Code: [email or web address]  URL: [email or web address]    Consulted and Agree with Plan of Care Patient             Patient will benefit from skilled therapeutic intervention in order to improve the following deficits and impairments:  Pain, Increased edema, Decreased strength, Impaired flexibility, Decreased activity tolerance, Difficulty walking, Decreased range of motion  Visit Diagnosis: Chronic pain of right knee  Muscle weakness (generalized)  Difficulty in walking, not elsewhere classified  Stiffness of right knee, not elsewhere classified  Localized edema     Problem List Patient Active Problem List   Diagnosis Date Noted   Patellar instability of right knee    Complex tear of lateral meniscus of right knee    Loose body in knee, right knee    Constipation 06/05/2021   Diverticular disease of colon 06/05/2021   Family history of other diseases of the digestive system 06/05/2021   Nausea 06/05/2021   Otalgia, left 06/05/2021   Right upper quadrant pain 06/05/2021   Acute lateral meniscus tear of right knee 06/05/2021   Patellar dislocation, right, sequela 06/05/2021   Diplacusis of left ear 10/02/2020   History of right mastoidectomy 04/10/2020   Postop check 02/29/2020   Bowel obstruction ([place]) 09/28/2019   Idiopathic thrombocytopenia ([place]) 09/28/2019   Eustachian tube dysfunction, bilateral 09/28/2019   Tinnitus aurium, bilateral 09/28/2019   Tympanic membrane central perforation, 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   Unspecified cholesteatoma, left ear 09/28/2019   IBS (irritable bowel syndrome) 07/09/2019   Headache 07/09/2019   GERD (gastroesophageal reflux disease) 07/09/2019   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 [place], [place], [address] Phone: [phone]   Fax:  [phone]  Name: Christine Brady MRN: [id number] Date of Birth: 04-04-1961  PHYSICAL THERAPY DISCHARGE SUMMARY  Visits from Start of Care: 10  Current functional level related to goals / functional outcomes: See above   Remaining deficits: See above   Education / Equipment: HEP   Patient agrees to discharge. Patient goals were not met. Patient is being discharged due to  pt was transferred to outpatient NeuroRehab due to new dx of GBS.

## 2022-02-04 ENCOUNTER — Ambulatory Visit (INDEPENDENT_AMBULATORY_CARE_PROVIDER_SITE_OTHER): Payer: 59 | Admitting: Neurology

## 2022-02-04 ENCOUNTER — Encounter: Payer: Self-pay | Admitting: Neurology

## 2022-02-04 ENCOUNTER — Other Ambulatory Visit (HOSPITAL_COMMUNITY): Payer: Self-pay

## 2022-02-04 ENCOUNTER — Other Ambulatory Visit: Payer: Self-pay | Admitting: Neurology

## 2022-02-04 ENCOUNTER — Ambulatory Visit
Admission: RE | Admit: 2022-02-04 | Discharge: 2022-02-04 | Disposition: A | Discharge status: Home / Self Care | Payer: 59 | Source: Ambulatory Visit | Attending: Neurology | Admitting: Neurology

## 2022-02-04 ENCOUNTER — Other Ambulatory Visit: Payer: Self-pay | Admitting: *Deleted

## 2022-02-04 VITALS — BP 123/86 | HR 71 | Ht 62.0 in | Wt 140.0 lb

## 2022-02-04 DIAGNOSIS — R270 Ataxia, unspecified: Secondary | ICD-10-CM

## 2022-02-04 DIAGNOSIS — D693 Immune thrombocytopenic purpura: Secondary | ICD-10-CM | POA: Diagnosis not present

## 2022-02-04 DIAGNOSIS — R29898 Other symptoms and signs involving the musculoskeletal system: Secondary | ICD-10-CM

## 2022-02-04 DIAGNOSIS — G610 Guillain-Barre syndrome: Secondary | ICD-10-CM

## 2022-02-04 DIAGNOSIS — Z79899 Other long term (current) drug therapy: Secondary | ICD-10-CM | POA: Diagnosis not present

## 2022-02-04 DIAGNOSIS — G6181 Chronic inflammatory demyelinating polyneuritis: Secondary | ICD-10-CM

## 2022-02-04 DIAGNOSIS — W19XXXA Unspecified fall, initial encounter: Secondary | ICD-10-CM | POA: Diagnosis not present

## 2022-02-04 DIAGNOSIS — Z8041 Family history of malignant neoplasm of ovary: Secondary | ICD-10-CM | POA: Diagnosis not present

## 2022-02-04 DIAGNOSIS — R748 Abnormal levels of other serum enzymes: Secondary | ICD-10-CM | POA: Diagnosis not present

## 2022-02-04 DIAGNOSIS — E538 Deficiency of other specified B group vitamins: Secondary | ICD-10-CM | POA: Diagnosis not present

## 2022-02-04 DIAGNOSIS — Z7901 Long term (current) use of anticoagulants: Secondary | ICD-10-CM | POA: Diagnosis not present

## 2022-02-04 DIAGNOSIS — Z86718 Personal history of other venous thrombosis and embolism: Secondary | ICD-10-CM | POA: Diagnosis not present

## 2022-02-04 DIAGNOSIS — R836 Abnormal cytological findings in cerebrospinal fluid: Secondary | ICD-10-CM | POA: Diagnosis not present

## 2022-02-04 DIAGNOSIS — Z85828 Personal history of other malignant neoplasm of skin: Secondary | ICD-10-CM | POA: Diagnosis not present

## 2022-02-04 MED ORDER — DEXAMETHASONE 4 MG PO TABS
40.0000 mg | ORAL_TABLET | Freq: Every day | ORAL | 5 refills | Status: DC
  Filled 2022-02-04: qty 40, 4d supply, fill #0

## 2022-02-04 MED ORDER — DEXAMETHASONE 20 MG PO TABS
40.0000 mg | ORAL_TABLET | Freq: Every day | ORAL | 6 refills | Status: DC
  Filled 2022-02-04: qty 8, fill #0

## 2022-02-04 NOTE — Patient Instructions (Addendum)
Lumbar Puncture For patients treated with pulse-dose glucocorticoids, we typically start with an initial dose of IV methylprednisolone (1000 mg/day) for three days, followed by 1000 mg one day a week for four week. Oral pulsed steroids may be an efficacious alternative to an intravenous pulsed regimen. Options include dexamethasone 40 mg for four consecutive days once a month or prednisone 600 mg once a week.  We will get IVIG approved for you that is once monthly treatment. Prior to that will need lumbar puncture and emg/ncs 12:15 lumbar puncture Come back for 1g Prednisone Start methypred tomorow  Guillain-Barre Syndrome Guillain-Barr syndrome (GBS) is a rare disorder in which the body's disease-fighting system (immune system) attacks the nerves. This causes numbness and tingling. It can eventually develop into a serious condition and, in some cases, cause paralysis. GBS does not spread from person to person (is not contagious). Most people with GBS recover within a few months. However, some people may have muscle weakness that becomes permanent. What are the causes? The exact cause of GBS is not known. In most cases, GBS develops a few days or weeks after you have an infection, such as: A stomach infection caused by Campylobacter bacteria. This type of infection usually causes diarrhea. An infection of the nose, throat, and upper airways (respiratory infection), such as a cold or the flu. A viral infection. Less commonly, GBS may be triggered by: Surgery. A vaccine. What are the signs or symptoms? The most common first symptoms are tingling, numbness, and weakness in the lower legs. Other symptoms may develop over hours, days, or weeks. These may include: Muscle weakness that spreads from the legs to the abdomen and then the arms. Aching or burning pain of the arms and legs. Inability to move the arms, legs, or both (paralysis). Weakness of muscles in the face. Trouble chewing or  swallowing. Slurred speech. Difficulty breathing. How is this diagnosed? This condition may be diagnosed based on: Your symptoms and medical history. Your health care provider will ask about any recent infections you have had. A physical exam. A test that measures how quickly your nerves carry signals to your muscles (nerve conduction velocity test) and a test that measures how well the nerves and muscles communicate (electromyogram). Testing a sample of fluid that surrounds the spinal cord (lumbar puncture). How is this treated? If your symptoms are mild, you may be given medicines to control your symptoms. If your condition becomes severe, you may need to be treated at a hospital. At the hospital, treatment may include: Medicines. These help to improve numbness or tingling sensations in your arms and legs (paresthesia) caused by irritation of the nerves. Plasma exchange (plasmapheresis). This is a procedure in which the antibodies that are attacking your nerves are removed from your blood. Getting proteins (immunoglobulins or antibodies) that slow down the immune system's attack on your nerves. These may be given through an IV line inserted into one of your veins. Oxygen and breathing assistance. Treatment may also include physical therapy to help strengthen your muscles. After being treated in the hospital, you may be transferred to a rehabilitation center to get intensive physical therapy. Once your strength improves, you may continue to get physical therapy at home. Additional physical therapy may be needed for many months. Follow these instructions at home:  If physical therapy was prescribed, do exercises as told by your health care provider. Make sure you have the support you need. Someone may need to help you bathe, dress, and prepare meals  until you regain your strength. Rest as needed. Return to your normal activities as told by your health care provider. Ask your health care  provider what activities are safe for you. Take over-the-counter and prescription medicines only as told by your health care provider. Keep all follow-up visits. This is important. Where to find more information GBS/CIDP Foundation International: www.gbs-cidp.org Contact a health care provider if you: Have new symptoms or your symptoms get worse. Do not feel like you are getting better or regaining strength. Do not feel safe or supported at home. Are having trouble coping with your recovery. Get help right away if you: Have trouble breathing or chest pain. Have trouble swallowing. Choke after eating or drinking. Develop a fever. Cannot move. Faint or feel dizzy. Have pain, swelling, warmth, or redness in an arm or leg. These symptoms may be an emergency. Get help right away. Call 911. Do not wait to see if the symptoms will go away. Do not drive yourself to the hospital. Summary Guillain-Barr syndrome (GBS) is a rare disorder in which the body's immune system attacks the nerves. GBS often requires treatment at a hospital. Treatment in the hospital may include medicines, plasmapheresis, getting proteins that slow down the immune system's attack on your nerves, or oxygen and breathing assistance. Months of physical therapy may be needed until you regain your strength. This information is not intended to replace advice given to you by your health care provider. Make sure you discuss any questions you have with your health care provider. Document Revised: 06/23/2021 Document Reviewed: 06/23/2021 Elsevier Patient Education  2022 [employer].  Chronic Inflammatory Demyelinating Polyneuropathy Chronic inflammatory demyelinating polyneuropathy (CIDP) is a condition in which the nerves in an extremity, such as a hand or foot, do not work as they should. Normally, nerves are surrounded and protected by a fatty covering called the myelin sheath. The myelin sheath helps the nerves conduct  electricity. Damage to the myelin can lead to a short in the system and failure of the nerves to work correctly. This condition happens when the body's defense system (immune system) damages the myelin sheath. This is called an autoimmune condition. CIDP is associated with autoimmune conditions or other conditions such as diabetes, human immunodeficiency virus (HIV), hepatitis B or C infection, Waldenstrm macroglobulinemia, multiple myeloma, and osteosclerotic myeloma. What are the causes? This condition occurs when the body's immune system accidentally attacks the myelin sheath. What are the signs or symptoms? Symptoms of this condition include: Numbness or tingling of the hands and feet. Weakness, especially in the hips, shoulders, hands, and feet. Problems walking. Weak or absent reflexes. How is this diagnosed? This condition may be diagnosed with: A physical exam. A spinal tap (lumbar puncture) to check for inflammation in the spinal fluid (cerebrospinal fluid). Tests that show how well the nerves are working, such as: Nerve conduction studies. An electromyogram. Blood tests to look for autoimmune conditions, blood cancers, and infections. How is this treated? This condition may be treated with: Immunoglobulin therapy. This treatment helps prevent your immune system from attacking your myelin sheath. Medicines that suppress the immune system, such as steroids. Blood plasma exchange (plasmapheresis). This can be done to remove harmful immune system elements from your blood. Physical therapy. This may help improve your strength and balance. Braces. These may be used to help support a weakened arm or leg. Follow these instructions at home: If you have a removable brace: Wear the brace as told by your health care provider. Remove it  only as told by your health care provider. Check the skin around the brace every day. Tell your health care provider about any concerns. Loosen the brace  if your fingers or toes tingle, become numb, or turn cold and blue. Keep the brace clean. If the brace is not waterproof: Do not let it get wet. Cover it with a watertight covering when you take a bath or shower. General instructions Take over-the-counter and prescription medicines only as told by your health care provider. Return to your normal activities as told by your health care provider. Ask your health care provider what activities are safe for you. Do exercises as told by your health care provider. Keep all follow-up visits. This is important. Where to find more information National Organization for Rare Disorders: www.rarediseases.org GBS/CIDP Foundation International: www.gbs-cidp.org Contact a health care provider if: You have new symptoms. Your symptoms get worse. Get help right away if: You have an allergic reaction to any of the medicines you are taking. You develop new weakness, difficulty swallowing, or the inability to walk. You have shortness of breath. These symptoms may be an emergency. Get help right away. Call 911. Do not wait to see if the symptoms will go away. Do not drive yourself to the hospital. Summary CIDP is a disease that affects the myelin sheath around the nerves in the hands and feet. CIDP is associated with autoimmune conditions or other conditions, such as diabetes, HIV, hepatitis B or C infection, Waldenstrm macroglobulinemia, multiple myeloma, and osteosclerotic myeloma. This condition may be diagnosed with tests such as a spinal tap, nerve conduction studies, and an electromyogram. Treatment can include medicines, blood plasma exchange, physical therapy, and braces. This information is not intended to replace advice given to you by your health care provider. Make sure you discuss any questions you have with your health care provider. Document Revised: 06/23/2021 Document Reviewed: 06/23/2021 Elsevier Patient Education  [employer].  Prednisone Solution What is this medication? PREDNISONE (PRED ni sone) treats many conditions such as asthma, allergic reactions, arthritis, inflammatory bowel diseases, adrenal, and blood or bone marrow disorders. It works by decreasing inflammation, slowing down an overactive immune system, or replacing cortisol normally made in the body. Cortisol is a hormone that plays an important role in how the body responds to stress, illness, and injury. It belongs to a group of medications called steroids. This medicine may be used for other purposes; ask your health care provider or pharmacist if you have questions. What should I tell my care team before I take this medication? They need to know if you have any of these conditions: Cushing's syndrome Diabetes Glaucoma Heart disease High blood pressure Infection (especially a virus infection such as chickenpox, cold sores, or herpes) Kidney disease Liver disease Mental illness Myasthenia gravis Osteoporosis Seizures Stomach or intestine problems Thyroid disease An unusual or allergic reaction to prednisone, other corticosteroids, medications, foods, dyes, or preservatives Pregnant or trying to get pregnant Breast-feeding How should I use this medication? Take this medication by mouth. Use a specially marked spoon or dropper to measure your dose. Ask your pharmacist if you do not have one. Do not use a household spoon. Follow the directions on the prescription label. Take with food or milk to avoid stomach upset. If you are taking this medication once a day, take it in the morning. Do not take more medication than you are told to take. Do not suddenly stop taking your medication because you may develop a severe  reaction. Your care team will tell you how much medication to take. If your care team wants you to stop the medication, the dose may be slowly lowered over time to avoid any side effects. Talk to your care team about the use of this  medication in children. Special care may be needed. Overdosage: If you think you have taken too much of this medicine contact a poison control center or emergency room at once. NOTE: This medicine is only for you. Do not share this medicine with others. What if I miss a dose? If you miss a dose, take it as soon as you can. If it is almost time for your next dose, talk to your care team. You may need to miss a dose or take an extra dose. Do not take double or extra doses without advice. What may interact with this medication? Do not take this medication with any of the following: Metyrapone Mifepristone This medication may also interact with the following: Amphotericin B Aspirin and aspirin-like medications Barbiturates Certain medications for diabetes, like glipizide or glyburide Cholestyramine Cholinesterase inhibitors Cyclosporine Digoxin Diuretics Ephedrine Female hormones, like estrogens and birth control pills Isoniazid Ketoconazole NSAIDS, medications for pain and inflammation, like ibuprofen or naproxen Phenytoin Rifampin Toxoids Vaccines Warfarin This list may not describe all possible interactions. Give your health care provider a list of all the medicines, herbs, non-prescription drugs, or dietary supplements you use. Also tell them if you smoke, drink alcohol, or use illegal drugs. Some items may interact with your medicine. What should I watch for while using this medication? Visit your care team for regular checks on your progress. If you are taking this medication over a prolonged period, carry an identification card with your name and address, the type and dose of your medication, and your care team's name and address. This medication may increase your risk of getting an infection. Tell your care team if you are around anyone with measles or chickenpox, or if you develop sores or blisters that do not heal properly. If you are going to have surgery, tell your care team  that you have taken this medication within the last twelve months. Ask your care team about your diet. You may need to lower the amount of salt you eat. This medication may increase blood sugar. Ask your care team if changes in diet or medications are needed if you have diabetes. What side effects may I notice from receiving this medication? Side effects that you should report to your care team as soon as possible: Allergic reactions--skin rash, itching, hives, swelling of the face, lips, tongue, or throat Cushing syndrome--increased fat around the midsection, upper back, neck, or face, pink or purple stretch marks on the skin, thinning, fragile skin that easily bruises, unexpected hair growth High blood sugar (hyperglycemia)--increased thirst or amount of urine, unusual weakness or fatigue, blurry vision Increase in blood pressure Infection--fever, chills, cough, sore throat, wounds that don't heal, pain or trouble when passing urine, general feeling of discomfort or being unwell Low adrenal gland function--nausea, vomiting, loss of appetite, unusual weakness or fatigue, dizziness Mood and behavior changes--anxiety, nervousness, confusion, hallucinations, irritability, hostility, thoughts of suicide or self-harm, worsening mood, feelings of depression Stomach bleeding--bloody or black, tar-like stools, vomiting blood or brown material that looks like coffee grounds Swelling of the ankles, hands, or feet Side effects that usually do not require medical attention (report to your care team if they continue or are bothersome): Acne General discomfort and fatigue Headache Increase  in appetite Nausea Trouble sleeping Weight gain This list may not describe all possible side effects. Call your doctor for medical advice about side effects. You may report side effects to FDA at 1-800-FDA-1088. Where should I keep my medication? Keep out of the reach of children in a container that small children  cannot open. Store at room temperature between 15 and 30 degrees C (59 and 86 degrees F). Protect from light. Keep container tightly closed. Throw away any unused medication after the expiration date. NOTE: This sheet is a summary. It may not cover all possible information. If you have questions about this medicine, talk to your doctor, pharmacist, or health care provider.  2022 Elsevier/Gold Standard (2021-02-27 00:00:00) Methylprednisolone Tablets What is this medication? METHYLPREDNISOLONE (meth ill pred NISS oh lone) treats many conditions such as asthma, allergic reactions, arthritis, inflammatory bowel diseases, adrenal, and blood or bone marrow disorders. It works by decreasing inflammation, slowing down an overactive immune system, or replacing cortisol normally made in the body. Cortisol is a hormone that plays an important role in how the body responds to stress, illness, and injury. It belongs to a group of medications called steroids. This medicine may be used for other purposes; ask your health care provider or pharmacist if you have questions. COMMON BRAND NAME(S): Medrol, Medrol Dosepak What should I tell my care team before I take this medication? They need to know if you have any of these conditions: Cushing's syndrome Eye disease, vision problems Diabetes Glaucoma Heart disease High blood pressure Infection (especially a virus infection such as chickenpox, cold sores, or herpes) Liver disease Mental illness Myasthenia gravis Osteoporosis Recently received or scheduled to receive a vaccine Seizures Stomach or intestine problems Thyroid disease An unusual or allergic reaction to lactose, methylprednisolone, other medications, foods, dyes, or preservatives Pregnant or trying to get pregnant Breast-feeding How should I use this medication? Take this medication by mouth with a glass of water. Follow the directions on the prescription label. Take this medication with food.  If you are taking this medication once a day, take it in the morning. Do not take it more often than directed. Do not suddenly stop taking your medication because you may develop a severe reaction. Your care team will tell you how much medication to take. If your care team wants you to stop the medication, the dose may be slowly lowered over time to avoid any side effects. Talk to your care team about the use of this medication in children. Special care may be needed. Overdosage: If you think you have taken too much of this medicine contact a poison control center or emergency room at once. NOTE: This medicine is only for you. Do not share this medicine with others. What if I miss a dose? If you miss a dose, take it as soon as you can. If it is almost time for your next dose, talk to your care team. You may need to miss a dose or take an extra dose. Do not take double or extra doses without advice. What may interact with this medication? Do not take this medication with any of the following: Alefacept Echinacea Live virus vaccines Metyrapone Mifepristone This medication may also interact with the following: Amphotericin B Aspirin and aspirin-like medications Certain antibiotics like erythromycin, clarithromycin, troleandomycin Certain medications for diabetes Certain medications for fungal infections like ketoconazole Certain medications for seizures like carbamazepine, phenobarbital, phenytoin Certain medications that treat or prevent blood clots like warfarin Cholestyramine Cyclosporine Digoxin Diuretics Female  hormones, like estrogens and birth control pills Isoniazid NSAIDs, medications for pain inflammation, like ibuprofen or naproxen Other medications for myasthenia gravis Rifampin Vaccines This list may not describe all possible interactions. Give your health care provider a list of all the medicines, herbs, non-prescription drugs, or dietary supplements you use. Also tell them  if you smoke, drink alcohol, or use illegal drugs. Some items may interact with your medicine. What should I watch for while using this medication? Tell your care team if your symptoms do not start to get better or if they get worse. Do not stop taking except on your care team's advice. You may develop a severe reaction. Your care team will tell you how much medication to take. This medication may increase your risk of getting an infection. Tell your care team if you are around anyone with measles or chickenpox, or if you develop sores or blisters that do not heal properly. This medication may increase blood sugar levels. Ask your care team if changes in diet or medications are needed if you have diabetes. Tell your care team right away if you have any change in your eyesight. Using this medication for a long time may increase your risk of low bone mass. Talk to your care team about bone health. What side effects may I notice from receiving this medication? Side effects that you should report to your care team as soon as possible: Allergic reactions--skin rash, itching, hives, swelling of the face, lips, tongue, or throat Cushing syndrome--increased fat around the midsection, upper back, neck, or face, pink or purple stretch marks on the skin, thinning, fragile skin that easily bruises, unexpected hair growth High blood sugar (hyperglycemia)--increased thirst or amount of urine, unusual weakness or fatigue, blurry vision Increase in blood pressure Infection--fever, chills, cough, sore throat, wounds that don't heal, pain or trouble when passing urine, general feeling of discomfort or being unwell Low adrenal gland function--nausea, vomiting, loss of appetite, unusual weakness or fatigue, dizziness Mood and behavior changes--anxiety, nervousness, confusion, hallucinations, irritability, hostility, thoughts of suicide or self-harm, worsening mood, feelings of depression Stomach bleeding--bloody or  black, tar-like stools, vomiting blood or brown material that looks like coffee grounds Swelling of the ankles, hands, or feet Side effects that usually do not require medical attention (report to your care team if they continue or are bothersome): Acne General discomfort and fatigue Headache Increase in appetite Nausea Trouble sleeping Weight gain This list may not describe all possible side effects. Call your doctor for medical advice about side effects. You may report side effects to FDA at 1-800-FDA-1088. Where should I keep my medication? Keep out of the reach of children and pets. Store at room temperature between 20 and 25 degrees C (68 and 77 degrees F). Throw away any unused medication after the expiration date. NOTE: This sheet is a summary. It may not cover all possible information. If you have questions about this medicine, talk to your doctor, pharmacist, or health care provider.  2022 Elsevier/Gold Standard (2021-02-24 00:00:00)

## 2022-02-04 NOTE — Progress Notes (Addendum)
[facility]    Provider:  Dr Jaynee Eagles Requesting Provider: Ronita Hipps, MD Primary Care Provider:  Ronita Hipps, MD  CC:  numbness in the legs, progressive  HPI:  Christine Brady is a 61 y.o. female here as requested by Ronita Hipps, MD for neuropathy/numbness and tingling. PMHx ITP, chronic headaches, HLD, IBS, Gerd. 3 weeks after surgery 10/05/2021 she was in a brace on her right leg and it straight, started in both feet, acute, sudden,total bottom of th feet, no back pain, she got up one mornng and her feet felt funny, she was still inte cast but both were symmetrical. Feels like she has socks in her shoes. Started progressing in February and since has Progressed up to her waistline when she wipes she feels numb. Peroneal are feels numb. She has urinary retention. She developed a UTI and never had that before. Goes up to the lower back and the pain runs down the buttocks. She has weakness in the legs. Progressively worse last few days, falls, using a cane.   Reviewed notes, labs and imaging from outside physicians, which showed:  CT lumbar spine shows: IMPRESSION: 1. Abnormal lumbar nerve root thickening and nodularity , new since previous. Considerations include infectious or inflammatory polyradiculopathy vs tumor (CSF mets or lymphoma). The findings were reviewed with Dr. Jeralyn Ruths, who concurs. 2. Mild disc bulges L2-3 and L3-4 without compressive pathology. 3. Right foraminal and subarticular recess narrowing L4-5 secondary to disc bulge and facet DJD. 4. Right posterolateral protrusion L5-S1 slightly displacing the right S1 nerve root.  Review of Systems: Patient complains of symptoms per HPI as well as the following symptoms falls. Pertinent negatives and positives per HPI. All others negative.   Social History   Socioeconomic History   Marital status: Single    Spouse name: Not on file   Number of children: Not on file   Years of education: Not on  file   Highest education level: Not on file  Occupational History   Not on file  Tobacco Use   Smoking status: Never   Smokeless tobacco: Never  Vaping Use   Vaping Use: Never used  Substance and Sexual Activity   Alcohol use: Yes    Comment: occas   Drug use: No   Sexual activity: Not on file  Other Topics Concern   Not on file  Social History Narrative   Not on file   Social Determinants of Health   Financial Resource Strain: Not on file  Food Insecurity: Not on file  Transportation Needs: Not on file  Physical Activity: Not on file  Stress: Not on file  Social Connections: Not on file  Intimate Partner Violence: Not on file    Family History  Problem Relation Age of Onset   Diabetes Mother    Asthma Mother    Diabetes type II Mother    Alcohol abuse Mother    Hypertension Mother    Diabetes Father    Hypertension Father    Kidney disease Father    Diabetes type II Father    Alcohol abuse Father    Crohn's disease Sister    COPD Sister    Thrombocytopenia Sister    Cerebral palsy Brother    Neuropathy Neg Hx     Past Medical History:  Diagnosis Date   Abdominal pain    Arthritis    difficulty walking   Atypical chest pain 11/11/2016   Bowel obstruction ([place])    Cholesteatoma  of right ear    Chronic headaches    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HCC) 11/11/2016   Cyst    back   Hearing aid worn    History of ITP    Hyperlipidemia 11/11/2016   Large ovary 07/09/2019   PONV (postoperative nausea and vomiting)    Reflux     Patient Active Problem List   Diagnosis Date Noted   Patellar instability of right knee    Complex tear of lateral meniscus of right knee    Loose body in knee, right knee    Constipation 06/05/2021   Diverticular disease of colon 06/05/2021   Family history of other diseases of the digestive system 06/05/2021   Nausea 06/05/2021   Otalgia, left 06/05/2021   Right upper quadrant pain 06/05/2021   Acute lateral  meniscus tear of right knee 06/05/2021   Patellar dislocation, right, sequela 06/05/2021   Diplacusis of left ear 10/02/2020   History of right mastoidectomy 04/10/2020   Postop check 02/29/2020   Bowel obstruction ([place]) 09/28/2019   Idiopathic thrombocytopenia ([place]) 09/28/2019   Eustachian tube dysfunction, bilateral 09/28/2019   Tinnitus aurium, bilateral 09/28/2019   Tympanic membrane central perforation, 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   Unspecified cholesteatoma, left ear 09/28/2019   IBS (irritable bowel syndrome) 07/09/2019   Headache 07/09/2019   GERD (gastroesophageal reflux disease) 07/09/2019   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 07/03/2019 07/03/2019   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   Menopausal syndrome 03/13/2018   History of postoperative nausea 01/19/2017   Atypical chest pain 11/11/2016   Hyperlipidemia 11/11/2016   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[place]) 11/11/2016   Epidermoid cyst on back 10/28/2011    Past Surgical History:  Procedure Laterality Date   CHOLECYSTECTOMY  2010   ESOPHAGOGASTRODUODENOSCOPY (EGD) WITH PROPOFOL N/A 06/12/2019   Procedure: ESOPHAGOGASTRODUODENOSCOPY (EGD) WITH PROPOFOL;  Surgeon: Carol Ada, MD;  Location: [facility];  Service: Endoscopy;  Laterality: N/A;   EXTERNAL EAR SURGERY  2009/2010   EXTERNAL EAR SURGERY  2010   right ear   KNEE ARTHROSCOPY     right   KNEE ARTHROSCOPY WITH MEDIAL PATELLAR FEMORAL LIGAMENT RECONSTRUCTION Right 10/05/2021   Procedure: right knee arthroscoy, debridement, lateral menisectomy, medial patellar femoral ligament reconstruction;  Surgeon: Meredith Pel, MD;  Location: [facility];  Service: Orthopedics;  Laterality: Right;   KNEE LIGAMENT RECONSTRUCTION     left   MASTOIDECTOMY Right 01/19/2017   Procedure: RIGHT MODIFIED RADICAL MASTOIDECTOMY;  Surgeon: Vicie Mutters, MD;  Location: [facility];  Service: ENT;  Laterality: Right;   UPPER ESOPHAGEAL  ENDOSCOPIC ULTRASOUND (EUS) N/A 06/12/2019   Procedure: UPPER ESOPHAGEAL ENDOSCOPIC ULTRASOUND (EUS);  Surgeon: Carol Ada, MD;  Location: [facility];  Service: Endoscopy;  Laterality: N/A;   WISDOM TOOTH EXTRACTION      Current Outpatient Medications  Medication Sig Dispense Refill   apixaban (ELIQUIS) 5 MG TABS tablet Take 2 tablet by mouth 2 times a day for 7 days then 1 tablet by mouth 2 times a day 70 tablet 2   Calcium Carbonate-Vitamin D3 600-400 MG-UNIT TABS Take 2 tablets by mouth daily.     celecoxib (CELEBREX) 100 MG capsule Take 1 capsule (100 mg total) by mouth 2 (two) times daily. 60 capsule 0   cyanocobalamin (DODEX) 1000 MCG/ML injection Inject 1 ml into the muscle once a month 1 mL 0   dexamethasone (DECADRON) 4 MG tablet Take 10 tablets (40 mg total) by mouth daily. Take for 4 days  straight every month, take with food. 40 tablet 5   Diclofenac Sodium 3 % GEL Apply topically to the affected area on the lower lip once daily. 100 g 0   dicyclomine (BENTYL) 10 MG capsule Take 1 capsule (10 mg total) by mouth every 8 (eight) hours as needed for spasms. 90 capsule 0   HYDROcodone-acetaminophen (NORCO/VICODIN) 5-325 MG tablet Take 1 tablet by mouth every 12 (twelve) hours as needed for moderate pain. 30 tablet 0   hydroquinone 4 % cream Apply externally twice a day for spot treatment for 2 months then take a one month break. 28.35 g 0   Hyoscyamine Sulfate SL 0.125 MG SUBL Take by mouth.     ibuprofen (ADVIL) 200 MG tablet Take 400 mg by mouth every 6 (six) hours as needed for headache or moderate pain.     methocarbamol (ROBAXIN) 500 MG tablet Take 1 tablet by mouth every 12 hours as needed. 40 tablet 0   omeprazole (PRILOSEC) 40 MG capsule Take 1 capsule (40 mg total) by mouth daily. 90 capsule 3   ondansetron (ZOFRAN) 4 MG tablet Take 1 tablet (4 mg total) by mouth every 8 (eight) hours as needed for nausea or vomiting. 30 tablet 0   Peppermint Oil (IBGARD) 90 MG CPCR Take 1  capsule by mouth daily.     polyethylene glycol powder (GLYCOLAX/MIRALAX) 17 GM/SCOOP powder Take 1 scoop mixed in 8 oz of fluid daily 1530 g 4   PRESCRIPTION MEDICATION Take 1 capsule by mouth daily before breakfast. Ultra Flora Probiotic     temazepam (RESTORIL) 15 MG capsule Take 1 capsule (15 mg total) by mouth at bedtime as needed for sleep. 30 capsule 0   triamcinolone ointment (KENALOG) 0.1 % Apply topically to the affected area 2 times a day 454 g 0   pregabalin (LYRICA) 100 MG capsule Take 1 capsule (100 mg total) by mouth 3 (three) times daily. 90 capsule 3   rosuvastatin (CRESTOR) 5 MG tablet TAKE 1 TABLET BY MOUTH TWICE WEEKLY 90 tablet 0   No current facility-administered medications for this visit.    Allergies as of 02/04/2022 - Review Complete 02/04/2022  Allergen Reaction Noted   Loratadine  04/27/2019   Oxycodone  06/04/2019   Claritin-d [loratadine-pseudoephedrine er] Rash 10/27/2011   Codeine Rash 10/27/2011    Vitals: BP 123/86    Pulse 71    Ht 5\' 2"  (1.575 m)    Wt 140 lb (63.5 kg)    BMI 25.61 kg/m  Last Weight:  Wt Readings from Last 1 Encounters:  02/04/22 140 lb (63.5 kg)   Last Height:   Ht Readings from Last 1 Encounters:  02/04/22 5\' 2"  (1.575 m)     Physical exam: Exam: Gen: NAD, conversant, well nourised, well groomed                     CV: RRR, no MRG. No Carotid Bruits. No peripheral edema, warm, nontender Eyes: Conjunctivae clear without exudates or hemorrhage  Neuro: Detailed Neurologic Exam  Speech:    Speech is normal; fluent and spontaneous with normal comprehension.  Cognition:    The patient is oriented to person, place, and time;     recent and remote memory intact;     language fluent;     normal attention, concentration,     fund of knowledge Cranial Nerves:    Left pupil pinpoint, right pupil 55mm and reactive, anisocoria.pupils too small to visualize fundi. Visual fields  are full to finger confrontation. Extraocular  movements are intact. Trigeminal sensation is intact and the muscles of mastication are normal. The face is symmetric. The palate elevates in the midline. Hearing intact. Voice is normal. Shoulder shrug is normal. The tongue has normal motion without fasciculations.   Coordination:    Normal finger to nose , difficulty with HTS  Gait:    Heel-toe and tandem gait are abnormal. Wide based and ataxic.   Motor Observation:    No asymmetry, no atrophy, and no involuntary movements noted. Tone:    Normal muscle tone.    Posture:    Posture is normal. normal erect    Strength: right hip flexion 3+, left hip flexion 4/5, bilateral leg extension 4+, bilateral leg flexion 3, bilateral DF 3+ bilat.    Strength is V/V in the upper limbs.      Sensation: absent to pp and temp to above the knees, absent proprioception great toes, intact vibration     Reflex Exam:  DTR's:    Deep tendon reflexes in the upper and lower extremities are absent  bilaterally.   Toes:    The toes are downgoing bilaterally.   Clonus:    Clonus is absent.    Assessment/Plan:  Patient with ascending numbness,tingling,weakness with absent reflexes, Abnormal lumbar nerve root thickening, likely CIDP.(Addendum csf protein > 100 only slightly elevated WBCs likely CIDP). For acute worsening go to ED.   [facility], she is getting lumbar puncture today then coming back for IV 1g solumedrol - For patients treated with pulse-dose glucocorticoids, we typically start with an initial dose of - IV methylprednisolone (1000 mg/day) for three days, followed by 1000 mg one day a week for four week. Oral pulsed steroids may be an efficacious alternative to an intravenous pulsed regimen. Options include dexamethasone 40 mg for four consecutive days once a month or prednisone 600 mg once a week.  - We will get IVIG approved for you until then will do pulse-steroids. Prior to that will need lumbar puncture and emg/ncs 12:15  lumbar puncture Come back for 1g Prednisone Start methypred  40mg /day tomorrow For acute worsening go to ED and let us know so we can inform [facility] needs IVIG  Orders Placed This Encounter  Procedures   CT HEAD WO CONTRAST (5MM)   DG FL GUIDED LUMBAR PUNCTURE   IgA   Hemoglobin A1c   TSH   NCV with EMG(electromyography)   Meds ordered this encounter  Medications   DISCONTD: Dexamethasone 20 MG TABS    Sig: Take 2 tablets (40 mg) by mouth daily. Take for four days straight every month, take with food    Dispense:  8 tablet    Refill:  6    Cc: Ronita Hipps, MD,  Ronita Hipps, MD  Sarina Ill, MD  [facility] [address] [place], 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 Guillain Barr syndrome ([place])   3. Ataxia   4. Weakness of both lower extremities   5. Fall, initial encounter   6. Long-term use of high-risk medication    diagnosis.  This included previsit chart review, lab review, study review, order entry, electronic health record documentation, patient education on the different diagnostic and therapeutic options, counseling and coordination of care, risks and benefits of management, compliance, or  risk factor reduction

## 2022-02-04 NOTE — Discharge Instructions (Signed)

## 2022-02-05 ENCOUNTER — Telehealth: Payer: Self-pay | Admitting: Neurology

## 2022-02-05 DIAGNOSIS — G6181 Chronic inflammatory demyelinating polyneuritis: Secondary | ICD-10-CM

## 2022-02-05 LAB — HEMOGLOBIN A1C
Est. average glucose Bld gHb Est-mCnc: 108 mg/dL
Hgb A1c MFr Bld: 5.4 % (ref 4.8–5.6)

## 2022-02-05 LAB — CYTOLOGY - NON PAP

## 2022-02-05 LAB — IGA: IgA/Immunoglobulin A, Serum: 142 mg/dL (ref 87–352)

## 2022-02-05 LAB — TSH: TSH: 3.33 u[IU]/mL (ref 0.450–4.500)

## 2022-02-05 MED ORDER — PREGABALIN 100 MG PO CAPS: 100.0000 mg | ORAL_CAPSULE | Freq: Three times a day (TID) | ORAL | 3 refills | Status: DC

## 2022-02-05 NOTE — Telephone Encounter (Signed)
Pt has called to report that Dr Jaynee Eagles informed her to call back this morning for a provider if she was not feeling any better even after an infusion.  Pt states she is not, she is having great difficulty walking.  This message is being sent as High Priority to On call Dr

## 2022-02-05 NOTE — Telephone Encounter (Signed)
I have called patient, she still has dense numbness of limbs, also complains of significant low back pain, previously tried gabapentin, could not tolerate it,  We will try Lyrica, 100 mg 3 times daily.  Meds ordered this encounter  Medications   pregabalin (LYRICA) 100 MG capsule    Sig: Take 1 capsule (100 mg total) by mouth 3 (three) times daily.    Dispense:  90 capsule    Refill:  3     Advised her to call back Monday for further discussion

## 2022-02-08 ENCOUNTER — Ambulatory Visit (INDEPENDENT_AMBULATORY_CARE_PROVIDER_SITE_OTHER): Payer: 59 | Admitting: Orthopedic Surgery

## 2022-02-08 ENCOUNTER — Other Ambulatory Visit: Payer: Self-pay

## 2022-02-08 ENCOUNTER — Encounter: Payer: Self-pay | Admitting: Orthopedic Surgery

## 2022-02-08 ENCOUNTER — Encounter: Payer: Self-pay | Admitting: Neurology

## 2022-02-08 ENCOUNTER — Telehealth: Payer: Self-pay | Admitting: *Deleted

## 2022-02-08 DIAGNOSIS — R200 Anesthesia of skin: Secondary | ICD-10-CM

## 2022-02-08 NOTE — Telephone Encounter (Signed)
Completed [employer] [place] on Cover My Meds. Key: [id number]. Awaiting determination from [place].

## 2022-02-08 NOTE — Telephone Encounter (Signed)
Patient has accepted NCS/EMG tomorrow afternoon. I have added her to the schedules. Please see her inquiry about the infusions. I wasn't sure if it was still in the pending approval stages or if she needs to contact intrafusion about scheduling.

## 2022-02-08 NOTE — Progress Notes (Signed)
Post-Op Visit Note   Patient: Christine Brady           Date of Birth: Nov 11, 1961           MRN: [id number] Visit Date: 02/08/2022 PCP: Ronita Hipps, MD   Assessment & Plan:  Chief Complaint:  Chief Complaint  Patient presents with   Other     Scan review   Visit Diagnoses:  1. Numbness of foot     Plan: Christine Brady is a 61 year old patient who is doing well following her patella stabilization MPFL reconstruction.  She is now on Lyrica.  She was diagnosed with Guillain-Barr syndrome.  Currently undergoing steroid treatment for that.  She has 3 more infusions of IV Solu-Medrol.  Nerve conduction pending for tomorrow.  Overall the ascending numbness and tingling she was having improved.  Plan is therapy to start strengthening an 8-week return.  Follow-Up Instructions: Return in about 8 weeks (around 04/05/2022).   Orders:  No orders of the defined types were placed in this encounter.  No orders of the defined types were placed in this encounter.   Imaging: No results found.  PMFS History: Patient Active Problem List   Diagnosis Date Noted   Patellar instability of right knee    Complex tear of lateral meniscus of right knee    Loose body in knee, right knee    Constipation 06/05/2021   Diverticular disease of colon 06/05/2021   Family history of other diseases of the digestive system 06/05/2021   Nausea 06/05/2021   Otalgia, left 06/05/2021   Right upper quadrant pain 06/05/2021   Acute lateral meniscus tear of right knee 06/05/2021   Patellar dislocation, right, sequela 06/05/2021   Diplacusis of left ear 10/02/2020   History of right mastoidectomy 04/10/2020   Postop check 02/29/2020   Bowel obstruction ([place]) 09/28/2019   Idiopathic thrombocytopenia ([place]) 09/28/2019   Eustachian tube dysfunction, bilateral 09/28/2019   Tinnitus aurium, bilateral 09/28/2019   Tympanic membrane central perforation, 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   Unspecified cholesteatoma, left ear 09/28/2019    IBS (irritable bowel syndrome) 07/09/2019   Headache 07/09/2019   GERD (gastroesophageal reflux disease) 07/09/2019   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 07/03/2019 07/03/2019   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   Menopausal syndrome 03/13/2018   History of postoperative nausea 01/19/2017   Atypical chest pain 11/11/2016   Hyperlipidemia 11/11/2016   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[place]) 11/11/2016   Epidermoid cyst on back 10/28/2011   Past Medical History:  Diagnosis Date   Abdominal pain    Arthritis    difficulty walking   Atypical chest pain 11/11/2016   Bowel obstruction ([facility])    Cholesteatoma of right ear    Chronic headaches    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[place]) 11/11/2016   Cyst    back   Hearing aid worn    History of ITP    Hyperlipidemia 11/11/2016   Large ovary 07/09/2019   PONV (postoperative nausea and vomiting)    Reflux     Family History  Problem Relation Age of Onset   Diabetes Mother    Asthma Mother    Diabetes type II Mother    Alcohol abuse Mother    Hypertension Mother    Diabetes Father    Hypertension Father    Kidney disease Father    Diabetes type II Father    Alcohol abuse Father    Crohn's disease Sister    COPD Sister    Thrombocytopenia Sister  Cerebral palsy Brother    Neuropathy Neg Hx     Past Surgical History:  Procedure Laterality Date   CHOLECYSTECTOMY  2010   ESOPHAGOGASTRODUODENOSCOPY (EGD) WITH PROPOFOL N/A 06/12/2019   Procedure: ESOPHAGOGASTRODUODENOSCOPY (EGD) WITH PROPOFOL;  Surgeon: Carol Ada, MD;  [facility]: [facility];  Service: Endoscopy;  Laterality: N/A;   EXTERNAL EAR SURGERY  2009/2010   EXTERNAL EAR SURGERY  2010   right ear   KNEE ARTHROSCOPY     right   KNEE ARTHROSCOPY WITH MEDIAL PATELLAR FEMORAL LIGAMENT RECONSTRUCTION Right 10/05/2021   Procedure: right knee arthroscoy, debridement, lateral menisectomy, medial patellar femoral ligament reconstruction;   Surgeon: Meredith Pel, MD;  Location: [facility];  Service: Orthopedics;  Laterality: Right;   KNEE LIGAMENT RECONSTRUCTION     left   MASTOIDECTOMY Right 01/19/2017   Procedure: RIGHT MODIFIED RADICAL MASTOIDECTOMY;  Surgeon: Vicie Mutters, MD;  Location: [facility];  Service: ENT;  Laterality: Right;   UPPER ESOPHAGEAL ENDOSCOPIC ULTRASOUND (EUS) N/A 06/12/2019   Procedure: UPPER ESOPHAGEAL ENDOSCOPIC ULTRASOUND (EUS);  Surgeon: Carol Ada, MD;  Location: [facility];  Service: Endoscopy;  Laterality: N/A;   WISDOM TOOTH EXTRACTION     Social History   Occupational History   Not on file  Tobacco Use   Smoking status: Never   Smokeless tobacco: Never  Vaping Use   Vaping Use: Never used  Substance and Sexual Activity   Alcohol use: Yes    Comment: occas   Drug use: No   Sexual activity: Not on file

## 2022-02-08 NOTE — Telephone Encounter (Signed)
We will work on IVIG.

## 2022-02-09 ENCOUNTER — Encounter (HOSPITAL_COMMUNITY): Payer: Self-pay

## 2022-02-09 ENCOUNTER — Inpatient Hospital Stay (HOSPITAL_COMMUNITY)
Admission: EM | Admit: 2022-02-09 | Discharge: 2022-02-19 | DRG: 095 | Disposition: A | Discharge status: Home / Self Care | Payer: 59 | Attending: Internal Medicine | Admitting: Internal Medicine

## 2022-02-09 ENCOUNTER — Telehealth: Payer: Self-pay | Admitting: Neurology

## 2022-02-09 ENCOUNTER — Encounter: Payer: 59 | Admitting: Neurology

## 2022-02-09 ENCOUNTER — Inpatient Hospital Stay (HOSPITAL_COMMUNITY): Payer: 59

## 2022-02-09 ENCOUNTER — Other Ambulatory Visit: Payer: Self-pay

## 2022-02-09 DIAGNOSIS — M1990 Unspecified osteoarthritis, unspecified site: Secondary | ICD-10-CM | POA: Diagnosis present

## 2022-02-09 DIAGNOSIS — Z9889 Other specified postprocedural states: Secondary | ICD-10-CM

## 2022-02-09 DIAGNOSIS — Z841 Family history of disorders of kidney and ureter: Secondary | ICD-10-CM | POA: Diagnosis not present

## 2022-02-09 DIAGNOSIS — R935 Abnormal findings on diagnostic imaging of other abdominal regions, including retroperitoneum: Secondary | ICD-10-CM | POA: Diagnosis not present

## 2022-02-09 DIAGNOSIS — E7800 Pure hypercholesterolemia, unspecified: Secondary | ICD-10-CM | POA: Diagnosis not present

## 2022-02-09 DIAGNOSIS — R7401 Elevation of levels of liver transaminase levels: Secondary | ICD-10-CM | POA: Diagnosis not present

## 2022-02-09 DIAGNOSIS — Z7901 Long term (current) use of anticoagulants: Secondary | ICD-10-CM

## 2022-02-09 DIAGNOSIS — Z885 Allergy status to narcotic agent status: Secondary | ICD-10-CM

## 2022-02-09 DIAGNOSIS — Z811 Family history of alcohol abuse and dependence: Secondary | ICD-10-CM

## 2022-02-09 DIAGNOSIS — K219 Gastro-esophageal reflux disease without esophagitis: Secondary | ICD-10-CM | POA: Diagnosis present

## 2022-02-09 DIAGNOSIS — I9589 Other hypotension: Secondary | ICD-10-CM | POA: Diagnosis not present

## 2022-02-09 DIAGNOSIS — I82409 Acute embolism and thrombosis of unspecified deep veins of unspecified lower extremity: Secondary | ICD-10-CM | POA: Diagnosis present

## 2022-02-09 DIAGNOSIS — K2100 Gastro-esophageal reflux disease with esophagitis, without bleeding: Secondary | ICD-10-CM | POA: Diagnosis not present

## 2022-02-09 DIAGNOSIS — K5900 Constipation, unspecified: Secondary | ICD-10-CM | POA: Diagnosis present

## 2022-02-09 DIAGNOSIS — J189 Pneumonia, unspecified organism: Secondary | ICD-10-CM | POA: Diagnosis not present

## 2022-02-09 DIAGNOSIS — B9620 Unspecified Escherichia coli [E. coli] as the cause of diseases classified elsewhere: Secondary | ICD-10-CM | POA: Diagnosis present

## 2022-02-09 DIAGNOSIS — Z79899 Other long term (current) drug therapy: Secondary | ICD-10-CM

## 2022-02-09 DIAGNOSIS — S83281D Other tear of lateral meniscus, current injury, right knee, subsequent encounter: Secondary | ICD-10-CM

## 2022-02-09 DIAGNOSIS — Z96651 Presence of right artificial knee joint: Secondary | ICD-10-CM | POA: Diagnosis present

## 2022-02-09 DIAGNOSIS — Z9049 Acquired absence of other specified parts of digestive tract: Secondary | ICD-10-CM | POA: Diagnosis not present

## 2022-02-09 DIAGNOSIS — Z8249 Family history of ischemic heart disease and other diseases of the circulatory system: Secondary | ICD-10-CM | POA: Diagnosis not present

## 2022-02-09 DIAGNOSIS — G610 Guillain-Barre syndrome: Principal | ICD-10-CM | POA: Diagnosis present

## 2022-02-09 DIAGNOSIS — Z0389 Encounter for observation for other suspected diseases and conditions ruled out: Secondary | ICD-10-CM | POA: Diagnosis not present

## 2022-02-09 DIAGNOSIS — I10 Essential (primary) hypertension: Secondary | ICD-10-CM | POA: Diagnosis not present

## 2022-02-09 DIAGNOSIS — Z888 Allergy status to other drugs, medicaments and biological substances status: Secondary | ICD-10-CM | POA: Diagnosis not present

## 2022-02-09 DIAGNOSIS — Z20822 Contact with and (suspected) exposure to covid-19: Secondary | ICD-10-CM | POA: Diagnosis not present

## 2022-02-09 DIAGNOSIS — Z86718 Personal history of other venous thrombosis and embolism: Secondary | ICD-10-CM

## 2022-02-09 DIAGNOSIS — N390 Urinary tract infection, site not specified: Secondary | ICD-10-CM | POA: Diagnosis not present

## 2022-02-09 DIAGNOSIS — Z825 Family history of asthma and other chronic lower respiratory diseases: Secondary | ICD-10-CM | POA: Diagnosis not present

## 2022-02-09 DIAGNOSIS — Z833 Family history of diabetes mellitus: Secondary | ICD-10-CM | POA: Diagnosis not present

## 2022-02-09 DIAGNOSIS — Z9621 Cochlear implant status: Secondary | ICD-10-CM | POA: Diagnosis present

## 2022-02-09 DIAGNOSIS — G629 Polyneuropathy, unspecified: Secondary | ICD-10-CM | POA: Diagnosis not present

## 2022-02-09 DIAGNOSIS — D693 Immune thrombocytopenic purpura: Secondary | ICD-10-CM | POA: Diagnosis present

## 2022-02-09 DIAGNOSIS — Z4901 Encounter for fitting and adjustment of extracorporeal dialysis catheter: Secondary | ICD-10-CM | POA: Diagnosis not present

## 2022-02-09 DIAGNOSIS — E785 Hyperlipidemia, unspecified: Secondary | ICD-10-CM | POA: Diagnosis present

## 2022-02-09 DIAGNOSIS — G6181 Chronic inflammatory demyelinating polyneuritis: Secondary | ICD-10-CM | POA: Diagnosis not present

## 2022-02-09 LAB — BASIC METABOLIC PANEL
Anion gap: 7 (ref 5–15)
BUN: 19 mg/dL (ref 6–20)
CO2: 29 mmol/L (ref 22–32)
Calcium: 9.5 mg/dL (ref 8.9–10.3)
Chloride: 100 mmol/L (ref 98–111)
Creatinine, Ser: 0.68 mg/dL (ref 0.44–1.00)
GFR, Estimated: >60 mL/min (ref >60)
Glucose, Bld: 100 mg/dL — ABNORMAL HIGH (ref 70–99)
Potassium: 3.5 mmol/L (ref 3.5–5.1)
Sodium: 136 mmol/L (ref 135–145)

## 2022-02-09 LAB — RESP PANEL BY RT-PCR (FLU A&B, COVID) ARPGX2
Influenza A by PCR: NEGATIVE
Influenza B by PCR: NEGATIVE
SARS Coronavirus 2 by RT PCR: NEGATIVE

## 2022-02-09 LAB — URINALYSIS, ROUTINE W REFLEX MICROSCOPIC
Bilirubin Urine: NEGATIVE
Glucose, UA: NEGATIVE mg/dL
Hgb urine dipstick: NEGATIVE
Ketones, ur: NEGATIVE mg/dL
Nitrite: NEGATIVE
Protein, ur: NEGATIVE mg/dL
Specific Gravity, Urine: 1.012 (ref 1.005–1.030)
pH: 7 (ref 5.0–8.0)

## 2022-02-09 LAB — CBC WITH DIFFERENTIAL/PLATELET
Abs Immature Granulocytes: 0.06 10*3/uL (ref 0.00–0.07)
Basophils Absolute: 0 10*3/uL (ref 0.0–0.1)
Basophils Relative: 0 %
Eosinophils Absolute: 0 10*3/uL (ref 0.0–0.5)
Eosinophils Relative: 0 %
HCT: 38 % (ref 36.0–46.0)
Hemoglobin: 13.1 g/dL (ref 12.0–15.0)
Immature Granulocytes: 1 %
Lymphocytes Relative: 41 %
Lymphs Abs: 3.4 10*3/uL (ref 0.7–4.0)
MCH: 32.6 pg (ref 26.0–34.0)
MCHC: 34.5 g/dL (ref 30.0–36.0)
MCV: 94.5 fL (ref 80.0–100.0)
Monocytes Absolute: 0.7 10*3/uL (ref 0.1–1.0)
Monocytes Relative: 8 %
Neutro Abs: 4.2 10*3/uL (ref 1.7–7.7)
Neutrophils Relative %: 50 %
Platelets: 126 10*3/uL — ABNORMAL LOW (ref 150–400)
RBC: 4.02 MIL/uL (ref 3.87–5.11)
RDW: 11.9 % (ref 11.5–15.5)
WBC: 8.4 10*3/uL (ref 4.0–10.5)
nRBC: 0 % (ref 0.0–0.2)

## 2022-02-09 LAB — HIV ANTIBODY (ROUTINE TESTING W REFLEX): HIV Screen 4th Generation wRfx: NONREACTIVE

## 2022-02-09 LAB — SEDIMENTATION RATE: Sed Rate: 8 mm/hr (ref 0–22)

## 2022-02-09 LAB — FOLATE: Folate: 13.7 ng/mL (ref >5.9)

## 2022-02-09 LAB — MAGNESIUM: Magnesium: 2.1 mg/dL (ref 1.7–2.4)

## 2022-02-09 LAB — C-REACTIVE PROTEIN: CRP: 0.6 mg/dL (ref <1.0)

## 2022-02-09 MED ORDER — ONDANSETRON HCL 4 MG/2ML IJ SOLN
4.0000 mg | Freq: Four times a day (QID) | INTRAMUSCULAR | Status: DC | PRN
  Administered 2022-02-10 – 2022-02-18 (×4): 4 mg via INTRAVENOUS
  Filled 2022-02-09 (×4): qty 2

## 2022-02-09 MED ORDER — ACETAMINOPHEN 650 MG RE SUPP: 650.0000 mg | Freq: Four times a day (QID) | RECTAL | Status: DC | PRN

## 2022-02-09 MED ORDER — TRAZODONE HCL 50 MG PO TABS
50.0000 mg | ORAL_TABLET | Freq: Every evening | ORAL | Status: DC | PRN
  Administered 2022-02-09 – 2022-02-18 (×9): 50 mg via ORAL
  Filled 2022-02-09 (×10): qty 1

## 2022-02-09 MED ORDER — THIAMINE HCL 100 MG/ML IJ SOLN
100.0000 mg | Freq: Every day | INTRAMUSCULAR | Status: DC
Start: 2022-02-18 — End: 2022-02-19
  Administered 2022-02-18 – 2022-02-19 (×2): 100 mg via INTRAVENOUS
  Filled 2022-02-09 (×2): qty 2

## 2022-02-09 MED ORDER — THIAMINE HCL 100 MG/ML IJ SOLN
250.0000 mg | Freq: Every day | INTRAVENOUS | Status: AC
  Administered 2022-02-12 – 2022-02-17 (×6): 250 mg via INTRAVENOUS
  Filled 2022-02-09 (×6): qty 2.5

## 2022-02-09 MED ORDER — IOHEXOL 350 MG/ML SOLN
75.0000 mL | Freq: Once | INTRAVENOUS | Status: AC | PRN
  Administered 2022-02-14: 75 mL via INTRAVENOUS

## 2022-02-09 MED ORDER — ONDANSETRON HCL 4 MG PO TABS
4.0000 mg | ORAL_TABLET | Freq: Four times a day (QID) | ORAL | Status: DC | PRN
  Administered 2022-02-12 – 2022-02-18 (×3): 4 mg via ORAL
  Filled 2022-02-09 (×3): qty 1

## 2022-02-09 MED ORDER — PANTOPRAZOLE SODIUM 40 MG PO TBEC
40.0000 mg | DELAYED_RELEASE_TABLET | Freq: Every day | ORAL | Status: DC
  Administered 2022-02-10 – 2022-02-19 (×10): 40 mg via ORAL
  Filled 2022-02-09 (×10): qty 1

## 2022-02-09 MED ORDER — POLYETHYLENE GLYCOL 3350 17 GM/SCOOP PO POWD: 1.0000 | Freq: Every day | ORAL | Status: DC | PRN

## 2022-02-09 MED ORDER — APIXABAN 5 MG PO TABS
5.0000 mg | ORAL_TABLET | Freq: Two times a day (BID) | ORAL | Status: DC
  Administered 2022-02-09: 5 mg via ORAL
  Filled 2022-02-09: qty 1

## 2022-02-09 MED ORDER — THIAMINE HCL 100 MG/ML IJ SOLN
500.0000 mg | Freq: Three times a day (TID) | INTRAVENOUS | Status: AC
  Administered 2022-02-10 – 2022-02-11 (×5): 500 mg via INTRAVENOUS
  Filled 2022-02-09 (×8): qty 5

## 2022-02-09 MED ORDER — MORPHINE SULFATE (PF) 2 MG/ML IV SOLN
2.0000 mg | Freq: Once | INTRAVENOUS | Status: AC
Start: 2022-02-09 — End: 2022-02-09
  Administered 2022-02-09: 2 mg via INTRAVENOUS
  Filled 2022-02-09: qty 1

## 2022-02-09 MED ORDER — ACETAMINOPHEN 325 MG PO TABS
650.0000 mg | ORAL_TABLET | Freq: Four times a day (QID) | ORAL | Status: DC | PRN
  Filled 2022-02-09: qty 2

## 2022-02-09 MED ORDER — OYSTER SHELL CALCIUM/D3 500-5 MG-MCG PO TABS
2.0000 | ORAL_TABLET | Freq: Every day | ORAL | Status: DC
  Administered 2022-02-10 – 2022-02-19 (×10): 2 via ORAL
  Filled 2022-02-09 (×11): qty 2

## 2022-02-09 MED ORDER — DIPHENHYDRAMINE HCL 25 MG PO CAPS
25.0000 mg | ORAL_CAPSULE | Freq: Four times a day (QID) | ORAL | Status: DC | PRN
  Administered 2022-02-10 – 2022-02-17 (×3): 25 mg via ORAL
  Filled 2022-02-09 (×2): qty 1

## 2022-02-09 MED ORDER — LACTATED RINGERS IV SOLN: INTRAVENOUS | Status: DC

## 2022-02-09 MED ORDER — POLYETHYLENE GLYCOL 3350 17 G PO PACK
17.0000 g | PACK | Freq: Every day | ORAL | Status: DC | PRN
  Administered 2022-02-09 – 2022-02-19 (×8): 17 g via ORAL
  Filled 2022-02-09 (×8): qty 1

## 2022-02-09 MED ORDER — POTASSIUM CHLORIDE CRYS ER 20 MEQ PO TBCR
20.0000 meq | EXTENDED_RELEASE_TABLET | Freq: Once | ORAL | Status: AC
  Administered 2022-02-09: 20 meq via ORAL
  Filled 2022-02-09: qty 1

## 2022-02-09 MED ORDER — OXYCODONE HCL 5 MG PO TABS
5.0000 mg | ORAL_TABLET | ORAL | Status: DC | PRN
  Administered 2022-02-09 – 2022-02-18 (×16): 5 mg via ORAL
  Filled 2022-02-09 (×18): qty 1

## 2022-02-09 NOTE — ED Provider Notes (Signed)
[facility] DEPT Provider Note   CSN: [id number] Arrival date & time: 02/09/22  1002     History  Chief Complaint  Patient presents with   Numbness    Christine Brady is a 61 y.o. female.  HPI  Patient is a 61 year old female with medical history notable for hyperlipidemia, hypertension, chronic ITP, senting with numbness to the head and tongue.  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  She was initially having numbness to the lower extremities bilaterally, seen by neurologist and had an elevated WBC in her spinal fluid.  States she woke up this morning with severe headache, her head feels numb.  Also feels like her tongue is blue and numb.  Spoke with her neurologist told her to come to the ED for likely admission and IVIG.  Home Medications Prior to Admission medications   Medication Sig Start Date End Date Taking? Authorizing Provider  Calcium Carbonate-Vitamin D3 600-400 MG-UNIT TABS Take 2 tablets by mouth daily.   Yes [provider]  celecoxib (CELEBREX) 100 MG capsule Take 1 capsule (100 mg total) by mouth 2 (two) times daily. Patient taking differently: Take 100 mg by mouth 2 (two) times daily as needed for mild pain. 12/01/21 12/01/22 Yes Magnant, Charles L, PA-C  dexamethasone (DECADRON) 4 MG tablet Take 10 tablets (40 mg total) by mouth daily. Take for 4 days straight every month, take with food. Patient not taking: Reported on 02/09/2022 02/04/22   Melvenia Beam, MD  Multiple Vitamins-Calcium (ONE-A-DAY WOMENS FORMULA) TABS Take 1 tablet by mouth daily with breakfast.   Yes [provider]  omeprazole (PRILOSEC) 40 MG capsule Take 1 capsule (40 mg total) by mouth daily. Patient taking differently: Take 40 mg by mouth every 3 (three) days. 04/06/21  Yes Nandigam, Venia Minks, MD  ondansetron (ZOFRAN) 4 MG tablet Take 1 tablet (4 mg total) by mouth every 8 (eight) hours as needed for nausea or vomiting.  08/19/21  Yes Meredith Pel, MD  Peppermint Oil (IBGARD) 90 MG CPCR Take 1 capsule by mouth daily.   Yes [provider]  Probiotic Product (PROBIOTIC PO) Take 1 capsule by mouth in the morning.   Yes [provider]  apixaban (ELIQUIS) 5 MG TABS tablet Take 2 tablet by mouth 2 times a day for 7 days then 1 tablet by mouth 2 times a day Patient taking differently: Take 5 mg by mouth in the morning and at bedtime. 01/22/22     cyanocobalamin (DODEX) 1000 MCG/ML injection Inject 1 ml into the muscle once a month 01/15/22     Diclofenac Sodium 3 % GEL Apply topically to the affected area on the lower lip once daily. 09/01/21     dicyclomine (BENTYL) 10 MG capsule Take 1 capsule (10 mg total) by mouth every 8 (eight) hours as needed for spasms. 05/06/21   Mauri Pole, MD  HYDROcodone-acetaminophen (NORCO/VICODIN) 5-325 MG tablet Take 1 tablet by mouth every 12 (twelve) hours as needed for moderate pain. 01/15/22   Meredith Pel, MD  hydroquinone 4 % cream Apply externally twice a day for spot treatment for 2 months then take a one month break. 09/01/21     Hyoscyamine Sulfate SL 0.125 MG SUBL Take by mouth.    [provider]  ibuprofen (ADVIL) 200 MG tablet Take 400 mg by mouth every 6 (six) hours as needed for headache or moderate pain.    [provider]  methocarbamol (  ROBAXIN) 500 MG tablet Take 1 tablet by mouth every 12 hours as needed. 02/01/22   Magnant, Charles L, PA-C  polyethylene glycol powder (GLYCOLAX/MIRALAX) 17 GM/SCOOP powder Take 1 scoop mixed in 8 oz of fluid daily 06/10/21     pregabalin (LYRICA) 100 MG capsule Take 1 capsule (100 mg total) by mouth 3 (three) times daily. 02/05/22   Marcial Pacas, MD  PRESCRIPTION MEDICATION Take 1 capsule by mouth daily before breakfast. Ultra Flora Probiotic    [provider]  rosuvastatin (CRESTOR) 5 MG tablet TAKE 1 TABLET BY MOUTH TWICE WEEKLY 01/08/21 01/08/22  Ronita Hipps, MD  temazepam  (RESTORIL) 15 MG capsule Take 1 capsule (15 mg total) by mouth at bedtime as needed for sleep. 11/18/21   Meredith Pel, MD  triamcinolone ointment (KENALOG) 0.1 % Apply topically to the affected area 2 times a day 12/21/21         Allergies    Oxycodone, Claritin-d [loratadine-pseudoephedrine er], Codeine, Hydrocodone-acetaminophen, and Loratadine    Review of Systems   Review of Systems  Physical Exam Updated Vital Signs BP 126/74    Pulse (!) 54    Temp 98.1 F (36.7 C) (Oral)    Resp 15    Ht 5\' 2"  (1.575 m)    Wt 63.5 kg    SpO2 98%    BMI 25.61 kg/m  Physical Exam Vitals and nursing note reviewed. Exam conducted with a chaperone present.  Constitutional:      General: She is not in acute distress.    Appearance: Normal appearance.  HENT:     Head: Normocephalic and atraumatic.  Eyes:     General: No scleral icterus.    Extraocular Movements: Extraocular movements intact.     Pupils: Pupils are equal, round, and reactive to light.  Cardiovascular:     Rate and Rhythm: Regular rhythm. Bradycardia present.  Pulmonary:     Effort: Pulmonary effort is normal.     Breath sounds: Normal breath sounds.  Skin:    Capillary Refill: Capillary refill takes less than 2 seconds.     Coloration: Skin is not jaundiced.  Neurological:     Mental Status: She is alert. Mental status is at baseline.     Coordination: Coordination normal.     Gait: Gait abnormal.     Deep Tendon Reflexes: Reflexes abnormal.    ED Results / Procedures / Treatments   Labs (all labs ordered are listed, but only abnormal results are displayed) Labs Reviewed  BASIC METABOLIC PANEL - Abnormal; Notable for the following components:      Result Value   Glucose, Bld 100 (*)    All other components within normal limits  CBC WITH DIFFERENTIAL/PLATELET - Abnormal; Notable for the following components:   Platelets 126 (*)    All other components within normal limits  RESP PANEL BY RT-PCR (FLU A&B, COVID)  ARPGX2    EKG None  Radiology No results found.  Procedures Procedures    Medications Ordered in ED Medications  lactated ringers infusion ( Intravenous New Bag/Given 02/09/22 1503)  acetaminophen (TYLENOL) tablet 650 mg (has no administration in time range)    Or  acetaminophen (TYLENOL) suppository 650 mg (has no administration in time range)  ondansetron (ZOFRAN) tablet 4 mg (has no administration in time range)    Or  ondansetron (ZOFRAN) injection 4 mg (has no administration in time range)  Calcium Carbonate-Vitamin D3 600-400 MG-UNIT TABS 2 tablet (has no administration in time  range)  apixaban (ELIQUIS) tablet 5 mg (has no administration in time range)  pantoprazole (PROTONIX) EC tablet 40 mg (has no administration in time range)  polyethylene glycol powder (GLYCOLAX/MIRALAX) container 255 g (has no administration in time range)  morphine (PF) 2 MG/ML injection 2 mg (2 mg Intravenous Given 02/09/22 1512)    ED Course/ Medical Decision Making/ A&P                           Medical Decision Making Amount and/or Complexity of Data Reviewed Labs: ordered. Radiology: ordered.  Risk Decision regarding hospitalization.   61 year old female presenting due to headache and numbness.  She has been followed by neurology, advised by neurology to come to ED for admission and additional work-up.  Patiently recently diagnosed with polyneuropathy versus GBS versus other.  She had CSF which was notable for leukocytes done within the last week.  The numbness is waist down, this morning it started spreading to her head and to her tongue.  She is tolerating oral intake, does not any new focal deficits.  She does have sensation although decreased.  Decreased reflexes to the lower extremity as well.  I did read patient's note from neurology, spoke with on-call neurologist Dr. Thana Ates who will see the patient.  He advises admitting to hospitalist service for now.  Spoke with  hospitalist, Dr. Olevia Bowens will admit the patient.        Final Clinical Impression(s) / ED Diagnoses Final diagnoses:  CIDP (chronic inflammatory demyelinating polyneuropathy) [facility])    Rx / DC Orders ED Discharge Orders     None         Sherrill Raring, Hershal Coria 02/09/22 1523    Godfrey Pick, MD 02/10/22 [phone]

## 2022-02-09 NOTE — Telephone Encounter (Signed)
Update: we have a PA pending on Cover My Meds (see other phone note) for IVIG but patient called with numbness of head and tongue. She is going to go to the ER for IVIG and to be evaluated to make sure there's no viral reason for the headache, neck stiffness, and elevated WBC in CSF.

## 2022-02-09 NOTE — H&P (Signed)
History and Physical    Patient: Christine Brady TML:[id number] DOB: 11/06/1961 DOA: 02/09/2022 DOS: the patient was seen and examined on 02/09/2022 PCP: Ronita Hipps, MD  Patient coming from: Home  Chief Complaint:  Chief Complaint  Patient presents with   Numbness    HPI: Christine Brady is a 61 y.o. female with medical history significant of abdominal pain, osteoarthritis, atypical chest pain, history of bowel obstruction, cholesteatoma right ear, cochlear implant, chronic headaches, chronic ITP, hyperlipidemia, large ovary, GERD who was recently diagnosed with [name] syndrome 5 days ago and is now coming with his severe headache, numbness in her head and tongue after she woke up this morning.  She also stated that her tongue now has a blue-pink color.  A few days ago she was seen by neurology and had an elevated WBC in her spinal fluid.  She also complained of intermittent/transient left clavicular area severe pain without chest pain, palpitations or diaphoresis, however she feels mildly dyspneic while the pain is very intense.  Denied fever, chills, sore throat, rhinorrhea, productive cough, wheezing or hemoptysis.  No PND, orthopnea or pitting edema lower extremities.  No abdominal pain, diarrhea, melena or hematochezia.  She occasionally gets constipated.  Denies flank pain, dysuria or hematuria, but complains of urgency.  No polyuria, polydipsia, polyphagia or blurred vision.  ED course: Initial vital signs were temperature 98.1 F, pulse 54, respirations 16, BP 159/85 mmHg O2 sat 100% on room air.  The patient stated that she has a baseline bradycardic rate.  She received morphine 2 mg IVP in the emergency department.  Lab work: Urinalysis showed moderate leukocyte esterase and rare bacteria microscopic examination.  CBC with a white count of 8.4, hemoglobin 13.1 g/dL platelets 126.  BMP with a potassium of 3.5 mmol/L and a glucose of 100 mg/dL.  Imaging: CT lumbar spine done  2 weeks ago show abnormal lumbar nerve root thickening and nodularity that was new since previous.  CSF mets or lymphoma were suspected.  There was also multilevel DDD.  Please see images and full radiology report for further details.  Review of Systems: As mentioned in the history of present illness. All other systems reviewed and are negative. Past Medical History:  Diagnosis Date   Abdominal pain    Arthritis    difficulty walking   Atypical chest pain 11/11/2016   Bowel obstruction (HCC)    Cholesteatoma of right ear    Chronic headaches    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[place]) 11/11/2016   Cyst    back   Hearing aid worn    History of ITP    Hyperlipidemia 11/11/2016   Large ovary 07/09/2019   PONV (postoperative nausea and vomiting)    Reflux    Past Surgical History:  Procedure Laterality Date   CHOLECYSTECTOMY  2010   ESOPHAGOGASTRODUODENOSCOPY (EGD) WITH PROPOFOL N/A 06/12/2019   Procedure: ESOPHAGOGASTRODUODENOSCOPY (EGD) WITH PROPOFOL;  Surgeon: Carol Ada, MD;  Location: [facility];  Service: Endoscopy;  Laterality: N/A;   EXTERNAL EAR SURGERY  2009/2010   EXTERNAL EAR SURGERY  2010   right ear   KNEE ARTHROSCOPY     right   KNEE ARTHROSCOPY WITH MEDIAL PATELLAR FEMORAL LIGAMENT RECONSTRUCTION Right 10/05/2021   Procedure: right knee arthroscoy, debridement, lateral menisectomy, medial patellar femoral ligament reconstruction;  Surgeon: Meredith Pel, MD;  Location: [facility];  Service: Orthopedics;  Laterality: Right;   KNEE LIGAMENT RECONSTRUCTION     left   MASTOIDECTOMY  Right 01/19/2017   Procedure: RIGHT MODIFIED RADICAL MASTOIDECTOMY;  Surgeon: Vicie Mutters, MD;  Location: [facility];  Service: ENT;  Laterality: Right;   UPPER ESOPHAGEAL ENDOSCOPIC ULTRASOUND (EUS) N/A 06/12/2019   Procedure: UPPER ESOPHAGEAL ENDOSCOPIC ULTRASOUND (EUS);  Surgeon: Carol Ada, MD;  Location: [facility];  Service: Endoscopy;   Laterality: N/A;   WISDOM TOOTH EXTRACTION     Social History:  reports that she has never smoked. She has never used smokeless tobacco. She reports current alcohol use. She reports that she does not use drugs.  Allergies  Allergen Reactions   Lyrica [Pregabalin] Other (See Comments)    Caused the patient to feel "foggy-headed"   Nsaids Other (See Comments)    The patient was told to avoid NSAID(s) at this time- is taking Eliquis   Oxycodone Other (See Comments)    "Skin crawling"   Claritin-D [Loratadine-Pseudoephedrine Er] Rash   Codeine Rash   Hydrocodone-Acetaminophen Itching   Loratadine Rash   Family History  Problem Relation Age of Onset   Diabetes Mother    Asthma Mother    Diabetes type II Mother    Alcohol abuse Mother    Hypertension Mother    Diabetes Father    Hypertension Father    Kidney disease Father    Diabetes type II Father    Alcohol abuse Father    Crohn's disease Sister    COPD Sister    Thrombocytopenia Sister    Cerebral palsy Brother    Neuropathy Neg Hx    Prior to Admission medications   Medication Sig Start Date End Date Taking? Authorizing Provider  apixaban (ELIQUIS) 5 MG TABS tablet Take 2 tablet by mouth 2 times a day for 7 days then 1 tablet by mouth 2 times a day Patient taking differently: Take 5 mg by mouth in the morning and at bedtime. 01/22/22  Yes   Calcium Carbonate-Vitamin D3 600-400 MG-UNIT TABS Take 2 tablets by mouth daily.   Yes [provider]  celecoxib (CELEBREX) 100 MG capsule Take 1 capsule (100 mg total) by mouth 2 (two) times daily. Patient taking differently: Take 100 mg by mouth 2 (two) times daily as needed for mild pain. 12/01/21 12/01/22 Yes Magnant, Gerrianne Scale, PA-C  cyanocobalamin (DODEX) 1000 MCG/ML injection Inject 1 ml into the muscle once a month Patient taking differently: Inject 1,000 mcg into the skin every 30 (thirty) days. 01/15/22  Yes   dexamethasone (DECADRON) 4 MG tablet Take 10 tablets (40 mg  total) by mouth daily. Take for 4 days straight every month, take with food. Patient not taking: Reported on 02/09/2022 02/04/22   Melvenia Beam, MD  dicyclomine (BENTYL) 10 MG capsule Take 1 capsule (10 mg total) by mouth every 8 (eight) hours as needed for spasms. 05/06/21  Yes Nandigam, Venia Minks, MD  Hyoscyamine Sulfate SL 0.125 MG SUBL Place 0.125 mg under the tongue daily as needed (for stomach muscle cramping).   Yes [provider]  methocarbamol (ROBAXIN) 500 MG tablet Take 1 tablet by mouth every 12 hours as needed. Patient taking differently: Take 500 mg by mouth every 12 (twelve) hours as needed for muscle spasms. 02/01/22  Yes Magnant, Gerrianne Scale, PA-C  Multiple Vitamins-Calcium (ONE-A-DAY WOMENS FORMULA) TABS Take 1 tablet by mouth daily with breakfast.   Yes [provider]  omeprazole (PRILOSEC) 40 MG capsule Take 1 capsule (40 mg total) by mouth daily. Patient taking differently: Take 40 mg by mouth every 3 (three)  days. 04/06/21  Yes Nandigam, Venia Minks, MD  ondansetron (ZOFRAN) 4 MG tablet Take 1 tablet (4 mg total) by mouth every 8 (eight) hours as needed for nausea or vomiting. 08/19/21  Yes Meredith Pel, MD  Peppermint Oil (IBGARD) 90 MG CPCR Take 1 capsule by mouth daily.   Yes [provider]  polyethylene glycol powder (GLYCOLAX/MIRALAX) 17 GM/SCOOP powder Take 1 scoop mixed in 8 oz of fluid daily Patient taking differently: Take 17 g by mouth See admin instructions. Mix 17 grams of powder into 8 ounces of water and drink every morning 06/10/21  Yes   Probiotic Product (PROBIOTIC PO) Take 1 capsule by mouth in the morning.   Yes [provider]  rosuvastatin (CRESTOR) 5 MG tablet TAKE 1 TABLET BY MOUTH TWICE WEEKLY Patient taking differently: Take 5 mg by mouth See admin instructions. Take 5 mg by mouth at bedtime on Wednesdays and Saturdays only 01/08/21 02/09/22 Yes Ronita Hipps, MD  temazepam (RESTORIL) 15 MG capsule Take 1 capsule (15  mg total) by mouth at bedtime as needed for sleep. 11/18/21  Yes Meredith Pel, MD  traZODone (DESYREL) 50 MG tablet Take 50 mg by mouth at bedtime as needed for sleep.   Yes [provider]  Diclofenac Sodium 3 % GEL Apply topically to the affected area on the lower lip once daily. Patient not taking: Reported on 02/09/2022 09/01/21     HYDROcodone-acetaminophen (NORCO/VICODIN) 5-325 MG tablet Take 1 tablet by mouth every 12 (twelve) hours as needed for moderate pain. Patient not taking: Reported on 02/09/2022 01/15/22   Meredith Pel, MD  hydroquinone 4 % cream Apply externally twice a day for spot treatment for 2 months then take a one month break. Patient not taking: Reported on 02/09/2022 09/01/21     pregabalin (LYRICA) 100 MG capsule Take 1 capsule (100 mg total) by mouth 3 (three) times daily. Patient not taking: Reported on 02/09/2022 02/05/22   Marcial Pacas, MD  triamcinolone ointment (KENALOG) 0.1 % Apply topically to the affected area 2 times a day Patient not taking: Reported on 02/09/2022 12/21/21       Physical Exam: Vitals:   02/09/22 1012 02/09/22 1033 02/09/22 1145 02/09/22 1505  BP:  (!) 143/82 114/73 126/74  Pulse:  (!) 58 (!) 55 (!) 54  Resp:  17 14 15   Temp:      TempSrc:      SpO2:  99% 98% 98%  Weight: 63.5 kg     Height: 5\' 2"  (1.575 m)      Physical Exam Vitals and nursing note reviewed.  Constitutional:      Appearance: Normal appearance.  HENT:     Head: Normocephalic.     Mouth/Throat:     Mouth: Mucous membranes are moist.  Eyes:     General: No scleral icterus.    Pupils: Pupils are equal, round, and reactive to light.  Neck:     Vascular: No JVD.  Cardiovascular:     Rate and Rhythm: Normal rate and regular rhythm.  Pulmonary:     Effort: Pulmonary effort is normal.     Breath sounds: Normal breath sounds.  Abdominal:     General: There is no distension.     Palpations: Abdomen is soft.     Tenderness: There is no abdominal  tenderness.  Musculoskeletal:     Cervical back: Neck supple.     Right lower leg: No edema.     Left lower  leg: No edema.  Skin:    General: Skin is warm and dry.  Neurological:     General: No focal deficit present.     Mental Status: She is alert and oriented to person, place, and time.     Cranial Nerves: Cranial nerves 2-12 are intact.     Sensory: Sensory deficit present.     Motor: Weakness present.  Psychiatric:        Mood and Affect: Mood normal.        Behavior: Behavior normal.   Data Reviewed:  There are no new results to review at this time.  Assessment and Plan: Principal Problem:   Acute inflammatory demyelinating polyneuropathy ([place]) Admit to telemetry/inpatient. Neurochecks every 4 hours. Monitor O2 sat, respiratory and CV status q 4 hr. Immunoglobulin may worsen thrombosis. Will transfer to [facility] for possible plasma exchange therapy. MRI imaging has been deferred due to cochlear implant. I will follow-up on pending neurology consult.  Active Problems:   Hyperlipidemia Currently not taking rosuvastatin.    GERD (gastroesophageal reflux disease) Resume PPI.    Idiopathic thrombocytopenia (HCC) Monitor platelet count.    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 twice daily.   Advance Care Planning:   Code Status: Full Code   Consults: Dr. Donnetta Simpers (neuro hospitalist team).  Family Communication:   Severity of Illness: The appropriate patient status for this patient is INPATIENT. Inpatient status is judged to be reasonable and necessary in order to provide the required intensity of service to ensure the patient's safety. The patient's presenting symptoms, physical exam findings, and initial radiographic and laboratory data in the context of their chronic comorbidities is felt to place them at high risk for further clinical deterioration. Furthermore, it is not anticipated that the patient will be medically  stable for discharge from the hospital within 2 midnights of admission.   * I certify that at the point of admission it is my clinical judgment that the patient will require inpatient hospital care spanning beyond 2 midnights from the point of admission due to high intensity of service, high risk for further deterioration and high frequency of surveillance required.*  Author: Reubin Milan, MD 02/09/2022 5:00 PM  For on call review [email or web address].   This document was prepared using Dragon voice recognition software and may contain some unintended transcription errors.

## 2022-02-09 NOTE — Telephone Encounter (Signed)
Spoke with patient, also Dr Jaynee Eagles aware of call, recommend ER now for admission. Needs IVIG. Patient states she feels strange, her head feels strange/numb, tongue feels numb. She denies breathing problems. She is amenable to going to the ER. She works at [employer] and is already there. Pt aware she needs IVIG for CIDP but also given her neck stiffness, headache, and elevated WBC in CSF, she also needs viral etiology ruled out. Pt aware we are attempting to get IVIG outpatient but she needs it more urgently in the hospital.   Dr Jaynee Eagles and neuro-hospitalist, Dr Lorrin Goodell, copied on this message.

## 2022-02-09 NOTE — ED Notes (Signed)
Patient attempted an MRI previously but due to implanted cochlear implant and the pain she experienced when the MRI magnetic field pulled the implant, was unable to do scan.

## 2022-02-09 NOTE — Consult Note (Addendum)
NEUROLOGY CONSULTATION NOTE   Date of service: February 09, 2022 Patient Name: Christine Brady MRN:  [id number] DOB:  [birth year] Reason for consult: "numbness and concern for GBS" Requesting Provider: Reubin Milan, MD _ _ _   _ __   _ __ _ _  __ __   _ __   __ _  History of Present Illness  Christine Brady is a 61 y.o. female with PMH significant for chronic IPT, HLD, arthritis who presents with BL leg numbness.  She had R knee surgery in October, 3 weeks later she woke up one day and had numbness in the bottom of both of her feet. This remained this way with no improvement or worsening. Then in mid february the numbness starting moving up and into the back of her calves BL. Now it is up to her waist. She feels off balance and has to use a cane.  In December she also developed petechial rash on BL legs. 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  She has trouble with urination since mid february. She got a UTI and she has never had a UTI in her entire life. She can feel when her bladder is full somewhat. Urine only dribbles despite her best attempt. She cant completely empty her bladder.  She cant feel herself when she wipes after a bowel movement.  Today, she woke up and her entire face is numb and she feels like there is pressure behind her eyes with some throbbing headache. She spoke with her outpatient neurology team and was advised to come here.  She has had workup outpatient for her numbness. She is unable to get MRI due to cochlear implant and any attempts at MRI where she has to be put into the scanner, even of her spine have caused issues with her implant. She had LP on 2/23 which was notable for WBC of 19, elevated protein to 109 with normal glucose. VDRL non reactive, oligoclonal bands are pending. She had CT myelogram of her lumbar spine on 2/15 which demonstrated abnormal lumbar nerve root  thickening and nodularity.    ROS   A detailed 10 point review of system was completed and was negative except above.  Past History   Past Medical History:  Diagnosis Date   Abdominal pain    Arthritis    difficulty walking   Atypical chest pain 11/11/2016   Bowel obstruction (HCC)    Cholesteatoma of right ear    Chronic headaches    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[place]) 11/11/2016   Cyst    back   Hearing aid worn    History of ITP    Hyperlipidemia 11/11/2016   Large ovary 07/09/2019   PONV (postoperative nausea and vomiting)    Reflux    Past Surgical History:  Procedure Laterality Date   CHOLECYSTECTOMY  2010   ESOPHAGOGASTRODUODENOSCOPY (EGD) WITH PROPOFOL N/A 06/12/2019   Procedure: ESOPHAGOGASTRODUODENOSCOPY (EGD) WITH PROPOFOL;  Surgeon: Carol Ada, MD;  Location: [facility];  Service: Endoscopy;  Laterality: N/A;   EXTERNAL EAR SURGERY  2009/2010   EXTERNAL EAR SURGERY  2010   right ear   KNEE ARTHROSCOPY     right   KNEE ARTHROSCOPY WITH MEDIAL PATELLAR FEMORAL LIGAMENT RECONSTRUCTION Right 10/05/2021   Procedure: right knee arthroscoy, debridement, lateral menisectomy, medial patellar femoral ligament reconstruction;  Surgeon: Meredith Pel, MD;  Location: [place];  Service: Orthopedics;  Laterality: Right;   KNEE LIGAMENT RECONSTRUCTION     left   MASTOIDECTOMY Right 01/19/2017   Procedure: RIGHT MODIFIED RADICAL MASTOIDECTOMY;  Surgeon: Vicie Mutters, MD;  Location: [place];  Service: ENT;  Laterality: Right;   UPPER ESOPHAGEAL ENDOSCOPIC ULTRASOUND (EUS) N/A 06/12/2019   Procedure: UPPER ESOPHAGEAL ENDOSCOPIC ULTRASOUND (EUS);  Surgeon: Carol Ada, MD;  Location: [facility];  Service: Endoscopy;  Laterality: N/A;   WISDOM TOOTH EXTRACTION     Family History  Problem Relation Age of Onset   Diabetes Mother    Asthma Mother    Diabetes type II Mother    Alcohol abuse Mother    Hypertension Mother     Diabetes Father    Hypertension Father    Kidney disease Father    Diabetes type II Father    Alcohol abuse Father    Crohn's disease Sister    COPD Sister    Thrombocytopenia Sister    Cerebral palsy Brother    Neuropathy Neg Hx    Social History   Socioeconomic History   Marital status: Single    Spouse name: Not on file   Number of children: Not on file   Years of education: Not on file   Highest education level: Not on file  Occupational History   Not on file  Tobacco Use   Smoking status: Never   Smokeless tobacco: Never  Vaping Use   Vaping Use: Never used  Substance and Sexual Activity   Alcohol use: Yes    Comment: occas   Drug use: No   Sexual activity: Not on file  Other Topics Concern   Not on file  Social History Narrative   Not on file   Social Determinants of Health   Financial Resource Strain: Not on file  Food Insecurity: Not on file  Transportation Needs: Not on file  Physical Activity: Not on file  Stress: Not on file  Social Connections: Not on file   Allergies  Allergen Reactions   Lyrica [Pregabalin] Other (See Comments)    Caused the patient to feel "foggy-headed"   Nsaids Other (See Comments)    The patient was told to avoid NSAID(s) at this time- is taking Eliquis   Oxycodone Other (See Comments)    "Skin crawling"   Claritin-D [Loratadine-Pseudoephedrine Er] Rash   Codeine Rash   Hydrocodone-Acetaminophen Itching   Loratadine Rash    Medications  (Not in a hospital admission)    Vitals   Vitals:   02/09/22 1012 02/09/22 1033 02/09/22 1145 02/09/22 1505  BP:  (!) 143/82 114/73 126/74  Pulse:  (!) 58 (!) 55 (!) 54  Resp:  17 14 15   Temp:      TempSrc:      SpO2:  99% 98% 98%  Weight: 63.5 kg     Height: 5\' 2"  (1.575 m)        Body mass index is 25.61 kg/m.  Physical Exam   General: Laying comfortably in bed; in no acute distress.  HENT: Normal oropharynx and mucosa. Normal external appearance of ears and  nose.  Neck: Supple, no pain or tenderness  CV: No JVD. No peripheral edema.  Pulmonary: Symmetric Chest rise. Normal respiratory effort.  Abdomen: Soft to touch, non-tender.  Ext: No cyanosis, edema, or deformity  Skin: No rash. Normal palpation of skin.   Musculoskeletal: Normal digits and nails by inspection. No clubbing.   Neurologic Examination  Mental status/Cognition: Alert, oriented to self, place, month and year, good attention.  Speech/language: Fluent, comprehension intact, object naming intact, repetition intact.  Cranial nerves:   CN II Pupils equal and reactive to light, no VF deficits    CN III,IV,VI EOM intact, no gaze preference or deviation, no nystagmus    CN V normal sensation in V1, V2, and V3 segments bilaterally    CN VII no asymmetry, no nasolabial fold flattening    CN VIII normal hearing to speech    CN IX & X normal palatal elevation, no uvular deviation    CN XI 5/5 head turn and 5/5 shoulder shrug bilaterally    CN XII midline tongue protrusion    Motor:  Muscle bulk: normal, tone normal, pronator drift none tremor none Mvmt Root Nerve  Muscle Right Left Comments  SA C5/6 Ax Deltoid 5 5   EF C5/6 Mc Biceps 5 5   EE C6/7/8 Rad Triceps 5 5   WF C6/7 Med FCR     WE C7/8 PIN ECU     F Ab C8/T1 U ADM/FDI 5 5   HF L1/2/3 Fem Illopsoas 5 5   KE L2/3/4 Fem Quad 5 5   DF L4/5 D Peron Tib Ant 5 5   PF S1/2 Tibial Grc/Sol 5 5    Reflexes:  Right Left Comments  Pectoralis      Biceps (C5/6) 2 2   Brachioradialis (C5/6) 2 2    Triceps (C6/7) 2 2    Patellar (L3/4) 2 2    Achilles (S1)      Hoffman      Plantar     Jaw jerk    Sensation:  Light touch Decreased in entire face. Intact in her arms and hands but then decreased in her legs. No clear sensory level.   Pin prick Dull in her arms and dull in her calves and her feet BL   Temperature    Vibration Intact throughout  Proprioception    Coordination/Complex Motor:  - Finger to Nose intact BL -  Heel to shin intact BL - Rapid alternating movement are normal - Gait: Deferred.  Labs   CBC:  Recent Labs  Lab 02/09/22 1104  WBC 8.4  NEUTROABS 4.2  HGB 13.1  HCT 38.0  MCV 94.5  PLT 126*    Basic Metabolic Panel:  Lab Results  Component Value Date   NA 136 02/09/2022   K 3.5 02/09/2022   CO2 29 02/09/2022   GLUCOSE 100 (H) 02/09/2022   BUN 19 02/09/2022   CREATININE 0.68 02/09/2022   CALCIUM 9.5 02/09/2022   GFRNONAA >60 02/09/2022   GFRAA >60 07/12/2019   Lipid Panel: No results found for: LDLCALC HgbA1c:  Lab Results  Component Value Date   HGBA1C 5.4 02/04/2022   Urine Drug Screen: No results found for: LABOPIA, COCAINSCRNUR, LABBENZ, AMPHETMU, THCU, LABBARB  Alcohol Level No results found for: [facility]  CT Head: pending  CT Venogram head: pending  LP studies: WBC: 19 Protein: 109 Glucose: 55 CSF Cx: negative.  Impression   CECILIA VANCLEVE is a 61 y.o. female with PMH significant for chronic IPT, HLD, arthritis and s/p recent R Knee replacement, DVTs in L popliteal vein in Dec 2022 and on Eliquis who presents with BL leg numbness that started in nov 2022 in her feet and was stable until February and now has numbness upto her waist along with numbness of her entire face. She now has to use  a cane to walk. Outpatient workup with unable to get MRIs due to issues with her cochlear implant but CT Lumbar myelogram with abnormal lumbar nerve root thickening and nodularity. Outpatient LP with pleocytosis with WBC of 19, elevated protein to 109.  I suspect that her current episode is either AIDP or she could potentially have had CIDP back in nov with acute exacerbation.  Her CSF findings could possibly indicate a viral polyneuropathy including [place], Parvovirus B19, EBV, CMV. Other infectious polyneuropathies including Syphilis, HIV, Lyme. She denies any travel abroad our out of state over the last few months. No obvious ticks on my examination.  She has ITP  and thus is at risk for other autoimmune disorders which can contribute to piolyneuropathy including SLE, Sjogrens, Rheumatoid.  Overall, highest suspicion for potential AIDP vs CIDP at this time specially given the noted inflammation and thickening of the lumbar nerve roots on CT myelogram of her Lumbar spine. We will work her up for differential listed above but start with PLEX at this time.  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  Recommendations  - Given her known hx of DVTs, my concern is that IVIG would increase her risk of thromboembolic events. This is partially due to the fact that IVIG is viscous and known to increase blood viscosity specially in venous circulation but also due to procoagulant proteins present in IVIG. I spoke with patient and discussed the risks and benefits of PLEX along with alternatives. At this time, PLEX is more appropriate for her and she agreed to PLEX. She will need to come [facility] as PLEX is not available at [facility]. I spoke with the hospitalist team. Will need to consult IR for temp HD Cath placement and will do 5 session of PLEX every other day.  - In addition will get workup for Viral, potential Autoimmune, nutritional etiologies for her polyneuropathy and follow up on Oligoclonal bands. - Will get CTH with and without contrast along with a CT Venogram given her headache with some pressure like features. With her DVT, want to make sure that we evaluate for sinus venous thrombosis. _________________________________________________________________  Plan discussed with Dr. Olevia Bowens.  Thank you for the opportunity to take part in the care of this patient. If you have any further questions, please contact the neurology consultation attending.  Signed,  [place] Pager Number [phone] _ _ _   _ __   _ __ _ _  __ __   _ __   __ _

## 2022-02-09 NOTE — Telephone Encounter (Signed)
Pt called wanting to know if the RN can call her to discuss the numbness she is experiencing on her face and tongue. Pt states she prefers to speak to the RN before her NCV/EMG test this afternoon. Please advise.

## 2022-02-09 NOTE — Telephone Encounter (Signed)
Received approval from [place] for Gammagard 10% IVIG through 02/08/23.

## 2022-02-09 NOTE — ED Triage Notes (Addendum)
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 Patient is c/o numbness in her head and tongue when she woke this AM. Patient's tongue has a blue tint in color. Patient states that she was also told by her neurologist that she had an elevated WBC count in her spinal fluid.  Patient states last night she had neck pain and stiffness with a headache last night.

## 2022-02-10 ENCOUNTER — Inpatient Hospital Stay (HOSPITAL_COMMUNITY): Payer: 59

## 2022-02-10 HISTORY — PX: IR US GUIDE VASC ACCESS RIGHT: IMG2390

## 2022-02-10 HISTORY — PX: IR FLUORO GUIDE CV LINE RIGHT: IMG2283

## 2022-02-10 LAB — BASIC METABOLIC PANEL
Anion gap: 6 (ref 5–15)
BUN: 8 mg/dL (ref 6–20)
CO2: 29 mmol/L (ref 22–32)
Calcium: 9.4 mg/dL (ref 8.9–10.3)
Chloride: 100 mmol/L (ref 98–111)
Creatinine, Ser: 0.78 mg/dL (ref 0.44–1.00)
GFR, Estimated: >60 mL/min (ref >60)
Glucose, Bld: 122 mg/dL — ABNORMAL HIGH (ref 70–99)
Potassium: 5.1 mmol/L (ref 3.5–5.1)
Sodium: 135 mmol/L (ref 135–145)

## 2022-02-10 LAB — RPR: RPR Ser Ql: NONREACTIVE

## 2022-02-10 MED ORDER — DIPHENHYDRAMINE HCL 25 MG PO CAPS: 25.0000 mg | ORAL_CAPSULE | Freq: Four times a day (QID) | ORAL | Status: DC | PRN

## 2022-02-10 MED ORDER — LIDOCAINE-EPINEPHRINE 1 %-1:100000 IJ SOLN
INTRAMUSCULAR | Status: AC
  Filled 2022-02-10: qty 1

## 2022-02-10 MED ORDER — ACD FORMULA A 0.73-2.45-2.2 GM/100ML VI SOLN
1000.0000 mL | Status: DC
  Administered 2022-02-10: 1000 mL
  Filled 2022-02-10 (×5): qty 1000

## 2022-02-10 MED ORDER — ACETAMINOPHEN 325 MG PO TABS
650.0000 mg | ORAL_TABLET | ORAL | Status: DC | PRN
  Filled 2022-02-10: qty 2

## 2022-02-10 MED ORDER — SODIUM CHLORIDE 0.9 % IV SOLN
INTRAVENOUS | Status: AC
  Filled 2022-02-10 (×3): qty 200

## 2022-02-10 MED ORDER — HEPARIN SODIUM (PORCINE) 1000 UNIT/ML IJ SOLN
INTRAMUSCULAR | Status: AC
  Filled 2022-02-10: qty 10

## 2022-02-10 MED ORDER — CHLORHEXIDINE GLUCONATE CLOTH 2 % EX PADS
6.0000 | MEDICATED_PAD | Freq: Every day | CUTANEOUS | Status: DC
  Administered 2022-02-11 – 2022-02-17 (×5): 6 via TOPICAL

## 2022-02-10 MED ORDER — HEPARIN SODIUM (PORCINE) 1000 UNIT/ML IJ SOLN
1000.0000 [IU] | Freq: Once | INTRAMUSCULAR | Status: AC
  Administered 2022-02-13: 1000 [IU]
  Filled 2022-02-10: qty 1

## 2022-02-10 MED ORDER — IOHEXOL 350 MG/ML SOLN
100.0000 mL | Freq: Once | INTRAVENOUS | Status: AC | PRN
  Administered 2022-02-10: 75 mL via INTRAVENOUS

## 2022-02-10 MED ORDER — CALCIUM GLUCONATE-NACL 2-0.675 GM/100ML-% IV SOLN
2.0000 g | Freq: Once | INTRAVENOUS | Status: AC
  Administered 2022-02-10: 2000 mg via INTRAVENOUS
  Filled 2022-02-10 (×2): qty 100

## 2022-02-10 MED ORDER — CALCIUM CARBONATE ANTACID 500 MG PO CHEW
2.0000 | CHEWABLE_TABLET | ORAL | Status: AC
  Administered 2022-02-10 (×2): 400 mg via ORAL
  Filled 2022-02-10 (×2): qty 2

## 2022-02-10 MED ORDER — LIDOCAINE-EPINEPHRINE 2 %-1:100000 IJ SOLN
INTRAMUSCULAR | Status: DC | PRN
  Administered 2022-02-10: 10 mL

## 2022-02-10 NOTE — Significant Event (Signed)
Rapid Response Event Note  ? ?Reason for Call :  ?Bradycardia-30s, hypotension-50s ? ?Pt in HD receiving plasma exchange. When episode happened, HD RN took pt off treatment(pt only had 13 min left).  ? ?Initial Focused Assessment:  ?Pt lying in bed with eyes open, in no distress. Pt c/o N/V that is improving, denies CP/SOB. Lungs clear t/o. Skin warm, pale.  ? ?HR-53(SR), SBP-90s, RR-18, SpO2-98 on RA ? ? ?Interventions:  ?No RRT interventions needed at this time ? ?Plan of Care:  ?Pt HR and BP are better once treatment off. Pt says she is feeling better. Continue to monitor pt closely. Call RRT if further assistance needed. Plan of care discussed with Dr. Joen Laura MD). ? ? ?Event Summary:  ? ?MD Notified: Dr. Theda Sers ?Call CVUD:[phone] ?Arrival OOIL:[id number] ?End Time:1950 ? ?Dillard Essex, RN ?

## 2022-02-10 NOTE — Progress Notes (Signed)
Good evening. Patient was 3/4 done with plasma exchange when she began to feel nauseous. Heart rate dropped to 37 from the 60's and BP was 54 systolic from low 38'V. Notified rapid response for assistance. Ended plasma exchange treatment. Patient heart rate came up and BP improved. MD Lorrin Goodell aware ?

## 2022-02-10 NOTE — Procedures (Signed)
Interventional Radiology Procedure Note ? ?Procedure: Temporary hemodialysis catheter placement ? ?Findings: Please refer to procedural dictation for full description. Right IJ, 20 cm Trialysis.  Tip in right atrium. ? ?Complications:  None immediate ? ?Estimated Blood Loss: < 5 mL ? ?Recommendations: ?Catheter ready for immediate use. ? ? ?Ruthann Cancer, MD ?Pager: [phone] ? ? ?

## 2022-02-10 NOTE — Progress Notes (Signed)
PROGRESS NOTE    Christine Brady  TIR:[id number] DOB: [birth year] DOA: 02/09/2022 PCP: Ronita Hipps, MD    Brief Narrative:  61 year old with history of atypical chest pain, bowel obstruction, DVT left leg, cochlear implant, chronic ITP, GERD who was recently diagnosed with GB syndrome 5 days ago with lumbar puncture presented to the emergency room with headache, numbness in her head and tongue, chronic numbness of the feet and calf.  Hemodynamically stable in the ER.  CT lumbar spine 2 weeks ago with abnormal lumbar nerve root thickening and nodularity, patient could not have MRI because of cochlear implant.  Admitted with neurology consultation.   Assessment & Plan:   Acute inflammatory demyelinating polyneuropathy: CT head with without contrast essentially normal. CT venogram of the head without any evidence of dural sinus thrombosis. Agree with admission given severity of symptoms.  Neurology on board.  MRI not possible.  Currently hemodynamically stable. As per neurology plan, temporary dialysis catheter and plasma exchange x5.  GERD: On PPI  ITP: Platelets stable.  DVT, postoperative: On Eliquis.  On hold for CVC placement.  Will resume after procedure.   DVT prophylaxis:   Eliquis.   Code Status: Full code Family Communication: None at the bedside Disposition Plan: Status is: Inpatient Remains inpatient appropriate because: Inpatient procedures needed.  Planned for plasma exchange.             Consultants:  Neurology  Procedures:  Planned temporary catheter placement  Antimicrobials:  None   Subjective: Patient seen and examined.  Feels about the same.  She has numbness of tongue, chronic numbness of the feet.  No new events.  Objective: Vitals:   02/09/22 2027 02/09/22 2028 02/09/22 2331 02/10/22 0316  BP:  103/66 101/63 (!) 92/54  Pulse:  61 96 (!) 57  Resp:  17 16 18   Temp:  98.2 F (36.8 C) 97.9 F (36.6 C) 98.6 F (37 C)  TempSrc:   Oral Oral Oral  SpO2:  97% 97% 100%  Weight: 61.9 kg     Height: 5\' 2"  (1.575 m)       Intake/Output Summary (Last 24 hours) at 02/10/2022 1206 Last data filed at 02/10/2022 0400 Gross per 24 hour  Intake 350 ml  Output --  Net 350 ml   Filed Weights   02/09/22 1012 02/09/22 2027  Weight: 63.5 kg 61.9 kg    Examination:  General exam: Appears calm and comfortable  Respiratory system: Clear to auscultation. Respiratory effort normal. Cardiovascular system: S1 & S2 heard, RRR. No JVD, murmurs, rubs, gallops or clicks. No pedal edema. Gastrointestinal system: Abdomen is nondistended, soft and nontender. No organomegaly or masses felt. Normal bowel sounds heard. Central nervous system: Alert and oriented. No focal neurological deficits. Extremities: Symmetric 5 x 5 power.   Data Reviewed: I have personally reviewed following labs and imaging studies  CBC: Recent Labs  Lab 02/09/22 1104  WBC 8.4  NEUTROABS 4.2  HGB 13.1  HCT 38.0  MCV 94.5  PLT 676*   Basic Metabolic Panel: Recent Labs  Lab 02/09/22 1104  NA 136  K 3.5  CL 100  CO2 29  GLUCOSE 100*  BUN 19  CREATININE 0.68  CALCIUM 9.5  MG 2.1   GFR: Estimated Creatinine Clearance: 65.5 mL/min (by C-G formula based on SCr of 0.68 mg/dL). Liver Function Tests: No results for input(s): AST, ALT, ALKPHOS, BILITOT, PROT, ALBUMIN in the last 168 hours. No results for input(s): LIPASE, AMYLASE in the last 168  hours. No results for input(s): AMMONIA in the last 168 hours. Coagulation Profile: No results for input(s): INR, PROTIME in the last 168 hours. Cardiac Enzymes: No results for input(s): CKTOTAL, CKMB, CKMBINDEX, TROPONINI in the last 168 hours. BNP (last 3 results) No results for input(s): PROBNP in the last 8760 hours. HbA1C: No results for input(s): HGBA1C in the last 72 hours. CBG: No results for input(s): GLUCAP in the last 168 hours. Lipid Profile: No results for input(s): CHOL, HDL, LDLCALC, TRIG,  CHOLHDL, LDLDIRECT in the last 72 hours. Thyroid Function Tests: No results for input(s): TSH, T4TOTAL, FREET4, T3FREE, THYROIDAB in the last 72 hours. Anemia Panel: Recent Labs    02/09/22 2108  FOLATE 13.7   Sepsis Labs: No results for input(s): PROCALCITON, LATICACIDVEN in the last 168 hours.  Recent Results (from the past 240 hour(s))  CSF culture     Status: None   Collection Time: 02/04/22  1:18 PM   Specimen: Lumbar Puncture; Cerebrospinal Fluid  Result Value Ref Range Status   MICRO NUMBER: [id number]  Final   SPECIMEN QUALITY: Adequate  Final   Source CEREBROSPINAL FLUID  Final   STATUS: FINAL  Final   GRAM STAIN:   Final    Gram stain prepared by cytospin No organisms or white blood cells seen   Result: No Growth  Final  Fungus culture w smear     Status: None (Preliminary result)   Collection Time: 02/04/22  1:18 PM   Specimen: Lumbar Puncture; Cerebrospinal Fluid  Result Value Ref Range Status   MICRO NUMBER: [id number]  Preliminary   SPECIMEN QUALITY: Adequate  Preliminary   Source: CEREBROSPINAL FLUID  Preliminary   STATUS: PRELIMINARY  Preliminary   SMEAR: No fungal elements seen.  Preliminary   CULTURE:   Preliminary    No fungi isolated to date. Culture is examined weekly for a total of 28 days incubation. A change in status will result in an updated culture report.  Resp Panel by RT-PCR (Flu A&B, Covid) Nasopharyngeal Swab     Status: None   Collection Time: 02/09/22 11:04 AM   Specimen: Nasopharyngeal Swab; Nasopharyngeal(NP) swabs in vial transport medium  Result Value Ref Range Status   SARS Coronavirus 2 by RT PCR NEGATIVE NEGATIVE Final    Comment: (NOTE) SARS-CoV-2 target nucleic acids are NOT DETECTED.  The SARS-CoV-2 RNA is generally detectable in upper respiratory specimens during the acute phase of infection. The lowest concentration of SARS-CoV-2 viral copies this assay can detect is 138 copies/mL. A negative result does not preclude  SARS-Cov-2 infection and should not be used as the sole basis for treatment or other patient management decisions. A negative result may occur with  improper specimen collection/handling, submission of specimen other than nasopharyngeal swab, presence of viral mutation(s) within the areas targeted by this assay, and inadequate number of viral copies(<138 copies/mL). A negative result must be combined with clinical observations, patient history, and epidemiological information. The expected result is Negative.  Fact Sheet for Patients:  EntrepreneurPulse.com.au  Fact Sheet for Healthcare Providers:  IncredibleEmployment.be  This test is no t yet approved or cleared by the [place] FDA and  has been authorized for detection and/or diagnosis of SARS-CoV-2 by FDA under an Emergency Use Authorization (EUA). This EUA will remain  in effect (meaning this test can be used) for the duration of the COVID-19 declaration under Section 564(b)(1) of the Act, 21 U.S.C.section 360bbb-3(b)(1), unless the authorization is terminated  or revoked sooner.  Influenza A by PCR NEGATIVE NEGATIVE Final   Influenza B by PCR NEGATIVE NEGATIVE Final    Comment: (NOTE) The Xpert Xpress SARS-CoV-2/FLU/RSV plus assay is intended as an aid in the diagnosis of influenza from Nasopharyngeal swab specimens and should not be used as a sole basis for treatment. Nasal washings and aspirates are unacceptable for Xpert Xpress SARS-CoV-2/FLU/RSV testing.  Fact Sheet for Patients: EntrepreneurPulse.com.au  Fact Sheet for Healthcare Providers: IncredibleEmployment.be  This test is not yet approved or cleared by the [place] FDA and has been authorized for detection and/or diagnosis of SARS-CoV-2 by FDA under an Emergency Use Authorization (EUA). This EUA will remain in effect (meaning this test can be used) for the duration of  the COVID-19 declaration under Section 564(b)(1) of the Act, 21 U.S.C. section 360bbb-3(b)(1), unless the authorization is terminated or revoked.  Performed at [facility], [place] [address], [place], [place] [address]          Radiology Studies: CT HEAD W & WO CONTRAST (5MM)  Result Date: 02/10/2022 CLINICAL DATA:  Dural venous sinus thrombosis suspected EXAM: CT HEAD WITHOUT AND WITH CONTRAST CT VENOGRAPHY OF THE HEAD TECHNIQUE: Contiguous axial images were obtained from the base of the skull through the vertex without and with intravenous contrast. Venographic phase images of the brain were obtained following the administration of intravenous contrast. Multiplanar reformats and maximum intensity projections were generated. RADIATION DOSE REDUCTION: This exam was performed according to the departmental dose-optimization program which includes automated exposure control, adjustment of the mA and/or kV according to patient size and/or use of iterative reconstruction technique. CONTRAST:  31mL OMNIPAQUE IOHEXOL 350 MG/ML SOLN COMPARISON:  None. FINDINGS: CT HEAD Streak artifact from right cochlear implant. Findings below within this limitation. Brain: There is no acute intracranial hemorrhage, mass, mass effect, or edema. No abnormal enhancement. Gray-white differentiation is preserved. There is no extra-axial fluid collection. Ventricles and sulci are within normal limits in size and configuration. Vascular: No hyperdense vessel or unexpected calcification. Skull: Calvarium is unremarkable. Sinuses/Orbits: No acute finding. Other: There are no internal electrodes associated with cochlear implant. Prior canal wall up mastoidectomy. Nonspecific opacification of the mastoidectomy bowl extending into the middle ear cleft. Probable left ossicle implant. CTV HEAD Superior sagittal sinus, straight sinus, vein of Galen, and internal cerebral veins are patent. Left transverse and sigmoid  sinuses are patent. The right transverse and sigmoid sinuses are partially obscured by artifact. Visualized portions are patent. IMPRESSION: No acute intracranial abnormality within limitation of streak artifact from cochlear implant apparatus. Of note, there are no apparent electrodes associated with the implant. No evidence of dural sinus thrombosis within limitation of streak artifact. Electronically Signed   By: Macy Mis M.D.   On: 02/10/2022 08:46   CT VENOGRAM HEAD  Result Date: 02/10/2022 CLINICAL DATA:  Dural venous sinus thrombosis suspected EXAM: CT HEAD WITHOUT AND WITH CONTRAST CT VENOGRAPHY OF THE HEAD TECHNIQUE: Contiguous axial images were obtained from the base of the skull through the vertex without and with intravenous contrast. Venographic phase images of the brain were obtained following the administration of intravenous contrast. Multiplanar reformats and maximum intensity projections were generated. RADIATION DOSE REDUCTION: This exam was performed according to the departmental dose-optimization program which includes automated exposure control, adjustment of the mA and/or kV according to patient size and/or use of iterative reconstruction technique. CONTRAST:  6mL OMNIPAQUE IOHEXOL 350 MG/ML SOLN COMPARISON:  None. FINDINGS: CT HEAD Streak artifact from right cochlear implant. Findings below  within this limitation. Brain: There is no acute intracranial hemorrhage, mass, mass effect, or edema. No abnormal enhancement. Gray-white differentiation is preserved. There is no extra-axial fluid collection. Ventricles and sulci are within normal limits in size and configuration. Vascular: No hyperdense vessel or unexpected calcification. Skull: Calvarium is unremarkable. Sinuses/Orbits: No acute finding. Other: There are no internal electrodes associated with cochlear implant. Prior canal wall up mastoidectomy. Nonspecific opacification of the mastoidectomy bowl extending into the middle ear  cleft. Probable left ossicle implant. CTV HEAD Superior sagittal sinus, straight sinus, vein of Galen, and internal cerebral veins are patent. Left transverse and sigmoid sinuses are patent. The right transverse and sigmoid sinuses are partially obscured by artifact. Visualized portions are patent. IMPRESSION: No acute intracranial abnormality within limitation of streak artifact from cochlear implant apparatus. Of note, there are no apparent electrodes associated with the implant. No evidence of dural sinus thrombosis within limitation of streak artifact. Electronically Signed   By: Macy Mis M.D.   On: 02/10/2022 08:46        Scheduled Meds:  calcium-vitamin D  2 tablet Oral Daily   pantoprazole  40 mg Oral Daily   [START ON 02/18/2022] thiamine injection  100 mg Intravenous Daily   Continuous Infusions:  lactated ringers 50 mL/hr at 02/09/22 2100   thiamine injection 500 mg (02/10/22 7579)   Followed by   Derrill Memo ON 02/12/2022] thiamine injection       LOS: 1 day    Time spent: 32 minutes    Barb Merino, MD [facility] Pager [phone]

## 2022-02-10 NOTE — Progress Notes (Signed)
?  Transition of Care (TOC) Screening Note ? ? ?Patient Details  ?Name: Christine Brady ?Date of Birth: 1961-01-21 ? ? ?Transition of Care (TOC) CM/SW Contact:    ?Pollie Friar, RN ?Phone Number: ?02/10/2022, 3:20 PM ? ? ? ?Transition of Care Department [facility]) has reviewed patient. We will continue to monitor patient advancement through interdisciplinary progression rounds. If new patient transition needs arise, please place a TOC consult. ?  ?

## 2022-02-11 LAB — ANA W/REFLEX IF POSITIVE: Anti Nuclear Antibody (ANA): NEGATIVE

## 2022-02-11 LAB — RHEUMATOID FACTOR: Rheumatoid fact SerPl-aCnc: <10 IU/mL (ref <14.0)

## 2022-02-11 LAB — MISC LABCORP TEST (SEND OUT): Labcorp test code: 505535

## 2022-02-11 LAB — CMV IGM: CMV IgM: <30 AU/mL (ref 0.0–29.9)

## 2022-02-11 LAB — ANTIEXTRACTABLE NUCLEAR AG
ENA SM Ab Ser-aCnc: <0.2 AI (ref 0.0–0.9)
Ribonucleic Protein: 1 AI — ABNORMAL HIGH (ref 0.0–0.9)

## 2022-02-11 LAB — PARVOVIRUS B19 ANTIBODY, IGG AND IGM
Parovirus B19 IgG Abs: 3.3 index — ABNORMAL HIGH (ref 0.0–0.8)
Parovirus B19 IgM Abs: 0.2 index (ref 0.0–0.8)

## 2022-02-11 LAB — ANTI-DNA ANTIBODY, DOUBLE-STRANDED: ds DNA Ab: <1 IU/mL (ref 0–9)

## 2022-02-11 LAB — EPSTEIN-BARR VIRUS VCA, IGG: EBV VCA IgG: >600 U/mL — ABNORMAL HIGH (ref 0.0–17.9)

## 2022-02-11 LAB — VITAMIN B1: Vitamin B1 (Thiamine): 123.4 nmol/L (ref 66.5–200.0)

## 2022-02-11 LAB — LYME DISEASE SEROLOGY W/REFLEX: Lyme Total Antibody EIA: NEGATIVE

## 2022-02-11 LAB — EPSTEIN-BARR VIRUS VCA, IGM: EBV VCA IgM: <36 U/mL (ref 0.0–35.9)

## 2022-02-11 MED ORDER — LACTATED RINGERS IV BOLUS: 1000.0000 mL | Freq: Once | INTRAVENOUS | Status: DC

## 2022-02-11 MED ORDER — LACTATED RINGERS IV BOLUS
1000.0000 mL | Freq: Once | INTRAVENOUS | Status: AC
  Administered 2022-02-11: 1000 mL via INTRAVENOUS

## 2022-02-11 MED ORDER — APIXABAN 5 MG PO TABS
5.0000 mg | ORAL_TABLET | Freq: Two times a day (BID) | ORAL | Status: DC
Start: 2022-02-11 — End: 2022-02-19
  Administered 2022-02-11 – 2022-02-19 (×17): 5 mg via ORAL
  Filled 2022-02-11 (×17): qty 1

## 2022-02-11 MED ORDER — SODIUM CHLORIDE 0.9 % IV BOLUS
1000.0000 mL | INTRAVENOUS | Status: DC | PRN
  Administered 2022-02-12 – 2022-02-17 (×4): 1000 mL via INTRAVENOUS

## 2022-02-11 NOTE — Progress Notes (Signed)
PROGRESS NOTE    Christine Brady  OOI:[id number] DOB: 1961/07/09 DOA: 02/09/2022 PCP: Ronita Hipps, MD    Brief Narrative:  61 year old with history of atypical chest pain, bowel obstruction, DVT left leg, cochlear implant, chronic ITP, GERD who was recently diagnosed with GB syndrome 5 days ago with lumbar puncture presented to the emergency room with headache, numbness in her head and tongue, chronic numbness of the feet and calf.  Hemodynamically stable in the ER.  CT lumbar spine 2 weeks ago with abnormal lumbar nerve root thickening and nodularity, patient could not have MRI because of cochlear implant.  Admitted with neurology consultation.   Assessment & Plan:   Acute inflammatory demyelinating polyneuropathy: CT head with without contrast essentially normal. CT venogram of the head without any evidence of dural sinus thrombosis. Patient was admitted for plasma exchange, temporary dialysis catheter and first plasma exchange done 3/1. Patient had hypotension during the procedure and was able to finish 3/4.  Adequately resuscitated now. Also remains on high-dose thiamine replacement. We will preloaded with 1 L normal saline before next plasma exchange.  GERD: On PPI  ITP: Platelets stable.  DVT, postoperative: On Eliquis.  Resume today.   DVT prophylaxis:   Eliquis.   Code Status: Full code Family Communication: None at the bedside Disposition Plan: Status is: Inpatient Remains inpatient appropriate because: Inpatient procedures needed.  Planned for plasma exchange.             Consultants:  Neurology  Procedures:  Right IJ catheter, plasma exchange  Antimicrobials:  None   Subjective: Patient seen and examined.  Overnight events noted.  She went to dialysis last night, about three fourth finished and started having nausea, blood pressure dropped to 60 and responded to stopping plasma exchange and IV fluid bolus.  Currently asymptomatic today.  Still  has numbness of the face and tongue and numbness of both feet.   Objective: Vitals:   02/11/22 0229 02/11/22 0339 02/11/22 0500 02/11/22 0733  BP: (!) 93/57 98/62  (!) 100/50  Pulse:  62  87  Resp:  16    Temp:  98.3 F (36.8 C)  98.5 F (36.9 C)  TempSrc:  Oral  Oral  SpO2:  97%  98%  Weight:   61.6 kg   Height:        Intake/Output Summary (Last 24 hours) at 02/11/2022 1059 Last data filed at 02/11/2022 0300 Gross per 24 hour  Intake 2424.47 ml  Output --  Net 2424.47 ml   Filed Weights   02/09/22 2027 02/10/22 1841 02/11/22 0500  Weight: 61.9 kg 59.9 kg 61.6 kg    Examination:  General exam: Appears calm and comfortable.  Slightly anxious. Respiratory system: Clear to auscultation. Respiratory effort normal. Cardiovascular system: S1 & S2 heard, RRR. No JVD, murmurs, rubs, gallops or clicks. No pedal edema. Gastrointestinal system: Abdomen is nondistended, soft and nontender. No organomegaly or masses felt. Normal bowel sounds heard. Central nervous system: Alert and oriented.  No obvious neurological deficits. Extremities: Symmetric 5 x 5 power.   Data Reviewed: I have personally reviewed following labs and imaging studies  CBC: Recent Labs  Lab 02/09/22 1104  WBC 8.4  NEUTROABS 4.2  HGB 13.1  HCT 38.0  MCV 94.5  PLT 601*   Basic Metabolic Panel: Recent Labs  Lab 02/09/22 1104 02/10/22 1805  NA 136 135  K 3.5 5.1  CL 100 100  CO2 29 29  GLUCOSE 100* 122*  BUN 19 8  CREATININE 0.68 0.78  CALCIUM 9.5 9.4  MG 2.1  --    GFR: Estimated Creatinine Clearance: 65.4 mL/min (by C-G formula based on SCr of 0.78 mg/dL). Liver Function Tests: No results for input(s): AST, ALT, ALKPHOS, BILITOT, PROT, ALBUMIN in the last 168 hours. No results for input(s): LIPASE, AMYLASE in the last 168 hours. No results for input(s): AMMONIA in the last 168 hours. Coagulation Profile: No results for input(s): INR, PROTIME in the last 168 hours. Cardiac Enzymes: No  results for input(s): CKTOTAL, CKMB, CKMBINDEX, TROPONINI in the last 168 hours. BNP (last 3 results) No results for input(s): PROBNP in the last 8760 hours. HbA1C: No results for input(s): HGBA1C in the last 72 hours. CBG: No results for input(s): GLUCAP in the last 168 hours. Lipid Profile: No results for input(s): CHOL, HDL, LDLCALC, TRIG, CHOLHDL, LDLDIRECT in the last 72 hours. Thyroid Function Tests: No results for input(s): TSH, T4TOTAL, FREET4, T3FREE, THYROIDAB in the last 72 hours. Anemia Panel: Recent Labs    02/09/22 2108  FOLATE 13.7   Sepsis Labs: No results for input(s): PROCALCITON, LATICACIDVEN in the last 168 hours.  Recent Results (from the past 240 hour(s))  CSF culture     Status: None   Collection Time: 02/04/22  1:18 PM   Specimen: Lumbar Puncture; Cerebrospinal Fluid  Result Value Ref Range Status   MICRO NUMBER: [id number]  Final   SPECIMEN QUALITY: Adequate  Final   Source CEREBROSPINAL FLUID  Final   STATUS: FINAL  Final   GRAM STAIN:   Final    Gram stain prepared by cytospin No organisms or white blood cells seen   Result: No Growth  Final  Fungus culture w smear     Status: None (Preliminary result)   Collection Time: 02/04/22  1:18 PM   Specimen: Lumbar Puncture; Cerebrospinal Fluid  Result Value Ref Range Status   MICRO NUMBER: [id number]  Preliminary   SPECIMEN QUALITY: Adequate  Preliminary   Source: CEREBROSPINAL FLUID  Preliminary   STATUS: PRELIMINARY  Preliminary   SMEAR: No fungal elements seen.  Preliminary   CULTURE:   Preliminary    No fungi isolated to date. Culture is examined weekly for a total of 28 days incubation. A change in status will result in an updated culture report.  Resp Panel by RT-PCR (Flu A&B, Covid) Nasopharyngeal Swab     Status: None   Collection Time: 02/09/22 11:04 AM   Specimen: Nasopharyngeal Swab; Nasopharyngeal(NP) swabs in vial transport medium  Result Value Ref Range Status   SARS Coronavirus 2 by RT PCR  NEGATIVE NEGATIVE Final    Comment: (NOTE) SARS-CoV-2 target nucleic acids are NOT DETECTED.  The SARS-CoV-2 RNA is generally detectable in upper respiratory specimens during the acute phase of infection. The lowest concentration of SARS-CoV-2 viral copies this assay can detect is 138 copies/mL. A negative result does not preclude SARS-Cov-2 infection and should not be used as the sole basis for treatment or other patient management decisions. A negative result may occur with  improper specimen collection/handling, submission of specimen other than nasopharyngeal swab, presence of viral mutation(s) within the areas targeted by this assay, and inadequate number of viral copies(<138 copies/mL). A negative result must be combined with clinical observations, patient history, and epidemiological information. The expected result is Negative.  Fact Sheet for Patients:  BloggerCourse.com  Fact Sheet for Healthcare Providers:  SeriousBroker.it  This test is no t yet approved or cleared by the [place] and  has been authorized for detection and/or diagnosis of SARS-CoV-2 by FDA under an Emergency Use Authorization (EUA). This EUA will remain  in effect (meaning this test can be used) for the duration of the COVID-19 declaration under Section 564(b)(1) of the Act, 21 U.S.C.section 360bbb-3(b)(1), unless the authorization is terminated  or revoked sooner.       Influenza A by PCR NEGATIVE NEGATIVE Final   Influenza B by PCR NEGATIVE NEGATIVE Final    Comment: (NOTE) The Xpert Xpress SARS-CoV-2/FLU/RSV plus assay is intended as an aid in the diagnosis of influenza from Nasopharyngeal swab specimens and should not be used as a sole basis for treatment. Nasal washings and aspirates are unacceptable for Xpert Xpress SARS-CoV-2/FLU/RSV testing.  Fact Sheet for Patients: EntrepreneurPulse.com.au  Fact Sheet for  Healthcare Providers: IncredibleEmployment.be  This test is not yet approved or cleared by the [place] FDA and has been authorized for detection and/or diagnosis of SARS-CoV-2 by FDA under an Emergency Use Authorization (EUA). This EUA will remain in effect (meaning this test can be used) for the duration of the COVID-19 declaration under Section 564(b)(1) of the Act, 21 U.S.C. section 360bbb-3(b)(1), unless the authorization is terminated or revoked.  Performed at [facility], [place] [address] [place], [place] [address]          Radiology Studies: CT HEAD W & WO CONTRAST (5MM)  Result Date: 02/10/2022 CLINICAL DATA:  Dural venous sinus thrombosis suspected EXAM: CT HEAD WITHOUT AND WITH CONTRAST CT VENOGRAPHY OF THE HEAD TECHNIQUE: Contiguous axial images were obtained from the base of the skull through the vertex without and with intravenous contrast. Venographic phase images of the brain were obtained following the administration of intravenous contrast. Multiplanar reformats and maximum intensity projections were generated. RADIATION DOSE REDUCTION: This exam was performed according to the departmental dose-optimization program which includes automated exposure control, adjustment of the mA and/or kV according to patient size and/or use of iterative reconstruction technique. CONTRAST:  79mL OMNIPAQUE IOHEXOL 350 MG/ML SOLN COMPARISON:  None. FINDINGS: CT HEAD Streak artifact from right cochlear implant. Findings below within this limitation. Brain: There is no acute intracranial hemorrhage, mass, mass effect, or edema. No abnormal enhancement. Gray-white differentiation is preserved. There is no extra-axial fluid collection. Ventricles and sulci are within normal limits in size and configuration. Vascular: No hyperdense vessel or unexpected calcification. Skull: Calvarium is unremarkable. Sinuses/Orbits: No acute finding. Other: There are no  internal electrodes associated with cochlear implant. Prior canal wall up mastoidectomy. Nonspecific opacification of the mastoidectomy bowl extending into the middle ear cleft. Probable left ossicle implant. CTV HEAD Superior sagittal sinus, straight sinus, vein of Galen, and internal cerebral veins are patent. Left transverse and sigmoid sinuses are patent. The right transverse and sigmoid sinuses are partially obscured by artifact. Visualized portions are patent. IMPRESSION: No acute intracranial abnormality within limitation of streak artifact from cochlear implant apparatus. Of note, there are no apparent electrodes associated with the implant. No evidence of dural sinus thrombosis within limitation of streak artifact. Electronically Signed   By: Macy Mis M.D.   On: 02/10/2022 08:46   IR Fluoro Guide CV Line Right  Result Date: 02/11/2022 INDICATION: 61 year old female requiring central venous access for plasmapheresis in the setting of Guillain-Barre syndrome. EXAM: NON-TUNNELED CENTRAL VENOUS HEMODIALYSIS CATHETER PLACEMENT WITH ULTRASOUND AND FLUOROSCOPIC GUIDANCE COMPARISON:  None. MEDICATIONS: None FLUOROSCOPY TIME:  0.1 minutes (0 mGy) COMPLICATIONS: None immediate. PROCEDURE: Informed written consent was obtained from the patient after a discussion  of the risks, benefits, and alternatives to treatment. Questions regarding the procedure were encouraged and answered. The right neck and chest were prepped with chlorhexidine in a sterile fashion, and a sterile drape was applied covering the operative field. Maximum barrier sterile technique with sterile gowns and gloves were used for the procedure. A timeout was performed prior to the initiation of the procedure. After the overlying soft tissues were anesthetized, a small venotomy incision was created and a micropuncture kit was utilized to access the internal jugular vein. Real-time ultrasound guidance was utilized for vascular access including the  acquisition of a permanent ultrasound image documenting patency of the accessed vessel. The microwire was utilized to measure appropriate catheter length. A Rosen wire was advanced to the level of the IVC. Under fluoroscopic guidance, the venotomy was serially dilated, ultimately allowing placement of a 20 cm temporary Trialysis catheter with tip ultimately terminating within the superior aspect of the right atrium. Final catheter positioning was confirmed and documented with a spot radiographic image. The catheter aspirates and flushes normally. The catheter was flushed with appropriate volume heparin dwells. The catheter exit site was secured with a 0 silk retention suture. A dressing was placed. The patient tolerated the procedure well without immediate post procedural complication. IMPRESSION: Successful placement of a right internal jugular approach 20 cm temporary dialysis catheter with tip terminating with in the superior aspect of the right atrium. The catheter is ready for immediate use. Ruthann Cancer, MD [facility] Radiology Electronically Signed   By: Ruthann Cancer M.D.   On: 02/11/2022 07:37   IR US Guide Vasc Access Right  Result Date: 02/11/2022 INDICATION: 61 year old female requiring central venous access for plasmapheresis in the setting of Guillain-Barre syndrome. EXAM: NON-TUNNELED CENTRAL VENOUS HEMODIALYSIS CATHETER PLACEMENT WITH ULTRASOUND AND FLUOROSCOPIC GUIDANCE COMPARISON:  None. MEDICATIONS: None FLUOROSCOPY TIME:  0.1 minutes (0 mGy) COMPLICATIONS: None immediate. PROCEDURE: Informed written consent was obtained from the patient after a discussion of the risks, benefits, and alternatives to treatment. Questions regarding the procedure were encouraged and answered. The right neck and chest were prepped with chlorhexidine in a sterile fashion, and a sterile drape was applied covering the operative field. Maximum barrier sterile technique  with sterile gowns and gloves were used for the procedure. A timeout was performed prior to the initiation of the procedure. After the overlying soft tissues were anesthetized, a small venotomy incision was created and a micropuncture kit was utilized to access the internal jugular vein. Real-time ultrasound guidance was utilized for vascular access including the acquisition of a permanent ultrasound image documenting patency of the accessed vessel. The microwire was utilized to measure appropriate catheter length. A Rosen wire was advanced to the level of the IVC. Under fluoroscopic guidance, the venotomy was serially dilated, ultimately allowing placement of a 20 cm temporary Trialysis catheter with tip ultimately terminating within the superior aspect of the right atrium. Final catheter positioning was confirmed and documented with a spot radiographic image. The catheter aspirates and flushes normally. The catheter was flushed with appropriate volume heparin dwells. The catheter exit site was secured with a 0 silk retention suture. A dressing was placed. The patient tolerated the procedure well without immediate post procedural complication. IMPRESSION: Successful placement of a right internal jugular approach 20 cm temporary dialysis catheter with tip terminating with in the superior aspect of the right atrium. The catheter is ready for immediate use. Ruthann Cancer, MD [facility] Radiology Electronically Signed  By: Ruthann Cancer M.D.   On: 02/11/2022 07:37   CT VENOGRAM HEAD  Result Date: 02/10/2022 CLINICAL DATA:  Dural venous sinus thrombosis suspected EXAM: CT HEAD WITHOUT AND WITH CONTRAST CT VENOGRAPHY OF THE HEAD TECHNIQUE: Contiguous axial images were obtained from the base of the skull through the vertex without and with intravenous contrast. Venographic phase images of the brain were obtained following the administration of intravenous contrast.  Multiplanar reformats and maximum intensity projections were generated. RADIATION DOSE REDUCTION: This exam was performed according to the departmental dose-optimization program which includes automated exposure control, adjustment of the mA and/or kV according to patient size and/or use of iterative reconstruction technique. CONTRAST:  79mL OMNIPAQUE IOHEXOL 350 MG/ML SOLN COMPARISON:  None. FINDINGS: CT HEAD Streak artifact from right cochlear implant. Findings below within this limitation. Brain: There is no acute intracranial hemorrhage, mass, mass effect, or edema. No abnormal enhancement. Gray-white differentiation is preserved. There is no extra-axial fluid collection. Ventricles and sulci are within normal limits in size and configuration. Vascular: No hyperdense vessel or unexpected calcification. Skull: Calvarium is unremarkable. Sinuses/Orbits: No acute finding. Other: There are no internal electrodes associated with cochlear implant. Prior canal wall up mastoidectomy. Nonspecific opacification of the mastoidectomy bowl extending into the middle ear cleft. Probable left ossicle implant. CTV HEAD Superior sagittal sinus, straight sinus, vein of Galen, and internal cerebral veins are patent. Left transverse and sigmoid sinuses are patent. The right transverse and sigmoid sinuses are partially obscured by artifact. Visualized portions are patent. IMPRESSION: No acute intracranial abnormality within limitation of streak artifact from cochlear implant apparatus. Of note, there are no apparent electrodes associated with the implant. No evidence of dural sinus thrombosis within limitation of streak artifact. Electronically Signed   By: Macy Mis M.D.   On: 02/10/2022 08:46        Scheduled Meds:  apixaban  5 mg Oral BID   calcium-vitamin D  2 tablet Oral Daily   Chlorhexidine Gluconate Cloth  6 each Topical Daily   heparin sodium (porcine)  1,000 Units Intracatheter Once   pantoprazole  40 mg  Oral Daily   [START ON 02/18/2022] thiamine injection  100 mg Intravenous Daily   Continuous Infusions:  citrate dextrose     lactated ringers 50 mL/hr at 02/10/22 2101   sodium chloride     thiamine injection 500 mg (02/11/22 0530)   Followed by   Derrill Memo ON 02/12/2022] thiamine injection       LOS: 2 days    Time spent: 32 minutes    Barb Merino, MD [facility] Pager [phone]

## 2022-02-11 NOTE — Progress Notes (Signed)
Neurology Progress Note ? ?Brief HPI:  ?Christine Brady is a 61 y.o. female with PMH significant for chronic ITP, HLD, arthritis and s/p recent R Knee replacement, DVTs in L popliteal vein in Dec 2022 and on Eliquis who presents with BL leg numbness that started in nov 2022 in her feet and was stable until February and now has numbness upto her waist along with numbness of her entire face. She now has to use a cane to walk. Outpatient workup with unable to get MRIs due to issues with her cochlear implant but CT Lumbar myelogram with abnormal lumbar nerve root thickening and nodularity. Outpatient LP with pleocytosis with WBC of 19, elevated protein to 109. ? ?Subjective: ?Patient became hypotensive and bradycardic at the end of first PLEX session yesterday evening. Patient reports that she experienced severe nausea and lightheadedness, followed by vomiting. PLEX nurse stopped the session with 13 minutes left.  ? ?Exam: ?Vitals:  ? 02/11/22 0733 02/11/22 1106  ?BP: (!) 100/50 99/69  ?Pulse: 87 70  ?Resp:    ?Temp: 98.5 ?F (36.9 ?C) 98 ?F (36.7 ?C)  ?SpO2: 98% 99%  ? ?Gen: In bed, NAD ?Resp: non-labored breathing, no acute distress ?Abd: soft, nt ? ?Neuro: ?Mental Status: fully alert and oriented ?Gait: slow and cautious, short stride length ?Strength 5/5 in bilateral upper extremities ? ? ?Pertinent Labs: ?CSF protein of 109, WBCs of 19 (95% lymphs), oligoclonal bands with 3 gamma restricted bands ?Antimyelin glycoprotein: pending ?NMO ab: pending ?HSV PCR: pending ?[place] B19: pending ?[place]: cancelled ?EBV: pending ?CMV IgM: negative ?Lyme serology: pending ?ANA: pending ?RF: negative ?HIV: negative ?RPR: negative ?ESR/CRP: normal ? ?Imaging Reviewed: ?CT myelogram lumbar spine (2/15): abnormal lumbar nerve root thickening and nodularity ? ?[place] w and wo contrast, CT venogram (2/28): no acute anomaly, no evidence of dural sinus thrombosis ? ?MRI-Brain: unable to obtain due to cochlear implant ? ?Assessment:   ?Christine Brady is a 61 y.o. female with PMH significant for chronic ITP, HLD, arthritis and s/p recent R Knee replacement, DVTs in L popliteal vein in Dec 2022 and on Eliquis who presents with BL leg numbness that started in nov 2022 in her feet and was stable until February and now has numbness upto her waist along with numbness of her entire face. She now has to use a cane to walk. Outpatient workup with unable to get MRIs due to issues with her cochlear implant but CT Lumbar myelogram with abnormal lumbar nerve root thickening and nodularity. Outpatient LP with pleocytosis with WBC of 19, elevated protein to 109. ? ?We have started PLEX instead of IVIG given the patient's Hx of LLE DVT.  ? 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 ? ?Recommendations: ?1) Continue with PLEX (5 total sessions QOD; 3/3, 3/5, 3/7, 3/9) ? -Pretreat with Tylenol, Zofran, Benadryl (for anxiety), and 1L bolus  ?2) Avoid ACE/ARBs while on PLEX ?3) Follow up pending labs. Oligoclonal bands show 3 matched bands. This probably suggests extracranial, likely systemic, antibody production. ? ? ?Corky Sox, MD ?PGY-1 ? ?NEUROHOSPITALIST ADDENDUM ?Performed a face to face diagnostic evaluation.  ? ?I have reviewed the contents of history and physical exam as documented by PA/ARNP/Resident and agree with above documentation.  ?I have discussed and formulated the above plan as documented. Edits to the note have been made as needed. ? ?Impression/Key exam findings/Plan: Went hypotensive towards the end of her first PLEX session. I had discussion with patient and we made a joint decision  to trial PLEX. She felt that because she has not been eating and drinking well and that probably caused it. We discussed pretreatment with Tylenol $RemoveBefo'650mg'[id number]$ , Zofran IV $RemoveB'4mg'EbDeTPVI$ , Benadryl IV 25 mg(for anxiety) and 1L fluid bolus as a pretreatment before PLEX and hope that this helped [place]. We also discussed that we will immediately stop PLEX if she is feeling odd  or if her blood pressure or heart rate drops. I also reached out to our dialysis RN [place] to update her on the new recs. ? ?Most of her labs are still pending. Her Oligoclonal bands shows 3 identical gamma restriction bands in CSF and serum. This suggests that the antibody production is systemic rather than in the CNS and PLEX is the appropriate modality of treatment for this at this time. ? ?Donnetta Simpers, MD ?[facility] ?[phone] ?  ?If 7pm to 7am, please call on call as listed on AMION. ? ? ?

## 2022-02-12 LAB — COMPREHENSIVE METABOLIC PANEL
ALT: 19 U/L (ref 0–44)
AST: 14 U/L — ABNORMAL LOW (ref 15–41)
Albumin: 3.3 g/dL — ABNORMAL LOW (ref 3.5–5.0)
Alkaline Phosphatase: 20 U/L — ABNORMAL LOW (ref 38–126)
Anion gap: 7 (ref 5–15)
BUN: 8 mg/dL (ref 6–20)
CO2: 30 mmol/L (ref 22–32)
Calcium: 8.8 mg/dL — ABNORMAL LOW (ref 8.9–10.3)
Chloride: 103 mmol/L (ref 98–111)
Creatinine, Ser: 0.61 mg/dL (ref 0.44–1.00)
GFR, Estimated: >60 mL/min (ref >60)
Glucose, Bld: 114 mg/dL — ABNORMAL HIGH (ref 70–99)
Potassium: 4.4 mmol/L (ref 3.5–5.1)
Sodium: 140 mmol/L (ref 135–145)
Total Bilirubin: 0.6 mg/dL (ref 0.3–1.2)
Total Protein: 4.5 g/dL — ABNORMAL LOW (ref 6.5–8.1)

## 2022-02-12 LAB — CBC
HCT: 34.6 % — ABNORMAL LOW (ref 36.0–46.0)
Hemoglobin: 11.7 g/dL — ABNORMAL LOW (ref 12.0–15.0)
MCH: 32.6 pg (ref 26.0–34.0)
MCHC: 33.8 g/dL (ref 30.0–36.0)
MCV: 96.4 fL (ref 80.0–100.0)
Platelets: 92 10*3/uL — ABNORMAL LOW (ref 150–400)
RBC: 3.59 MIL/uL — ABNORMAL LOW (ref 3.87–5.11)
RDW: 12.4 % (ref 11.5–15.5)
WBC: 5.7 10*3/uL (ref 4.0–10.5)
nRBC: 0 % (ref 0.0–0.2)

## 2022-02-12 MED ORDER — LACTATED RINGERS IV BOLUS
500.0000 mL | Freq: Once | INTRAVENOUS | Status: AC
  Administered 2022-02-12: 500 mL via INTRAVENOUS

## 2022-02-12 MED ORDER — ACD FORMULA A 0.73-2.45-2.2 GM/100ML VI SOLN
1000.0000 mL | Status: DC
  Administered 2022-02-12: 1000 mL
  Filled 2022-02-12 (×2): qty 1000

## 2022-02-12 MED ORDER — CALCIUM GLUCONATE-NACL 2-0.675 GM/100ML-% IV SOLN
2.0000 g | Freq: Once | INTRAVENOUS | Status: AC
  Administered 2022-02-12: 2000 mg via INTRAVENOUS
  Filled 2022-02-12: qty 100

## 2022-02-12 MED ORDER — CALCIUM CARBONATE ANTACID 500 MG PO CHEW
2.0000 | CHEWABLE_TABLET | ORAL | Status: AC
  Administered 2022-02-12 (×2): 400 mg via ORAL
  Filled 2022-02-12: qty 2

## 2022-02-12 MED ORDER — HEPARIN SODIUM (PORCINE) 1000 UNIT/ML IJ SOLN
1000.0000 [IU] | Freq: Once | INTRAMUSCULAR | Status: DC
Start: 2022-02-12 — End: 2022-02-15
  Filled 2022-02-12: qty 1

## 2022-02-12 MED ORDER — DIPHENHYDRAMINE HCL 25 MG PO CAPS
25.0000 mg | ORAL_CAPSULE | Freq: Four times a day (QID) | ORAL | Status: DC | PRN
  Filled 2022-02-12: qty 1

## 2022-02-12 MED ORDER — ACETAMINOPHEN 325 MG PO TABS
650.0000 mg | ORAL_TABLET | ORAL | Status: DC | PRN
  Administered 2022-02-12: 650 mg via ORAL
  Filled 2022-02-12: qty 2

## 2022-02-12 MED ORDER — SODIUM CHLORIDE 0.9 % IV SOLN
INTRAVENOUS | Status: AC
  Filled 2022-02-12 (×3): qty 200

## 2022-02-12 NOTE — Plan of Care (Signed)

## 2022-02-12 NOTE — Progress Notes (Signed)
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  Hemodynamically stable in the ER.  CT lumbar spine 2 weeks ago with abnormal lumbar nerve root thickening and nodularity, patient could not have MRI because of cochlear implant.  Admitted with neurology consultation.  Currently receiving plasma exchange.   Assessment & Plan:   Acute inflammatory demyelinating polyneuropathy: CT head with without contrast essentially normal. CT venogram of the head without any evidence of dural sinus thrombosis. Temporary dialysis catheter. Plasma exchange 3/1, intradialytic hypotension improved with IV fluid. Planned exchanges 3/3, premedicate with 1 L normal saline, Benadryl and Zofran. Remains on high-dose thiamine replacement.  Followed by neurology.  GERD: On PPI  ITP: Platelets stable.  DVT, postoperative: On Eliquis.   DVT prophylaxis:   Eliquis. apixaban (ELIQUIS) tablet 5 mg   Code Status: Full code Family Communication: None at the bedside Disposition Plan: Status is: Inpatient Remains inpatient appropriate because: Inpatient procedures needed.  Planned for plasma exchange.   Consultants:  Neurology  Procedures:  Right IJ catheter, plasma exchange  Antimicrobials:  None   Subjective:  Seen and examined.  Walking in the hallway.  Some numbness persist but no other problems.  Anxious about procedure today due to previous event of low blood pressure and low heart rate.  Objective: Vitals:   02/12/22 0947 02/12/22 0958 02/12/22 1012 02/12/22 1022  BP: 105/62 97/60  98/62  Pulse: 79 67 65 85  Resp: $Remo'14 12 15 16  'PpNyd$ Temp: 98.1 F  (36.7 C) 98.1 F (36.7 C)    TempSrc: Oral Oral    SpO2: 97% 97% 99% 99%  Weight: 62.7 kg     Height:       No intake or output data in the 24 hours ending 02/12/22 1108  Filed Weights   02/10/22 1841 02/11/22 0500 02/12/22 0947  Weight: 59.9 kg 61.6 kg 62.7 kg    Examination:  General exam: Appears calm and comfortable.  Slightly anxious. Walking around the hallway. Respiratory system: Clear to auscultation. Respiratory effort normal. Cardiovascular system: S1 & S2 heard, RRR. No JVD, murmurs, rubs, gallops or clicks. No pedal edema. Gastrointestinal system: Soft.  Nontender.   Central nervous system: Alert and oriented.  No obvious neurological deficits. Extremities: Symmetric 5 x 5 power.   Data Reviewed: I have personally reviewed following labs and imaging studies  CBC: Recent Labs  Lab 02/09/22 1104 02/12/22 0502  WBC 8.4 5.7  NEUTROABS 4.2  --   HGB 13.1 11.7*  HCT 38.0 34.6*  MCV 94.5 96.4  PLT 126* 92*   Basic Metabolic Panel: Recent Labs  Lab 02/09/22 1104 02/10/22 1805 02/12/22 0502  NA 136 135 140  K 3.5 5.1 4.4  CL 100 100 103  CO2 $Re'29 29 30  'LiO$ GLUCOSE 100* 122* 114*  BUN $Re'19 8 8  'qcr$ CREATININE 0.68 0.78 0.61  CALCIUM 9.5 9.4 8.8*  MG 2.1  --   --    GFR: Estimated Creatinine Clearance: 65.9 mL/min (by C-G formula based on SCr of 0.61 mg/dL). Liver Function Tests: Recent Labs  Lab 02/12/22 0502  AST 14*  ALT 19  ALKPHOS 20*  BILITOT  0.6  PROT 4.5*  ALBUMIN 3.3*   No results for input(s): LIPASE, AMYLASE in the last 168 hours. No results for input(s): AMMONIA in the last 168 hours. Coagulation Profile: No results for input(s): INR, PROTIME in the last 168 hours. Cardiac Enzymes: No results for input(s): CKTOTAL, CKMB, CKMBINDEX, TROPONINI in the last 168 hours. BNP (last 3 results) No results for input(s): PROBNP in the last 8760 hours. HbA1C: No results for input(s): HGBA1C in the last 72 hours. CBG: No results for input(s): GLUCAP  in the last 168 hours. Lipid Profile: No results for input(s): CHOL, HDL, LDLCALC, TRIG, CHOLHDL, LDLDIRECT in the last 72 hours. Thyroid Function Tests: No results for input(s): TSH, T4TOTAL, FREET4, T3FREE, THYROIDAB in the last 72 hours. Anemia Panel: Recent Labs    02/09/22 2108  FOLATE 13.7   Sepsis Labs: No results for input(s): PROCALCITON, LATICACIDVEN in the last 168 hours.  Recent Results (from the past 240 hour(s))  CSF culture     Status: None   Collection Time: 02/04/22  1:18 PM   Specimen: Lumbar Puncture; Cerebrospinal Fluid  Result Value Ref Range Status   MICRO NUMBER: [id number]  Final   SPECIMEN QUALITY: Adequate  Final   Source CEREBROSPINAL FLUID  Final   STATUS: FINAL  Final   GRAM STAIN:   Final    Gram stain prepared by cytospin No organisms or white blood cells seen   Result: No Growth  Final  Fungus culture w smear     Status: None (Preliminary result)   Collection Time: 02/04/22  1:18 PM   Specimen: Lumbar Puncture; Cerebrospinal Fluid  Result Value Ref Range Status   MICRO NUMBER: [id number]  Preliminary   SPECIMEN QUALITY: Adequate  Preliminary   Source: CEREBROSPINAL FLUID  Preliminary   STATUS: PRELIMINARY  Preliminary   SMEAR: No fungal elements seen.  Preliminary   CULTURE:   Preliminary    No fungi isolated to date. Culture is examined weekly for a total of 28 days incubation. A change in status will result in an updated culture report.  Resp Panel by RT-PCR (Flu A&B, Covid) Nasopharyngeal Swab     Status: None   Collection Time: 02/09/22 11:04 AM   Specimen: Nasopharyngeal Swab; Nasopharyngeal(NP) swabs in vial transport medium  Result Value Ref Range Status   SARS Coronavirus 2 by RT PCR NEGATIVE NEGATIVE Final    Comment: (NOTE) SARS-CoV-2 target nucleic acids are NOT DETECTED.  The SARS-CoV-2 RNA is generally detectable in upper respiratory specimens during the acute phase of infection. The lowest concentration of SARS-CoV-2 viral  copies this assay can detect is 138 copies/mL. A negative result does not preclude SARS-Cov-2 infection and should not be used as the sole basis for treatment or other patient management decisions. A negative result may occur with  improper specimen collection/handling, submission of specimen other than nasopharyngeal swab, presence of viral mutation(s) within the areas targeted by this assay, and inadequate number of viral copies(<138 copies/mL). A negative result must be combined with clinical observations, patient history, and epidemiological information. The expected result is Negative.  Fact Sheet for Patients:  EntrepreneurPulse.com.au  Fact Sheet for Healthcare Providers:  IncredibleEmployment.be  This test is no t yet approved or cleared by the [place] FDA and  has been authorized for detection and/or diagnosis of SARS-CoV-2 by FDA under an Emergency Use Authorization (EUA). This EUA will remain  in effect (meaning this test can be used) for the duration of the COVID-19 declaration under Section  564(b)(1) of the Act, 21 U.S.C.section 360bbb-3(b)(1), unless the authorization is terminated  or revoked sooner.       Influenza A by PCR NEGATIVE NEGATIVE Final   Influenza B by PCR NEGATIVE NEGATIVE Final    Comment: (NOTE) The Xpert Xpress SARS-CoV-2/FLU/RSV plus assay is intended as an aid in the diagnosis of influenza from Nasopharyngeal swab specimens and should not be used as a sole basis for treatment. Nasal washings and aspirates are unacceptable for Xpert Xpress SARS-CoV-2/FLU/RSV testing.  Fact Sheet for Patients: EntrepreneurPulse.com.au  Fact Sheet for Healthcare Providers: IncredibleEmployment.be  This test is not yet approved or cleared by the [place] FDA and has been authorized for detection and/or diagnosis of SARS-CoV-2 by FDA under an Emergency Use Authorization (EUA). This  EUA will remain in effect (meaning this test can be used) for the duration of the COVID-19 declaration under Section 564(b)(1) of the Act, 21 U.S.C. section 360bbb-3(b)(1), unless the authorization is terminated or revoked.  Performed at [facility], [place] [address] [place], [place] [address]          Radiology Studies: IR Fluoro Guide CV Line Right  Result Date: 02/11/2022 INDICATION: 61 year old female requiring central venous access for plasmapheresis in the setting of Guillain-Barre syndrome. EXAM: NON-TUNNELED CENTRAL VENOUS HEMODIALYSIS CATHETER PLACEMENT WITH ULTRASOUND AND FLUOROSCOPIC GUIDANCE COMPARISON:  None. MEDICATIONS: None FLUOROSCOPY TIME:  0.1 minutes (0 mGy) COMPLICATIONS: None immediate. PROCEDURE: Informed written consent was obtained from the patient after a discussion of the risks, benefits, and alternatives to treatment. Questions regarding the procedure were encouraged and answered. The right neck and chest were prepped with chlorhexidine in a sterile fashion, and a sterile drape was applied covering the operative field. Maximum barrier sterile technique with sterile gowns and gloves were used for the procedure. A timeout was performed prior to the initiation of the procedure. After the overlying soft tissues were anesthetized, a small venotomy incision was created and a micropuncture kit was utilized to access the internal jugular vein. Real-time ultrasound guidance was utilized for vascular access including the acquisition of a permanent ultrasound image documenting patency of the accessed vessel. The microwire was utilized to measure appropriate catheter length. A Rosen wire was advanced to the level of the IVC. Under fluoroscopic guidance, the venotomy was serially dilated, ultimately allowing placement of a 20 cm temporary Trialysis catheter with tip ultimately terminating within the superior aspect of the right atrium. Final catheter positioning was  confirmed and documented with a spot radiographic image. The catheter aspirates and flushes normally. The catheter was flushed with appropriate volume heparin dwells. The catheter exit site was secured with a 0 silk retention suture. A dressing was placed. The patient tolerated the procedure well without immediate post procedural complication. IMPRESSION: Successful placement of a right internal jugular approach 20 cm temporary dialysis catheter with tip terminating with in the superior aspect of the right atrium. The catheter is ready for immediate use. Ruthann Cancer, MD [facility] Radiology Electronically Signed   By: Ruthann Cancer M.D.   On: 02/11/2022 07:37   IR US Guide Vasc Access Right  Result Date: 02/11/2022 INDICATION: 61 year old female requiring central venous access for plasmapheresis in the setting of Guillain-Barre syndrome. EXAM: NON-TUNNELED CENTRAL VENOUS HEMODIALYSIS CATHETER PLACEMENT WITH ULTRASOUND AND FLUOROSCOPIC GUIDANCE COMPARISON:  None. MEDICATIONS: None FLUOROSCOPY TIME:  0.1 minutes (0 mGy) COMPLICATIONS: None immediate. PROCEDURE: Informed written consent was obtained from the patient after a discussion of the risks, benefits, and  alternatives to treatment. Questions regarding the procedure were encouraged and answered. The right neck and chest were prepped with chlorhexidine in a sterile fashion, and a sterile drape was applied covering the operative field. Maximum barrier sterile technique with sterile gowns and gloves were used for the procedure. A timeout was performed prior to the initiation of the procedure. After the overlying soft tissues were anesthetized, a small venotomy incision was created and a micropuncture kit was utilized to access the internal jugular vein. Real-time ultrasound guidance was utilized for vascular access including the acquisition of a permanent ultrasound image documenting patency of the accessed  vessel. The microwire was utilized to measure appropriate catheter length. A Rosen wire was advanced to the level of the IVC. Under fluoroscopic guidance, the venotomy was serially dilated, ultimately allowing placement of a 20 cm temporary Trialysis catheter with tip ultimately terminating within the superior aspect of the right atrium. Final catheter positioning was confirmed and documented with a spot radiographic image. The catheter aspirates and flushes normally. The catheter was flushed with appropriate volume heparin dwells. The catheter exit site was secured with a 0 silk retention suture. A dressing was placed. The patient tolerated the procedure well without immediate post procedural complication. IMPRESSION: Successful placement of a right internal jugular approach 20 cm temporary dialysis catheter with tip terminating with in the superior aspect of the right atrium. The catheter is ready for immediate use. Ruthann Cancer, MD [facility] Radiology Electronically Signed   By: Ruthann Cancer M.D.   On: 02/11/2022 07:37        Scheduled Meds:  apixaban  5 mg Oral BID   calcium carbonate  2 tablet Oral Q3H   calcium-vitamin D  2 tablet Oral Daily   Chlorhexidine Gluconate Cloth  6 each Topical Daily   heparin sodium (porcine)  1,000 Units Intracatheter Once   heparin sodium (porcine)  1,000 Units Intracatheter Once   pantoprazole  40 mg Oral Daily   [START ON 02/18/2022] thiamine injection  100 mg Intravenous Daily   Continuous Infusions:  calcium gluconate 2,000 mg (02/12/22 0957)   citrate dextrose     citrate dextrose     lactated ringers 50 mL/hr at 02/12/22 0134   sodium chloride 1,000 mL (02/12/22 0850)   thiamine injection 250 mg (02/12/22 0841)     LOS: 3 days    Time spent: 32 minutes    Barb Merino, MD [facility] Pager [phone]

## 2022-02-13 LAB — BASIC METABOLIC PANEL
Anion gap: 9 (ref 5–15)
BUN: 9 mg/dL (ref 6–20)
CO2: 27 mmol/L (ref 22–32)
Calcium: 9.4 mg/dL (ref 8.9–10.3)
Chloride: 103 mmol/L (ref 98–111)
Creatinine, Ser: 0.61 mg/dL (ref 0.44–1.00)
GFR, Estimated: >60 mL/min (ref >60)
Glucose, Bld: 99 mg/dL (ref 70–99)
Potassium: 4.2 mmol/L (ref 3.5–5.1)
Sodium: 139 mmol/L (ref 135–145)

## 2022-02-13 MED ORDER — ACD FORMULA A 0.73-2.45-2.2 GM/100ML VI SOLN
1000.0000 mL | Status: DC
  Filled 2022-02-13: qty 1000

## 2022-02-13 MED ORDER — CALCIUM CARBONATE ANTACID 500 MG PO CHEW
CHEWABLE_TABLET | ORAL | Status: AC
  Filled 2022-02-13: qty 2

## 2022-02-13 MED ORDER — DIPHENHYDRAMINE HCL 25 MG PO CAPS
ORAL_CAPSULE | ORAL | Status: AC
Start: 2022-02-13 — End: 2022-02-13
  Administered 2022-02-13: 25 mg via ORAL
  Filled 2022-02-13: qty 1

## 2022-02-13 MED ORDER — CALCIUM GLUCONATE-NACL 2-0.675 GM/100ML-% IV SOLN
INTRAVENOUS | Status: AC
  Filled 2022-02-13: qty 100

## 2022-02-13 MED ORDER — SODIUM CHLORIDE 0.9 % IV SOLN
INTRAVENOUS | Status: AC
  Filled 2022-02-13 (×3): qty 200

## 2022-02-13 MED ORDER — ACD FORMULA A 0.73-2.45-2.2 GM/100ML VI SOLN
Status: AC
  Administered 2022-02-13: 1000 mL
  Filled 2022-02-13: qty 1000

## 2022-02-13 MED ORDER — HEPARIN SODIUM (PORCINE) 1000 UNIT/ML IJ SOLN
1000.0000 [IU] | Freq: Once | INTRAMUSCULAR | Status: AC
  Filled 2022-02-13: qty 1

## 2022-02-13 MED ORDER — DIPHENHYDRAMINE HCL 25 MG PO CAPS
25.0000 mg | ORAL_CAPSULE | Freq: Four times a day (QID) | ORAL | Status: DC | PRN
  Filled 2022-02-13: qty 1

## 2022-02-13 MED ORDER — METHOCARBAMOL 500 MG PO TABS
1000.0000 mg | ORAL_TABLET | Freq: Once | ORAL | Status: DC | PRN
Start: 2022-02-13 — End: 2022-02-19

## 2022-02-13 MED ORDER — CALCIUM GLUCONATE-NACL 2-0.675 GM/100ML-% IV SOLN
2.0000 g | Freq: Once | INTRAVENOUS | Status: AC
  Administered 2022-02-13: 2000 mg via INTRAVENOUS
  Filled 2022-02-13: qty 100

## 2022-02-13 MED ORDER — ACETAMINOPHEN 325 MG PO TABS
650.0000 mg | ORAL_TABLET | ORAL | Status: DC | PRN
  Administered 2022-02-13: 650 mg via ORAL
  Filled 2022-02-13: qty 2

## 2022-02-13 NOTE — Progress Notes (Signed)
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  Hemodynamically stable in the ER.  CT lumbar spine 2 weeks ago with abnormal lumbar nerve root thickening and nodularity, patient could not have MRI because of cochlear implant.  Admitted with neurology consultation.  Currently receiving plasma exchange.   Assessment & Plan:   Acute inflammatory demyelinating polyneuropathy: CT head with without contrast essentially normal. CT venogram of the head without any evidence of dural sinus thrombosis. Temporary dialysis catheter. Plasma exchange 3/1, intradialytic hypotension improved with IV fluid. Planned exchanges 3/3, premedicate with 1 L normal saline, Benadryl and Zofran.  Uneventful procedure. Plasma exchange planned 3/4. Remains on high-dose thiamine replacement.  Followed by neurology.  GERD: On PPI  ITP: Platelets stable.  DVT, postoperative: On Eliquis.  Mobilize around.  DC cardiac monitor.   DVT prophylaxis:   Eliquis. apixaban (ELIQUIS) tablet 5 mg   Code Status: Full code Family Communication: Sister at the bedside. Disposition Plan: Status is: Inpatient Remains inpatient appropriate because: Inpatient procedures needed.  Planned for plasma exchange.   Consultants:  Neurology  Procedures:  Right IJ catheter, plasma exchange  Antimicrobials:  None   Subjective: Seen and examined.  No overnight events.  Uneventful plasma exchange yesterday after premedication.  Still has some tingling sensation around tongue and lips.  Objective: Vitals:   02/13/22 0012 02/13/22 0431 02/13/22 0434 02/13/22 0749  BP: (!) 97/43   (!) 86/42 93/65  Pulse: 67  60 68  Resp: '20  18 16  '$ Temp:   (!) 97.4 F (36.3 C) 98.8 F (37.1 C)  TempSrc:   Oral Oral  SpO2: 97%  97% 100%  Weight:  64.2 kg    Height:        Intake/Output Summary (Last 24 hours) at 02/13/2022 1049 Last data filed at 02/13/2022 0300 Gross per 24 hour  Intake 1400.28 ml  Output --  Net 1400.28 ml    Filed Weights   02/11/22 0500 02/12/22 0947 02/13/22 0431  Weight: 61.6 kg 62.7 kg 64.2 kg    Examination:  General: Looks comfortable.  On room air. Cardiovascular: S1-S2 normal.  Regular rate rhythm. Respiratory: Bilateral clear.  No added sounds. Gastrointestinal: Soft.  Nontender. Ext: No swelling or edema.  No cyanosis. Neuro: Alert oriented x4.  No neurological deficits. Musculoskeletal: No deformities.    Data Reviewed: I have personally reviewed following labs and imaging studies  CBC: Recent Labs  Lab 02/09/22 1104 02/12/22 0502  WBC 8.4 5.7  NEUTROABS 4.2  --   HGB 13.1 11.7*  HCT 38.0 34.6*  MCV 94.5 96.4  PLT 126* 92*   Basic Metabolic Panel: Recent Labs  Lab 02/09/22 1104 02/10/22 1805 02/12/22 0502  NA 136 135 140  K 3.5 5.1 4.4  CL 100 100 103  CO2 '29 29 30  '$ GLUCOSE 100* 122* 114*  BUN '19 8 8  '$ CREATININE 0.68 0.78 0.61  CALCIUM 9.5 9.4 8.8*  MG 2.1  --   --    GFR: Estimated Creatinine Clearance: 66.6 mL/min (by C-G formula based on SCr of 0.61 mg/dL). Liver Function Tests: Recent Labs  Lab 02/12/22 0502  AST 14*  ALT 19  ALKPHOS 20*  BILITOT 0.6  PROT 4.5*  ALBUMIN 3.3*   No results for input(s): LIPASE, AMYLASE in the last 168 hours. No results for input(s): AMMONIA in the last 168 hours. Coagulation Profile: No results for input(s): INR, PROTIME in the last 168 hours. Cardiac Enzymes: No results for input(s): CKTOTAL, CKMB, CKMBINDEX, TROPONINI in the last 168 hours. BNP (last 3 results) No results for input(s): PROBNP in the last 8760 hours. HbA1C: No results for input(s): HGBA1C in  the last 72 hours. CBG: No results for input(s): GLUCAP in the last 168 hours. Lipid Profile: No results for input(s): CHOL, HDL, LDLCALC, TRIG, CHOLHDL, LDLDIRECT in the last 72 hours. Thyroid Function Tests: No results for input(s): TSH, T4TOTAL, FREET4, T3FREE, THYROIDAB in the last 72 hours. Anemia Panel: No results for input(s): VITAMINB12, FOLATE, FERRITIN, TIBC, IRON, RETICCTPCT in the last 72 hours.  Sepsis Labs: No results for input(s): PROCALCITON, LATICACIDVEN in the last 168 hours.  Recent Results (from the past 240 hour(s))  CSF culture     Status: None   Collection Time: 02/04/22  1:18 PM   Specimen: Lumbar Puncture; Cerebrospinal Fluid  Result Value Ref Range Status   MICRO NUMBER: [id number]  Final   SPECIMEN QUALITY: Adequate  Final   Source CEREBROSPINAL FLUID  Final   STATUS: FINAL  Final   GRAM STAIN:   Final    Gram stain prepared by cytospin No organisms or white blood cells seen   Result: No Growth  Final  Fungus culture w smear     Status: None (Preliminary result)   Collection Time: 02/04/22  1:18 PM   Specimen: Lumbar Puncture; Cerebrospinal Fluid  Result Value Ref Range Status   MICRO NUMBER: [id number]  Preliminary   SPECIMEN QUALITY: Adequate  Preliminary   Source: CEREBROSPINAL FLUID  Preliminary   STATUS: PRELIMINARY  Preliminary   SMEAR: No fungal elements seen.  Preliminary   CULTURE:   Preliminary    No fungi isolated to date. Culture is examined weekly for a total of 28 days incubation. A change in status will result in an updated culture report.  Resp Panel by RT-PCR (Flu A&B, Covid) Nasopharyngeal Swab     Status: None   Collection Time: 02/09/22 11:04 AM   Specimen: Nasopharyngeal Swab; Nasopharyngeal(NP) swabs in vial transport medium  Result Value Ref Range Status   SARS Coronavirus 2 by RT PCR NEGATIVE NEGATIVE Final    Comment: (NOTE) SARS-CoV-2 target nucleic acids are NOT DETECTED.  The SARS-CoV-2 RNA is generally detectable in upper  respiratory specimens during the acute phase of infection. The lowest concentration of SARS-CoV-2 viral copies this assay can detect is 138 copies/mL. A negative result does not preclude SARS-Cov-2 infection and should not be used as the sole basis for treatment or other patient management decisions. A negative result may occur with  improper specimen collection/handling, submission of specimen other than nasopharyngeal swab, presence of viral mutation(s) within the areas targeted by this assay, and inadequate number of viral copies(<138 copies/mL). A negative result must be combined with clinical observations, patient history, and epidemiological information. The expected result is Negative.  Fact Sheet for Patients:  EntrepreneurPulse.com.au  Fact Sheet for Healthcare Providers:  IncredibleEmployment.be  This test is no t yet approved or cleared by the [place] FDA and  has been authorized for detection and/or diagnosis of SARS-CoV-2 by FDA under an Emergency Use Authorization (EUA). This EUA will remain  in effect (meaning  this test can be used) for the duration of the COVID-19 declaration under Section 564(b)(1) of the Act, 21 U.S.C.section 360bbb-3(b)(1), unless the authorization is terminated  or revoked sooner.       Influenza A by PCR NEGATIVE NEGATIVE Final   Influenza B by PCR NEGATIVE NEGATIVE Final    Comment: (NOTE) The Xpert Xpress SARS-CoV-2/FLU/RSV plus assay is intended as an aid in the diagnosis of influenza from Nasopharyngeal swab specimens and should not be used as a sole basis for treatment. Nasal washings and aspirates are unacceptable for Xpert Xpress SARS-CoV-2/FLU/RSV testing.  Fact Sheet for Patients: EntrepreneurPulse.com.au  Fact Sheet for Healthcare Providers: IncredibleEmployment.be  This test is not yet approved or cleared by the [place] FDA and has been  authorized for detection and/or diagnosis of SARS-CoV-2 by FDA under an Emergency Use Authorization (EUA). This EUA will remain in effect (meaning this test can be used) for the duration of the COVID-19 declaration under Section 564(b)(1) of the Act, 21 U.S.C. section 360bbb-3(b)(1), unless the authorization is terminated or revoked.  Performed at [facility], [place] [address], [place], [place] [address]          Radiology Studies: No results found.      Scheduled Meds:  apixaban  5 mg Oral BID   calcium-vitamin D  2 tablet Oral Daily   Chlorhexidine Gluconate Cloth  6 each Topical Daily   heparin sodium (porcine)  1,000 Units Intracatheter Once   heparin sodium (porcine)  1,000 Units Intracatheter Once   pantoprazole  40 mg Oral Daily   [START ON 02/18/2022] thiamine injection  100 mg Intravenous Daily   Continuous Infusions:  citrate dextrose     citrate dextrose     lactated ringers 50 mL/hr at 02/12/22 0134   sodium chloride 1,000 mL (02/12/22 0850)   thiamine injection 250 mg (02/12/22 0841)     LOS: 4 days    Time spent: 25 minutes   Barb Merino, MD [facility] Pager [phone]

## 2022-02-14 ENCOUNTER — Inpatient Hospital Stay (HOSPITAL_COMMUNITY): Payer: 59

## 2022-02-14 MED ORDER — SENNOSIDES-DOCUSATE SODIUM 8.6-50 MG PO TABS
1.0000 | ORAL_TABLET | Freq: Every evening | ORAL | Status: DC | PRN
  Administered 2022-02-14: 1 via ORAL
  Filled 2022-02-14: qty 1

## 2022-02-14 MED ORDER — LIDOCAINE 4 % EX CREA
TOPICAL_CREAM | Freq: Four times a day (QID) | CUTANEOUS | Status: DC | PRN
  Administered 2022-02-14: 1 via TOPICAL
  Filled 2022-02-14 (×2): qty 5

## 2022-02-14 MED ORDER — IOHEXOL 9 MG/ML PO SOLN
ORAL | Status: AC
  Administered 2022-02-14: 1000 mL
  Filled 2022-02-14: qty 1000

## 2022-02-14 NOTE — Progress Notes (Signed)
PROGRESS NOTE    Christine Brady  KDT:[id number] DOB: [birth year] DOA: 02/09/2022 PCP: Ronita Hipps, MD    Brief Narrative:  61 year old with history of DVT left leg, cochlear implant, chronic ITP, GERD who was recently diagnosed with GB syndrome 5 days ago with lumbar puncture presented to the emergency room with headache, numbness in her head and tongue, chronic numbness of the feet and calf.  Hemodynamically stable in the ER.  CT lumbar spine 2 weeks ago with abnormal lumbar nerve root thickening and nodularity, patient could not have MRI because of cochlear implant.  Admitted with neurology consultation.  Currently receiving plasma exchange.   Assessment & Plan:   Acute inflammatory demyelinating polyneuropathy: CT head with without contrast essentially normal. CT venogram of the head without any evidence of dural sinus thrombosis. Temporary dialysis catheter. Plasma exchange 3/1, 3/3, 3/4.  Improved intradialytic hypotension with IV fluid, Benadryl and Zofran.  Remains on high-dose thiamine.  Followed by neurology. 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  GERD: On PPI  ITP: Platelets stable.  DVT, postoperative: On Eliquis.   DVT prophylaxis:   Eliquis. apixaban (ELIQUIS) tablet 5 mg   Code Status: Full code Family Communication: None today. Disposition Plan: Status is: Inpatient Remains inpatient appropriate because: Inpatient procedures needed.  Planned for plasma exchange.   Consultants:  Neurology  Procedures:  Right IJ catheter, plasma exchange  Antimicrobials:  None   Subjective:  Seen and examined.  No new changes. Objective: Vitals:   02/13/22 2318 02/14/22 0320 02/14/22 0500 02/14/22 0800  BP: 104/64 (!) 93/59  115/65  Pulse: (!) 54 (!) 59  74  Resp: '18 16  17  '$ Temp: 98.6 F (37 C) 97.8 F (36.6 C)  98.1 F (36.7 C)  TempSrc: Oral Oral  Oral  SpO2: 98% 97%    Weight:   63.1 kg    Height:        Intake/Output Summary (Last 24 hours) at 02/14/2022 1009 Last data filed at 02/14/2022 0405 Gross per 24 hour  Intake --  Output 300 ml  Net -300 ml    Filed Weights   02/13/22 1446 02/13/22 1546 02/14/22 0500  Weight: 64.2 kg 64.5 kg 63.1 kg    Examination:  Patient looks comfortable.  Walking around in the room.    Data Reviewed: I have personally reviewed following labs and imaging studies  CBC: Recent Labs  Lab 02/09/22 1104 02/12/22 0502  WBC 8.4 5.7  NEUTROABS 4.2  --   HGB 13.1 11.7*  HCT 38.0 34.6*  MCV 94.5 96.4  PLT 126* 92*   Basic Metabolic Panel: Recent Labs  Lab 02/09/22 1104 02/10/22 1805 02/12/22 0502 02/13/22 1235  NA 136 135 140 139  K 3.5 5.1 4.4 4.2  CL 100 100 103 103  CO2 '29 29 30 27  '$ GLUCOSE 100* 122* 114* 99  BUN '19 8 8 9  '$ CREATININE 0.68 0.78 0.61 0.61  CALCIUM 9.5 9.4 8.8* 9.4  MG 2.1  --   --   --    GFR: Estimated Creatinine Clearance: 66.1 mL/min (by C-G formula based on SCr of 0.61 mg/dL). Liver Function Tests: Recent Labs  Lab 02/12/22 0502  AST 14*  ALT 19  ALKPHOS 20*  BILITOT 0.6  PROT 4.5*  ALBUMIN 3.3*   No results for input(s): LIPASE, AMYLASE in the last 168 hours. No results for input(s): AMMONIA in the last 168 hours. Coagulation  Profile: No results for input(s): INR, PROTIME in the last 168 hours. Cardiac Enzymes: No results for input(s): CKTOTAL, CKMB, CKMBINDEX, TROPONINI in the last 168 hours. BNP (last 3 results) No results for input(s): PROBNP in the last 8760 hours. HbA1C: No results for input(s): HGBA1C in the last 72 hours. CBG: No results for input(s): GLUCAP in the last 168 hours. Lipid Profile: No results for input(s): CHOL, HDL, LDLCALC, TRIG, CHOLHDL, LDLDIRECT in the last 72 hours. Thyroid Function Tests: No results for input(s): TSH, T4TOTAL, FREET4, T3FREE, THYROIDAB in the last 72 hours. Anemia Panel: No results for input(s): VITAMINB12, FOLATE, FERRITIN, TIBC,  IRON, RETICCTPCT in the last 72 hours.  Sepsis Labs: No results for input(s): PROCALCITON, LATICACIDVEN in the last 168 hours.  Recent Results (from the past 240 hour(s))  CSF culture     Status: None   Collection Time: 02/04/22  1:18 PM   Specimen: Lumbar Puncture; Cerebrospinal Fluid  Result Value Ref Range Status   MICRO NUMBER: [id number]  Final   SPECIMEN QUALITY: Adequate  Final   Source CEREBROSPINAL FLUID  Final   STATUS: FINAL  Final   GRAM STAIN:   Final    Gram stain prepared by cytospin No organisms or white blood cells seen   Result: No Growth  Final  Fungus culture w smear     Status: None (Preliminary result)   Collection Time: 02/04/22  1:18 PM   Specimen: Lumbar Puncture; Cerebrospinal Fluid  Result Value Ref Range Status   MICRO NUMBER: [id number]  Preliminary   SPECIMEN QUALITY: Adequate  Preliminary   Source: CEREBROSPINAL FLUID  Preliminary   STATUS: PRELIMINARY  Preliminary   SMEAR: No fungal elements seen.  Preliminary   CULTURE:   Preliminary    No fungi isolated to date. Culture is examined weekly for a total of 28 days incubation. A change in status will result in an updated culture report.  Resp Panel by RT-PCR (Flu A&B, Covid) Nasopharyngeal Swab     Status: None   Collection Time: 02/09/22 11:04 AM   Specimen: Nasopharyngeal Swab; Nasopharyngeal(NP) swabs in vial transport medium  Result Value Ref Range Status   SARS Coronavirus 2 by RT PCR NEGATIVE NEGATIVE Final    Comment: (NOTE) SARS-CoV-2 target nucleic acids are NOT DETECTED.  The SARS-CoV-2 RNA is generally detectable in upper respiratory specimens during the acute phase of infection. The lowest concentration of SARS-CoV-2 viral copies this assay can detect is 138 copies/mL. A negative result does not preclude SARS-Cov-2 infection and should not be used as the sole basis for treatment or other patient management decisions. A negative result may occur with  improper specimen  collection/handling, submission of specimen other than nasopharyngeal swab, presence of viral mutation(s) within the areas targeted by this assay, and inadequate number of viral copies(<138 copies/mL). A negative result must be combined with clinical observations, patient history, and epidemiological information. The expected result is Negative.  Fact Sheet for Patients:  EntrepreneurPulse.com.au  Fact Sheet for Healthcare Providers:  IncredibleEmployment.be  This test is no t yet approved or cleared by the [place] FDA and  has been authorized for detection and/or diagnosis of SARS-CoV-2 by FDA under an Emergency Use Authorization (EUA). This EUA will remain  in effect (meaning this test can be used) for the duration of the COVID-19 declaration under Section 564(b)(1) of the Act, 21 U.S.C.section 360bbb-3(b)(1), unless the authorization is terminated  or revoked sooner.       Influenza A by PCR NEGATIVE  NEGATIVE Final   Influenza B by PCR NEGATIVE NEGATIVE Final    Comment: (NOTE) The Xpert Xpress SARS-CoV-2/FLU/RSV plus assay is intended as an aid in the diagnosis of influenza from Nasopharyngeal swab specimens and should not be used as a sole basis for treatment. Nasal washings and aspirates are unacceptable for Xpert Xpress SARS-CoV-2/FLU/RSV testing.  Fact Sheet for Patients: EntrepreneurPulse.com.au  Fact Sheet for Healthcare Providers: IncredibleEmployment.be  This test is not yet approved or cleared by the [place] FDA and has been authorized for detection and/or diagnosis of SARS-CoV-2 by FDA under an Emergency Use Authorization (EUA). This EUA will remain in effect (meaning this test can be used) for the duration of the COVID-19 declaration under Section 564(b)(1) of the Act, 21 U.S.C. section 360bbb-3(b)(1), unless the authorization is terminated or revoked.  Performed at [facility], [place] [address] [place], [place] [address]          Radiology Studies: No results found.      Scheduled Meds:  apixaban  5 mg Oral BID   calcium-vitamin D  2 tablet Oral Daily   Chlorhexidine Gluconate Cloth  6 each Topical Daily   heparin sodium (porcine)  1,000 Units Intracatheter Once   pantoprazole  40 mg Oral Daily   [START ON 02/18/2022] thiamine injection  100 mg Intravenous Daily   Continuous Infusions:  citrate dextrose     citrate dextrose     citrate dextrose     lactated ringers 50 mL/hr at 02/13/22 1656   sodium chloride 1,000 mL (02/13/22 1308)   thiamine injection 250 mg (02/14/22 0934)     LOS: 5 days    Time spent: 25 minutes   Barb Merino, MD [facility] Pager [phone]

## 2022-02-14 NOTE — Progress Notes (Signed)
NEUROLOGY CONSULTATION PROGRESS NOTE  ? ?Date of service: February 14, 2022 ?Patient Name: Christine Brady ?MRN:  [id number] ?DOB:  02/13/1961 ? ?Brief HPI  ?Christine Brady is a 61 y.o. female with PMH significant for chronic ITP, HLD, arthritis and s/p recent R Knee replacement, DVTs in L popliteal vein in Dec 2022 and on Eliquis who presents with BL leg numbness that started in nov 2022 in her feet and was stable until February and now has numbness upto her waist along with numbness of her entire face. She now has to use a cane to walk. Outpatient workup with unable to get MRIs due to issues with her cochlear implant but CT Lumbar myelogram with abnormal lumbar nerve root thickening and nodularity. Outpatient LP with pleocytosis with WBC of 19, elevated protein to 109. ?  ?Interval Hx  ? ?She tolerated additional sessions of PLEX without any issues. ? ?Vitals  ? ?Vitals:  ? 02/13/22 2318 02/14/22 0320 02/14/22 0500 02/14/22 0800  ?BP: 104/64 (!) 93/59  115/65  ?Pulse: (!) 54 (!) 59  74  ?Resp: '18 16  17  '$ ?Temp: 98.6 ?F (37 ?C) 97.8 ?F (36.6 ?C)  98.1 ?F (36.7 ?C)  ?TempSrc: Oral Oral  Oral  ?SpO2: 98% 97%    ?Weight:   63.1 kg   ?Height:      ?  ? ?Body mass index is 25.44 kg/m?. ? ?Physical Exam  ? ?General: Laying comfortably in bed; in no acute distress.  ?HENT: Normal oropharynx and mucosa. Normal external appearance of ears and nose.  ?Neck: Supple, no pain or tenderness  ?CV: No JVD. No peripheral edema.  ?Pulmonary: Symmetric Chest rise. Normal respiratory effort.  ?Abdomen: Soft to touch, non-tender.  ?Ext: No cyanosis, edema, or deformity  ?Skin: No rash. Normal palpation of skin.   ?Musculoskeletal: Normal digits and nails by inspection. No clubbing.  ? ?Neurologic Examination  ?Mental status/Cognition: Alert, oriented to self, place, month and year, good attention.  ?Speech/language: Fluent, comprehension intact, object naming intact, repetition intact.  ?Cranial nerves:  ? CN II Pupils equal and reactive  to light, no VF deficits   ? CN III,IV,VI EOM intact, no gaze preference or deviation, no nystagmus   ? CN V normal sensation in V1, V2, and V3 segments bilaterally   ? CN VII no asymmetry, no nasolabial fold flattening   ? CN VIII normal hearing to speech   ? CN IX & X normal palatal elevation, no uvular deviation   ? CN XI 5/5 head turn and 5/5 shoulder shrug bilaterally   ? CN XII midline tongue protrusion   ? ?Motor:  ?Muscle bulk: normal, tone normal, pronator drift none tremor none ?Mvmt Root Nerve  Muscle Right Left Comments  ?SA C5/6 Ax Deltoid 5 5   ?EF C5/6 Mc Biceps 5 5   ?EE C6/7/8 Rad Triceps 5 5   ?WF C6/7 Med FCR     ?WE C7/8 PIN ECU     ?F Ab C8/T1 U ADM/FDI 5 5   ?HF L1/2/3 Fem Illopsoas 5 5   ?KE L2/3/4 Fem Quad 5 5   ?DF L4/5 D Peron Tib Ant 5 5   ?PF S1/2 Tibial Grc/Sol 5 5   ? ?Sensation: ? Light touch Numbness in BL feet.  ? Pin prick   ? Temperature   ? Vibration   ?Proprioception   ? ?Coordination/Complex Motor:  ?- Finger to Nose intact BL ?- Heel to shin intact BL ?- Rapid alternating  movement are slowed in BL feet. ?- Gait: Stride length short. Arm swing poor. Base width narrow. ? ?Labs  ? ?Basic Metabolic Panel:  ?Lab Results  ?Component Value Date  ? NA 139 02/13/2022  ? K 4.2 02/13/2022  ? CO2 27 02/13/2022  ? GLUCOSE 99 02/13/2022  ? BUN 9 02/13/2022  ? CREATININE 0.61 02/13/2022  ? CALCIUM 9.4 02/13/2022  ? GFRNONAA >60 02/13/2022  ? GFRAA >60 07/12/2019  ? ?HbA1c:  ?Lab Results  ?Component Value Date  ? HGBA1C 5.4 02/04/2022  ? ?LDL: No results found for: LDLCALC ?Urine Drug Screen: No results found for: LABOPIA, COCAINSCRNUR, [place], [place], THCU, LABBARB  ?Alcohol Level No results found for: ETH ?No results found for: PHENYTOIN, ZONISAMIDE, LAMOTRIGINE, LEVETIRACETA ?No results found for: PHENYTOIN, PHENOBARB, VALPROATE, CBMZ ? ?Imaging and Diagnostic studies  ? ?CT myelogram lumbar spine (2/15): abnormal lumbar nerve root thickening and nodularity ?  ?[place] w and wo contrast, CT  venogram (2/28): no acute anomaly, no evidence of dural sinus thrombosis ?  ?MRI-Brain: unable to obtain due to cochlear implant ? ?Significant Labs:  ?CSF WBC: 19 ?CSF Protein: 109 ?Oligoclonal bands: 3 matched bands. 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 ?ENA panel: mildly elevated RNP. ? ?Impression  ? ?Christine Brady is a 61 y.o. female with PMH significant for PMH significant for chronic ITP, HLD, arthritis and s/p recent R Knee replacement, DVTs in L popliteal vein in Dec 2022 and on Eliquis who presents with BL leg numbness that started in nov 2022 in her feet and was stable until February and now has numbness upto her waist along with numbness of her entire face. Her symptoms are concerning for potential small fiber neuropathy. Her neurologic examination is notable for numbness and pain in BL feet and feeling off balance. ? ?Workup so far with mildly elevated RNP which I do not think is significant. Her Oligoclonal bands shows 3 identical gamma restriction bands in CSF and serum. This suggests that the antibody production is systemic rather than in the CNS. 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 She does not seem to be report symptoms consistent with neuromyotonia. ? ?CASPR2 Ab associated disorder can present with peripheral nerve hyperexcitability and neuropathic pain.([email or web address]) ? ?Recommendations  ?- Pregabalin made her sleepy and she did not like it. Will try Lidocaine cream today. If this does not work, can try Cymbalta. She is hesitant to try Gabapentin. ?- Will get CT Chest, Abdomen  and Pelvis with contrast to evaluate for occult malignancy. ?- She is s/p 3/5 PLEX session. She has been tolerating this with pretreatment with Tylenol, Benadryl, Zofran and 1L Fluid bolus prior to PLEX. ?- We will continue to follow along. ?______________________________________________________________________ ? ? ?Thank you for the opportunity to take part in the care of this patient. If you have any further questions, please contact the neurology consultation attending. ? ?Signed, ? ?Donnetta Simpers ?[facility] ?Pager Number [phone] ? ?

## 2022-02-15 LAB — PROTEIN ELECTROPHORESIS, SERUM
A/G Ratio: 1.4 (ref 0.7–1.7)
Albumin ELP: 3.3 g/dL (ref 2.9–4.4)
Alpha-1-Globulin: 0.2 g/dL (ref 0.0–0.4)
Alpha-2-Globulin: 0.6 g/dL (ref 0.4–1.0)
Beta Globulin: 0.7 g/dL (ref 0.7–1.3)
Gamma Globulin: 0.8 g/dL (ref 0.4–1.8)
Globulin, Total: 2.4 g/dL (ref 2.2–3.9)
Total Protein ELP: 5.7 g/dL — ABNORMAL LOW (ref 6.0–8.5)

## 2022-02-15 LAB — BASIC METABOLIC PANEL
Anion gap: 5 (ref 5–15)
BUN: 8 mg/dL (ref 6–20)
CO2: 30 mmol/L (ref 22–32)
Calcium: 8.7 mg/dL — ABNORMAL LOW (ref 8.9–10.3)
Chloride: 104 mmol/L (ref 98–111)
Creatinine, Ser: 0.63 mg/dL (ref 0.44–1.00)
GFR, Estimated: >60 mL/min (ref >60)
Glucose, Bld: 107 mg/dL — ABNORMAL HIGH (ref 70–99)
Potassium: 4.2 mmol/L (ref 3.5–5.1)
Sodium: 139 mmol/L (ref 135–145)

## 2022-02-15 LAB — CBC
HCT: 33.3 % — ABNORMAL LOW (ref 36.0–46.0)
Hemoglobin: 11.3 g/dL — ABNORMAL LOW (ref 12.0–15.0)
MCH: 33 pg (ref 26.0–34.0)
MCHC: 33.9 g/dL (ref 30.0–36.0)
MCV: 97.4 fL (ref 80.0–100.0)
Platelets: 84 10*3/uL — ABNORMAL LOW (ref 150–400)
RBC: 3.42 MIL/uL — ABNORMAL LOW (ref 3.87–5.11)
RDW: 12.8 % (ref 11.5–15.5)
WBC: 5.6 10*3/uL (ref 4.0–10.5)
nRBC: 0 % (ref 0.0–0.2)

## 2022-02-15 LAB — NEUROMYELITIS OPTICA AUTOAB, IGG: NMO-IgG: <1.5 U/mL (ref 0.0–3.0)

## 2022-02-15 MED ORDER — CALCIUM GLUCONATE-NACL 2-0.675 GM/100ML-% IV SOLN
2.0000 g | Freq: Once | INTRAVENOUS | Status: AC
  Administered 2022-02-15: 2000 mg via INTRAVENOUS
  Filled 2022-02-15: qty 100

## 2022-02-15 MED ORDER — GABAPENTIN 100 MG PO CAPS
100.0000 mg | ORAL_CAPSULE | Freq: Every day | ORAL | Status: DC
  Administered 2022-02-15 – 2022-02-18 (×4): 100 mg via ORAL
  Filled 2022-02-15 (×4): qty 1

## 2022-02-15 MED ORDER — ACETAMINOPHEN 325 MG PO TABS
650.0000 mg | ORAL_TABLET | ORAL | Status: DC | PRN
  Administered 2022-02-15: 650 mg via ORAL

## 2022-02-15 MED ORDER — HEPARIN SODIUM (PORCINE) 1000 UNIT/ML IJ SOLN: 1000.0000 [IU] | Freq: Once | INTRAMUSCULAR | Status: DC

## 2022-02-15 MED ORDER — ACD FORMULA A 0.73-2.45-2.2 GM/100ML VI SOLN
1000.0000 mL | Status: DC
  Administered 2022-02-15: 1000 mL
  Filled 2022-02-15: qty 1000

## 2022-02-15 MED ORDER — CALCIUM CARBONATE ANTACID 500 MG PO CHEW
400.0000 mg | CHEWABLE_TABLET | Freq: Once | ORAL | Status: AC
  Administered 2022-02-15: 400 mg via ORAL
  Filled 2022-02-15: qty 2

## 2022-02-15 MED ORDER — CALCIUM CARBONATE ANTACID 500 MG PO CHEW
2.0000 | CHEWABLE_TABLET | ORAL | Status: DC
  Administered 2022-02-15: 400 mg via ORAL
  Filled 2022-02-15: qty 2

## 2022-02-15 MED ORDER — DIPHENHYDRAMINE HCL 25 MG PO CAPS
25.0000 mg | ORAL_CAPSULE | Freq: Four times a day (QID) | ORAL | Status: DC | PRN
  Administered 2022-02-15: 25 mg via ORAL

## 2022-02-15 MED ORDER — SODIUM CHLORIDE 0.9 % IV SOLN
INTRAVENOUS | Status: AC
  Filled 2022-02-15 (×3): qty 200

## 2022-02-15 NOTE — Plan of Care (Signed)
Neurology Brief Plan of Care ? ?Subjective: Patient with complaints of burning in bilateral lower extremities with spasms at night that are rated 10/10 in severity. She states the lidocaine minimally improved symptoms of burning.  ?Patient admits that she was hesitant to try gabapentin due to feeling that gabapentin had caused her symptoms that lead to her hospitalization, though she is no longer resistant to this medication. She states that she would like a night-time medication for her symptoms but feel that her symptoms are well controlled during the day. ?On day 4/5 of PLEX and tolerating treatments well with Tylenol, Benadryl, Zofran and 1L Fluid bolus prior to PLEX. ? ?Assessment: 61 y.o. female with PMH significant for PMH significant for chronic ITP, HLD, arthritis and s/p recent R Knee replacement, DVTs in L popliteal vein in Dec 2022 and on Eliquis who presents with BL leg numbness that started in Nov. 2022 in her feet and was stable until February and now has numbness up to her waist along with numbness of her entire face. Her symptoms are concerning for potential small fiber neuropathy. Her neurologic examination is notable for numbness and pain in BL feet and feeling off balance. ?  ?Work-up so far with mildly elevated RNP which I do not think is significant. Her Oligoclonal bands shows 3 identical gamma restriction bands in CSF and serum. This suggests that the antibody production is systemic rather than in the CNS. 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 She does not seem to be report symptoms consistent with neuromyotonia. ?  ?CASPR2 Ab  associated disorder can present with peripheral nerve hyperexcitability and neuropathic pain. ([email or web address]) ? ?Plan:  ?- Pregabalin made her sleepy and she did not like it. Initially felt Gabapentin caused her symptom onset but is willing to retry this medication. Lidocaine with minimal improvement. Will try Gabapentin 100 mg PO qhs. If this does not work, can try Cymbalta. She is hesitant to try Gabapentin.  ?- CT Chest, Abdomen and Pelvis with contrast pending to evaluate for occult malignancy. ?- She is s/p 3/5 PLEX session. She has been tolerating this with pretreatment with Tylenol, Benadryl, Zofran and 1L Fluid bolus prior to PLEX. Planned for session 4/5 today.  ?- We will continue to follow along. ? ?Anibal Henderson, AGACNP-BC ?[facility] ?[phone] ? ?No charge note.  ?

## 2022-02-15 NOTE — Progress Notes (Signed)
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  Hemodynamically stable in the ER.  CT lumbar spine 2 weeks ago with abnormal lumbar nerve root thickening and nodularity, patient could not have MRI because of cochlear implant.  Admitted with neurology consultation.  Currently receiving plasma exchange.   Assessment & Plan:   Acute inflammatory demyelinating polyneuropathy: CT head with without contrast essentially normal. CT venogram of the head without any evidence of dural sinus thrombosis. Temporary dialysis catheter. Plasma exchange 3/1, 3/3, 3/4 and 3/6.  Improved intradialytic hypotension with IV fluid, Benadryl and Zofran.  Remains on high-dose thiamine.  Followed by neurology. 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 Symptomatic treatment with low-dose gabapentin at night.  Occasional oxycodone and lidocaine ointment for peripheral neuropathy.  GERD: On PPI  ITP: Platelets stable.  DVT, postoperative: On Eliquis.   DVT prophylaxis:   Eliquis. apixaban (ELIQUIS) tablet 5 mg   Code Status: Full code Family Communication: None today. Disposition Plan: Status is: Inpatient Remains inpatient appropriate because: Inpatient procedures needed.  Undergoing plasma exchange.   Consultants:  Neurology  Procedures:  Right IJ catheter, plasma exchange  Antimicrobials:  None   Subjective:  Seen and examined.  Little tired after the procedure but went uneventful.  Legs are hurting and paresthetic.  Objective: Vitals:   02/15/22 1020 02/15/22 1043 02/15/22 1112 02/15/22  1114  BP:  (!) 92/58 (!) 86/52 (!) 89/50  Pulse: (!) 55 62 61 62  Resp: '19 12 14   '$ Temp:  97.6 F (36.4 C) 97.9 F (36.6 C)   TempSrc:  Oral Oral   SpO2: 100% 99% 99% 99%  Weight:      Height:        Intake/Output Summary (Last 24 hours) at 02/15/2022 1437 Last data filed at 02/15/2022 0800 Gross per 24 hour  Intake 120 ml  Output 0 ml  Net 120 ml    Filed Weights   02/13/22 1546 02/14/22 0500 02/15/22 0855  Weight: 64.5 kg 63.1 kg 63.3 kg    Examination:  Patient looks comfortable.  On room air.    Data Reviewed: I have personally reviewed following labs and imaging studies  CBC: Recent Labs  Lab 02/09/22 1104 02/12/22 0502 02/15/22 0655  WBC 8.4 5.7 5.6  NEUTROABS 4.2  --   --   HGB 13.1 11.7* 11.3*  HCT 38.0 34.6* 33.3*  MCV 94.5 96.4 97.4  PLT 126* 92* 84*   Basic Metabolic Panel: Recent Labs  Lab 02/09/22 1104 02/10/22 1805 02/12/22 0502 02/13/22 1235 02/15/22 0655  NA 136 135 140 139 139  K 3.5 5.1 4.4 4.2 4.2  CL 100 100 103 103 104  CO2 '29 29 30 27 30  '$ GLUCOSE 100* 122* 114* 99 107*  BUN '19 8 8 9 8  '$ CREATININE 0.68 0.78 0.61 0.61 0.63  CALCIUM 9.5 9.4 8.8* 9.4 8.7*  MG 2.1  --   --   --   --    GFR: Estimated Creatinine Clearance: 66.2 mL/min (by C-G formula based on SCr of 0.63 mg/dL). Liver Function Tests: Recent Labs  Lab 02/12/22 0502  AST 14*  ALT 19  ALKPHOS 20*  BILITOT 0.6  PROT 4.5*  ALBUMIN 3.3*   No results for input(s): LIPASE, AMYLASE in the last 168 hours. No results for input(s): AMMONIA in the last 168 hours. Coagulation Profile: No results for input(s): INR, PROTIME in the last 168 hours. Cardiac Enzymes: No results for input(s): CKTOTAL, CKMB, CKMBINDEX, TROPONINI in the last 168 hours. BNP (last 3 results) No results for input(s): PROBNP in the last 8760 hours. HbA1C: No results for input(s): HGBA1C in the last 72 hours. CBG: No results for input(s): GLUCAP in the last 168 hours. Lipid Profile: No  results for input(s): CHOL, HDL, LDLCALC, TRIG, CHOLHDL, LDLDIRECT in the last 72 hours. Thyroid Function Tests: No results for input(s): TSH, T4TOTAL, FREET4, T3FREE, THYROIDAB in the last 72 hours. Anemia Panel: No results for input(s): VITAMINB12, FOLATE, FERRITIN, TIBC, IRON, RETICCTPCT in the last 72 hours.  Sepsis Labs: No results for input(s): PROCALCITON, LATICACIDVEN in the last 168 hours.  Recent Results (from the past 240 hour(s))  Resp Panel by RT-PCR (Flu A&B, Covid) Nasopharyngeal Swab     Status: None   Collection Time: 02/09/22 11:04 AM   Specimen: Nasopharyngeal Swab; Nasopharyngeal(NP) swabs in vial transport medium  Result Value Ref Range Status   SARS Coronavirus 2 by RT PCR NEGATIVE NEGATIVE Final    Comment: (NOTE) SARS-CoV-2 target nucleic acids are NOT DETECTED.  The SARS-CoV-2 RNA is generally detectable in upper respiratory specimens during the acute phase of infection. The lowest concentration of SARS-CoV-2 viral copies this assay can detect is 138 copies/mL. A negative result does not preclude SARS-Cov-2 infection and should not be used as the sole basis for treatment or other patient management decisions. A negative result may occur with  improper specimen collection/handling, submission of specimen other than nasopharyngeal swab, presence of viral mutation(s) within the areas targeted by this assay, and inadequate number of viral copies(<138 copies/mL). A negative result must be combined with clinical observations, patient history, and epidemiological information. The expected result is Negative.  Fact Sheet for Patients:  EntrepreneurPulse.com.au  Fact Sheet for Healthcare Providers:  IncredibleEmployment.be  This test is no t yet approved or cleared by the [place] FDA and  has been authorized for detection and/or diagnosis of SARS-CoV-2 by FDA under an Emergency Use Authorization (EUA). This EUA will  remain  in effect (meaning this test can be used) for the duration of the COVID-19 declaration under Section 564(b)(1) of the Act, 21 U.S.C.section 360bbb-3(b)(1), unless the authorization is terminated  or revoked sooner.       Influenza A by PCR NEGATIVE NEGATIVE Final   Influenza B by PCR NEGATIVE NEGATIVE Final    Comment: (NOTE) The Xpert Xpress SARS-CoV-2/FLU/RSV plus assay is intended as an aid in the diagnosis of influenza from Nasopharyngeal swab specimens and should not be used as a sole basis for treatment. Nasal washings and aspirates are unacceptable for Xpert Xpress SARS-CoV-2/FLU/RSV testing.  Fact Sheet for Patients: EntrepreneurPulse.com.au  Fact Sheet for Healthcare Providers: IncredibleEmployment.be  This test is not yet approved or cleared by the [place] FDA and has been authorized for detection and/or diagnosis of SARS-CoV-2 by FDA under an Emergency Use Authorization (EUA). This EUA will remain in effect (meaning this test can be used) for the duration of the COVID-19 declaration under Section 564(b)(1) of the Act, 21 U.S.C. section 360bbb-3(b)(1), unless the authorization is terminated or revoked.  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 EXAM: CT CHEST, ABDOMEN, AND PELVIS WITH CONTRAST TECHNIQUE: Multidetector CT imaging of the chest, abdomen and pelvis was performed following the standard protocol during bolus administration of intravenous contrast. RADIATION DOSE REDUCTION: This exam was performed according to the departmental dose-optimization program which includes automated exposure control, adjustment of the mA and/or kV according to  patient size and/or use of iterative reconstruction technique. CONTRAST:  67m OMNIPAQUE IOHEXOL 350 MG/ML SOLN COMPARISON:  05/04/2021 FINDINGS: CT CHEST FINDINGS Cardiovascular: No significant vascular findings. Normal heart size. No pericardial effusion. Mediastinum/Nodes: No enlarged mediastinal, hilar, or axillary lymph nodes. Thyroid gland, trachea, and esophagus demonstrate no significant findings. Lungs/Pleura: Lungs are clear. No pleural effusion or pneumothorax. Musculoskeletal: No chest wall mass or suspicious bone lesions identified. CT ABDOMEN PELVIS FINDINGS Hepatobiliary: No focal liver abnormality is seen. No gallstones, gallbladder wall thickening, or biliary dilatation. Pancreas: Unremarkable. No pancreatic ductal dilatation or surrounding inflammatory changes. Spleen: Normal in size without focal abnormality. Adrenals/Urinary Tract: Adrenal glands are unremarkable. Kidneys are normal, without renal calculi, focal lesion, or hydronephrosis. Bladder is unremarkable. Stomach/Bowel: Stomach is within normal limits. Right colectomy. Large amount of stool in the remainder of the colon. No evidence of bowel wall thickening, distention, or inflammatory changes. Stable 3 cm fat attenuating nodular area in the anti mesenteric aspect of the sigmoid colon in the right lower quadrant likely reflecting an area of fat necrosis or old epiploic appendagitis. Vascular/Lymphatic: No significant vascular findings are present. No enlarged abdominal or pelvic lymph nodes. Reproductive: Uterus and bilateral adnexa are unremarkable. Other: No abdominal wall hernia or abnormality. No abdominopelvic ascites. Musculoskeletal: No acute osseous abnormality. No aggressive osseous lesion. IMPRESSION: 1. No acute abnormality in the chest, abdomen or pelvis. 2. Right colectomy. Large amount of stool in the remainder of the colon. Electronically Signed   By: HKathreen DevoidM.D.   On: 02/14/2022 13:05        Scheduled Meds:   apixaban  5 mg Oral BID   calcium-vitamin D  2 tablet Oral Daily   Chlorhexidine Gluconate Cloth  6 each Topical Daily   gabapentin  100 mg Oral QHS   pantoprazole  40 mg Oral Daily   [START ON 02/18/2022] thiamine injection  100 mg Intravenous Daily   Continuous Infusions:  lactated ringers 50 mL/hr at 02/13/22 1656   sodium chloride 1,000 mL (02/15/22 0645)   thiamine injection 250 mg (02/15/22 1121)     LOS: 6 days    Time spent: 20 minutes   KBarb Merino MD [facility] Pager [phone]

## 2022-02-15 NOTE — Progress Notes (Signed)
Patient has been ambulatory, walking in the hallway with a cane independently.  While ambulating, patient c/o tingling sensation on both hands, right > left, with good sensation on both hands.  Denies other complaints.  Neurology and primary attending made aware. ?

## 2022-02-16 NOTE — Consult Note (Signed)
? ?  [facility] CM Inpatient Consult ? ? ?02/16/2022 ? ?ANYELY CUNNING ?03/14/1961 ?[id number] ? ? [facility] Patient: Christine Brady ? ?Primary Care Provider:  Ronita Hipps, MD, [place] is an Embedded provider with a Chronic Care Management program and listed to provide the Transition of Care [TOC] follow up calls and appointment. ? ? ? Brady: Patient will be followed for any post hospital care management needs.   ? ?For additional questions or referrals please contact: ?  ?Natividad Brood, RN BSN CCM ?[facility] Liaison ? [phone] business mobile phone ?Toll free office [phone]  ?Fax number: [phone] ?[email or web address]  ? ?

## 2022-02-16 NOTE — Plan of Care (Signed)

## 2022-02-16 NOTE — Evaluation (Signed)
Physical Therapy Evaluation ?Patient Details ?Name: Christine Brady ?MRN: [id number] ?DOB: [birth year] ?Today's Date: 02/16/2022 ? ?History of Present Illness ? Christine Brady is a 61 y.o. 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 PMH:  abdominal pain, osteoarthritis, atypical chest pain, history of bowel obstruction, cholesteatoma right ear, cochlear implant, chronic headaches, chronic ITP, hyperlipidemia, large ovary, GERD, R TKA in 10/22 ?  ?Clinical Impression ? Pt admitted with above. Pt with noted bilat LE weakness and bilat hands/feet numbness. Pt lives alone and has been functioning mod I with use of cane and utilizing caution. Pt with recent R TKA in which has been difficulty to strengthen the quad with the recent GB diagnosis. Hopefully with the plasma treatment pt will begin to see improved ability to strengthen bilat LEs and diminished numbness/tingling in hands and feet. Pt to benefit from tub bench for safe transfers in/out of tub and an adjustable straight cane as pt currently has a fixed height cane that is too tall and beginning to cause shoulder problems in her L UE. Acute PT to cont to follow.   ?   ? ?Recommendations for follow up therapy are one component of a multi-disciplinary discharge planning process, led by the attending physician.  Recommendations may be updated based on patient status, additional functional criteria and insurance authorization. ? ?Follow Up Recommendations Outpatient PT -continue with current therapy but increase frequency ? ?  ?Assistance Recommended at Discharge PRN  ?Patient can return home with the following ? Assist for transportation;Assistance with cooking/housework ? ?  ?Equipment Recommendations Cane (tub bench)  ?Recommendations for Other Services ?    ?  ?Functional Status Assessment Patient has had a recent decline in their functional status and demonstrates  the ability to make significant improvements in function in a reasonable and predictable amount of time.  ? ?  ?Precautions / Restrictions Precautions ?Precautions: Fall ?Precaution Comments: bilat LE numbness ?Restrictions ?Weight Bearing Restrictions: No  ? ?  ? ?Mobility ? Bed Mobility ?Overal bed mobility: Modified Independent ?  ?  ?  ?  ?  ?  ?General bed mobility comments: HOB elevated, no difficulty ?  ? ?Transfers ?Overall transfer level: Modified independent ?Equipment used: Straight cane ?  ?  ?  ?  ?  ?  ?  ?General transfer comment: slow, guarded, cautious, no physical assist needed ?  ? ?Ambulation/Gait ?Ambulation/Gait assistance: Supervision ?Gait Distance (Feet): 200 Feet ?Assistive device: Straight cane ?Gait Pattern/deviations: Step-through pattern, Decreased stride length ?Gait velocity: dec ?Gait velocity interpretation: 1.31 - 2.62 ft/sec, indicative of limited community ambulator ?  ?General Gait Details: pt slow and guarded, unable to increase step length due to R knee instabilty due to inability to strengthen quad due to new GB onset, pt with good sequencing of the cane with R foot ? ?Stairs ?Stairs: Yes ?Stairs assistance: Min guard ?Stair Management: One rail Right, With cane ?Number of Stairs: 2 ?General stair comments: slow and guarded, very dependent on cane and rail due to bilat LE weakness, R > L ? ?Wheelchair Mobility ?  ? ?Modified Rankin (Stroke Patients Only) ?  ? ?  ? ?Balance Overall balance assessment: Needs assistance ?Sitting-balance support: No upper extremity supported, Feet supported ?Sitting balance-Leahy Scale: Normal ?  ?  ?Standing balance support: Single extremity supported, During functional activity ?Standing balance-Leahy Scale: Fair ?Standing balance comment: pt requires use of cane for  safe amb ?  ?  ?  ?  ?  ?  ?  ?  ?  ?  ?  ?   ? ? ? ?Pertinent Vitals/Pain Pain Assessment ?Pain Assessment: 0-10 ?Pain Score: 3  ?Pain Location: headache, c/o bilat LE pain and  spasm at night requiring pain meds ?Pain Descriptors / Indicators: Spasm, Headache ?Pain Intervention(s): Premedicated before session  ? ? ?Home Living Family/patient expects to be discharged to:: Private residence ?Living Arrangements: Alone ?Available Help at Discharge: Friend(s);Available PRN/intermittently ?Type of Home: House ?Home Access: Stairs to enter ?Entrance Stairs-Rails: None (uses a pole) ?Entrance Stairs-Number of Steps: 2 ?  ?Home Layout: One level ?Home Equipment: Rolling Walker (2 wheels) (straight cane - but it's too tall) ?   ?  ?Prior Function Prior Level of Function : Independent/Modified Independent;Needs assist;Driving ?  ?  ?  ?  ?  ?  ?Mobility Comments: pt has had to use a cane recently because of the bilat LE numbness, pt with difficulty stepping over tub to take a shower ?ADLs Comments: pt reports difficulty driving and getting in/out of tub, has been working okay with use of cane ?  ? ? ?Hand Dominance  ? Dominant Hand: Right ? ?  ?Extremity/Trunk Assessment  ? Upper Extremity Assessment ?Upper Extremity Assessment: Generalized weakness (numbness/tingling in hands) ?  ? ?Lower Extremity Assessment ?Lower Extremity Assessment: RLE deficits/detail;LLE deficits/detail ?RLE Deficits / Details: generalized weakness, grossly 3+/5, noted pt to have had recent TKA, pt with numbness but reports to be better than yesterday ?RLE Sensation: decreased light touch ?LLE Deficits / Details: generalized weakness, grossly 4/5, numbness ?LLE Sensation: decreased light touch ?  ? ?Cervical / Trunk Assessment ?Cervical / Trunk Assessment: Normal  ?Communication  ? Communication: No difficulties  ?Cognition Arousal/Alertness: Awake/alert ?Behavior During Therapy: [facility] for tasks assessed/performed ?Overall Cognitive Status: Within Functional Limits for tasks assessed ?  ?  ?  ?  ?  ?  ?  ?  ?  ?  ?  ?  ?  ?  ?  ?  ?  ?  ?  ? ?  ?General Comments General comments (skin integrity, edema, etc.): VSS ? ?   ?Exercises    ? ?Assessment/Plan  ?  ?PT Assessment Patient needs continued PT services  ?PT Problem List Decreased strength;Decreased activity tolerance;Decreased balance;Decreased mobility ? ?   ?  ?PT Treatment Interventions Functional mobility training;DME instruction;Gait training;Stair training;Therapeutic activities;Therapeutic exercise;Balance training   ? ?PT Goals (Current goals can be found in the Care Plan section)  ?Acute Rehab PT Goals ?Patient Stated Goal: home ?PT Goal Formulation: With patient ?Time For Goal Achievement: 03/02/22 ?Potential to Achieve Goals: Good ? ?  ?Frequency Min 4X/week ?  ? ? ?Co-evaluation   ?  ?  ?  ?  ? ? ?  ?AM-PAC PT "6 Clicks" Mobility  ?Outcome Measure Help needed turning from your back to your side while in a flat bed without using bedrails?: None ?Help needed moving from lying on your back to sitting on the side of a flat bed without using bedrails?: None ?Help needed moving to and from a bed to a chair (including a wheelchair)?: None ?Help needed standing up from a chair using your arms (e.g., wheelchair or bedside chair)?: A Little ?Help needed to walk in hospital room?: A Little ?Help needed climbing 3-5 steps with a railing? : A Little ?6 Click Score: 21 ? ?  ?End of Session Equipment Utilized During Treatment: Gait  belt ?Activity Tolerance: Patient tolerated treatment well ?Patient left: in bed;with call bell/phone within reach;with nursing/sitter in room (sat EOB, neuro NP present) ?Nurse Communication: Mobility status ?PT Visit Diagnosis: Unsteadiness on feet (R26.81);Other abnormalities of gait and mobility (R26.89);Repeated falls (R29.6);Muscle weakness (generalized) (M62.81);Difficulty in walking, not elsewhere classified (R26.2) ?  ? ?Time: 1031-5945 ?PT Time Calculation (min) (ACUTE ONLY): 34 min ? ? ?Charges:   PT Evaluation ?$PT Eval Moderate Complexity: 1 Mod ?PT Treatments ?$Gait Training: 8-22 mins ?  ?   ? ? ?Kittie Plater, PT, DPT ?Acute  Rehabilitation Services ?Pager #: [phone] ?Office #: [phone] ? ? ?Cabot Cromartie M Adali Pennings ?02/16/2022, 10:24 AM ? ?

## 2022-02-16 NOTE — Progress Notes (Signed)
PROGRESS NOTE    Christine Brady  HQP:[id number] DOB: 23-Nov-1961 DOA: 02/09/2022 PCP: Ronita Hipps, MD    Brief Narrative:  61 year old with history of DVT left leg, cochlear implant, chronic ITP, GERD who was recently diagnosed with GB syndrome 5 days ago with lumbar puncture presented to the emergency room with headache, numbness in her head and tongue, chronic numbness of the feet and calf.  Hemodynamically stable in the ER.  CT lumbar spine 2 weeks ago with abnormal lumbar nerve root thickening and nodularity, patient could not have MRI because of cochlear implant.  Admitted with neurology consultation.  Currently receiving plasma exchange.   Assessment & Plan:   Acute inflammatory demyelinating polyneuropathy: CT head with without contrast essentially normal. CT venogram of the head without any evidence of dural sinus thrombosis. Temporary dialysis catheter. Plasma exchange 3/1, 3/3, 3/4 and 3/6.  Next plasma exchange planned for 3/8.  Improved intradialytic hypotension with IV fluid, Benadryl and Zofran.  Remains on high-dose thiamine.  Followed by neurology. 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 Symptomatic treatment with low-dose gabapentin at night.  Occasional oxycodone and lidocaine ointment for peripheral neuropathy. Some improvement of peripheral symptoms today.  GERD: On PPI  ITP: Platelets stable.  DVT, postoperative: On Eliquis.   DVT prophylaxis:   Eliquis. apixaban (ELIQUIS) tablet 5 mg   Code Status: Full code Family Communication: None. Disposition Plan: Status is: Inpatient Remains inpatient appropriate because: Inpatient procedures needed.  Undergoing plasma exchange.   Consultants:  Neurology  Procedures:  Right IJ catheter, plasma exchange  Antimicrobials:  None   Subjective: Seen and examined.  Had some paresthesias yesterday after procedure and felt fatigued but overnight no  issues. She feels that sensations on her feet are somehow better today compared to yesterday.  Walking around.  Objective: Vitals:   02/15/22 1900 02/15/22 2327 02/16/22 0413 02/16/22 0759  BP: (!) 93/58 (!) 91/46 (!) 91/52 (!) 108/55  Pulse: 72 64 69 76  Resp: '14 16 16 17  '$ Temp: 99 F (37.2 C) 98.7 F (37.1 C) 98.9 F (37.2 C) 98.7 F (37.1 C)  TempSrc: Oral Oral Oral Oral  SpO2: 97% 99% 95% 98%  Weight:      Height:        Intake/Output Summary (Last 24 hours) at 02/16/2022 1117 Last data filed at 02/16/2022 1015 Gross per 24 hour  Intake 4045.36 ml  Output --  Net 4045.36 ml    Filed Weights   02/13/22 1546 02/14/22 0500 02/15/22 0855  Weight: 64.5 kg 63.1 kg 63.3 kg    Examination:  Patient looks comfortable.  On room air. Patient is walking around in the hallway. No focal neurological deficits.    Data Reviewed: I have personally reviewed following labs and imaging studies  CBC: Recent Labs  Lab 02/12/22 0502 02/15/22 0655  WBC 5.7 5.6  HGB 11.7* 11.3*  HCT 34.6* 33.3*  MCV 96.4 97.4  PLT 92* 84*   Basic Metabolic Panel: Recent Labs  Lab 02/10/22 1805 02/12/22 0502 02/13/22 1235 02/15/22 0655  NA 135 140 139 139  K 5.1 4.4 4.2 4.2  CL 100 103 103 104  CO2 '29 30 27 30  '$ GLUCOSE 122* 114* 99 107*  BUN '8 8 9 8  '$ CREATININE 0.78 0.61 0.61 0.63  CALCIUM 9.4 8.8* 9.4 8.7*   GFR: Estimated Creatinine Clearance: 66.2 mL/min (by C-G formula based on SCr of 0.63 mg/dL). Liver Function Tests:  Recent Labs  Lab 02/12/22 0502  AST 14*  ALT 19  ALKPHOS 20*  BILITOT 0.6  PROT 4.5*  ALBUMIN 3.3*   No results for input(s): LIPASE, AMYLASE in the last 168 hours. No results for input(s): AMMONIA in the last 168 hours. Coagulation Profile: No results for input(s): INR, PROTIME in the last 168 hours. Cardiac Enzymes: No results for input(s): CKTOTAL, CKMB, CKMBINDEX, TROPONINI in the last 168 hours. BNP (last 3 results) No results for input(s):  PROBNP in the last 8760 hours. HbA1C: No results for input(s): HGBA1C in the last 72 hours. CBG: No results for input(s): GLUCAP in the last 168 hours. Lipid Profile: No results for input(s): CHOL, HDL, LDLCALC, TRIG, CHOLHDL, LDLDIRECT in the last 72 hours. Thyroid Function Tests: No results for input(s): TSH, T4TOTAL, FREET4, T3FREE, THYROIDAB in the last 72 hours. Anemia Panel: No results for input(s): VITAMINB12, FOLATE, FERRITIN, TIBC, IRON, RETICCTPCT in the last 72 hours.  Sepsis Labs: No results for input(s): PROCALCITON, LATICACIDVEN in the last 168 hours.  Recent Results (from the past 240 hour(s))  Resp Panel by RT-PCR (Flu A&B, Covid) Nasopharyngeal Swab     Status: None   Collection Time: 02/09/22 11:04 AM   Specimen: Nasopharyngeal Swab; Nasopharyngeal(NP) swabs in vial transport medium  Result Value Ref Range Status   SARS Coronavirus 2 by RT PCR NEGATIVE NEGATIVE Final    Comment: (NOTE) SARS-CoV-2 target nucleic acids are NOT DETECTED.  The SARS-CoV-2 RNA is generally detectable in upper respiratory specimens during the acute phase of infection. The lowest concentration of SARS-CoV-2 viral copies this assay can detect is 138 copies/mL. A negative result does not preclude SARS-Cov-2 infection and should not be used as the sole basis for treatment or other patient management decisions. A negative result may occur with  improper specimen collection/handling, submission of specimen other than nasopharyngeal swab, presence of viral mutation(s) within the areas targeted by this assay, and inadequate number of viral copies(<138 copies/mL). A negative result must be combined with clinical observations, patient history, and epidemiological information. The expected result is Negative.  Fact Sheet for Patients:  EntrepreneurPulse.com.au  Fact Sheet for Healthcare Providers:  IncredibleEmployment.be  This test is no t yet approved or  cleared by the [place] FDA and  has been authorized for detection and/or diagnosis of SARS-CoV-2 by FDA under an Emergency Use Authorization (EUA). This EUA will remain  in effect (meaning this test can be used) for the duration of the COVID-19 declaration under Section 564(b)(1) of the Act, 21 U.S.C.section 360bbb-3(b)(1), unless the authorization is terminated  or revoked sooner.       Influenza A by PCR NEGATIVE NEGATIVE Final   Influenza B by PCR NEGATIVE NEGATIVE Final    Comment: (NOTE) The Xpert Xpress SARS-CoV-2/FLU/RSV plus assay is intended as an aid in the diagnosis of influenza from Nasopharyngeal swab specimens and should not be used as a sole basis for treatment. Nasal washings and aspirates are unacceptable for Xpert Xpress SARS-CoV-2/FLU/RSV testing.  Fact Sheet for Patients: EntrepreneurPulse.com.au  Fact Sheet for Healthcare Providers: IncredibleEmployment.be  This test is not yet approved or cleared by the [place] FDA and has been authorized for detection and/or diagnosis of SARS-CoV-2 by FDA under an Emergency Use Authorization (EUA). This EUA will remain in effect (meaning this test can be used) for the duration of the COVID-19 declaration under Section 564(b)(1) of the Act, 21 U.S.C. section 360bbb-3(b)(1), unless the authorization is terminated or revoked.  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 EXAM: CT CHEST, ABDOMEN, AND PELVIS WITH CONTRAST TECHNIQUE: Multidetector CT imaging of the chest, abdomen and pelvis was performed following the standard protocol during bolus administration of intravenous contrast. RADIATION DOSE REDUCTION:  This exam was performed according to the departmental dose-optimization program which includes automated exposure control, adjustment of the mA and/or kV according to patient size and/or use of iterative reconstruction technique. CONTRAST:  8m OMNIPAQUE IOHEXOL 350 MG/ML SOLN COMPARISON:  05/04/2021 FINDINGS: CT CHEST FINDINGS Cardiovascular: No significant vascular findings. Normal heart size. No pericardial effusion. Mediastinum/Nodes: No enlarged mediastinal, hilar, or axillary lymph nodes. Thyroid gland, trachea, and esophagus demonstrate no significant findings. Lungs/Pleura: Lungs are clear. No pleural effusion or pneumothorax. Musculoskeletal: No chest wall mass or suspicious bone lesions identified. CT ABDOMEN PELVIS FINDINGS Hepatobiliary: No focal liver abnormality is seen. No gallstones, gallbladder wall thickening, or biliary dilatation. Pancreas: Unremarkable. No pancreatic ductal dilatation or surrounding inflammatory changes. Spleen: Normal in size without focal abnormality. Adrenals/Urinary Tract: Adrenal glands are unremarkable. Kidneys are normal, without renal calculi, focal lesion, or hydronephrosis. Bladder is unremarkable. Stomach/Bowel: Stomach is within normal limits. Right colectomy. Large amount of stool in the remainder of the colon. No evidence of bowel wall thickening, distention, or inflammatory changes. Stable 3 cm fat attenuating nodular area in the anti mesenteric aspect of the sigmoid colon in the right lower quadrant likely reflecting an area of fat necrosis or old epiploic appendagitis. Vascular/Lymphatic: No significant vascular findings are present. No enlarged abdominal or pelvic lymph nodes. Reproductive: Uterus and bilateral adnexa are unremarkable. Other: No abdominal wall hernia or abnormality. No abdominopelvic ascites. Musculoskeletal: No acute osseous abnormality. No aggressive osseous lesion. IMPRESSION: 1. No acute abnormality in the chest, abdomen or pelvis. 2.  Right colectomy. Large amount of stool in the remainder of the colon. Electronically Signed   By: HKathreen DevoidM.D.   On: 02/14/2022 13:05        Scheduled Meds:  apixaban  5 mg Oral BID   calcium-vitamin D  2 tablet Oral Daily   Chlorhexidine Gluconate Cloth  6 each Topical Daily   gabapentin  100 mg Oral QHS   pantoprazole  40 mg Oral Daily   [START ON 02/18/2022] thiamine injection  100 mg Intravenous Daily   Continuous Infusions:  lactated ringers 50 mL/hr at 02/16/22 1015   sodium chloride 1,000 mL (02/15/22 0645)   thiamine injection 250 mg (02/16/22 1033)     LOS: 7 days    Time spent: 20 minutes   KBarb Merino MD [facility] Pager [phone]

## 2022-02-16 NOTE — Progress Notes (Signed)
Neurology Progress Note ? ?Subjective: ?Patient with continued tingling and numbness with complaints of severe pins and needles overnight. Patient endorses improvement in her painful paraesthesias overnight after initiation of gabapentin.  ?She does report some improvement in her bilateral feet numbness as she feels she may be getting some sensation back. She reports that while ambulating, she experienced bilateral finger tip numbness multiple times overnight that resolved completely with rest.  ? ?Exam: ?Vitals:  ? 02/16/22 0413 02/16/22 0759  ?BP: (!) 91/52 (!) 108/55  ?Pulse: 69 76  ?Resp: 16 17  ?Temp: 98.9 ?F (37.2 ?C) 98.7 ?F (37.1 ?C)  ?SpO2: 95% 98%  ? ?Gen: Sitting at bedside working with PT, in no acute distress ?Resp: non-labored breathing, no respiratory distress on room air ?Abd: soft, non-tender, non-distended ? ?Neuro: ?Mental Status: Awake, alert, and oriented throughout ?Speech is intact without dysarthria or aphasia ?Follows commands without difficulty ?No neglect noted ?Cranial Nerves:PERRL, EOMI, visual fields are full, facial sensation is intact and symmetric to light touch, face is symmetric resting and smiling, hearing is intact to voice, shoulders shrug midline, tongue is midline ?Motor: 5/5 strength throughout without vertical drift.  ?Tone and bulk are normal  ?Sensory: Decreased sensation to light touch reported to bilateral lower extremities. Cool temperature is intact but decreased proximal to the knee. There is constant tingling sensation in bilateral feet with complete numbness reported to the bottom of bilateral feet.  ?DTR: 2+ left patellae, unable to elicit right patellae (pain due to recent total knee replacement). 2+ and symmetric biceps.  ?Gait: Patient is able to ambulate steadily with a walker. She does have slow ambulation with a clear preference for the left leg due to a recent total right total knee replacement.  ? ?Pertinent Labs: ?CBC ?   ?Component Value Date/Time  ? WBC  5.6 02/15/2022 0655  ? RBC 3.42 (L) 02/15/2022 0655  ? HGB 11.3 (L) 02/15/2022 0655  ? HGB 12.6 01/20/2022 0938  ? HGB 12.5 04/24/2009 1519  ? HCT 33.3 (L) 02/15/2022 0655  ? HCT 37.0 04/24/2009 1519  ? PLT 84 (L) 02/15/2022 0655  ? PLT 119 (L) 01/20/2022 9892  ? PLT 96 (L) 04/24/2009 1519  ? MCV 97.4 02/15/2022 0655  ? MCV 91.8 04/24/2009 1519  ? MCH 33.0 02/15/2022 0655  ? MCHC 33.9 02/15/2022 0655  ? RDW 12.8 02/15/2022 0655  ? RDW 13.1 04/24/2009 1519  ? LYMPHSABS 3.4 02/09/2022 1104  ? LYMPHSABS 1.6 04/24/2009 1519  ? MONOABS 0.7 02/09/2022 1104  ? MONOABS 0.3 04/24/2009 1519  ? EOSABS 0.0 02/09/2022 1104  ? EOSABS 0.1 04/24/2009 1519  ? BASOSABS 0.0 02/09/2022 1104  ? BASOSABS 0.0 04/24/2009 1519  ? ?CMP  ?   ?Component Value Date/Time  ? NA 139 02/15/2022 0655  ? K 4.2 02/15/2022 0655  ? CL 104 02/15/2022 0655  ? CO2 30 02/15/2022 0655  ? GLUCOSE 107 (H) 02/15/2022 0655  ? BUN 8 02/15/2022 0655  ? CREATININE 0.63 02/15/2022 0655  ? CREATININE 0.68 01/29/2022 0948  ? CALCIUM 8.7 (L) 02/15/2022 0655  ? PROT 4.5 (L) 02/12/2022 0502  ? ALBUMIN 3.3 (L) 02/12/2022 0502  ? AST 14 (L) 02/12/2022 0502  ? AST 19 01/29/2022 0948  ? ALT 19 02/12/2022 0502  ? ALT 60 (H) 01/29/2022 0948  ? ALKPHOS 20 (L) 02/12/2022 0502  ? BILITOT 0.6 02/12/2022 0502  ? BILITOT 0.5 01/29/2022 0948  ? GFRNONAA >60 02/15/2022 0655  ? GFRNONAA >60 01/29/2022 0948  ? GFRAA >  60 07/12/2019 0333  ? ?Imaging Reviewed: ? ?CT chest, abdomen, and pelvis with contrast 3/5: ?1. No acute abnormality in the chest, abdomen or pelvis. ?2. Right colectomy. Large amount of stool in the remainder of the colon. ? ?Assessment: 61 y.o. female with PMH significant for PMH significant for chronic ITP, HLD, arthritis and s/p recent R Knee replacement, DVTs in L popliteal vein in Dec 2022 and on Eliquis who presents with BL leg numbness that started in Nov. 2022 in her feet and was stable until February and now has numbness up to her waist along with numbness of her  entire face. Her symptoms are concerning for potential small fiber neuropathy. Her neurologic examination is notable for numbness and pain in BL feet and feeling off balance. ?  ?Work-up so far with mildly elevated RNP which I do not think is significant. Her Oligoclonal bands shows 3 identical gamma restriction bands in CSF and serum. This suggests that the antibody production is systemic rather than in the CNS. 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 She does not seem to be report symptoms consistent with neuromyotonia. ?  ?CASPR2 Ab associated disorder can present with peripheral nerve hyperexcitability and neuropathic pain. ([email or web address]) ? ?Recommendations: ?- Continue PLEX with last session 5/5 to be completed on 02/17/2022 ?- Continue gabapentin '100mg'$  qhs for painful paresthesias as she endorsed improvement overnight. If this does not work or is inefficient, can try Cymbalta.  ?- She is s/p 4/5 PLEX session. She has been tolerating this with pretreatment with Tylenol, Benadryl, Zofran and 1L Fluid bolus prior to PLEX. Planned for session 5/5 tomorrow 3/8. ?- We will continue to follow along. ?  ?Anibal Henderson, AGACNP-BC ?[facility] ?[phone] ? ?Attending Neurohospitalist Addendum ?Patient seen and examined with APP/Resident. ?Agree with the history and physical as documented above. ?Agree with the plan as documented, which I helped formulate. ?I have independently reviewed the chart, obtained history, review of systems and examined the patient.I have personally reviewed pertinent head/neck/spine  imaging (CT/MRI). ?Please feel free to call with any questions. ? ?-- ?Amie Portland, MD ?Neurologist ?[facility] ?Pager: [phone] ? ? ?

## 2022-02-16 NOTE — Evaluation (Signed)
Occupational Therapy Evaluation ?Patient Details ?Name: Christine Brady ?MRN: [id number] ?DOB: 04/30/1961 ?Today's Date: 02/16/2022 ? ? ?History of Present Illness Christine Brady is a 61 y.o. 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 PMH:  abdominal pain, osteoarthritis, atypical chest pain, history of bowel obstruction, cholesteatoma right ear, cochlear implant, chronic headaches, chronic ITP, hyperlipidemia, large ovary, GERD, R TKA in 10/22  ? ?Clinical Impression ?  ?Christine Brady was evaluated s/p the above admission list, she reports being mod I PTA including all IADLs. She lives alone in a 1 level home, 2 STE. Upon evaluation pt continue to endorse paresthesias in BLEs, but UE numbness/tingling has resolved. Pt was able to complete functional ambulation and transfers with supervision A and SPC. Up to min A for LB tasks due to baseline R knee limitations. Overall pt is limited by numbness, generalized weakness and R knee weakness. She will benefit from continued OT acutely to address the limitations listed below. Recommend d/c home with OP OT follow up to progress functional rehab back to pt's indep baseline.  ?   ? ?Recommendations for follow up therapy are one component of a multi-disciplinary discharge planning process, led by the attending physician.  Recommendations may be updated based on patient status, additional functional criteria and insurance authorization.  ? ?Follow Up Recommendations ? Outpatient OT  ?  ?Assistance Recommended at Discharge Intermittent Supervision/Assistance  ?Patient can return home with the following A little help with walking and/or transfers;A little help with bathing/dressing/bathroom;Assist for transportation ? ?  ?Functional Status Assessment ? Patient has had a recent decline in their functional status and demonstrates the ability to make significant improvements in function in  a reasonable and predictable amount of time.  ?Equipment Recommendations ? Tub/shower bench (discussed probable out of pocket expense for tub bench.)  ?  ?Recommendations for Other Services   ? ? ?  ?Precautions / Restrictions Precautions ?Precautions: Fall ?Precaution Comments: bilat LE numbness, R TKA october '21 & still rocovering ?Restrictions ?Weight Bearing Restrictions: No  ? ?  ? ?Mobility Bed Mobility ?Overal bed mobility: Modified Independent ?  ?  ?  ?  ?  ?  ?General bed mobility comments: in chiar upon arrival ?  ? ?Transfers ?Overall transfer level: Modified independent ?Equipment used: Straight cane ?  ?  ?  ?  ?  ?  ?  ?General transfer comment: slow, guarded, cautious, no physical assist needed ?  ? ?  ?Balance Overall balance assessment: Needs assistance ?Sitting-balance support: No upper extremity supported, Feet supported ?Sitting balance-Leahy Scale: Normal ?  ?  ?Standing balance support: Single extremity supported, During functional activity ?Standing balance-Leahy Scale: Fair ?Standing balance comment: pt requires use of cane for safe amb ?  ?  ?  ?  ?  ?  ?  ?  ?  ?  ?  ?   ? ?ADL either performed or assessed with clinical judgement  ? ?ADL Overall ADL's : Needs assistance/impaired ?Eating/Feeding: Independent;Sitting ?  ?Grooming: Supervision/safety;Standing ?  ?Upper Body Bathing: Set up;Sitting ?  ?Lower Body Bathing: Minimal assistance;Sit to/from stand ?  ?Upper Body Dressing : Set up;Sitting ?  ?Lower Body Dressing: Min guard;Sit to/from stand ?  ?Toilet Transfer: Supervision/safety;Ambulation [facility]) ?  ?Toileting- Clothing Manipulation and Hygiene: Supervision/safety;Sitting/lateral lean ?Toileting - Clothing Manipulation Details (indicate cue type and reason): pt reports peri area is numb ?  ?  ?  Functional mobility during ADLs: Supervision/safety [facility] ?General ADL Comments: one mild LOB, abel to self correct. slow and deliberate for all movements 2/2 R knee sx & safety  ? ? ? ?Vision  Baseline Vision/History: 0 No visual deficits ?Ability to See in Adequate Light: 0 Adequate ?Patient Visual Report: No change from baseline ?Vision Assessment?: No apparent visual deficits  ?   ?   ?   ? ?Pertinent Vitals/Pain Pain Assessment ?Pain Assessment: No/denies pain ?Pain Intervention(s): Monitored during session  ? ? ? ?Hand Dominance Right ?  ?Extremity/Trunk Assessment Upper Extremity Assessment ?Upper Extremity Assessment: Generalized weakness (numbness/tingling in hands with mobility prior; did not occur this sessoin) ?  ?Lower Extremity Assessment ?Lower Extremity Assessment: Defer to PT evaluation ?  ?Cervical / Trunk Assessment ?Cervical / Trunk Assessment: Normal ?  ?Communication Communication ?Communication: No difficulties ?  ?Cognition Arousal/Alertness: Awake/alert ?Behavior During Therapy: [facility] for tasks assessed/performed ?Overall Cognitive Status: Within Functional Limits for tasks assessed ?  ?  ?  ?  ?  ?  ?  ?General Comments: good insight to deficits, reports driving with bilat LE numbness ?  ?  ?General Comments  VSS on RA ? ?  ?   ?   ? ? ?Home Living Family/patient expects to be discharged to:: Private residence ?Living Arrangements: Alone ?Available Help at Discharge: Friend(s);Available PRN/intermittently ?Type of Home: House ?Home Access: Stairs to enter ?Entrance Stairs-Number of Steps: 2 ?Entrance Stairs-Rails: None ?Home Layout: One level ?  ?  ?Bathroom Shower/Tub: Tub/shower unit ?  ?Bathroom Toilet: Standard ?Bathroom Accessibility: Yes ?  ?Home Equipment: Conservation officer, nature (2 wheels) ?  ?  ?  ? ?  ?Prior Functioning/Environment Prior Level of Function : Independent/Modified Independent;Needs assist;Driving ?  ?  ?  ?  ?  ?  ?Mobility Comments: pt has had to use a cane recently because of the bilat LE numbness, pt with difficulty stepping over tub to take a shower ?ADLs Comments: pt reports difficulty driving and getting in/out of tub, has been working okay with use of cane ?   ? ?  ?  ?OT Problem List: Decreased strength;Decreased range of motion;Decreased activity tolerance;Impaired balance (sitting and/or standing);Decreased safety awareness;Decreased knowledge of precautions ?  ?   ?OT Treatment/Interventions: Self-care/ADL training;Therapeutic exercise;DME and/or AE instruction;Therapeutic activities;Patient/family education;Balance training  ?  ?OT Goals(Current goals can be found in the care plan section) Acute Rehab OT Goals ?Patient Stated Goal: home tomorrow ?OT Goal Formulation: With patient ?Time For Goal Achievement: 03/02/22 ?Potential to Achieve Goals: Good ?ADL Goals ?Pt Will Perform Tub/Shower Transfer: Tub transfer;tub bench;ambulating ?Additional ADL Goal #1: Pt will complete all BADLs with mod I  ?OT Frequency: Min 2X/week ?  ? ?   ?AM-PAC OT "6 Clicks" Daily Activity     ?Outcome Measure Help from another person eating meals?: None ?Help from another person taking care of personal grooming?: A Little ?Help from another person toileting, which includes using toliet, bedpan, or urinal?: A Little ?Help from another person bathing (including washing, rinsing, drying)?: A Little ?Help from another person to put on and taking off regular upper body clothing?: None ?Help from another person to put on and taking off regular lower body clothing?: A Little ?6 Click Score: 20 ?  ?End of Session Equipment Utilized During Treatment:  [facility]) ?Nurse Communication: Mobility status ? ?Activity Tolerance: Patient tolerated treatment well ?Patient left: in chair;with call bell/phone within reach ? ?OT Visit Diagnosis: Unsteadiness on feet (R26.81);Other abnormalities of gait and mobility (  R26.89);Muscle weakness (generalized) (M62.81)  ?              ?Time: 9038-3338 ?OT Time Calculation (min): 32 min ?Charges:  OT General Charges ?$OT Visit: 1 Visit ?OT Evaluation ?$OT Eval Low Complexity: 1 Low ?OT Treatments ?$Self Care/Home Management : 8-22 mins ? ? ?Janesia Joswick A Pamela Intrieri ?02/16/2022,  4:52 PM ?

## 2022-02-17 ENCOUNTER — Inpatient Hospital Stay: Payer: 59 | Admitting: Oncology

## 2022-02-17 DIAGNOSIS — K2100 Gastro-esophageal reflux disease with esophagitis, without bleeding: Secondary | ICD-10-CM

## 2022-02-17 DIAGNOSIS — G610 Guillain-Barre syndrome: Principal | ICD-10-CM

## 2022-02-17 DIAGNOSIS — G6181 Chronic inflammatory demyelinating polyneuritis: Secondary | ICD-10-CM

## 2022-02-17 LAB — BASIC METABOLIC PANEL
Anion gap: 7 (ref 5–15)
BUN: 8 mg/dL (ref 6–20)
CO2: 26 mmol/L (ref 22–32)
Calcium: 8.6 mg/dL — ABNORMAL LOW (ref 8.9–10.3)
Chloride: 104 mmol/L (ref 98–111)
Creatinine, Ser: 0.76 mg/dL (ref 0.44–1.00)
GFR, Estimated: >60 mL/min (ref >60)
Glucose, Bld: 104 mg/dL — ABNORMAL HIGH (ref 70–99)
Potassium: 4.1 mmol/L (ref 3.5–5.1)
Sodium: 137 mmol/L (ref 135–145)

## 2022-02-17 LAB — CBC
HCT: 32.3 % — ABNORMAL LOW (ref 36.0–46.0)
Hemoglobin: 10.7 g/dL — ABNORMAL LOW (ref 12.0–15.0)
MCH: 32.1 pg (ref 26.0–34.0)
MCHC: 33.1 g/dL (ref 30.0–36.0)
MCV: 97 fL (ref 80.0–100.0)
Platelets: 87 10*3/uL — ABNORMAL LOW (ref 150–400)
RBC: 3.33 MIL/uL — ABNORMAL LOW (ref 3.87–5.11)
RDW: 12.9 % (ref 11.5–15.5)
WBC: 6.6 10*3/uL (ref 4.0–10.5)
nRBC: 0 % (ref 0.0–0.2)

## 2022-02-17 LAB — GLUTAMIC ACID DECARBOXYLASE AUTO ABS: Glutamic Acid Decarb Ab: <5 U/mL (ref 0.0–5.0)

## 2022-02-17 LAB — VITAMIN E
Vitamin E (Alpha Tocopherol): 10 mg/L (ref 7.0–25.1)
Vitamin E(Gamma Tocopherol): 1 mg/L (ref 0.5–5.5)

## 2022-02-17 MED ORDER — DIPHENHYDRAMINE HCL 25 MG PO CAPS: 25.0000 mg | ORAL_CAPSULE | Freq: Four times a day (QID) | ORAL | Status: DC | PRN

## 2022-02-17 MED ORDER — HEPARIN SODIUM (PORCINE) 1000 UNIT/ML IJ SOLN
1000.0000 [IU] | Freq: Once | INTRAMUSCULAR | Status: DC
  Filled 2022-02-17: qty 1

## 2022-02-17 MED ORDER — CALCIUM CARBONATE ANTACID 500 MG PO CHEW
2.0000 | CHEWABLE_TABLET | ORAL | Status: AC
  Administered 2022-02-17 (×2): 400 mg via ORAL
  Filled 2022-02-17: qty 2

## 2022-02-17 MED ORDER — CALCIUM GLUCONATE-NACL 2-0.675 GM/100ML-% IV SOLN
2.0000 g | Freq: Once | INTRAVENOUS | Status: AC
  Administered 2022-02-17: 2000 mg via INTRAVENOUS
  Filled 2022-02-17: qty 100

## 2022-02-17 MED ORDER — ACETAMINOPHEN 325 MG PO TABS
650.0000 mg | ORAL_TABLET | Freq: Four times a day (QID) | ORAL | Status: DC | PRN
Start: 2022-02-17 — End: 2022-02-19
  Administered 2022-02-17 – 2022-02-19 (×2): 650 mg via ORAL
  Filled 2022-02-17 (×2): qty 2

## 2022-02-17 MED ORDER — ACETAMINOPHEN 325 MG PO TABS
650.0000 mg | ORAL_TABLET | ORAL | Status: DC | PRN
  Administered 2022-02-17: 650 mg via ORAL
  Filled 2022-02-17: qty 2

## 2022-02-17 MED ORDER — ACD FORMULA A 0.73-2.45-2.2 GM/100ML VI SOLN
1000.0000 mL | Status: DC
  Administered 2022-02-17: 1000 mL
  Filled 2022-02-17: qty 1000

## 2022-02-17 MED ORDER — SODIUM CHLORIDE 0.9 % IV SOLN
INTRAVENOUS | Status: AC
  Filled 2022-02-17 (×3): qty 200

## 2022-02-17 NOTE — Consult Note (Signed)
? ?  [facility] CM Inpatient Consult ? ? ?02/17/2022 ? ?Christine Brady ?1961-08-30 ?[id number] ? ? ?Follow up:  [place] plan ? ?Came by to see patient for any post hospital follow up needs. However, patient was sound asleep.  Let an appointment reminder card on bedside table.  Will follow for any needs. ? ?For questions, ? ?Natividad Brood, RN BSN CCM ?[facility] Liaison ? [phone] business mobile phone ?Toll free office [phone]  ?Fax number: [phone] ?[email or web address] ? ? ?

## 2022-02-17 NOTE — Assessment & Plan Note (Addendum)
-   CT head essentially normal, CT venogram of the head without any evidence of dural sinus stenosis. ?-TDC placed, patient underwent plasma exchange, completed #5/5 session on 3/8.  TDC removed. ?-Completed high-dose IV thiamine.  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  Patient was followed by neurology. ?-Headache improved with Toradol, Reglan and Benadryl.   ?-She will follow-up outpatient with her neurologist, Dr. Jaynee Eagles. ?

## 2022-02-17 NOTE — Care Management (Signed)
To Whom it may concern, ?Patient is cleared to resume outpatient physical and occupational therapy services as established prior to admission in the hospital. ?Signed ?Carles Collet RN CM ?Co-Signed  ?Estill Cotta MD ?

## 2022-02-17 NOTE — Progress Notes (Signed)
? ? ? [facility] ?                                                                            ? ? ?Christine Brady, is a 61 y.o. female, DOB - 04-Jan-1961, IOM:[id number] ?Admit date - 02/09/2022    ?Outpatient Primary MD for the patient is Christine Hipps, MD ? ?LOS - 8  days ? ? ? ?Brief summary  ? 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  Hemodynamically stable in the ER.  ? CT lumbar spine 2 weeks ago with abnormal lumbar nerve root thickening and nodularity, patient could not have MRI because of cochlear implant.  Admitted with neurology consultation.  Currently receiving plasma exchange. ? ? ?Assessment & Plan  ? ? ?Assessment and Plan: ?* Acute inflammatory demyelinating polyneuropathy (HCC) ?- CT head essentially normal, CT venogram of the head without any evidence of dural sinus stenosis. 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 ?-Symptoms improving ? ?Deep venous thrombosis of lower extremity ([place]) ?- Continue eliquis ? ?Idiopathic thrombocytopenia (HCC) ?- Platelet count stable, continue to monitor ? ? ? ?Code Status:  full  ?DVT Prophylaxis:   ?apixaban (ELIQUIS) tablet 5 mg  ? ?Level of Care: Level of care: Med-Surg ?Family Communication:  ?Disposition Plan:     Remains inpatient appropriate: Completing plasma exchange today, needs TDC removed prior to discharge ?Will need outpatient PT OT ? ?Procedures:  ?Plasma exchange ?Consultants:   ?Neurology ? ?Antimicrobials:  ?None   ? ? ?Medications ? ? apixaban  5 mg Oral BID  ? calcium-vitamin D  2 tablet Oral Daily  ? Chlorhexidine Gluconate Cloth  6 each Topical Daily  ? gabapentin  100 mg Oral QHS  ? pantoprazole  40 mg Oral Daily  ? [START ON 02/18/2022] thiamine injection  100 mg Intravenous Daily  ? ? ? ?Subjective:  ? ?Christine Brady was seen and examined today.  States feels very weak after the plasma exchange although symptoms are slowly improving.  Hoping to go home soon.  Patient denies  chest pain, shortness of breath, abdominal pain, N/V/D/C.  No fevers ?Objective:  ? ?Vitals:  ? 02/17/22 1026 02/17/22 1041 02/17/22 1053 02/17/22 1114  ?BP: (!) 91/59   (!) 91/54  ?Pulse: 77 75 71 77  ?Resp: (!) '21 14 20 17  '$ ?Temp: 98.6 ?F (37 ?C)  98 ?F (36.7 ?C) 98.4 ?F (36.9 ?C)  ?TempSrc: Oral  Oral Oral  ?SpO2: 97% 97% 98% 98%  ?Weight:      ?Height:      ? ?No intake or output data in the 24 hours ending 02/17/22 1427 ?Filed Weights  ? 02/13/22 1546 02/14/22 0500 02/15/22 0855  ?Weight: 64.5 kg 63.1 kg 63.3 kg  ? ? ? ?  Exam ?General: Alert and oriented x 3, NAD ?Cardiovascular: S1 S2 auscultated, no murmurs, RRR ?Respiratory: Clear to auscultation bilaterally, no wheezing ?Gastrointestinal: Soft, nontender, nondistended, + bowel sounds ?Ext: no pedal edema bilaterally ?Neuro: strength 5/5 in upper and lower extremities ?Psych: Normal affect and demeanor, alert and oriented x3  ? ? ?Data Reviewed:  I have personally reviewed following labs  ? ? ?CBC ?Lab Results  ?Component Value Date  ? WBC 6.6 02/17/2022  ? RBC 3.33 (L) 02/17/2022  ? HGB 10.7 (L) 02/17/2022  ? HCT 32.3 (L) 02/17/2022  ? MCV 97.0 02/17/2022  ? MCH 32.1 02/17/2022  ? PLT 87 (L) 02/17/2022  ? MCHC 33.1 02/17/2022  ? RDW 12.9 02/17/2022  ? LYMPHSABS 3.4 02/09/2022  ? MONOABS 0.7 02/09/2022  ? EOSABS 0.0 02/09/2022  ? BASOSABS 0.0 02/09/2022  ? ? ? ?Last metabolic panel ?Lab Results  ?Component Value Date  ? NA 137 02/17/2022  ? K 4.1 02/17/2022  ? CL 104 02/17/2022  ? CO2 26  02/17/2022  ? BUN 8 02/17/2022  ? CREATININE 0.76 02/17/2022  ? GLUCOSE 104 (H) 02/17/2022  ? GFRNONAA >60 02/17/2022  ? GFRAA >60 07/12/2019  ? CALCIUM 8.6 (L) 02/17/2022  ? PROT 4.5 (L) 02/12/2022  ? ALBUMIN 3.3 (L) 02/12/2022  ? LABGLOB 2.4 02/09/2022  ? AGRATIO 1.4 02/09/2022  ? BILITOT 0.6 02/12/2022  ? ALKPHOS 20 (L) 02/12/2022  ? AST 14 (L) 02/12/2022  ? ALT 19 02/12/2022  ? ANIONGAP 7 02/17/2022  ? ? ? ? ? ? ? ?Christine Brady M.D. ?[facility] ?02/17/2022, 2:27 PM ? ?Available via Epic secure chat 7am-7pm ?After 7 pm, please refer to night coverage provider listed on amion. ? ?  ?

## 2022-02-17 NOTE — Progress Notes (Signed)
Physical Therapy Treatment ?Patient Details ?Name: Christine Brady ?MRN: [id number] ?DOB: 15-Mar-1961 ?Today's Date: 02/17/2022 ? ? ?History of Present Illness Christine Brady is a 61 y.o. 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 PMH:  abdominal pain, osteoarthritis, atypical chest pain, history of bowel obstruction, cholesteatoma right ear, cochlear implant, chronic headaches, chronic ITP, hyperlipidemia, large ovary, GERD, R TKA in 10/22 ? ?  ?PT Comments  ? ? Session focused on R LE strengthening due to weakness from recent R TKA. Pt appreciative of exercises and is eager to get back to outpt and begin gaining strength back. Pt educated on using RW s/p PT sessions as pt's R LE becomes very fatigued and shaky requiring more support during ambulation post R LE exercises. Pt agreed. Acute PT to cont to follow. ?   ?Recommendations for follow up therapy are one component of a multi-disciplinary discharge planning process, led by the attending physician.  Recommendations may be updated based on patient status, additional functional criteria and insurance authorization. ? ?Follow Up Recommendations ? Outpatient PT ?  ?  ?Assistance Recommended at Discharge PRN  ?Patient can return home with the following Assist for transportation;Assistance with cooking/housework ?  ?Equipment Recommendations ? Cane (tub bench)  ?  ?Recommendations for Other Services   ? ? ?  ?Precautions / Restrictions Precautions ?Precautions: Fall ?Precaution Comments: bilat LE numbness, R TKA october '21 & still rocovering ?Restrictions ?Weight Bearing Restrictions: No  ?  ? ?Mobility ? Bed Mobility ?Overal bed mobility: Modified Independent ?  ?  ?  ?  ?  ?  ?General bed mobility comments: HOB elevated ?  ? ?Transfers ?Overall transfer level: Modified independent ?Equipment used: Straight cane ?  ?  ?  ?  ?  ?  ?  ?General transfer comment: slow,  guarded, cautious, no physical assist needed ?  ? ?Ambulation/Gait ?Ambulation/Gait assistance: Min guard ?Gait Distance (Feet): 150 Feet (x2) ?Assistive device: Straight cane, 1 person hand held assist ?Gait Pattern/deviations: Step-through pattern, Decreased stride length ?Gait velocity: dec ?Gait velocity interpretation: 1.31 - 2.62 ft/sec, indicative of limited community ambulator ?  ?General Gait Details: pt amb 150' to PT gym with SPC in L UE, pt requiring HHA on the R s/p R LE exercises in the gym and R LE fatigue/shakiness ? ? ?Stairs ?  ?Stairs assistance: Min guard ?Stair Management: One rail Right, With cane ?  ?General stair comments: slow and guarded, very dependent on cane and rail due to bilat LE weakness, R > L ? ? ?Wheelchair Mobility ?  ? ?Modified Rankin (Stroke Patients Only) ?  ? ? ?  ?Balance Overall balance assessment: Needs assistance ?Sitting-balance support: No upper extremity supported, Feet supported ?Sitting balance-Leahy Scale: Normal ?  ?  ?Standing balance support: Single extremity supported, During functional activity ?Standing balance-Leahy Scale: Fair ?Standing balance comment: pt requires use of cane for safe amb ?  ?  ?  ?  ?  ?  ?  ?  ?  ?  ?  ?  ? ?  ?Cognition Arousal/Alertness: Awake/alert ?Behavior During Therapy: [facility] for tasks assessed/performed ?Overall Cognitive Status: Within Functional Limits for tasks assessed ?  ?  ?  ?  ?  ?  ?  ?  ?  ?  ?  ?  ?  ?  ?  ?  ?General Comments: good insight to deficits ?  ?  ? ?  ?  Exercises Other Exercises ?Other Exercises: R LE step ups x 10 reps with concentration on VMO activation and slow/controlled descent ?Other Exercises: R LE lateral step ups on 4 inch step due to inability to complete on 8 in step x 10 reps ?Other Exercises: seated LAQ and hamstring curls with orange theraband ?Other Exercises: standing R LE ABD with orange theraband ? ?  ?General Comments General comments (skin integrity, edema, etc.): VSS on RA, educated pt on  how to measure height of cane optimally at wrist ?  ?  ? ?Pertinent Vitals/Pain Pain Assessment ?Pain Assessment: 0-10 ?Pain Score: 3  ?Pain Location: headache ?Pain Descriptors / Indicators: Headache ?Pain Intervention(s): Monitored during session  ? ? ?Home Living   ?  ?  ?  ?  ?  ?  ?  ?  ?  ?   ?  ?Prior Function    ?  ?  ?   ? ?PT Goals (current goals can now be found in the care plan section) Acute Rehab PT Goals ?Patient Stated Goal: home ?PT Goal Formulation: With patient ?Time For Goal Achievement: 03/02/22 ?Potential to Achieve Goals: Good ?Progress towards PT goals: Progressing toward goals ? ?  ?Frequency ? ? ? Min 4X/week ? ? ? ?  ?PT Plan Current plan remains appropriate  ? ? ?Co-evaluation   ?  ?  ?  ?  ? ?  ?AM-PAC PT "6 Clicks" Mobility   ?Outcome Measure ? Help needed turning from your back to your side while in a flat bed without using bedrails?: None ?Help needed moving from lying on your back to sitting on the side of a flat bed without using bedrails?: None ?Help needed moving to and from a bed to a chair (including a wheelchair)?: None ?Help needed standing up from a chair using your arms (e.g., wheelchair or bedside chair)?: A Little ?Help needed to walk in hospital room?: A Little ?Help needed climbing 3-5 steps with a railing? : A Little ?6 Click Score: 21 ? ?  ?End of Session Equipment Utilized During Treatment: Gait belt ?Activity Tolerance: Patient tolerated treatment well ?Patient left: Other (comment) (on toliet with RN present) ?Nurse Communication: Mobility status ?PT Visit Diagnosis: Unsteadiness on feet (R26.81);Other abnormalities of gait and mobility (R26.89);Repeated falls (R29.6);Muscle weakness (generalized) (M62.81);Difficulty in walking, not elsewhere classified (R26.2) ?  ? ? ?Time: 3664-4034 ?PT Time Calculation (min) (ACUTE ONLY): 45 min ? ?Charges:  $Gait Training: 8-22 mins ?$Therapeutic Exercise: 23-37 mins          ?          ? ?Kittie Plater, PT, DPT ?Acute  Rehabilitation Services ?Pager #: [phone] ?Office #: [phone] ? ? ? ?Ellyana Crigler M Georga Stys ?02/17/2022, 11:34 AM ? ? ? ?

## 2022-02-17 NOTE — Progress Notes (Signed)
HOSPITAL MEDICINE OVERNIGHT EVENT NOTE   ? ?Notified by nursing that patient is exhibiting a fever of 101.6 ?F. ? ?Upon further questioning, patient is noted to be complaining of a dry nonproductive cough for the past 2 days.  Patient is not complaining of any shortness of breath, dysuria, abdominal pain, low back pain or diarrhea. ? ?Considering patient has been hospitalized for approximately 8 days we will fully assess for any evidence of impending infection including obtaining urinalysis, urine culture, blood cultures, chest x-ray and CBCD. ? ?We will additionally order as needed Tylenol to manage patient's fever.  Chart reviewed and I note that patient was on chronic Tylenol therapy at approximately 1300 mg daily which was attributed to patient's slight elevation in LFTs in February prompting the discontinuation of the drug.  I do not think it was ever truly clear that acetaminophen at such a low dose caused liver injury but regardless I have ordered a repeat chemistry including LFTs which can be trended if necessary to evaluate for recurrence of new transaminitis. ? ?Vernelle Emerald  MD ?[facility]  ? ? ? ? ? ? ? ? ? ? ?

## 2022-02-17 NOTE — Assessment & Plan Note (Addendum)
-   Platelet counts improving, outpatient follow-up with neurology ?

## 2022-02-17 NOTE — Progress Notes (Signed)
Occupational Therapy Treatment ?Patient Details ?Name: Christine Brady ?MRN: [id number] ?DOB: April 02, 1961 ?Today's Date: 02/17/2022 ? ? ?History of present illness Christine Brady is a 61 y.o. 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 PMH:  abdominal pain, osteoarthritis, atypical chest pain, history of bowel obstruction, cholesteatoma right ear, cochlear implant, chronic headaches, chronic ITP, hyperlipidemia, large ovary, GERD, R TKA in 10/22 ?  ?OT comments ? Patient making good progress with OT session. Patient able to get to EOB and ambulated to bathroom for toilet transfer and hygiene. Patient ambulated to rehab gym and attempted tub transfer with min guard to tub seat.  Demonstration provided on tub bench use and patient was able to return demonstration with supervision. Patient states she felt safer with tub bench. Acute OT to continue to follow.   ? ?Recommendations for follow up therapy are one component of a multi-disciplinary discharge planning process, led by the attending physician.  Recommendations may be updated based on patient status, additional functional criteria and insurance authorization. ?   ?Follow Up Recommendations ? Outpatient OT  ?  ?Assistance Recommended at Discharge Intermittent Supervision/Assistance  ?Patient can return home with the following ? A little help with walking and/or transfers;A little help with bathing/dressing/bathroom;Assist for transportation ?  ?Equipment Recommendations ? Tub/shower bench  ?  ?Recommendations for Other Services   ? ?  ?Precautions / Restrictions Precautions ?Precautions: Fall ?Precaution Comments: bilat LE numbness, R TKA october '21 & still rocovering ?Restrictions ?Weight Bearing Restrictions: No  ? ? ?  ? ?Mobility Bed Mobility ?Overal bed mobility: Modified Independent ?  ?  ?  ?  ?  ?  ?General bed mobility comments: HOB elevated ?   ? ?Transfers ?Overall transfer level: Modified independent ?Equipment used: None (IV pole) ?  ?  ?  ?  ?  ?  ?  ?General transfer comment: used IV pole for balance with mobility ?  ?  ?Balance Overall balance assessment: Needs assistance ?Sitting-balance support: No upper extremity supported, Feet supported ?Sitting balance-Leahy Scale: Normal ?  ?  ?Standing balance support: Single extremity supported, During functional activity ?Standing balance-Leahy Scale: Fair ?Standing balance comment: required one extremity assistance for balance ?  ?  ?  ?  ?  ?  ?  ?  ?  ?  ?  ?   ? ?ADL either performed or assessed with clinical judgement  ? ?ADL Overall ADL's : Needs assistance/impaired ?  ?  ?Grooming: Supervision/safety;Standing ?Grooming Details (indicate cue type and reason): performed hand hygiene following toileting ?  ?  ?  ?  ?  ?  ?  ?  ?Toilet Transfer: Supervision/safety;Ambulation (used IV pole) ?  ?Toileting- Clothing Manipulation and Hygiene: Modified independent;Sitting/lateral lean ?  ?Tub/ Shower Transfer: Tub transfer;Supervision/safety;Min guard;Shower seat;Tub bench ?Tub/Shower Transfer Details (indicate cue type and reason): performed tub transfer with tub seat and min guard.  supervision with tub bench following demonstration ?  ?General ADL Comments: demonstrated good safety wtih tub transfer ?  ? ?Extremity/Trunk Assessment   ?  ?  ?  ?  ?  ? ?Vision   ?  ?  ?Perception   ?  ?Praxis   ?  ? ?Cognition Arousal/Alertness: Awake/alert ?Behavior During Therapy: [facility] for tasks assessed/performed ?Overall Cognitive Status: Within Functional Limits for tasks assessed ?  ?  ?  ?  ?  ?  ?  ?  ?  ?  ?  ?  ?  ?  ?  ?  ?  General Comments: oriented x4 ?  ?  ?   ?Exercises   ? ?  ?Shoulder Instructions   ? ? ?  ?General Comments    ? ? ?Pertinent Vitals/ Pain       Pain Assessment ?Pain Assessment: 0-10 ?Pain Score: 3  ?Pain Location: headache ?Pain Descriptors / Indicators: Headache ?Pain Intervention(s):  Monitored during session ? ?Home Living   ?  ?  ?  ?  ?  ?  ?  ?  ?  ?  ?  ?  ?  ?  ?  ?  ?  ?  ? ?  ?Prior Functioning/Environment    ?  ?  ?  ?   ? ?Frequency ? Min 2X/week  ? ? ? ? ?  ?Progress Toward Goals ? ?OT Goals(current goals can now be found in the care plan section) ? Progress towards OT goals: Progressing toward goals ? ?Acute Rehab OT Goals ?Patient Stated Goal: go home ?OT Goal Formulation: With patient ?Time For Goal Achievement: 03/02/22 ?Potential to Achieve Goals: Good ?ADL Goals ?Pt Will Perform Tub/Shower Transfer: Tub transfer;tub bench;ambulating ?Additional ADL Goal #1: Pt will complete all BADLs with mod I  ?Plan Discharge plan remains appropriate   ? ?Co-evaluation ? ? ?   ?  ?  ?  ?  ? ?  ?AM-PAC OT "6 Clicks" Daily Activity     ?Outcome Measure ? ? Help from another person eating meals?: None ?Help from another person taking care of personal grooming?: A Little ?Help from another person toileting, which includes using toliet, bedpan, or urinal?: A Little ?Help from another person bathing (including washing, rinsing, drying)?: A Little ?Help from another person to put on and taking off regular upper body clothing?: None ?Help from another person to put on and taking off regular lower body clothing?: A Little ?6 Click Score: 20 ? ?  ?End of Session   ? ?OT Visit Diagnosis: Unsteadiness on feet (R26.81);Other abnormalities of gait and mobility (R26.89);Muscle weakness (generalized) (M62.81) ?  ?Activity Tolerance Patient tolerated treatment well ?  ?Patient Left in bed;with call bell/phone within reach ?  ?Nurse Communication Mobility status ?  ? ?   ? ?Time: 0277-4128 ?OT Time Calculation (min): 15 min ? ?Charges: OT General Charges ?$OT Visit: 1 Visit ?OT Treatments ?$Self Care/Home Management : 8-22 mins ? ?Lodema Hong, OTA ?Acute Rehabilitation Services  ?Pager [phone] ?Office [phone] ? ? ?Trixie Dredge ?02/17/2022, 3:24 PM ?

## 2022-02-17 NOTE — Hospital Course (Addendum)
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  Hemodynamically stable in the ER.  ? CT lumbar spine 2 weeks ago with abnormal lumbar nerve root thickening and nodularity, patient could not have MRI because of cochlear implant.  Admitted with neurology consultation.  Currently receiving plasma exchange. ? ?Completed plasma exchange 5/5 on 02/17/2022 ?

## 2022-02-17 NOTE — TOC Transition Note (Signed)
Transition of Care (TOC) - CM/SW Discharge Note ? ? ?Patient Details  ?Name: Christine Brady ?MRN: [id number] ?Date of Birth: [birth year] ? ?Transition of Care (TOC) CM/SW Contact:  ?Carles Collet, RN ?Phone Number: ?02/17/2022, 2:03 PM ? ? ?Clinical Narrative:    ?Spoke to patient to discuss DC plan.  ?Patient will have cane delivered to room today. ?She would like to resume outpatient therapy with [facility] on [address] ?[phone] fax [phone]. ?Office staff requested letter from MD to clear for services. Letter requested of attending  and faxed today. ?Office will call patient to schedule. ?No other TOC needs identified at this time. ? ? ? ?Final next level of care: Home/Self Care ?Barriers to Discharge: No Barriers Identified ? ? ?Patient Goals and CMS Choice ?Patient states their goals for this hospitalization and ongoing recovery are:: to go home ?CMS Medicare.gov Compare Post Acute Care list provided to:: Patient ?Choice offered to / list presented to : Patient ? ?Discharge Placement ?  ?           ?  ?  ?  ?  ? ?Discharge Plan and Services ?  ?  ?           ?DME Arranged: Cane ?DME Agency: [employer] ?Date DME Agency Contacted: 02/17/22 ?Time DME Agency Contacted: [phone] ?Representative spoke with at DME Agency: Freda Munro ?  ?  ?  ?  ?  ? ?Social Determinants of Health (SDOH) Interventions ?  ? ? ?Readmission Risk Interventions ?No flowsheet data found. ? ? ? ? ?

## 2022-02-17 NOTE — Assessment & Plan Note (Signed)
Continue eliquis  ?

## 2022-02-17 NOTE — Plan of Care (Signed)

## 2022-02-18 ENCOUNTER — Inpatient Hospital Stay (HOSPITAL_COMMUNITY): Payer: 59

## 2022-02-18 DIAGNOSIS — E7800 Pure hypercholesterolemia, unspecified: Secondary | ICD-10-CM

## 2022-02-18 DIAGNOSIS — D693 Immune thrombocytopenic purpura: Secondary | ICD-10-CM

## 2022-02-18 LAB — CBC WITH DIFFERENTIAL/PLATELET
Abs Immature Granulocytes: 0.02 10*3/uL (ref 0.00–0.07)
Basophils Absolute: 0 10*3/uL (ref 0.0–0.1)
Basophils Relative: 1 %
Eosinophils Absolute: 0.1 10*3/uL (ref 0.0–0.5)
Eosinophils Relative: 2 %
HCT: 30.8 % — ABNORMAL LOW (ref 36.0–46.0)
Hemoglobin: 10.3 g/dL — ABNORMAL LOW (ref 12.0–15.0)
Immature Granulocytes: 0 %
Lymphocytes Relative: 33 %
Lymphs Abs: 1.9 10*3/uL (ref 0.7–4.0)
MCH: 32.9 pg (ref 26.0–34.0)
MCHC: 33.4 g/dL (ref 30.0–36.0)
MCV: 98.4 fL (ref 80.0–100.0)
Monocytes Absolute: 0.5 10*3/uL (ref 0.1–1.0)
Monocytes Relative: 8 %
Neutro Abs: 3.2 10*3/uL (ref 1.7–7.7)
Neutrophils Relative %: 56 %
Platelets: 77 10*3/uL — ABNORMAL LOW (ref 150–400)
RBC: 3.13 MIL/uL — ABNORMAL LOW (ref 3.87–5.11)
RDW: 13.1 % (ref 11.5–15.5)
WBC: 5.7 10*3/uL (ref 4.0–10.5)
nRBC: 0 % (ref 0.0–0.2)

## 2022-02-18 LAB — URINALYSIS, COMPLETE (UACMP) WITH MICROSCOPIC
Bilirubin Urine: NEGATIVE
Glucose, UA: NEGATIVE mg/dL
Hgb urine dipstick: NEGATIVE
Ketones, ur: NEGATIVE mg/dL
Leukocytes,Ua: NEGATIVE
Nitrite: POSITIVE — AB
Protein, ur: NEGATIVE mg/dL
Specific Gravity, Urine: 1.008 (ref 1.005–1.030)
pH: 5 (ref 5.0–8.0)

## 2022-02-18 LAB — COMPREHENSIVE METABOLIC PANEL
ALT: 8 U/L (ref 0–44)
AST: 15 U/L (ref 15–41)
Albumin: 3.6 g/dL (ref 3.5–5.0)
Alkaline Phosphatase: 11 U/L — ABNORMAL LOW (ref 38–126)
Anion gap: 7 (ref 5–15)
BUN: <5 mg/dL — ABNORMAL LOW (ref 6–20)
CO2: 21 mmol/L — ABNORMAL LOW (ref 22–32)
Calcium: 8.5 mg/dL — ABNORMAL LOW (ref 8.9–10.3)
Chloride: 108 mmol/L (ref 98–111)
Creatinine, Ser: 0.73 mg/dL (ref 0.44–1.00)
GFR, Estimated: >60 mL/min (ref >60)
Glucose, Bld: 106 mg/dL — ABNORMAL HIGH (ref 70–99)
Potassium: 4.4 mmol/L (ref 3.5–5.1)
Sodium: 136 mmol/L (ref 135–145)
Total Bilirubin: 0.8 mg/dL (ref 0.3–1.2)
Total Protein: 4.5 g/dL — ABNORMAL LOW (ref 6.5–8.1)

## 2022-02-18 MED ORDER — METOCLOPRAMIDE HCL 5 MG/ML IJ SOLN
10.0000 mg | Freq: Once | INTRAMUSCULAR | Status: AC
  Administered 2022-02-18: 13:00:00 10 mg via INTRAVENOUS
  Filled 2022-02-18: qty 2

## 2022-02-18 MED ORDER — TRAMADOL HCL 50 MG PO TABS
100.0000 mg | ORAL_TABLET | Freq: Once | ORAL | Status: AC
  Administered 2022-02-18: 10:00:00 100 mg via ORAL
  Filled 2022-02-18: qty 2

## 2022-02-18 MED ORDER — KETOROLAC TROMETHAMINE 15 MG/ML IJ SOLN
15.0000 mg | Freq: Once | INTRAMUSCULAR | Status: AC
  Administered 2022-02-18: 13:00:00 15 mg via INTRAVENOUS
  Filled 2022-02-18: qty 1

## 2022-02-18 MED ORDER — DIPHENHYDRAMINE HCL 50 MG/ML IJ SOLN
25.0000 mg | Freq: Once | INTRAMUSCULAR | Status: AC
  Administered 2022-02-18: 13:00:00 25 mg via INTRAVENOUS
  Filled 2022-02-18: qty 1

## 2022-02-18 NOTE — Assessment & Plan Note (Signed)
Continue Protonix °

## 2022-02-18 NOTE — Progress Notes (Signed)
Physical Therapy Treatment ?Patient Details ?Name: Christine Brady ?MRN: [id number] ?DOB: [birth year] ?Today's Date: 02/18/2022 ? ? ?History of Present Illness Christine Brady is a 61 y.o. 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 PMH:  abdominal pain, osteoarthritis, atypical chest pain, history of bowel obstruction, cholesteatoma right ear, cochlear implant, chronic headaches, chronic ITP, hyperlipidemia, large ovary, GERD, R TKA in 10/22 ? ?  ?PT Comments  ? ? Progressing steadily.  LE's remain weak with tingling/burning pain.  Pt easily fatigues, but in spite of this, pt is improving well with OOB activity and gait in the halls. ?   ?Recommendations for follow up therapy are one component of a multi-disciplinary discharge planning process, led by the attending physician.  Recommendations may be updated based on patient status, additional functional criteria and insurance authorization. ? ?Follow Up Recommendations ? Outpatient PT ?  ?  ?Assistance Recommended at Discharge PRN  ?Patient can return home with the following Assist for transportation;Assistance with cooking/housework;A little help with walking and/or transfers;A little help with bathing/dressing/bathroom ?  ?Equipment Recommendations ? Cane  ?  ?Recommendations for Other Services   ? ? ?  ?Precautions / Restrictions Precautions ?Precautions: Fall ?Precaution Comments: bilat LE numbness, R TKA october '21 & still rocovering  ?  ? ?Mobility ? Bed Mobility ?Overal bed mobility: Modified Independent ?  ?  ?  ?  ?  ?  ?General bed mobility comments: HOB elevated ?  ? ?Transfers ?Overall transfer level: Modified independent ?  ?  ?  ?  ?  ?  ?  ?  ?  ?  ? ?Ambulation/Gait ?Ambulation/Gait assistance: Modified independent (Device/Increase time) ?Gait Distance (Feet): 300 Feet ?Assistive device: Straight cane ?Gait Pattern/deviations: Step-through  pattern ?Gait velocity: dec ?Gait velocity interpretation: <1.8 ft/sec, indicate of risk for recurrent falls ?  ?General Gait Details: safe with SPC and steady speed.  Pt reports having walked multiple times a day except yesterday. ? ? ?Stairs ?  ?  ?  ?  ?  ? ? ?Wheelchair Mobility ?  ? ?Modified Rankin (Stroke Patients Only) ?  ? ? ?  ?Balance Overall balance assessment: Needs assistance ?  ?Sitting balance-Leahy Scale: Normal ?  ?  ?Standing balance support: No upper extremity supported, Reliant on assistive device for balance ?Standing balance-Leahy Scale: Fair ?Standing balance comment: reliant on AD (cane) for safety to be mod I in the halls. ?  ?  ?  ?  ?  ?  ?  ?  ?  ?  ?  ?  ? ?  ?Cognition Arousal/Alertness: Awake/alert ?Behavior During Therapy: [facility] for tasks assessed/performed ?Overall Cognitive Status: Within Functional Limits for tasks assessed ?  ?  ?  ?  ?  ?  ?  ?  ?  ?  ?  ?  ?  ?  ?  ?  ?General Comments: oriented x4 ?  ?  ? ?  ?Exercises General Exercises - Lower Extremity ?Hip ABduction/ADduction: AROM, 10 reps, Standing, Both ?Hip Flexion/Marching: AROM, Both, 10 reps, Standing ? ?  ?General Comments   ?  ?  ? ?Pertinent Vitals/Pain Pain Assessment ?Pain Assessment: Faces ?Faces Pain Scale: No hurt ?Pain Intervention(s): Monitored during session, Other (comment) (headache gone with pain meds)  ? ? ?Home Living   ?  ?  ?  ?  ?  ?  ?  ?  ?  ?   ?  ?  Prior Function    ?  ?  ?   ? ?PT Goals (current goals can now be found in the care plan section) Acute Rehab PT Goals ?Patient Stated Goal: home ?PT Goal Formulation: With patient ?Time For Goal Achievement: 03/02/22 ?Potential to Achieve Goals: Good ?Progress towards PT goals: Progressing toward goals ? ?  ?Frequency ? ? ? Min 4X/week ? ? ? ?  ?PT Plan Current plan remains appropriate  ? ? ?Co-evaluation   ?  ?  ?  ?  ? ?  ?AM-PAC PT "6 Clicks" Mobility   ?Outcome Measure ? Help needed turning from your back to your side while in a flat bed without  using bedrails?: None ?Help needed moving from lying on your back to sitting on the side of a flat bed without using bedrails?: None ?Help needed moving to and from a bed to a chair (including a wheelchair)?: None ?Help needed standing up from a chair using your arms (e.g., wheelchair or bedside chair)?: None ?Help needed to walk in hospital room?: None ?Help needed climbing 3-5 steps with a railing? : A Little ?6 Click Score: 23 ? ?  ?End of Session   ?Activity Tolerance: Patient tolerated treatment well ?Patient left: in bed;with call bell/phone within reach ?Nurse Communication: Mobility status ?PT Visit Diagnosis: Unsteadiness on feet (R26.81);Other abnormalities of gait and mobility (R26.89) ?  ? ? ?Time: 5035-4656 ?PT Time Calculation (min) (ACUTE ONLY): 27 min ? ?Charges:  $Gait Training: 8-22 mins ?$Therapeutic Exercise: 8-22 mins          ?          ? ?02/18/2022 ? ?Ginger Carne., PT ?Acute Rehabilitation Services ?[phone]  (pager) ?[phone]  (office) ? ? ?Tessie Fass Artavia Jeanlouis ?02/18/2022, 5:30 PM ? ?

## 2022-02-18 NOTE — Plan of Care (Signed)
Pt is alert oriented x 4. Pt ambulatory. Pt c/o numbness and tingling bil lower exrtremities up to waist area. Pt states numbness and tingling to lips and tongue is gone. Pt does however have a headache that will not go away with medication prn oxycodone. Prn tylenol given for fever with effective results. Pt does verbalize that she does not feel well today.  ? ? ?Problem: Education: ?Goal: Knowledge of General Education information will improve ?Description: Including pain rating scale, medication(s)/side effects and non-pharmacologic comfort measures ?Outcome: Progressing ?  ?Problem: Health Behavior/Discharge Planning: ?Goal: Ability to manage health-related needs will improve ?Outcome: Progressing ?  ?Problem: Clinical Measurements: ?Goal: Ability to maintain clinical measurements within normal limits will improve ?Outcome: Progressing ?Goal: Will remain free from infection ?Outcome: Progressing ?Goal: Diagnostic test results will improve ?Outcome: Progressing ?Goal: Respiratory complications will improve ?Outcome: Progressing ?Goal: Cardiovascular complication will be avoided ?Outcome: Progressing ?  ?Problem: Activity: ?Goal: Risk for activity intolerance will decrease ?Outcome: Progressing ?  ?Problem: Nutrition: ?Goal: Adequate nutrition will be maintained ?Outcome: Progressing ?  ?Problem: Coping: ?Goal: Level of anxiety will decrease ?Outcome: Progressing ?  ?Problem: Elimination: ?Goal: Will not experience complications related to bowel motility ?Outcome: Progressing ?Goal: Will not experience complications related to urinary retention ?Outcome: Progressing ?  ?Problem: Pain Managment: ?Goal: General experience of comfort will improve ?Outcome: Progressing ?  ?Problem: Safety: ?Goal: Ability to remain free from injury will improve ?Outcome: Progressing ?  ?Problem: Skin Integrity: ?Goal: Risk for impaired skin integrity will decrease ?Outcome: Progressing ?  ?

## 2022-02-18 NOTE — Progress Notes (Signed)
Neurology Progress Note ? ?Subjective: ?Patient with continued tingling and numbness with complaints of severe pins and needles overnight.  ?Patient continues to endorse some improvement in her painful paraesthesias overnight after initiation of gabapentin.  ?She reports that she has no longer experienced hand or fingertip involvement of paraesthesias with ambulation. ?Patient states that she has had ongoing frontal headaches up to an 8/10 in severity associated with PLEX therapies with a current headache on exam. She did have an elevated temperature overnight with a temperature of 101.6 degrees Fahrenheit, infectious work up underway per primary team, no leukocytosis on chart review. ? ?Exam: ?Vitals:  ? 02/18/22 0540 02/18/22 0729  ?BP: 95/61 98/60  ?Pulse: 84 82  ?Resp: 14 16  ?Temp: 98.9 ?F (37.2 ?C) 99.2 ?F (37.3 ?C)  ?SpO2: 94% 98%  ? ?Gen: Laying comfortably in bed, in no acute distress ?Resp: non-labored breathing, no respiratory distress on room air ?Abd: soft, non-tender, non-distended ? ?Neuro: ?Mental Status: Awake, alert, and oriented throughout ?Speech is intact without dysarthria or aphasia ?Follows commands without difficulty ?No neglect noted ?Cranial Nerves:PERRL, EOMI, visual fields are full, facial sensation is intact and symmetric to light touch, face is symmetric resting and smiling, hearing is intact to voice, shoulders shrug midline, tongue is midline ?Motor: 5/5 strength throughout without vertical drift.  ?Tone and bulk are normal  ?Sensory: Decreased sensation to light touch reported to bilateral lower extremities. Cool temperature is intact but decreased proximal to the knee. There is constant tingling sensation in bilateral feet with complete numbness reported to the bottom of bilateral feet.  ?DTR: 2+ left patellae, unable to elicit right patellae (pain due to recent knee surgery). 2+ and symmetric biceps.  ? ?Pertinent Labs: ?CBC ?   ?Component Value Date/Time  ? WBC 5.7 02/18/2022  0453  ? RBC 3.13 (L) 02/18/2022 0453  ? HGB 10.3 (L) 02/18/2022 0453  ? HGB 12.6 01/20/2022 0938  ? HGB 12.5 04/24/2009 1519  ? HCT 30.8 (L) 02/18/2022 0453  ? HCT 37.0 04/24/2009 1519  ? PLT 77 (L) 02/18/2022 0453  ? PLT 119 (L) 01/20/2022 8413  ? PLT 96 (L) 04/24/2009 1519  ? MCV 98.4 02/18/2022 0453  ? MCV 91.8 04/24/2009 1519  ? MCH 32.9 02/18/2022 0453  ? MCHC 33.4 02/18/2022 0453  ? RDW 13.1 02/18/2022 0453  ? RDW 13.1 04/24/2009 1519  ? LYMPHSABS 1.9 02/18/2022 0453  ? LYMPHSABS 1.6 04/24/2009 1519  ? MONOABS 0.5 02/18/2022 0453  ? MONOABS 0.3 04/24/2009 1519  ? EOSABS 0.1 02/18/2022 0453  ? EOSABS 0.1 04/24/2009 1519  ? BASOSABS 0.0 02/18/2022 0453  ? BASOSABS 0.0 04/24/2009 1519  ? ?CMP  ?   ?Component Value Date/Time  ? NA 136 02/18/2022 0453  ? K 4.4 02/18/2022 0453  ? CL 108 02/18/2022 0453  ? CO2 21 (L) 02/18/2022 0453  ? GLUCOSE 106 (H) 02/18/2022 0453  ? BUN <5 (L) 02/18/2022 0453  ? CREATININE 0.73 02/18/2022 0453  ? CREATININE 0.68 01/29/2022 0948  ? CALCIUM 8.5 (L) 02/18/2022 0453  ? PROT 4.5 (L) 02/18/2022 0453  ? ALBUMIN 3.6 02/18/2022 0453  ? AST 15 02/18/2022 0453  ? AST 19 01/29/2022 0948  ? ALT 8 02/18/2022 0453  ? ALT 60 (H) 01/29/2022 0948  ? ALKPHOS 11 (L) 02/18/2022 0453  ? BILITOT 0.8 02/18/2022 0453  ? BILITOT 0.5 01/29/2022 0948  ? GFRNONAA >60 02/18/2022 0453  ? GFRNONAA >60 01/29/2022 0948  ? GFRAA >60 07/12/2019 0333  ? ?Imaging Reviewed: ? ?  CT chest, abdomen, and pelvis with contrast 3/5: ?1. No acute abnormality in the chest, abdomen or pelvis. ?2. Right colectomy. Large amount of stool in the remainder of the colon. ? ?Assessment: 61 y.o. female with PMH significant for PMH significant for chronic ITP, HLD, arthritis and s/p recent R Knee replacement, DVTs in L popliteal vein in Dec 2022 and on Eliquis who presents with BL leg numbness that started in Nov. 2022 in her feet and was stable until February and now has numbness up to her waist along with numbness of her entire face.  Her symptoms are concerning for potential small fiber neuropathy. Her neurologic examination is notable for numbness and pain in BL feet and feeling off balance. ?  ?Work-up so far with mildly elevated RNP which I do not think is significant. Her Oligoclonal bands shows 3 identical gamma restriction bands in CSF and serum. This suggests that the antibody production is systemic rather than in the CNS. 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 She does not seem to be report symptoms consistent with neuromyotonia. ?  ?CASPR2 Ab associated disorder can present with peripheral nerve hyperexcitability and neuropathic pain. ([email or web address]) ? ?Recommendations: ?- PLEX 5/5 completed on 02/17/2022 ?- Continue gabapentin '100mg'$  qhs for painful paresthesias as she endorses improvement of painful night paresthesias with initiation of this medication.  ?- Will need close neurology follow up outpatient for EMG/NCS ?- Please contact neurology with further questions or concerns.  ?  ?Anibal Henderson, AGACNP-BC ?[facility] ?[phone] ? ?Attending Neurohospitalist Addendum ?Patient seen and examined with APP/Resident. ?Agree with the history and physical as documented above. ?Agree with the plan as documented, which I helped formulate. ?I have independently reviewed the chart, obtained history, review of systems and examined the patient.I have personally reviewed pertinent head/neck/spine imaging (CT/MRI). ?Discussed with Dr. Tana Coast. Will briefly see patient tomorrow to make sure she is doing well and touch base with primary  team. ?Please feel free to call with any questions. ? ?-- ?Amie Portland, MD ?Neurologist ?[facility] ?Pager: [phone] ? ? ?

## 2022-02-18 NOTE — Plan of Care (Signed)

## 2022-02-18 NOTE — Progress Notes (Signed)
Patient handbook detailing the patient's rights, responsibilities, and visitor guidelines was given. ?

## 2022-02-18 NOTE — Progress Notes (Signed)
? ? ? [facility] ?                                                                            ? ? ?Christine Brady, is a 61 y.o. female, DOB - 09/13/1961, YHC:[id number] ?Admit date - 02/09/2022    ?Outpatient Primary MD for the patient is Ronita Hipps, MD ? ?LOS - 9  days ? ? ? ?Brief summary  ? 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  Hemodynamically stable in the ER.  ? CT lumbar spine 2 weeks ago with abnormal lumbar nerve root thickening and nodularity, patient could not have MRI because of cochlear implant.  Admitted with neurology consultation.  Currently receiving plasma exchange. ? ?Completed plasma exchange 5/5 on 02/17/2022 ? ? ?Assessment & Plan  ? ? ?Assessment and Plan: ?* Acute inflammatory demyelinating polyneuropathy (HCC) ?- CT head essentially normal, CT venogram of the head without any evidence of dural sinus stenosis. 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 ?-Complaining of persisting headache, not improving with oxycodone or tramadol.  Gave Toradol 15 mg IV x1, Reglan 10 mg IV x1, Benadryl with significant improvement and resolution of the headache. ?-Neurology following ? ?Deep venous thrombosis of lower extremity ([place]) ?- Continue eliquis ? ?Idiopathic thrombocytopenia (HCC) ?- Platelet count slightly lower today, no bleeding, possibly dilutional ?- will follow CBC in a.m., will discuss with her hematologist, Dr. Ammie Dalton if worsening thrombocytopenia ? ?GERD  (gastroesophageal reflux disease) ?- Continue Protonix ? ? ?Code Status: Full code ?DVT Prophylaxis:   ?apixaban (ELIQUIS) tablet 5 mg  ? ?Level of Care: Level of care: Med-Surg ?Family Communication:  ? ?Disposition Plan:     Remains inpatient appropriate: Overnight spiked fever, platelets trending down, worsening headache, will hold off discharge today and observe overnight. ? ?Procedures:  ?Plasma exchange ? ?Consultants:   ?Neurology ? ?Antimicrobials:  ? ?Medications ? ? apixaban  5 mg Oral BID  ? calcium-vitamin D  2 tablet Oral Daily  ? Chlorhexidine Gluconate Cloth  6 each Topical Daily  ? gabapentin  100 mg Oral QHS  ? ketorolac  15 mg Intravenous Once  ? metoCLOPramide (REGLAN) injection  10 mg Intravenous Once  ? pantoprazole  40 mg Oral Daily  ? thiamine injection  100 mg Intravenous Daily  ? ? ? ?Subjective:  ? ?Christine Brady was seen and examined today.  Overnight spiked fever 101.6 ?F, borderline BP, headache.  However now afebrile, work-up so far negative.  Complaining of persisting headache.  Patient denies chest pain, shortness of breath, abdominal pain, N/V/D/C, new weakness, numbess, tingling.  ? ?Objective:  ? ?Vitals:  ? 02/18/22 0357 02/18/22 0540 02/18/22 0729 02/18/22 1107  ?BP: (!) '88/50 95/61 98/60 '$ 101/63  ?Pulse: 99  84 82 92  ?Resp: '14 14 16 16  '$ ?Temp: 99.9 ?F (37.7 ?C) 98.9 ?F (37.2 ?C) 99.2 ?F (37.3 ?C)   ?TempSrc: Oral Oral Oral   ?SpO2: 93% 94% 98% 95%  ?Weight:      ?Height:      ? ? ?Intake/Output Summary (Last 24 hours) at 02/18/2022 1535 ?Last data filed at 02/17/2022 1828 ?Gross per 24 hour  ?Intake 2029.19 ml  ?Output --  ?Net 2029.19 ml  ? ?Filed Weights  ? 02/13/22 1546 02/14/22 0500 02/15/22 0855  ?Weight: 64.5 kg 63.1 kg 63.3 kg  ? ? ? ?Exam ?General: Alert and oriented x 3, NAD ?Cardiovascular: S1 S2 auscultated, no murmurs, RRR ?Respiratory: Clear to auscultation bilaterally, no wheezing, rales or rhonchi ?Gastrointestinal: Soft, nontender, nondistended, + bowel sounds ?Ext:  no pedal edema bilaterally ?Neuro: no new deficits ?Psych: Normal affect and demeanor, alert and oriented x3  ? ? ?Data Reviewed:  I have personally reviewed following labs  ? ? ?CBC ?Lab Results  ?Component Value Date  ? WBC 5.7 02/18/2022  ? RBC 3.13 (L) 02/18/2022  ? HGB 10.3 (L) 02/18/2022  ? HCT 30.8 (L) 02/18/2022  ? MCV 98.4 02/18/2022  ? MCH 32.9 02/18/2022  ? PLT 77 (L) 02/18/2022  ? MCHC 33.4 02/18/2022  ? RDW 13.1 02/18/2022  ? LYMPHSABS 1.9 02/18/2022  ? MONOABS 0.5 02/18/2022  ? EOSABS 0.1 02/18/2022  ? BASOSABS 0.0 02/18/2022  ? ? ? ?Last metabolic panel ?Lab Results  ?Component Value Date  ? NA 136 02/18/2022  ? K 4.4 02/18/2022  ? CL 108 02/18/2022  ? CO2 21 (L) 02/18/2022  ? BUN <5 (L) 02/18/2022  ? CREATININE 0.73 02/18/2022  ? GLUCOSE 106 (H) 02/18/2022  ? GFRNONAA >60 02/18/2022  ? GFRAA >60 07/12/2019  ? CALCIUM 8.5 (L) 02/18/2022  ? PROT 4.5 (L) 02/18/2022  ? ALBUMIN 3.6 02/18/2022  ? LABGLOB 2.4 02/09/2022  ? AGRATIO 1.4 02/09/2022  ? BILITOT 0.8 02/18/2022  ? ALKPHOS 11 (L) 02/18/2022  ? AST 15 02/18/2022  ? ALT 8 02/18/2022  ? ANIONGAP 7 02/18/2022  ? ? ? ?Radiology Studies: I have personally reviewed the imaging studies  ?DG Chest 1 View ? ?Result Date: 02/18/2022 ?CLINICAL DATA:  Pneumonia. EXAM: CHEST  1 VIEW COMPARISON:  Apr 18, 2021 FINDINGS: The heart size and mediastinal contours are within normal limits. Both lungs are clear. Radiopaque surgical clips are seen within the right upper quadrant. The visualized skeletal structures are unremarkable. IMPRESSION: No active cardiopulmonary disease. Electronically Signed   By: Virgina Norfolk M.D.   On: 02/18/2022 00:29   ? ? ? ? ?Estill Cotta M.D. ?[facility] ?02/18/2022, 3:35 PM ? ?Available via Epic secure chat 7am-7pm ?After 7 pm, please refer to night coverage provider listed on amion. ? ?  ?

## 2022-02-19 ENCOUNTER — Other Ambulatory Visit (HOSPITAL_COMMUNITY): Payer: Self-pay

## 2022-02-19 DIAGNOSIS — N390 Urinary tract infection, site not specified: Secondary | ICD-10-CM | POA: Diagnosis present

## 2022-02-19 DIAGNOSIS — S83281D Other tear of lateral meniscus, current injury, right knee, subsequent encounter: Secondary | ICD-10-CM

## 2022-02-19 LAB — CBC
HCT: 33.2 % — ABNORMAL LOW (ref 36.0–46.0)
Hemoglobin: 11.3 g/dL — ABNORMAL LOW (ref 12.0–15.0)
MCH: 33.3 pg (ref 26.0–34.0)
MCHC: 34 g/dL (ref 30.0–36.0)
MCV: 97.9 fL (ref 80.0–100.0)
Platelets: 103 10*3/uL — ABNORMAL LOW (ref 150–400)
RBC: 3.39 MIL/uL — ABNORMAL LOW (ref 3.87–5.11)
RDW: 12.9 % (ref 11.5–15.5)
WBC: 6.4 10*3/uL (ref 4.0–10.5)
nRBC: 0 % (ref 0.0–0.2)

## 2022-02-19 LAB — URINALYSIS, COMPLETE (UACMP) WITH MICROSCOPIC
Bilirubin Urine: NEGATIVE
Glucose, UA: NEGATIVE mg/dL
Hgb urine dipstick: NEGATIVE
Ketones, ur: NEGATIVE mg/dL
Nitrite: POSITIVE — AB
Protein, ur: NEGATIVE mg/dL
Specific Gravity, Urine: 1.027 (ref 1.005–1.030)
WBC, UA: >50 WBC/hpf — ABNORMAL HIGH (ref 0–5)
pH: 5 (ref 5.0–8.0)

## 2022-02-19 MED ORDER — THIAMINE HCL 100 MG PO TABS
100.0000 mg | ORAL_TABLET | Freq: Every day | ORAL | 1 refills | Status: DC
Start: 2022-02-19 — End: 2022-06-03
  Filled 2022-02-19: qty 30, 30d supply, fill #0

## 2022-02-19 MED ORDER — TRAMADOL HCL 50 MG PO TABS
50.0000 mg | ORAL_TABLET | Freq: Four times a day (QID) | ORAL | 0 refills | Status: DC | PRN
  Filled 2022-02-19: qty 20, 5d supply, fill #0

## 2022-02-19 MED ORDER — URELLE 81 MG PO TABS
1.0000 | ORAL_TABLET | Freq: Four times a day (QID) | ORAL | 0 refills | Status: DC | PRN
  Filled 2022-02-19: qty 30, 8d supply, fill #0

## 2022-02-19 MED ORDER — URELLE 81 MG PO TABS
1.0000 | ORAL_TABLET | Freq: Three times a day (TID) | ORAL | Status: DC
  Administered 2022-02-19: 81 mg via ORAL
  Filled 2022-02-19 (×3): qty 1

## 2022-02-19 MED ORDER — KETOROLAC TROMETHAMINE 15 MG/ML IJ SOLN
15.0000 mg | Freq: Once | INTRAMUSCULAR | Status: AC
  Administered 2022-02-19: 15 mg via INTRAVENOUS
  Filled 2022-02-19: qty 1

## 2022-02-19 MED ORDER — LIDOCAINE 4 % EX CREA
TOPICAL_CREAM | Freq: Four times a day (QID) | CUTANEOUS | 0 refills | Status: DC | PRN
  Filled 2022-02-19: qty 30, fill #0

## 2022-02-19 MED ORDER — CEPHALEXIN 500 MG PO CAPS
500.0000 mg | ORAL_CAPSULE | Freq: Two times a day (BID) | ORAL | Status: DC
  Administered 2022-02-19: 500 mg via ORAL
  Filled 2022-02-19: qty 1

## 2022-02-19 MED ORDER — SENNOSIDES-DOCUSATE SODIUM 8.6-50 MG PO TABS
1.0000 | ORAL_TABLET | Freq: Once | ORAL | Status: AC
  Administered 2022-02-19: 1 via ORAL
  Filled 2022-02-19: qty 1

## 2022-02-19 MED ORDER — CEPHALEXIN 500 MG PO CAPS
500.0000 mg | ORAL_CAPSULE | Freq: Two times a day (BID) | ORAL | 0 refills | Status: AC
Start: 2022-02-19 — End: 2022-02-22
  Filled 2022-02-19: qty 6, 3d supply, fill #0

## 2022-02-19 MED ORDER — THIAMINE HCL 100 MG PO TABS
100.0000 mg | ORAL_TABLET | Freq: Every day | ORAL | 1 refills | Status: DC
  Filled 2022-02-19: qty 30, 30d supply, fill #0

## 2022-02-19 MED ORDER — GABAPENTIN 100 MG PO CAPS
100.0000 mg | ORAL_CAPSULE | Freq: Every day | ORAL | 3 refills | Status: DC
  Filled 2022-02-19: qty 30, 30d supply, fill #0

## 2022-02-19 MED ORDER — CELECOXIB 100 MG PO CAPS: 100.0000 mg | ORAL_CAPSULE | Freq: Two times a day (BID) | ORAL | Status: DC | PRN

## 2022-02-19 MED ORDER — ONDANSETRON HCL 4 MG PO TABS
4.0000 mg | ORAL_TABLET | Freq: Three times a day (TID) | ORAL | 0 refills | Status: DC | PRN
  Filled 2022-02-19: qty 30, 10d supply, fill #0

## 2022-02-19 NOTE — Assessment & Plan Note (Signed)
-   Complaining of dysuria, increased urinary frequency, UA positive for UTI, urine culture showed E. Coli ?-Placed on Keflex for 3 days, urelle  ?

## 2022-02-19 NOTE — Plan of Care (Signed)
Spoke with the patient over the phone ?Has some urinary burning ?Noted to have UTI ?Primary team treating the UTI ?Needs follow-up with Dr. Lavell Anchors at [facility] in the next 2 to 4 weeks ?No further inpatient neurological interventions planned ?Plan relayed to Dr. Tana Coast ? ?-- ?Amie Portland, MD ?Neurologist ?[facility] ?Pager: [phone] ? ?

## 2022-02-19 NOTE — Plan of Care (Signed)
Pt is alert oriented x 4. Pt ambulatory. Pt has numbness and tingling from waist down. Pt c/o headache not relieved by PRN oxycodone or tylenol. On call provider paged and informed and received x 1 order for toradol. Pt also c/o new onset urgency, burning and feeling that bladder is full. Pt was bladder scanned, 72m noted. Received order for UA, obtained.  ? ?Problem: Education: ?Goal: Knowledge of General Education information will improve ?Description: Including pain rating scale, medication(s)/side effects and non-pharmacologic comfort measures ?Outcome: Progressing ?  ?Problem: Health Behavior/Discharge Planning: ?Goal: Ability to manage health-related needs will improve ?Outcome: Progressing ?  ?Problem: Clinical Measurements: ?Goal: Ability to maintain clinical measurements within normal limits will improve ?Outcome: Progressing ?Goal: Will remain free from infection ?Outcome: Progressing ?Goal: Diagnostic test results will improve ?Outcome: Progressing ?Goal: Respiratory complications will improve ?Outcome: Progressing ?Goal: Cardiovascular complication will be avoided ?Outcome: Progressing ?  ?Problem: Activity: ?Goal: Risk for activity intolerance will decrease ?Outcome: Progressing ?  ?Problem: Nutrition: ?Goal: Adequate nutrition will be maintained ?Outcome: Progressing ?  ?Problem: Coping: ?Goal: Level of anxiety will decrease ?Outcome: Progressing ?  ?Problem: Elimination: ?Goal: Will not experience complications related to bowel motility ?Outcome: Progressing ?Goal: Will not experience complications related to urinary retention ?Outcome: Progressing ?  ?Problem: Pain Managment: ?Goal: General experience of comfort will improve ?Outcome: Progressing ?  ?Problem: Safety: ?Goal: Ability to remain free from injury will improve ?Outcome: Progressing ?  ?Problem: Skin Integrity: ?Goal: Risk for impaired skin integrity will decrease ?Outcome: Progressing ?  ?

## 2022-02-19 NOTE — Progress Notes (Signed)
Discharge instructions (including medications) discussed with and copy provided to patient/caregiver 

## 2022-02-19 NOTE — Discharge Summary (Signed)
Physician Discharge Summary   Patient: Christine Brady MRN: [id number] DOB: Oct 23, 1961  Admit date:     02/09/2022  Discharge date: 02/19/22  Discharge Physician: Estill Cotta   PCP: Ronita Hipps, MD   Recommendations at discharge:   Continue Keflex 500 mg p.o. 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  Hemodynamically stable in the ER.   CT lumbar spine 2 weeks ago with abnormal lumbar nerve root thickening and nodularity, patient could not have MRI because of cochlear implant.  Admitted with neurology consultation.  Currently receiving plasma exchange.  Completed plasma exchange 5/5 on 02/17/2022  Assessment and Plan: * Acute inflammatory demyelinating polyneuropathy (HCC) - CT head essentially normal, CT venogram of the head without any evidence of dural sinus stenosis. -TDC placed, patient underwent plasma exchange, completed #5/5 session on 3/8.  TDC removed. -Completed high-dose IV thiamine.  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  Patient was followed by neurology. -Headache improved with Toradol, Reglan and Benadryl.   -She will follow-up outpatient with her neurologist, Dr. Jaynee Eagles.  UTI (urinary tract infection) - Complaining of dysuria, increased urinary frequency, UA positive for UTI, urine culture showed E. Coli -Placed on Keflex for 3 days, urelle   Deep venous  thrombosis of lower extremity (HCC) - Continue eliquis  Idiopathic thrombocytopenia (HCC) - Platelet counts improving, outpatient follow-up with neurology  GERD (gastroesophageal reflux disease) - Continue Protonix     Pain control - [place] Controlled Substance Reporting System database was reviewed. and patient was instructed, not to drive, operate heavy machinery, perform activities at heights, swimming or participation in water activities or provide baby-sitting services while on Pain, Sleep and Anxiety Medications; until their outpatient Physician has advised to do so again. Also recommended to not to take more than prescribed Pain, Sleep and Anxiety Medications.  Consultants: Neurology, IR Procedures performed: Plasma exchange, TDC placement and removal Disposition: Home Diet recommendation:  Discharge Diet Orders (From admission, onward)     Start     Ordered   02/19/22 0000  Diet - low sodium heart healthy        02/19/22 1143           Regular diet DISCHARGE MEDICATION: Allergies as of 02/19/2022       Reactions   Nsaids Other (See Comments)   The patient was told to avoid NSAID(s) at this time- is taking Eliquis   Oxycodone Other (See Comments)   "Skin crawling"   Claritin-d [loratadine-pseudoephedrine Er] Rash   Codeine Rash   Hydrocodone-acetaminophen Itching   Loratadine Rash   Lyrica [pregabalin] Other (See Comments)   Caused the patient to feel "foggy-headed"        Medication List     STOP taking these medications    dexamethasone 4 MG tablet Commonly known as: DECADRON   HYDROcodone-acetaminophen 5-325 MG tablet Commonly known as: NORCO/VICODIN   pregabalin 100 MG capsule Commonly known  as: Lyrica   triamcinolone ointment 0.1 % Commonly known as: KENALOG       TAKE these medications    Calcium Carbonate-Vitamin D3 600-400 MG-UNIT Tabs Take 2 tablets by mouth daily. Notes to patient: Start on 02/20/22   celecoxib 100 MG  capsule Commonly known as: CELEBREX Take 1 capsule (100 mg total) by mouth 2 (two) times daily as needed for mild pain.   cephALEXin 500 MG capsule Commonly known as: KEFLEX Take 1 capsule (500 mg total) by mouth 2 (two) times daily for 3 days. Notes to patient: Start in the evening on 02/19/22   dicyclomine 10 MG capsule Commonly known as: BENTYL Take 1 capsule (10 mg total) by mouth every 8 (eight) hours as needed for spasms.   Dodex 1000 MCG/ML injection Generic drug: cyanocobalamin Inject 1 ml into the muscle once a month What changed:  how much to take how to take this when to take this Notes to patient: Once a month per normal schedule   Eliquis 5 MG Tabs tablet Generic drug: apixaban Take 2 tablet by mouth 2 times a day for 7 days then 1 tablet by mouth 2 times a day What changed:  how much to take how to take this when to take this Notes to patient: Start on 02/20/22   gabapentin 100 MG capsule Commonly known as: NEURONTIN Take 1 capsule (100 mg total) by mouth at bedtime. Notes to patient: Start at bedtime on 02/19/22   Hyoscyamine Sulfate SL 0.125 MG Subl Place 0.125 mg under the tongue daily as needed (for stomach muscle cramping).   IBgard 90 MG Cpcr Generic drug: Peppermint Oil Take 1 capsule by mouth daily. Notes to patient: Start on 02/20/22   lidocaine 4 % cream Commonly known as: LMX Apply topically 4 (four) times daily as needed (apply to BL feet for neuropathic pain). Apply to both feet   methocarbamol 500 MG tablet Commonly known as: ROBAXIN Take 1 tablet by mouth every 12 hours as needed. What changed: reasons to take this   omeprazole 40 MG capsule Commonly known as: PRILOSEC Take 1 capsule (40 mg total) by mouth daily. What changed: when to take this Notes to patient: Start on 02/20/22   ondansetron 4 MG tablet Commonly known as: Zofran Take 1 tablet (4 mg total) by mouth every 8 (eight) hours as needed for nausea or vomiting.    One-A-Day Womens Formula Tabs Take 1 tablet by mouth daily with breakfast. Notes to patient: Start on 02/20/22   polyethylene glycol powder 17 GM/SCOOP powder Commonly known as: GLYCOLAX/MIRALAX Take 1 scoop mixed in 8 oz of fluid daily What changed:  how much to take how to take this when to take this additional instructions Notes to patient: Start on 02/20/22   PROBIOTIC PO Take 1 capsule by mouth in the morning. Notes to patient: Start on 02/20/22   rosuvastatin 5 MG tablet Commonly known as: CRESTOR TAKE 1 TABLET BY MOUTH TWICE WEEKLY What changed:  how much to take how to take this when to take this additional instructions Notes to patient: Start on 02/20/22 and take twice weekly   temazepam 15 MG capsule Commonly known as: RESTORIL Take 1 capsule (15 mg total) by mouth at bedtime as needed for sleep.   thiamine 100 MG tablet Take 1 tablet (100 mg total) by mouth daily.   traMADol 50 MG tablet Commonly known as: Ultram Take 1 tablet (50 mg total) by mouth every 6 (six) hours as needed  for severe pain or moderate pain.   traZODone 50 MG tablet Commonly known as: DESYREL Take 50 mg by mouth at bedtime as needed for sleep.   Urelle 81 MG Tabs tablet Take 1 tablet (81 mg total) by mouth every 6 (six) hours as needed for bladder spasms.               Durable Medical Equipment  (From admission, onward)           Start     Ordered   02/17/22 1203  For home use only DME Cane  Once        02/17/22 1202            Follow-up Information     Melvenia Beam, MD. Schedule an appointment as soon as possible for a visit in 10 day(s).   Specialty: Neurology Why: for hospital follow-up Contact information: [address] [place] [address] [phone]         Ronita Hipps, MD. Schedule an appointment as soon as possible for a visit in 2 week(s).   Specialty: Family Medicine Contact information: [place]  [phone]                Discharge Exam: Filed Weights   02/13/22 1546 02/14/22 0500 02/15/22 0855  Weight: 64.5 kg 63.1 kg 63.3 kg   S: No further fevers, headache improving.  Hoping to go home today.  Vitals:   02/18/22 1942 02/18/22 2325 02/19/22 0332 02/19/22 0735  BP: 104/65 99/60 (!) 82/56 (!) 96/52  Pulse: 73 74 78 85  Resp: $Remo'16 16 16 16  'fZCxA$ Temp: (!) 97.5 F (36.4 C) 98.3 F (36.8 C) 98.3 F (36.8 C) 98.7 F (37.1 C)  TempSrc: Oral Oral Oral Oral  SpO2: 99% 96% 97% 95%  Weight:      Height:        Physical Exam General: Alert and oriented x 3, NAD Cardiovascular: S1 S2 clear, RRR. No pedal edema b/l Respiratory: CTAB, no wheezing, rales or rhonchi Gastrointestinal: Soft, nontender, nondistended, NBS Ext: no pedal edema bilaterally Neuro: no acute new deficits   Condition at discharge: good  The results of significant diagnostics from this hospitalization (including imaging, microbiology, ancillary and laboratory) are listed below for reference.   Imaging Studies: DG Chest 1 View  Result Date: 02/18/2022 CLINICAL DATA:  Pneumonia. EXAM: CHEST  1 VIEW COMPARISON:  Apr 18, 2021 FINDINGS: The heart size and mediastinal contours are within normal limits. Both lungs are clear. Radiopaque surgical clips are seen within the right upper quadrant. The visualized skeletal structures are unremarkable. IMPRESSION: No active cardiopulmonary disease. Electronically Signed   By: Virgina Norfolk M.D.   On: 02/18/2022 00:29   CT HEAD W & WO CONTRAST (5MM)  Result Date: 02/10/2022 CLINICAL DATA:  Dural venous sinus thrombosis suspected EXAM: CT HEAD WITHOUT AND WITH CONTRAST CT VENOGRAPHY OF THE HEAD TECHNIQUE: Contiguous axial images were obtained from the base of the skull through the vertex without and with intravenous contrast. Venographic phase images of the brain were obtained following the administration of intravenous contrast. Multiplanar reformats and maximum  intensity projections were generated. RADIATION DOSE REDUCTION: This exam was performed according to the departmental dose-optimization program which includes automated exposure control, adjustment of the mA and/or kV according to patient size and/or use of iterative reconstruction technique. CONTRAST:  46mL OMNIPAQUE IOHEXOL 350 MG/ML SOLN COMPARISON:  None. FINDINGS: CT HEAD Streak artifact from right  cochlear implant. Findings below within this limitation. Brain: There is no acute intracranial hemorrhage, mass, mass effect, or edema. No abnormal enhancement. Gray-white differentiation is preserved. There is no extra-axial fluid collection. Ventricles and sulci are within normal limits in size and configuration. Vascular: No hyperdense vessel or unexpected calcification. Skull: Calvarium is unremarkable. Sinuses/Orbits: No acute finding. Other: There are no internal electrodes associated with cochlear implant. Prior canal wall up mastoidectomy. Nonspecific opacification of the mastoidectomy bowl extending into the middle ear cleft. Probable left ossicle implant. CTV HEAD Superior sagittal sinus, straight sinus, vein of Galen, and internal cerebral veins are patent. Left transverse and sigmoid sinuses are patent. The right transverse and sigmoid sinuses are partially obscured by artifact. Visualized portions are patent. IMPRESSION: No acute intracranial abnormality within limitation of streak artifact from cochlear implant apparatus. Of note, there are no apparent electrodes associated with the implant. No evidence of dural sinus thrombosis within limitation of streak artifact. Electronically Signed   By: Macy Mis M.D.   On: 02/10/2022 08:46   CT LUMBAR SPINE W CONTRAST  Result Date: 01/27/2022 CLINICAL DATA:  Low back pain.  No previous lumbar surgery. EXAM: LUMBAR MYELOGRAM CT LUMBAR SPINE WITH INTRATHECAL CONTRAST FLUOROSCOPY: Radiation Exposure Index (as provided by the fluoroscopic device): 23.4 mGy  Kerma TECHNIQUE: The procedure, risks (including but not limited to bleeding, infection, organ damage ), benefits, and alternatives were explained to the patient. Questions regarding the procedure were encouraged and answered. The patient understands and consents to the procedure. An appropriate entry site was determined under fluoroscopy. Operator donned sterile gloves and mask. Skin site was marked, prepped with Betadine, and draped in usual sterile fashion, and infiltrated locally with 1% lidocaine. A 22 gauge spinal needle was advanced into the thecal sac at L2 from a right parasagittal approach. Clear colorless CSF returned. 17 ml Omnipaque 180 were administered intrathecally for lumbar myelography, followed by axial CT scanning of the lumbar spine. I personally performed the lumbar puncture and administered the intrathecal contrast. I also personally supervised acquisition of the myelogram images. Coronal and sagittal reconstructions were generated from the axial scan. This exam was performed according to the departmental dose-optimization program which includes automated exposure control, adjustment of the mA and/or kV according to patient size and/or use of iterative reconstruction technique. COMPARISON:  MR 07/15/2017 and previous FINDINGS: Numbering scheme follows that used on previous MRI, lowest open interspace assigned L5-S1. normal alignment. Negative for fracture. No dynamic instability on lateral flexion/extension. Multiple lumbar nerve roots in the thecal sac are abnormally thickened and mildly nodular, new since previous MR 07/15/2017. T12-L1: Hypoplastic ribs at T12. Mild circumferential disc bulge with anterior endplate spurring. Central canal and foramina patent. Conus terminates behind L1. L1-2: No significant disc bulge or protrusion. Central canal and foramina patent. L2-3: Minimal disc bulge. Central canal and foramina remain widely patent. L3-4: Mild circumferential disc bulge. No spinal or  foraminal stenosis. L4-5: Mild broad posterior bulge. Mild foraminal encroachment right greater than left, and right subarticular recess narrowing. Early facet DJD with some thickening of ligamentum flavum. No significant spinal stenosis. L5-S1: Shallow right posterolateral and foraminal protrusion, with slight posterior displacement of the right S1 nerve root. No spinal stenosis. Early right foraminal encroachment. Visualized paraspinal soft tissues unremarkable. IMPRESSION: 1. Abnormal lumbar nerve root thickening and nodularity , new since previous. Considerations include infectious or inflammatory polyradiculopathy vs tumor (CSF mets or lymphoma). The findings were reviewed with Dr. Jeralyn Ruths, who concurs. 2. Mild disc bulges L2-3 and  L3-4 without compressive pathology. 3. Right foraminal and subarticular recess narrowing L4-5 secondary to disc bulge and facet DJD. 4. Right posterolateral protrusion L5-S1 slightly displacing the right S1 nerve root. Electronically Signed   By: Lucrezia Europe M.D.   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 EXAM: CT CHEST, ABDOMEN, AND PELVIS WITH CONTRAST TECHNIQUE: Multidetector CT imaging of the chest, abdomen and pelvis was performed following the standard protocol during bolus administration of intravenous contrast. RADIATION DOSE REDUCTION: This exam was performed according to the departmental dose-optimization program which includes automated exposure control, adjustment of the mA and/or kV according to patient size and/or use of iterative reconstruction technique. CONTRAST:  66mL OMNIPAQUE IOHEXOL 350 MG/ML SOLN COMPARISON:  05/04/2021 FINDINGS: CT CHEST FINDINGS Cardiovascular: No significant vascular findings. Normal heart size. No pericardial effusion. Mediastinum/Nodes: No enlarged mediastinal,  hilar, or axillary lymph nodes. Thyroid gland, trachea, and esophagus demonstrate no significant findings. Lungs/Pleura: Lungs are clear. No pleural effusion or pneumothorax. Musculoskeletal: No chest wall mass or suspicious bone lesions identified. CT ABDOMEN PELVIS FINDINGS Hepatobiliary: No focal liver abnormality is seen. No gallstones, gallbladder wall thickening, or biliary dilatation. Pancreas: Unremarkable. No pancreatic ductal dilatation or surrounding inflammatory changes. Spleen: Normal in size without focal abnormality. Adrenals/Urinary Tract: Adrenal glands are unremarkable. Kidneys are normal, without renal calculi, focal lesion, or hydronephrosis. Bladder is unremarkable. Stomach/Bowel: Stomach is within normal limits. Right colectomy. Large amount of stool in the remainder of the colon. No evidence of bowel wall thickening, distention, or inflammatory changes. Stable 3 cm fat attenuating nodular area in the anti mesenteric aspect of the sigmoid colon in the right lower quadrant likely reflecting an area of fat necrosis or old epiploic appendagitis. Vascular/Lymphatic: No significant vascular findings are present. No enlarged abdominal or pelvic lymph nodes. Reproductive: Uterus and bilateral adnexa are unremarkable. Other: No abdominal wall hernia or abnormality. No abdominopelvic ascites. Musculoskeletal: No acute osseous abnormality. No aggressive osseous lesion. IMPRESSION: 1. No acute abnormality in the chest, abdomen or pelvis. 2. Right colectomy. Large amount of stool in the remainder of the colon. Electronically Signed   By: Kathreen Devoid M.D.   On: 02/14/2022 13:05   IR Fluoro Guide CV Line Right  Result Date: 02/11/2022 INDICATION: 61 year old female requiring central venous access for plasmapheresis in the setting of Guillain-Barre syndrome. EXAM: NON-TUNNELED CENTRAL VENOUS HEMODIALYSIS CATHETER PLACEMENT WITH ULTRASOUND AND FLUOROSCOPIC GUIDANCE COMPARISON:  None. MEDICATIONS: None  FLUOROSCOPY TIME:  0.1 minutes (0 mGy) COMPLICATIONS: None immediate. PROCEDURE: Informed written consent was obtained from the patient after a discussion of the risks, benefits, and alternatives to treatment. Questions regarding the procedure were encouraged and answered. The right neck and chest were prepped with chlorhexidine in a sterile fashion, and a sterile drape was applied covering the operative field. Maximum barrier sterile technique with sterile gowns and gloves were used for the procedure. A timeout was performed prior to the initiation of the procedure. After the overlying soft tissues were anesthetized, a small venotomy incision was created and a micropuncture kit was utilized to access the internal jugular vein. Real-time ultrasound guidance was utilized for vascular access including the acquisition of a permanent ultrasound image documenting patency of the accessed vessel. The microwire was utilized to measure appropriate catheter length. A Rosen wire was advanced to the level of the IVC. Under fluoroscopic guidance, the venotomy  was serially dilated, ultimately allowing placement of a 20 cm temporary Trialysis catheter with tip ultimately terminating within the superior aspect of the right atrium. Final catheter positioning was confirmed and documented with a spot radiographic image. The catheter aspirates and flushes normally. The catheter was flushed with appropriate volume heparin dwells. The catheter exit site was secured with a 0 silk retention suture. A dressing was placed. The patient tolerated the procedure well without immediate post procedural complication. IMPRESSION: Successful placement of a right internal jugular approach 20 cm temporary dialysis catheter with tip terminating with in the superior aspect of the right atrium. The catheter is ready for immediate use. Ruthann Cancer, MD [facility] Electronically Signed   By: Ruthann Cancer M.D.   On: 02/11/2022 07:37   IR US Guide Vasc Access Right  Result Date: 02/11/2022 INDICATION: 61 year old female requiring central venous access for plasmapheresis in the setting of Guillain-Barre syndrome. EXAM: NON-TUNNELED CENTRAL VENOUS HEMODIALYSIS CATHETER PLACEMENT WITH ULTRASOUND AND FLUOROSCOPIC GUIDANCE COMPARISON:  None. MEDICATIONS: None FLUOROSCOPY TIME:  0.1 minutes (0 mGy) COMPLICATIONS: None immediate. PROCEDURE: Informed written consent was obtained from the patient after a discussion of the risks, benefits, and alternatives to treatment. Questions regarding the procedure were encouraged and answered. The right neck and chest were prepped with chlorhexidine in a sterile fashion, and a sterile drape was applied covering the operative field. Maximum barrier sterile technique with sterile gowns and gloves were used for the procedure. A timeout was performed prior to the initiation of the procedure. After the overlying soft tissues were anesthetized, a small venotomy incision was created and a micropuncture kit was utilized to access the internal jugular vein. Real-time ultrasound guidance was utilized for vascular access including the acquisition of a permanent ultrasound image documenting patency of the accessed vessel. The microwire was utilized to measure appropriate catheter length. A Rosen wire was advanced to the level of the IVC. Under fluoroscopic guidance, the venotomy was serially dilated, ultimately allowing placement of a 20 cm temporary Trialysis catheter with tip ultimately terminating within the superior aspect of the right atrium. Final catheter positioning was confirmed and documented with a spot radiographic image. The catheter aspirates and flushes normally. The catheter was flushed with appropriate volume heparin dwells. The catheter exit site was secured with a 0 silk retention suture. A dressing was placed. The patient tolerated the procedure well without immediate  post procedural complication. IMPRESSION: Successful placement of a right internal jugular approach 20 cm temporary dialysis catheter with tip terminating with in the superior aspect of the right atrium. The catheter is ready for immediate use. Ruthann Cancer, MD [facility] Electronically Signed   By: Ruthann Cancer M.D.   On: 02/11/2022 07:37   DG MYELOGRAPHY LUMBAR INJ LUMBOSACRAL  Result Date: 01/27/2022 CLINICAL DATA:  Low back pain.  No previous lumbar surgery. EXAM: LUMBAR MYELOGRAM CT LUMBAR SPINE WITH INTRATHECAL CONTRAST FLUOROSCOPY: Radiation Exposure Index (as provided by the fluoroscopic device): 23.4 mGy Kerma TECHNIQUE: The procedure, risks (including but not limited to bleeding, infection, organ damage ), benefits, and alternatives were explained to the patient. Questions regarding the procedure were encouraged and answered. The patient understands and consents to the procedure. An appropriate entry site was determined under fluoroscopy. Operator donned sterile gloves and mask. Skin site was marked, prepped with Betadine, and draped in usual sterile fashion, and infiltrated locally with 1% lidocaine. A 22 gauge spinal needle was advanced into the  thecal sac at L2 from a right parasagittal approach. Clear colorless CSF returned. 17 ml Omnipaque 180 were administered intrathecally for lumbar myelography, followed by axial CT scanning of the lumbar spine. I personally performed the lumbar puncture and administered the intrathecal contrast. I also personally supervised acquisition of the myelogram images. Coronal and sagittal reconstructions were generated from the axial scan. This exam was performed according to the departmental dose-optimization program which includes automated exposure control, adjustment of the mA and/or kV according to patient size and/or use of iterative reconstruction technique. COMPARISON:  MR 07/15/2017 and previous  FINDINGS: Numbering scheme follows that used on previous MRI, lowest open interspace assigned L5-S1. normal alignment. Negative for fracture. No dynamic instability on lateral flexion/extension. Multiple lumbar nerve roots in the thecal sac are abnormally thickened and mildly nodular, new since previous MR 07/15/2017. T12-L1: Hypoplastic ribs at T12. Mild circumferential disc bulge with anterior endplate spurring. Central canal and foramina patent. Conus terminates behind L1. L1-2: No significant disc bulge or protrusion. Central canal and foramina patent. L2-3: Minimal disc bulge. Central canal and foramina remain widely patent. L3-4: Mild circumferential disc bulge. No spinal or foraminal stenosis. L4-5: Mild broad posterior bulge. Mild foraminal encroachment right greater than left, and right subarticular recess narrowing. Early facet DJD with some thickening of ligamentum flavum. No significant spinal stenosis. L5-S1: Shallow right posterolateral and foraminal protrusion, with slight posterior displacement of the right S1 nerve root. No spinal stenosis. Early right foraminal encroachment. Visualized paraspinal soft tissues unremarkable. IMPRESSION: 1. Abnormal lumbar nerve root thickening and nodularity , new since previous. Considerations include infectious or inflammatory polyradiculopathy vs tumor (CSF mets or lymphoma). The findings were reviewed with Dr. Jeralyn Ruths, who concurs. 2. Mild disc bulges L2-3 and L3-4 without compressive pathology. 3. Right foraminal and subarticular recess narrowing L4-5 secondary to disc bulge and facet DJD. 4. Right posterolateral protrusion L5-S1 slightly displacing the right S1 nerve root. Electronically Signed   By: Lucrezia Europe M.D.   On: 01/27/2022 12:45   CT VENOGRAM HEAD  Result Date: 02/10/2022 CLINICAL DATA:  Dural venous sinus thrombosis suspected EXAM: CT HEAD WITHOUT AND WITH CONTRAST CT VENOGRAPHY OF THE HEAD TECHNIQUE: Contiguous axial images were obtained from the  base of the skull through the vertex without and with intravenous contrast. Venographic phase images of the brain were obtained following the administration of intravenous contrast. Multiplanar reformats and maximum intensity projections were generated. RADIATION DOSE REDUCTION: This exam was performed according to the departmental dose-optimization program which includes automated exposure control, adjustment of the mA and/or kV according to patient size and/or use of iterative reconstruction technique. CONTRAST:  24mL OMNIPAQUE IOHEXOL 350 MG/ML SOLN COMPARISON:  None. FINDINGS: CT HEAD Streak artifact from right cochlear implant. Findings below within this limitation. Brain: There is no acute intracranial hemorrhage, mass, mass effect, or edema. No abnormal enhancement. Gray-white differentiation is preserved. There is no extra-axial fluid collection. Ventricles and sulci are within normal limits in size and configuration. Vascular: No hyperdense vessel or unexpected calcification. Skull: Calvarium is unremarkable. Sinuses/Orbits: No acute finding. Other: There are no internal electrodes associated with cochlear implant. Prior canal wall up mastoidectomy. Nonspecific opacification of the mastoidectomy bowl extending into the middle ear cleft. Probable left ossicle implant. CTV HEAD Superior sagittal sinus, straight sinus, vein of Galen, and internal cerebral veins are patent. Left transverse and sigmoid sinuses are patent. The right transverse and sigmoid sinuses are partially obscured by artifact. Visualized portions are patent. IMPRESSION: No acute intracranial abnormality within  limitation of streak artifact from cochlear implant apparatus. Of note, there are no apparent electrodes associated with the implant. No evidence of dural sinus thrombosis within limitation of streak artifact. Electronically Signed   By: Macy Mis M.D.   On: 02/10/2022 08:46   DG FL GUIDED LUMBAR PUNCTURE  Result Date:  02/04/2022 CLINICAL DATA:  Chronic inflammatory demyelinating polyneuropathy EXAM: DIAGNOSTIC LUMBAR PUNCTURE UNDER FLUOROSCOPIC GUIDANCE FLUOROSCOPY TIME:  Radiation Exposure Index (as provided by the fluoroscopic device): 0.7 mGy Kerma PROCEDURE: Informed consent was obtained from the patient prior to the procedure, including potential complications of headache, allergy, and pain. With the patient prone, the lower back was prepped with Betadine. 1% Lidocaine was used for local anesthesia. Lumbar puncture was performed at the L3 level from a right parasagittal approach using a 20 gauge needle with return of clear colorless CSF with an opening pressure of 11 cm H2O. 15 mL ml of CSF were obtained for laboratory studies. The patient tolerated the procedure well and there were no apparent complications. Operator: Brynda Greathouse PA IMPRESSION: Technically successful lumbar puncture under fluoroscopy Electronically Signed   By: Lucrezia Europe M.D.   On: 02/04/2022 14:09    Microbiology: Results for orders placed or performed during the hospital encounter of 02/09/22  Resp Panel by RT-PCR (Flu A&B, Covid) Nasopharyngeal Swab     Status: None   Collection Time: 02/09/22 11:04 AM   Specimen: Nasopharyngeal Swab; Nasopharyngeal(NP) swabs in vial transport medium  Result Value Ref Range Status   SARS Coronavirus 2 by RT PCR NEGATIVE NEGATIVE Final    Comment: (NOTE) SARS-CoV-2 target nucleic acids are NOT DETECTED.  The SARS-CoV-2 RNA is generally detectable in upper respiratory specimens during the acute phase of infection. The lowest concentration of SARS-CoV-2 viral copies this assay can detect is 138 copies/mL. A negative result does not preclude SARS-Cov-2 infection and should not be used as the sole basis for treatment or other patient management decisions. A negative result may occur with  improper specimen collection/handling, submission of specimen other than nasopharyngeal swab, presence of viral  mutation(s) within the areas targeted by this assay, and inadequate number of viral copies(<138 copies/mL). A negative result must be combined with clinical observations, patient history, and epidemiological information. The expected result is Negative.  Fact Sheet for Patients:  EntrepreneurPulse.com.au  Fact Sheet for Healthcare Providers:  IncredibleEmployment.be  This test is no t yet approved or cleared by the [place] FDA and  has been authorized for detection and/or diagnosis of SARS-CoV-2 by FDA under an Emergency Use Authorization (EUA). This EUA will remain  in effect (meaning this test can be used) for the duration of the COVID-19 declaration under Section 564(b)(1) of the Act, 21 U.S.C.section 360bbb-3(b)(1), unless the authorization is terminated  or revoked sooner.       Influenza A by PCR NEGATIVE NEGATIVE Final   Influenza B by PCR NEGATIVE NEGATIVE Final    Comment: (NOTE) The Xpert Xpress SARS-CoV-2/FLU/RSV plus assay is intended as an aid in the diagnosis of influenza from Nasopharyngeal swab specimens and should not be used as a sole basis for treatment. Nasal washings and aspirates are unacceptable for Xpert Xpress SARS-CoV-2/FLU/RSV testing.  Fact Sheet for Patients: EntrepreneurPulse.com.au  Fact Sheet for Healthcare Providers: IncredibleEmployment.be  This test is not yet approved or cleared by the [place] FDA and has been authorized for detection and/or diagnosis of SARS-CoV-2 by FDA under an Emergency Use Authorization (EUA). This EUA will remain in effect (meaning this  test can be used) for the duration of the COVID-19 declaration under Section 564(b)(1) of the Act, 21 U.S.C. section 360bbb-3(b)(1), unless the authorization is terminated or revoked.  Performed at [facility], [place] [address], [place], [place] [address]   Urine Culture      Status: Abnormal (Preliminary result)   Collection Time: 02/17/22 11:56 PM   Specimen: Urine, Clean Catch  Result Value Ref Range Status   Specimen Description URINE, CLEAN CATCH  Final   Special Requests NONE  Final   Culture (A)  Final    >=100,000 COLONIES/mL ESCHERICHIA COLI SUSCEPTIBILITIES TO FOLLOW Performed at [place] [address], [place], [place] [address]    Report Status PENDING  Incomplete  Culture, blood (routine x 2)     Status: None (Preliminary result)   Collection Time: 02/18/22  4:53 AM   Specimen: BLOOD RIGHT HAND  Result Value Ref Range Status   Specimen Description BLOOD RIGHT HAND  Final   Special Requests IN PEDIATRIC BOTTLE Blood Culture adequate volume  Final   Culture   Final    NO GROWTH < 12 HOURS Performed at [facility], [place] [address] [place], [place] [address]    Report Status PENDING  Incomplete  Culture, blood (routine x 2)     Status: None (Preliminary result)   Collection Time: 02/18/22  4:53 AM   Specimen: BLOOD LEFT HAND  Result Value Ref Range Status   Specimen Description BLOOD LEFT HAND  Final   Special Requests   Final    AEROBIC BOTTLE ONLY Blood Culture results may not be optimal due to an inadequate volume of blood received in culture bottles   Culture   Final    NO GROWTH < 12 HOURS Performed at [facility], [place] [address], [place],  [address]    Report Status PENDING  Incomplete    Labs: CBC: Recent Labs  Lab 02/15/22 0655 02/17/22 0735 02/18/22 0453 02/19/22 0923  WBC 5.6 6.6 5.7 6.4  NEUTROABS  --   --  3.2  --   HGB 11.3* 10.7* 10.3* 11.3*  HCT 33.3* 32.3* 30.8* 33.2*  MCV 97.4 97.0 98.4 97.9  PLT 84* 87* 77* 025*   Basic Metabolic Panel: Recent Labs  Lab 02/13/22 1235 02/15/22 0655 02/17/22 0735 02/18/22 0453  NA 139 139 137 136  K 4.2 4.2 4.1 4.4  CL 103 104 104 108  CO2 $Re'27 30 26 'mxH$ 21*  GLUCOSE 99 107* 104* 106*  BUN $Re'9 8 8 'UkG$ <5*  CREATININE 0.61 0.63 0.76 0.73   CALCIUM 9.4 8.7* 8.6* 8.5*   Liver Function Tests: Recent Labs  Lab 02/18/22 0453  AST 15  ALT 8  ALKPHOS 11*  BILITOT 0.8  PROT 4.5*  ALBUMIN 3.6   CBG: No results for input(s): GLUCAP in the last 168 hours.  Discharge time spent: greater than 30 minutes.  Signed: Estill Cotta, MD [facility] 02/19/2022

## 2022-02-20 ENCOUNTER — Encounter: Payer: Self-pay | Admitting: Neurology

## 2022-02-20 LAB — URINE CULTURE: Culture: >=100000 — AB

## 2022-02-21 ENCOUNTER — Other Ambulatory Visit: Payer: Self-pay | Admitting: Orthopedic Surgery

## 2022-02-21 LAB — URINE CULTURE: Culture: >=100000 — AB

## 2022-02-22 ENCOUNTER — Other Ambulatory Visit (HOSPITAL_COMMUNITY): Payer: Self-pay

## 2022-02-22 LAB — ANTI-MYELIN ASSOC GLYCOP IGG

## 2022-02-22 NOTE — Patient Outreach (Addendum)
Received a hospital discharge notification for Christine Brady a [place] patient.  ?I have sent a referral to [facility] who is [place] [facility] group to call for follow up and determine if there are any Case Management needs.  ?  ? ?[place] [facility], CBCS, CMAA ?[place] Management Assistant ?[facility] ?[phone]  ?

## 2022-02-22 NOTE — Telephone Encounter (Signed)
Spoke with the patient and got her scheduled to see Dr. Jaynee Eagles at 1:00 PM tomorrow March 14, arrival 1245. Pt stated she could not get IVIG because she has a blood clot. They did plasmapheresis instead and she feels it may be helping. She understands to see dentistry about her teeth pain.  ?

## 2022-02-23 ENCOUNTER — Ambulatory Visit (INDEPENDENT_AMBULATORY_CARE_PROVIDER_SITE_OTHER): Payer: 59 | Admitting: Neurology

## 2022-02-23 ENCOUNTER — Encounter: Payer: Self-pay | Admitting: Neurology

## 2022-02-23 ENCOUNTER — Other Ambulatory Visit (HOSPITAL_COMMUNITY): Payer: Self-pay

## 2022-02-23 VITALS — BP 118/66 | HR 66 | Ht 62.0 in | Wt 142.2 lb

## 2022-02-23 DIAGNOSIS — G6181 Chronic inflammatory demyelinating polyneuritis: Secondary | ICD-10-CM

## 2022-02-23 DIAGNOSIS — R270 Ataxia, unspecified: Secondary | ICD-10-CM

## 2022-02-23 DIAGNOSIS — G610 Guillain-Barre syndrome: Secondary | ICD-10-CM | POA: Diagnosis not present

## 2022-02-23 DIAGNOSIS — R269 Unspecified abnormalities of gait and mobility: Secondary | ICD-10-CM

## 2022-02-23 LAB — CULTURE, BLOOD (ROUTINE X 2)
Culture: NO GROWTH
Culture: NO GROWTH
Special Requests: ADEQUATE

## 2022-02-24 ENCOUNTER — Other Ambulatory Visit (HOSPITAL_COMMUNITY): Payer: Self-pay

## 2022-02-25 ENCOUNTER — Other Ambulatory Visit: Payer: Self-pay

## 2022-02-25 ENCOUNTER — Encounter: Payer: Self-pay | Admitting: Physical Therapy

## 2022-02-25 ENCOUNTER — Ambulatory Visit: Payer: 59 | Attending: Neurology | Admitting: Physical Therapy

## 2022-02-25 DIAGNOSIS — R270 Ataxia, unspecified: Secondary | ICD-10-CM | POA: Diagnosis not present

## 2022-02-25 DIAGNOSIS — R262 Difficulty in walking, not elsewhere classified: Secondary | ICD-10-CM | POA: Diagnosis not present

## 2022-02-25 DIAGNOSIS — R2681 Unsteadiness on feet: Secondary | ICD-10-CM | POA: Diagnosis not present

## 2022-02-25 DIAGNOSIS — G6181 Chronic inflammatory demyelinating polyneuritis: Secondary | ICD-10-CM | POA: Diagnosis not present

## 2022-02-25 DIAGNOSIS — M25661 Stiffness of right knee, not elsewhere classified: Secondary | ICD-10-CM | POA: Insufficient documentation

## 2022-02-25 DIAGNOSIS — G610 Guillain-Barre syndrome: Secondary | ICD-10-CM | POA: Insufficient documentation

## 2022-02-25 DIAGNOSIS — M6281 Muscle weakness (generalized): Secondary | ICD-10-CM | POA: Diagnosis not present

## 2022-02-25 DIAGNOSIS — R269 Unspecified abnormalities of gait and mobility: Secondary | ICD-10-CM | POA: Insufficient documentation

## 2022-02-25 NOTE — Therapy (Signed)
?OUTPATIENT PHYSICAL THERAPY NEURO EVALUATION ? ? ?Patient Name: Christine Brady ?MRN: [id number] ?DOB:[birth year], 61 y.o., female ?Today's Date: 02/26/2022 ? ?PCP: Ronita Hipps, MD ?REFERRING PROVIDER: Melvenia Beam, MD ? ? PT End of Session - 02/26/22 0826   ? ? Visit Number 1   ? Number of Visits 17   ? Date for PT Re-Evaluation 05/27/22   ? Authorization Type [employer] Employee   ? PT Start Time 1151   ? PT Stop Time 1236   ? PT Time Calculation (min) 45 min   ? Equipment Utilized During Treatment Left knee immobilizer   ? Activity Tolerance Patient tolerated treatment well   ? Behavior During Therapy [facility] for tasks assessed/performed   ? ?  ?  ? ?  ? ? ?Past Medical History:  ?Diagnosis Date  ? Abdominal pain   ? Arthritis   ? difficulty walking  ? Atypical chest pain 11/11/2016  ? Bowel obstruction ([place])   ? Cholesteatoma of right ear   ? Chronic headaches   ? 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 Cyst   ? back  ? Hearing aid worn   ? History of ITP   ? Hyperlipidemia 11/11/2016  ? Large ovary 07/09/2019  ? PONV (postoperative nausea and vomiting)   ? Reflux   ? ?Past Surgical History:  ?Procedure Laterality Date  ? CHOLECYSTECTOMY  2010  ? ESOPHAGOGASTRODUODENOSCOPY (EGD) WITH PROPOFOL N/A 06/12/2019  ? Procedure: ESOPHAGOGASTRODUODENOSCOPY (EGD) WITH PROPOFOL;  Surgeon: Carol Ada, MD;  Location: [facility];  Service: Endoscopy;  Laterality: N/A;  ? EXTERNAL EAR SURGERY  2009/2010  ? EXTERNAL EAR SURGERY  2010  ? right ear  ? IR FLUORO GUIDE CV LINE RIGHT  02/10/2022  ? IR US GUIDE VASC ACCESS RIGHT  02/10/2022  ? KNEE ARTHROSCOPY    ? right  ? KNEE ARTHROSCOPY WITH MEDIAL PATELLAR FEMORAL LIGAMENT RECONSTRUCTION Right 10/05/2021  ? Procedure: right knee arthroscoy, debridement, lateral menisectomy, medial patellar femoral ligament reconstruction;  Surgeon: Meredith Pel, MD;  Location: [facility];  Service: Orthopedics;  Laterality: Right;  ? KNEE LIGAMENT  RECONSTRUCTION    ? left  ? MASTOIDECTOMY Right 01/19/2017  ? Procedure: RIGHT MODIFIED RADICAL MASTOIDECTOMY;  Surgeon: Vicie Mutters, MD;  Location: [facility];  Service: ENT;  Laterality: Right;  ? UPPER ESOPHAGEAL ENDOSCOPIC ULTRASOUND (EUS) N/A 06/12/2019  ? Procedure: UPPER ESOPHAGEAL ENDOSCOPIC ULTRASOUND (EUS);  Surgeon: Carol Ada, MD;  Location: [facility];  Service: Endoscopy;  Laterality: N/A;  ? WISDOM TOOTH EXTRACTION    ? ?Patient Active Problem List  ? Diagnosis Date Noted  ? UTI (urinary tract infection) 02/19/2022  ? Acute inflammatory demyelinating polyneuropathy ([place]) 02/09/2022  ? DVT (deep venous thrombosis) ([place]) 02/09/2022  ? Deep venous thrombosis of lower extremity ([place]) 12/14/2021  ? Patellar instability of right knee   ? Complex tear of lateral meniscus of right knee   ? Loose body in knee, right knee   ? Constipation 06/05/2021  ? 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 Family history of other diseases of the digestive system 06/05/2021  ? Nausea 06/05/2021  ? Otalgia, left 06/05/2021  ? Right upper quadrant pain 06/05/2021  ? Acute lateral meniscus tear of right knee 06/05/2021  ? Patellar dislocation, right, sequela 06/05/2021  ? 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 History of right mastoidectomy 04/10/2020  ? Postop check 02/29/2020  ? Bowel obstruction ([place]) 09/28/2019  ? Idiopathic thrombocytopenia ([place]) 09/28/2019  ?  Eustachian tube dysfunction, bilateral 09/28/2019  ? Tinnitus aurium, bilateral 09/28/2019  ? 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 Unspecified cholesteatoma, left ear 09/28/2019  ? IBS (irritable bowel syndrome) 07/09/2019  ? Headache 07/09/2019  ? GERD (gastroesophageal reflux disease) 07/09/2019  ? 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 Menopausal syndrome 03/13/2018  ? History of postoperative nausea 01/19/2017  ?  Atypical chest pain 11/11/2016  ? 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 Epidermoid cyst on back 10/28/2011  ? ? ?ONSET DATE: 02/23/2022  ? 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 ?THERAPY DIAG:  ?Muscle weakness (generalized) ? ?Difficulty in walking, not elsewhere classified ? ?Stiffness of right knee, not elsewhere classified ? ?Unsteadiness on feet ? ?SUBJECTIVE:  ?                                                                                                                                                                                           ? 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 5 days later went to the ER due to incr headaches and incr numbness. Had 5 rounds of plasmapheresis (could not get IVIG because she has a blood clot - from Dec 2022).  Went home from hospital on 02/19/22.  Feet are still numb and that is one of the biggest issues with her balance, but leg numbness. Can't feel herself wipe with toileting due to lack of sensation around waist line. Prior to being diagnosed with GBS - started having foot/BLE numbness at end of last year. Ambulating with a cane. R side feels weaker (due to R knee surgery) and lack of R knee flexion on R side, but extension is good.  Pt was previously working with orthopedic PT due to R knee surgery but had some set backs (had a DVT, diagnosed with GBS). Wants to keep working on R knee flexion. ? ? ? ?Pt accompanied by: self ? ?PERTINENT HISTORY: GBS, knee arthroscopy with lateral menisectomy 10/05/2021, HLD, cochlear implant, chronic headaches.  ? ?PAIN:  ?Are you having pain? No ? ?PRECAUTIONS: Fall ? ?FALLS: Has patient fallen in last 6 months? No Pt reports having close calls, but able to use her cane.  ? ?LIVING  ENVIRONMENT: ?Lives with: lives alone ?Lives in: House/apartment ?Stairs: Yes; External: 2 steps; none uses cane or grabs onto post for balance.  ?Has following equipment at home:  Single point cane and Walker - 2 wheeled does not use RW  ? ?PLOF: Independent ? ?PATIENT GOALS Wants to get strength back, get rid of cane.  ? ?Hobbies: loves to work outside  ? 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  ? ?OBJECTIVE:  ? ? ?COGNITION: ?Overall cognitive status: Within functional limits for tasks assessed ?  ?SENSATION: ?Light touch: Impaired  more distally bilat towards ankles/feet ?Proprioception: WNL at ankles  ? ?COORDINATION: ?WFL heel to shin  ? ? ?LE ROM:    ? ?R knee flexion: 115 deg AROM, 120 deg PROM (pt reporting incr pain at this point) ? ?Lack of R ankle DF against gravity due to weakness  ? ?MMT:   ? ?MMT Right ?02/26/2022 Left ?02/26/2022  ?Hip flexion 4 4+  ?Hip extension    ?Hip abduction    ?Hip adduction    ?Hip internal rotation    ?Hip external rotation    ?Knee flexion 4 5  ?Knee extension 4+ 5  ?Ankle dorsiflexion 3- 4+  ?Ankle plantarflexion    ?Ankle inversion    ?Ankle eversion    ?(Blank rows = not tested) ? ? ?TRANSFERS: ?Assistive device utilized: None  ?Sit to stand: SBA ?Stand to sit: SBA ?LLE slightly staggered behind R, able to perform with no UE support  ? ? ?GAIT: ?Gait pattern: step through pattern, decreased step length- Left, decreased stance time- Right, decreased stride length, decreased hip/knee flexion- Right, decreased ankle dorsiflexion- Right, ataxic, antalgic, decreased trunk rotation, and wide BOS ?Distance walked: Clinic distances during eval. ?Assistive device utilized: Single point cane and None ?Level of assistance: SBA and CGA ?Comments: SBA with SPC, CGA with no AD. Pt has a non-adjustable cane that is too high for her and pt reports it is causing incr shoulder pain. Pt is working to get it adjusted to proper height.  ? ? ?FUNCTIONAL  TESTs:  ?5 times sit to stand: 12 sec no UE support ?30 seconds chair stand test: 12 sit <> stands no UE support ?Timed up and go (TUG): 16.28 seconds no AD ?Gait speed: 15.56 seconds = 2.11 ft/sec with SPC ?BERG: 55/56 ?mC

## 2022-02-25 NOTE — Progress Notes (Signed)
?[facility] ? ? ? ?Provider:  Dr Jaynee Eagles ?Requesting Provider: Ronita Hipps, MD ?Primary Care Provider:  Ronita Hipps, MD ? ?CC:  numbness in the legs, progressive ? 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  We discussed options, patient started having symptoms after shingles shot, unclear if at this point with diagnoses AIDP or CIDP.  If AIDP, 5 days of Plex feeling better, she may not need ongoing treatment.  If CIDP, we discussed options such as pulsed dosing with steroids or IVIG.  She was concerned with IVIG due to her clotting risk, she is on Eliquis, I advised her to discuss with her hematologist.  At this point we discussed whether or not to start treatment, we decided that we will arrange for outpatient therapy, and watch for decline, if patient continues to improve we will treat, if patient starts to decline we will have to discuss options again. ? ?Patient complains of symptoms per HPI as well as the following symptoms: sensory loss, weakness . Pertinent negatives and positives per HPI. All others negative ? ? ?HPI:  Christine Brady is a 61 y.o. female here as requested by Ronita Hipps, MD for neuropathy/numbness and tingling. PMHx ITP, chronic headaches, HLD, IBS, Gerd. 3 weeks after surgery 10/05/2021 she was in a brace on her right leg and it straight, started in both feet, acute, sudden,total bottom of th feet, no back pain, she got up one mornng and her feet felt funny, she was still inte cast but both were symmetrical. Feels like she has socks in her shoes. Started progressing in February and since has Progressed up to her waistline when she wipes she feels numb. Peroneal are feels numb.  She has urinary retention. She developed a UTI and never had that before. Goes up to the lower back and the pain runs down the buttocks. She has weakness in the legs. Progressively worse last few days, falls, using a cane.  ? ?Reviewed notes, labs and imaging from outside physicians, which showed: ? ?CT lumbar spine shows: IMPRESSION: ?1. Abnormal lumbar nerve root thickening and nodularity , new since ?previous. Considerations include infectious or inflammatory ?polyradiculopathy vs tumor (CSF mets or lymphoma). The findings were ?reviewed with Dr. Jeralyn Ruths, who concurs. ?2. Mild disc bulges L2-3 and L3-4 without compressive pathology. ?3. Right foraminal and subarticular recess narrowing L4-5 secondary ?to disc bulge and facet DJD. ?4. Right posterolateral protrusion L5-S1 slightly displacing the ?right S1 nerve root. ? ?Review of Systems: ?Patient complains of symptoms per HPI as well as the following symptoms falls. Pertinent negatives and positives per HPI. All others negative. ? ? ?Social History  ? ?Socioeconomic History  ? Marital status: Single  ?  Spouse name: Not on file  ? Number of children: Not on file  ? Years of education: Not on file  ? Highest education level: Not on file  ?Occupational History  ? Not on file  ?Tobacco Use  ? Smoking status: Never  ? Smokeless tobacco: Never  ?Vaping Use  ? Vaping Use: Never used  ?Substance and Sexual Activity  ? Alcohol use: Yes  ?  Comment: occas  ? Drug use: No  ? Sexual activity: Not on file  ?Other  Topics Concern  ? Not on file  ?Social History Narrative  ? Not on file  ? ?Social Determinants of Health  ? ?Financial Resource Strain: Not on file  ?Food Insecurity: Not on file  ?Transportation Needs: Not on file  ?Physical Activity: Not on file  ?Stress: Not on file  ?Social Connections: Not on file  ?Intimate Partner Violence: Not on file  ? ? ?Family History  ?Problem Relation Age of Onset  ? Diabetes Mother   ? Asthma Mother   ? Diabetes type II Mother   ?  Alcohol abuse Mother   ? Hypertension Mother   ? Diabetes Father   ? Hypertension Father   ? Kidney disease Father   ? Diabetes type II Father   ? Alcohol abuse Father   ? Crohn's disease Sister   ? COPD Sister   ? Thrombocytopenia Sister   ? Cerebral palsy Brother   ? Neuropathy Neg Hx   ? ? ?Past Medical History:  ?Diagnosis Date  ? Abdominal pain   ? Arthritis   ? difficulty walking  ? Atypical chest pain 11/11/2016  ? Bowel obstruction ([place])   ? Cholesteatoma of right ear   ? Chronic headaches   ? 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 Cyst   ? back  ? Hearing aid worn   ? History of ITP   ? Hyperlipidemia 11/11/2016  ? Large ovary 07/09/2019  ? PONV (postoperative nausea and vomiting)   ? Reflux   ? ? ?Patient Active Problem List  ? Diagnosis Date Noted  ? UTI (urinary tract infection) 02/19/2022  ? 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 DVT (deep venous thrombosis) ([place]) 02/09/2022  ? Deep venous thrombosis of lower extremity ([place]) 12/14/2021  ? Patellar instability of right knee   ? Complex tear of lateral meniscus of right knee   ? Loose body in knee, right knee   ? Constipation 06/05/2021  ? 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 Family history of other diseases of the digestive system 06/05/2021  ? Nausea 06/05/2021  ? Otalgia, left 06/05/2021  ? Right upper quadrant pain 06/05/2021  ? Acute lateral meniscus tear of right knee 06/05/2021  ? Patellar dislocation, right, sequela 06/05/2021  ? 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 History of right mastoidectomy 04/10/2020  ? Postop check 02/29/2020  ? Bowel obstruction ([place]) 09/28/2019  ? Idiopathic thrombocytopenia ([place]) 09/28/2019  ? Eustachian tube dysfunction, bilateral 09/28/2019  ? Tinnitus aurium, bilateral 09/28/2019  ? 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 Unspecified cholesteatoma, left ear 09/28/2019  ? IBS (irritable bowel syndrome) 07/09/2019  ? Headache 07/09/2019  ? GERD  (gastroesophageal reflux disease) 07/09/2019  ? 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 Menopausal syndrome 03/13/2018  ? History of postoperative nausea 01/19/2017  ? Atypical chest pain 11/11/2016  ? 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 Epidermoid cyst on back 10/28/2011  ? ? ?Past Surgical History:  ?Procedure Laterality Date  ? CHOLECYSTECTOMY  2010  ? ESOPHAGOGASTRODUODENOSCOPY (EGD) WITH PROPOFOL N/A 06/12/2019  ? Procedure: ESOPHAGOGASTRODUODENOSCOPY (EGD) WITH PROPOFOL;  Surgeon: Carol Ada, MD;  Location: [facility];  Service: Endoscopy;  Laterality: N/A;  ? EXTERNAL EAR SURGERY  2009/2010  ? EXTERNAL EAR SURGERY  2010  ? right ear  ? IR FLUORO GUIDE CV LINE RIGHT  02/10/2022  ? IR US GUIDE VASC ACCESS  RIGHT  02/10/2022  ? KNEE ARTHROSCOPY    ? right  ? KNEE ARTHROSCOPY WITH MEDIAL PATELLAR FEMORAL LIGAMENT RECONSTRUCTION Right 10/05/2021  ? Procedure: right knee arthroscoy, debridement, lateral menisectomy, medial patellar femoral ligament reconstruction;  Surgeon: Meredith Pel, MD;  Location: [place];  Service: Orthopedics;  Laterality: Right;  ? KNEE LIGAMENT RECONSTRUCTION    ? left  ? MASTOIDECTOMY Right 01/19/2017  ? Procedure: RIGHT MODIFIED RADICAL MASTOIDECTOMY;  Surgeon: Vicie Mutters, MD;  Location: [place];  Service: ENT;  Laterality: Right;  ? UPPER ESOPHAGEAL ENDOSCOPIC ULTRASOUND (EUS) N/A 06/12/2019  ? Procedure: UPPER ESOPHAGEAL ENDOSCOPIC ULTRASOUND (EUS);  Surgeon: Carol Ada, MD;  Location: [facility];  Service: Endoscopy;  Laterality: N/A;  ? WISDOM TOOTH EXTRACTION    ? ? ?Current Outpatient Medications  ?Medication Sig Dispense Refill  ? apixaban (ELIQUIS) 5 MG TABS tablet Take 2 tablet by mouth 2 times a day for 7 days then 1 tablet by mouth 2 times a day (Patient taking differently: Take 5 mg by mouth in the  morning and at bedtime.) 70 tablet 2  ? Calcium Carbonate-Vitamin D3 600-400 MG-UNIT TABS Take 2 tablets by mouth daily.    ? celecoxib (CELEBREX) 100 MG capsule Take 1 capsule (100 mg total) by mouth 2 (t

## 2022-02-27 LAB — MISC LABCORP TEST (SEND OUT): Labcorp test code: 9985

## 2022-03-01 ENCOUNTER — Ambulatory Visit: Payer: 59 | Admitting: Neurology

## 2022-03-02 ENCOUNTER — Other Ambulatory Visit: Payer: Self-pay

## 2022-03-02 ENCOUNTER — Ambulatory Visit: Payer: 59 | Admitting: Physical Therapy

## 2022-03-02 ENCOUNTER — Telehealth: Payer: Self-pay | Admitting: Neurology

## 2022-03-02 ENCOUNTER — Encounter: Payer: Self-pay | Admitting: Physical Therapy

## 2022-03-02 DIAGNOSIS — G6181 Chronic inflammatory demyelinating polyneuritis: Secondary | ICD-10-CM | POA: Diagnosis not present

## 2022-03-02 DIAGNOSIS — R269 Unspecified abnormalities of gait and mobility: Secondary | ICD-10-CM | POA: Diagnosis not present

## 2022-03-02 DIAGNOSIS — G610 Guillain-Barre syndrome: Secondary | ICD-10-CM | POA: Diagnosis not present

## 2022-03-02 DIAGNOSIS — R262 Difficulty in walking, not elsewhere classified: Secondary | ICD-10-CM

## 2022-03-02 DIAGNOSIS — M6281 Muscle weakness (generalized): Secondary | ICD-10-CM

## 2022-03-02 DIAGNOSIS — R2681 Unsteadiness on feet: Secondary | ICD-10-CM | POA: Diagnosis not present

## 2022-03-02 DIAGNOSIS — M25661 Stiffness of right knee, not elsewhere classified: Secondary | ICD-10-CM | POA: Diagnosis not present

## 2022-03-02 DIAGNOSIS — R270 Ataxia, unspecified: Secondary | ICD-10-CM | POA: Diagnosis not present

## 2022-03-02 NOTE — Telephone Encounter (Signed)
Referral for NCV/EMG sent to [facility] [phone]. ?

## 2022-03-02 NOTE — Patient Instructions (Signed)
Access Code: [email or web address] ?URL: [email or web address] ?Date: 03/02/2022 ?Prepared by: Elease Etienne ? ?Exercises ?Seated Hamstring Curls with Resistance - 1 x daily - 5 x weekly - 2 sets - 10 reps - 3 seconds hold ?Staggered Sit-to-Stand - 1 x daily - 5 x weekly - 2 sets - 12 reps ?Narrow Stance with Eyes Closed on Foam Pad - 1 x daily - 5 x weekly - 1 sets - 2 reps - 30 seconds hold ?Tandem Stance with Eyes Closed - 1 x daily - 5 x weekly - 1 sets - 2 reps - 30 seconds hold ? ?

## 2022-03-02 NOTE — Therapy (Signed)
?OUTPATIENT PHYSICAL THERAPY TREATMENT NOTE ? ? ?Patient Name: Christine Brady ?MRN: [id number] ?DOB:1961/12/12, 61 y.o., female ?Today's Date: 03/03/2022 ? ?PCP: Ronita Hipps, MD ?REFERRING PROVIDER: Ronita Hipps, MD ? ? PT End of Session - 03/02/22 0934   ? ? Visit Number 2   ? Number of Visits 17   ? Date for PT Re-Evaluation 05/27/22   ? Authorization Type [employer]   ? PT Start Time 0930   ? PT Stop Time 1015   ? PT Time Calculation (min) 45 min   ? Equipment Utilized During Treatment Gait belt   ? Activity Tolerance Patient tolerated treatment well   ? Behavior During Therapy [facility] for tasks assessed/performed   ? ?  ?  ? ?  ? ? ?Past Medical History:  ?Diagnosis Date  ? Abdominal pain   ? Arthritis   ? difficulty walking  ? Atypical chest pain 11/11/2016  ? Bowel obstruction ([place])   ? Cholesteatoma of right ear   ? Chronic headaches   ? 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 Cyst   ? back  ? Hearing aid worn   ? History of ITP   ? Hyperlipidemia 11/11/2016  ? Large ovary 07/09/2019  ? PONV (postoperative nausea and vomiting)   ? Reflux   ? ?Past Surgical History:  ?Procedure Laterality Date  ? CHOLECYSTECTOMY  2010  ? ESOPHAGOGASTRODUODENOSCOPY (EGD) WITH PROPOFOL N/A 06/12/2019  ? Procedure: ESOPHAGOGASTRODUODENOSCOPY (EGD) WITH PROPOFOL;  Surgeon: Carol Ada, MD;  Location: [facility];  Service: Endoscopy;  Laterality: N/A;  ? EXTERNAL EAR SURGERY  2009/2010  ? EXTERNAL EAR SURGERY  2010  ? right ear  ? IR FLUORO GUIDE CV LINE RIGHT  02/10/2022  ? IR US GUIDE VASC ACCESS RIGHT  02/10/2022  ? KNEE ARTHROSCOPY    ? right  ? KNEE ARTHROSCOPY WITH MEDIAL PATELLAR FEMORAL LIGAMENT RECONSTRUCTION Right 10/05/2021  ? Procedure: right knee arthroscoy, debridement, lateral menisectomy, medial patellar femoral ligament reconstruction;  Surgeon: Meredith Pel, MD;  Location: [place];  Service: Orthopedics;  Laterality: Right;  ? KNEE LIGAMENT RECONSTRUCTION     ? left  ? MASTOIDECTOMY Right 01/19/2017  ? Procedure: RIGHT MODIFIED RADICAL MASTOIDECTOMY;  Surgeon: Vicie Mutters, MD;  Location: [place];  Service: ENT;  Laterality: Right;  ? UPPER ESOPHAGEAL ENDOSCOPIC ULTRASOUND (EUS) N/A 06/12/2019  ? Procedure: UPPER ESOPHAGEAL ENDOSCOPIC ULTRASOUND (EUS);  Surgeon: Carol Ada, MD;  Location: [facility];  Service: Endoscopy;  Laterality: N/A;  ? WISDOM TOOTH EXTRACTION    ? ?Patient Active Problem List  ? Diagnosis Date Noted  ? UTI (urinary tract infection) 02/19/2022  ? 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 DVT (deep venous thrombosis) ([employer]) 02/09/2022  ? Deep venous thrombosis of lower extremity ([place]) 12/14/2021  ? Patellar instability of right knee   ? Complex tear of lateral meniscus of right knee   ? Loose body in knee, right knee   ? Constipation 06/05/2021  ? 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 Family history of other diseases of the digestive system 06/05/2021  ? Nausea 06/05/2021  ? Otalgia, left 06/05/2021  ? Right upper quadrant pain 06/05/2021  ? Acute lateral meniscus tear of right knee 06/05/2021  ? Patellar dislocation, right, sequela 06/05/2021  ? 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 History of right mastoidectomy 04/10/2020  ? Postop check 02/29/2020  ? Bowel obstruction ([place]) 09/28/2019  ? Idiopathic thrombocytopenia ([place]) 09/28/2019  ?  Eustachian tube dysfunction, bilateral 09/28/2019  ? Tinnitus aurium, bilateral 09/28/2019  ? 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 Unspecified cholesteatoma, left ear 09/28/2019  ? IBS (irritable bowel syndrome) 07/09/2019  ? Headache 07/09/2019  ? GERD (gastroesophageal reflux disease) 07/09/2019  ? 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 Menopausal syndrome 03/13/2018  ? History of postoperative nausea 01/19/2017  ? Atypical chest pain  11/11/2016  ? 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 Epidermoid cyst on back 10/28/2011  ? ? 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 ?THERAPY DIAG:  ?Muscle weakness (generalized) ? ?Difficulty in walking, not elsewhere classified ? ?Unsteadiness on feet ? ?PERTINENT HISTORY: GBS, knee arthroscopy with lateral menisectomy 10/05/2021, HLD, cochlear implant, chronic headaches. ? ?PRECAUTIONS: Fall ? ?SUBJECTIVE: Pt states she has N/T on bottoms of feet, but that's it.  No new issues.  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 ? ?PAIN:  ?Are you having pain? No ? ?TODAY'S TREATMENT: ?Assessed FGA:  18/30 = high fall risk ? [facility] PT Assessment - 03/02/22 0938   ? ?  ? Functional Gait  Assessment  ? Gait assessed  Yes   ? Gait Level Surface Walks 20 ft in less than 7 sec but greater than 5.5 sec, uses assistive device, slower speed, mild gait deviations, or deviates 6-10 in outside of the 12 in walkway width.   ? Change in Gait Speed Able to change speed, demonstrates mild gait deviations, deviates 6-10 in outside of the 12 in walkway width, or no gait deviations, unable to achieve a major change in velocity, or uses a change in velocity, or uses an assistive device.   ? Gait with Horizontal Head Turns Performs head turns smoothly with no change in gait. Deviates no more than 6 in outside 12 in walkway width   ? Gait with Vertical Head Turns Performs head turns with no change in gait. Deviates no more than 6 in outside 12 in walkway width.   ? Gait and Pivot Turn Pivot turns safely in greater than 3 sec and stops with no loss of balance, or pivot turns safely within 3 sec and stops with mild imbalance, requires small steps to catch balance.   ? Step Over Obstacle Is able to step over one shoe box (4.5 in total  height) but must slow down and adjust steps to clear box safely. May require verbal cueing.   ? Gait with Narrow Base of Support Ambulates less than 4 steps heel to toe or cannot perform without assistance.   uses cane and x2 attempts for balance  ? Gait with Eyes Closed Walks 20 ft, uses assistive device, slower speed, mild gait deviations, deviates 6-10 in outside 12 in walkway width. Ambulates 20 ft in less than 9 sec but greater than 7 sec.   ? Ambulating Backwards Walks 20 ft, uses assistive device, slower speed, mild gait deviations, deviates 6-10 in outside 12 in walkway width.   ? Steps Two feet to a stair, must use rail.   ? Total Score 18   ? ?  ?  ? ?  ? ?Initiated HEP:  See below. ? ?3 trials EO/EC in tandem stance on firm surface with noted increased sway when eyes closed, pt cued to  promote even weight shift with bilateral heels on ground during task.  Pt able to hold for 3 bouts of 15 secs.  Instructed to work into 30 sec with HEP using EO as way to rebalance prior to trialing EC. ?2 trials of EC on pillows x30 sec w/ less sway than in tandem on firm. ? ?Pt stands at base of stairs using bilateral rails progressed to no UE support to perform weight shifts into slight lunge on 6" step using alternating LE to promote R knee stretch and balance. ? ? ?OBJECTIVE:  ?  ?  ?COGNITION: ?Overall cognitive status: Within functional limits for tasks assessed ?            ?SENSATION: ?Light touch: Impaired  more distally bilat towards ankles/feet ?Proprioception: WNL at ankles  ?  ?COORDINATION: ?WFL heel to shin  ?  ?  ?LE ROM:    ?  ?R knee flexion: 115 deg AROM, 120 deg PROM (pt reporting incr pain at this point) ?  ?Lack of R ankle DF against gravity due to weakness  ?  ?MMT:   ?  ?MMT Right ?02/26/2022 Left ?02/26/2022  ?Hip flexion 4 4+  ?Hip extension      ?Hip abduction      ?Hip adduction      ?Hip internal rotation      ?Hip external rotation      ?Knee flexion 4 5  ?Knee extension 4+ 5  ?Ankle  dorsiflexion 3- 4+  ?Ankle plantarflexion      ?Ankle inversion      ?Ankle eversion      ?(Blank rows = not tested) ?  ?  ?TRANSFERS: ?Assistive device utilized: None  ?Sit to stand: SBA ?Stand to sit: SBA ?LLE sli

## 2022-03-03 ENCOUNTER — Ambulatory Visit: Payer: 59 | Admitting: Physical Therapy

## 2022-03-05 ENCOUNTER — Ambulatory Visit: Payer: 59 | Admitting: Physical Therapy

## 2022-03-05 LAB — FUNGUS CULTURE W SMEAR
CULTURE:: NO GROWTH
MICRO NUMBER:: 13048451
SMEAR:: NONE SEEN
SPECIMEN QUALITY:: ADEQUATE

## 2022-03-05 LAB — CSF CULTURE W GRAM STAIN
MICRO NUMBER:: 13048452
Result:: NO GROWTH
SPECIMEN QUALITY:: ADEQUATE

## 2022-03-05 LAB — CSF CELL COUNT WITH DIFFERENTIAL
Basophils, %: 0 %
Eosinophils, CSF: 0 %
Lymphs, CSF: 95 % — ABNORMAL HIGH (ref 40–80)
Monocyte/Macrophage: 5 % — ABNORMAL LOW (ref 15–45)
RBC Count, CSF: 3 cells/uL — ABNORMAL HIGH
Segmented Neutrophils-CSF: 0 % (ref 0–6)
WBC, CSF: 18 cells/uL — ABNORMAL HIGH (ref 0–5)

## 2022-03-05 LAB — VDRL, CSF: VDRL Quant, CSF: NONREACTIVE

## 2022-03-05 LAB — PROTEIN, CSF: Total Protein, CSF: 109 mg/dL — ABNORMAL HIGH (ref 15–45)

## 2022-03-05 LAB — OLIGOCLONAL BANDS, CSF + SERM

## 2022-03-05 LAB — GLUCOSE, CSF: Glucose, CSF: 55 mg/dL (ref 40–80)

## 2022-03-10 ENCOUNTER — Other Ambulatory Visit: Payer: Self-pay

## 2022-03-10 ENCOUNTER — Encounter: Payer: Self-pay | Admitting: Physical Therapy

## 2022-03-10 ENCOUNTER — Ambulatory Visit: Payer: 59 | Admitting: Physical Therapy

## 2022-03-10 DIAGNOSIS — G6181 Chronic inflammatory demyelinating polyneuritis: Secondary | ICD-10-CM | POA: Diagnosis not present

## 2022-03-10 DIAGNOSIS — G610 Guillain-Barre syndrome: Secondary | ICD-10-CM | POA: Diagnosis not present

## 2022-03-10 DIAGNOSIS — R262 Difficulty in walking, not elsewhere classified: Secondary | ICD-10-CM | POA: Diagnosis not present

## 2022-03-10 DIAGNOSIS — M6281 Muscle weakness (generalized): Secondary | ICD-10-CM | POA: Diagnosis not present

## 2022-03-10 DIAGNOSIS — R270 Ataxia, unspecified: Secondary | ICD-10-CM | POA: Diagnosis not present

## 2022-03-10 DIAGNOSIS — M25661 Stiffness of right knee, not elsewhere classified: Secondary | ICD-10-CM

## 2022-03-10 DIAGNOSIS — R2681 Unsteadiness on feet: Secondary | ICD-10-CM | POA: Diagnosis not present

## 2022-03-10 DIAGNOSIS — R269 Unspecified abnormalities of gait and mobility: Secondary | ICD-10-CM | POA: Diagnosis not present

## 2022-03-10 NOTE — Therapy (Signed)
?OUTPATIENT PHYSICAL THERAPY TREATMENT NOTE ? ? ?Patient Name: Christine Brady ?MRN: [id number] ?DOB:[birth year], 60 y.o., female ?Today's Date: 03/10/2022 ? ?PCP: Ronita Hipps, MD ?REFERRING PROVIDER: Ronita Hipps, MD ? ? PT End of Session - 03/10/22 0805   ? ? Visit Number 3   ? Number of Visits 17   ? Date for PT Re-Evaluation 05/27/22   ? Authorization Type [employer]   ? PT Start Time [phone]   ? PT Stop Time [phone]   ? PT Time Calculation (min) 40 min   ? Equipment Utilized During Treatment Gait belt   ? Activity Tolerance Patient tolerated treatment well   ? Behavior During Therapy [facility] for tasks assessed/performed   ? ?  ?  ? ?  ? ? ?Past Medical History:  ?Diagnosis Date  ? Abdominal pain   ? Arthritis   ? difficulty walking  ? Atypical chest pain 11/11/2016  ? Bowel obstruction ([place])   ? Cholesteatoma of right ear   ? Chronic headaches   ? 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 Cyst   ? back  ? Hearing aid worn   ? History of ITP   ? Hyperlipidemia 11/11/2016  ? Large ovary 07/09/2019  ? PONV (postoperative nausea and vomiting)   ? Reflux   ? ?Past Surgical History:  ?Procedure Laterality Date  ? CHOLECYSTECTOMY  2010  ? ESOPHAGOGASTRODUODENOSCOPY (EGD) WITH PROPOFOL N/A 06/12/2019  ? Procedure: ESOPHAGOGASTRODUODENOSCOPY (EGD) WITH PROPOFOL;  Surgeon: Carol Ada, MD;  Location: [facility];  Service: Endoscopy;  Laterality: N/A;  ? EXTERNAL EAR SURGERY  2009/2010  ? EXTERNAL EAR SURGERY  2010  ? right ear  ? IR FLUORO GUIDE CV LINE RIGHT  02/10/2022  ? IR US GUIDE VASC ACCESS RIGHT  02/10/2022  ? KNEE ARTHROSCOPY    ? right  ? KNEE ARTHROSCOPY WITH MEDIAL PATELLAR FEMORAL LIGAMENT RECONSTRUCTION Right 10/05/2021  ? Procedure: right knee arthroscoy, debridement, lateral menisectomy, medial patellar femoral ligament reconstruction;  Surgeon: Meredith Pel, MD;  Location: [place];  Service: Orthopedics;  Laterality: Right;  ? KNEE LIGAMENT RECONSTRUCTION     ? left  ? MASTOIDECTOMY Right 01/19/2017  ? Procedure: RIGHT MODIFIED RADICAL MASTOIDECTOMY;  Surgeon: Vicie Mutters, MD;  Location: [place];  Service: ENT;  Laterality: Right;  ? UPPER ESOPHAGEAL ENDOSCOPIC ULTRASOUND (EUS) N/A 06/12/2019  ? Procedure: UPPER ESOPHAGEAL ENDOSCOPIC ULTRASOUND (EUS);  Surgeon: Carol Ada, MD;  [facility];  Service: Endoscopy;  Laterality: N/A;  ? WISDOM TOOTH EXTRACTION    ? ?Patient Active Problem List  ? Diagnosis Date Noted  ? UTI (urinary tract infection) 02/19/2022  ? 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 DVT (deep venous thrombosis) ([place]) 02/09/2022  ? Deep venous thrombosis of lower extremity ([place]) 12/14/2021  ? Patellar instability of right knee   ? Complex tear of lateral meniscus of right knee   ? Loose body in knee, right knee   ? Constipation 06/05/2021  ? 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 Family history of other diseases of the digestive system 06/05/2021  ? Nausea 06/05/2021  ? Otalgia, left 06/05/2021  ? Right upper quadrant pain 06/05/2021  ? Acute lateral meniscus tear of right knee 06/05/2021  ? Patellar dislocation, right, sequela 06/05/2021  ? 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 History of right mastoidectomy 04/10/2020  ? Postop check 02/29/2020  ? Bowel obstruction ([place]) 09/28/2019  ? Idiopathic thrombocytopenia ([place]) 09/28/2019  ?  Eustachian tube dysfunction, bilateral 09/28/2019  ? Tinnitus aurium, bilateral 09/28/2019  ? 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 Unspecified cholesteatoma, left ear 09/28/2019  ? IBS (irritable bowel syndrome) 07/09/2019  ? Headache 07/09/2019  ? GERD (gastroesophageal reflux disease) 07/09/2019  ? 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 Menopausal syndrome 03/13/2018  ? History of postoperative nausea 01/19/2017  ? Atypical chest pain  11/11/2016  ? 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 Epidermoid cyst on back 10/28/2011  ? ? 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 ?THERAPY DIAG:  ?Muscle weakness (generalized) ? ?Unsteadiness on feet ? ?Stiffness of right knee, not elsewhere classified ? ?PERTINENT HISTORY: GBS, knee arthroscopy with lateral menisectomy 10/05/2021, HLD, cochlear implant, chronic headaches. ? ?PRECAUTIONS: Fall ? ?SUBJECTIVE: Feels like she is getting better. Exercises are going well.  ? ?PAIN:  ?Are you having pain? No ? ?TODAY'S TREATMENT: ?SciFit with BUE/BLE for strength, ROM, activity tolerance at gear 3.0 for 2 minutes, then with BLE only at gear 3.5 for an additional 5 minutes. Pt prefers with BLE only.  ?Quad stretch using gait belt in prone - performed 2 bouts of 45 seconds, pt reporting feeling an incr stretch in quads. Added to pt's HEP. Pt has a strap that she can use at home.  ?Supine active straight leg raises with RLE x10 reps ?Step ups with RLE on 6" step - 2 sets of 10 reps, needing single UE support, incr difficulty initially, but improved with incr reps.  ?Eccentric step downs with RLE on 2" step - 2 sets of 10 reps, pt reporting slight incr in knee pain afterwards.  ?Mini squats x10 reps  with 3-5 second isometric hold - chair posteriorly to pt, cues to sit back into chair. Added to HEP.  ?LAQs with 1# ankle weight with 3 sec hold x10 reps  ? ? ?  ?  ?GAIT: ?Gait pattern: step through pattern, decreased step length- Left, decreased stance time- Right, decreased stride length, decreased hip/knee flexion- Right, decreased ankle dorsiflexion- Right, ataxic, antalgic, decreased trunk rotation, and wide BOS ?Distance walked: Clinic distances . ?Assistive device utilized: Single point cane (walking stick) ?Level of  assistance: SBA and CGA ?Comments: Pt has walking stick adjusted to proper height, ambulated between session.  ? ? ?  ?  ?  ?PATIENT EDUCATION: ?Education details: Additions to HEP.  ?Person educated: Patient ?Education method: Explanation; demonstration; handout ?Education comprehension: verbalized understanding ?  ?  ?HOME EXERCISE PROGRAM: ?Access Code: [email or web address] ?URL: [email or web address] ?Date: 03/10/2022 ?Prepared by: Janann August ? ?Exercises ?- Seated Hamstring Curls with Resistance  - 1 x daily - 5 x weekly - 2 sets - 10 reps - 3 seconds hold ?- Staggered Sit-to-Stand  - 1 x daily - 5 x weekly - 2 sets - 12 reps ?- Narrow Stance with Eyes Closed on Foam Pad  - 1 x daily - 5 x weekly - 1 sets - 2 reps - 30 seconds hold ?- Tandem Stance with Eyes Closed  - 1 x daily - 5 x weekly - 1 sets - 2 reps - 30 seconds hold ? ?New additons on 03/10/22 ?- Mini Squat with Chair  - 1 x daily - 5 x weekly - 2 sets - 10 reps - 3  hold ?- Prone Quadriceps Stretch with Strap  - 1 x daily - 5 x weekly - 3 sets - 30 hold ?  ?  ?  ?GOALS: ?Goals reviewed with patient? Yes ?  ?SHORT TERM GOALS: Target date: 03/26/2022 ?  ?Pt will be independent with initial HEP in order to build upon functional gains made in therapy. ?Baseline: no HEP yet. ?Goal status: INITIAL ?  ?2.  Pt will undergo further assessment of FGA with LTG written. ?Baseline: assessed 03/02/2022 ?Goal status: Achieved ?  ?3.  Pt will improve AROM of R knee to at least 125 deg in order to demo improved ROM for gait.  ?Baseline: 125 deg     ?Goal status: INITIAL ?  ?4.  Pt will perform TUG in 13.5 sec or less with no AD in order to demo decr fall risk.  ?Baseline: 16.28 sec ?Goal status: INITIAL ?  ?5.  Pt will improve gait speed with SPC vs. No AD to at least 2.5 ft/sec in order to demo improved community mobility.  ?  ?Baseline: 2.11 ft/sec with SPC ?Goal status: INITIAL ?  ?  ?  ?LONG TERM GOALS: Target date: 04/23/2022 ?  ?Pt will be independent with  final HEP in order to build upon functional gains made in therapy. ?Baseline: No HEP yet ?Goal status: INITIAL ?  ?2.  Pt will improve FGA score to 25/30 in order to demonstrate improved balance and decreased

## 2022-03-12 ENCOUNTER — Ambulatory Visit: Payer: 59 | Admitting: Physical Therapy

## 2022-03-12 ENCOUNTER — Encounter: Payer: Self-pay | Admitting: Physical Therapy

## 2022-03-12 DIAGNOSIS — M6281 Muscle weakness (generalized): Secondary | ICD-10-CM

## 2022-03-12 DIAGNOSIS — G6181 Chronic inflammatory demyelinating polyneuritis: Secondary | ICD-10-CM | POA: Diagnosis not present

## 2022-03-12 DIAGNOSIS — R270 Ataxia, unspecified: Secondary | ICD-10-CM | POA: Diagnosis not present

## 2022-03-12 DIAGNOSIS — M25661 Stiffness of right knee, not elsewhere classified: Secondary | ICD-10-CM

## 2022-03-12 DIAGNOSIS — G610 Guillain-Barre syndrome: Secondary | ICD-10-CM | POA: Diagnosis not present

## 2022-03-12 DIAGNOSIS — R262 Difficulty in walking, not elsewhere classified: Secondary | ICD-10-CM | POA: Diagnosis not present

## 2022-03-12 DIAGNOSIS — R269 Unspecified abnormalities of gait and mobility: Secondary | ICD-10-CM | POA: Diagnosis not present

## 2022-03-12 DIAGNOSIS — R2681 Unsteadiness on feet: Secondary | ICD-10-CM | POA: Diagnosis not present

## 2022-03-12 NOTE — Patient Instructions (Signed)
Access Code: [email or web address] ?URL: [email or web address] ?Date: 03/12/2022 ?Prepared by: Janann August ? ?Exercises ?- Seated Hamstring Curls with Resistance  - 1 x daily - 5 x weekly - 2 sets - 10 reps - 3 seconds hold ?- Staggered Sit-to-Stand  - 1 x daily - 5 x weekly - 2 sets - 12 reps ?- Narrow Stance with Eyes Closed on Foam Pad  - 1 x daily - 5 x weekly - 1 sets - 2 reps - 30 seconds hold ?- Tandem Stance with Eyes Closed  - 1 x daily - 5 x weekly - 1 sets - 2 reps - 30 seconds hold ?- Mini Squat with Chair  - 1 x daily - 5 x weekly - 2 sets - 10 reps - 3 hold ?- Prone Quadriceps Stretch with Strap  - 1 x daily - 5 x weekly - 3 sets - 30 hold ?- Seated Long Arc Quad  - 1 x daily - 5 x weekly - 2 sets - 10 reps ?

## 2022-03-12 NOTE — Therapy (Addendum)
?OUTPATIENT PHYSICAL THERAPY TREATMENT NOTE ? ? ?Patient Name: Christine Brady ?MRN: [id number] ?DOB:November 19, 1961, 61 y.o., female ?Today's Date: 03/12/2022 ? ?PCP: Ronita Hipps, MD ?REFERRING PROVIDER: Melvenia Beam, MD ? ? PT End of Session - 03/12/22 0805   ? ? Visit Number 4   ? Number of Visits 17   ? Date for PT Re-Evaluation 05/27/22   ? Authorization Type [employer]   ? PT Start Time [phone]   ? PT Stop Time 0845   ? PT Time Calculation (min) 42 min   ? Equipment Utilized During Treatment Gait belt   ? Activity Tolerance Patient tolerated treatment well   ? Behavior During Therapy [facility] for tasks assessed/performed   ? ?  ?  ? ?  ? ? ? ?Past Medical History:  ?Diagnosis Date  ? Abdominal pain   ? Arthritis   ? difficulty walking  ? Atypical chest pain 11/11/2016  ? Bowel obstruction ([place])   ? Cholesteatoma of right ear   ? Chronic headaches   ? 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 Cyst   ? back  ? Hearing aid worn   ? History of ITP   ? Hyperlipidemia 11/11/2016  ? Large ovary 07/09/2019  ? PONV (postoperative nausea and vomiting)   ? Reflux   ? ?Past Surgical History:  ?Procedure Laterality Date  ? CHOLECYSTECTOMY  2010  ? ESOPHAGOGASTRODUODENOSCOPY (EGD) WITH PROPOFOL N/A 06/12/2019  ? Procedure: ESOPHAGOGASTRODUODENOSCOPY (EGD) WITH PROPOFOL;  Surgeon: Carol Ada, MD;  [facility];  Service: Endoscopy;  Laterality: N/A;  ? EXTERNAL EAR SURGERY  2009/2010  ? EXTERNAL EAR SURGERY  2010  ? right ear  ? IR FLUORO GUIDE CV LINE RIGHT  02/10/2022  ? IR US GUIDE VASC ACCESS RIGHT  02/10/2022  ? KNEE ARTHROSCOPY    ? right  ? KNEE ARTHROSCOPY WITH MEDIAL PATELLAR FEMORAL LIGAMENT RECONSTRUCTION Right 10/05/2021  ? Procedure: right knee arthroscoy, debridement, lateral menisectomy, medial patellar femoral ligament reconstruction;  Surgeon: Meredith Pel, MD;  Location: [place];  Service: Orthopedics;  Laterality: Right;  ? KNEE LIGAMENT RECONSTRUCTION     ? left  ? MASTOIDECTOMY Right 01/19/2017  ? Procedure: RIGHT MODIFIED RADICAL MASTOIDECTOMY;  Surgeon: Vicie Mutters, MD;  Location: [place];  Service: ENT;  Laterality: Right;  ? UPPER ESOPHAGEAL ENDOSCOPIC ULTRASOUND (EUS) N/A 06/12/2019  ? Procedure: UPPER ESOPHAGEAL ENDOSCOPIC ULTRASOUND (EUS);  Surgeon: Carol Ada, MD;  Location[facility];  Service: Endoscopy;  Laterality: N/A;  ? WISDOM TOOTH EXTRACTION    ? ?Patient Active Problem List  ? Diagnosis Date Noted  ? UTI (urinary tract infection) 02/19/2022  ? 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 DVT (deep venous thrombosis) ([place]) 02/09/2022  ? Deep venous thrombosis of lower extremity ([place]) 12/14/2021  ? Patellar instability of right knee   ? Complex tear of lateral meniscus of right knee   ? Loose body in knee, right knee   ? Constipation 06/05/2021  ? 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 Family history of other diseases of the digestive system 06/05/2021  ? Nausea 06/05/2021  ? Otalgia, left 06/05/2021  ? Right upper quadrant pain 06/05/2021  ? Acute lateral meniscus tear of right knee 06/05/2021  ? Patellar dislocation, right, sequela 06/05/2021  ? 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 History of right mastoidectomy 04/10/2020  ? Postop check 02/29/2020  ? Bowel obstruction ([place]) 09/28/2019  ? Idiopathic thrombocytopenia ([place]) 09/28/2019  ?  Eustachian tube dysfunction, bilateral 09/28/2019  ? Tinnitus aurium, bilateral 09/28/2019  ? 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 Unspecified cholesteatoma, left ear 09/28/2019  ? IBS (irritable bowel syndrome) 07/09/2019  ? Headache 07/09/2019  ? GERD (gastroesophageal reflux disease) 07/09/2019  ? 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 Menopausal syndrome 03/13/2018  ? History of postoperative nausea 01/19/2017  ? Atypical chest pain  11/11/2016  ? 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 Epidermoid cyst on back 10/28/2011  ? ? 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 ?THERAPY DIAG:  ?Muscle weakness (generalized) ? ?Unsteadiness on feet ? ?Stiffness of right knee, not elsewhere classified ? ?Difficulty in walking, not elsewhere classified ? ?PERTINENT HISTORY: GBS, knee arthroscopy with lateral menisectomy 10/05/2021, HLD, cochlear implant, chronic headaches. ? ?PRECAUTIONS: Fall ? ?SUBJECTIVE: Have been doing the stairs and reports they are getting better.  ? ?PAIN:  ?Are you having pain? No ? ?TODAY'S TREATMENT: ?SciFit with BLE only for strength, ROM, activity tolerance at gear 3.5 for 6 minutes. ?Step ups with RLE on 6" step - 2 sets of 10 reps, needing single UE support. ?Lateral steps up with RLE 2 sets of 10 reps with UE support > none ?Standing hamstring curls with RLE with 1# ankle weight, cues for proper technique 2 sets of 10 reps ?Standing hip ABD with RLE 2 sets of 10 reps with 1# ankle weight, cues for proper technique ?Seated LAQs 2 sets of 10 with RLE with yellow tband - added to HEP  ? ?NMR: ?Rockerboard in A/P direction weight shifting x15 reps, with board staying still performed x10 reps head turns, x10 reps head nods in M/L direction x10 reps.  ?On blue air ex with feet together eyes closed 3 x 30 sec, mild postural sway ? ? ? ?  ?  ?GAIT: ?Gait pattern: step through pattern, decreased step length- Left, decreased stance time- Right, decreased stride length, decreased hip/knee flexion- Right, decreased ankle dorsiflexion- Right, ataxic, antalgic, decreased trunk rotation, and wide BOS ?Distance walked: Clinic distances . ?Assistive device utilized: Single point cane (walking stick) ?Level of assistance: SBA and CGA ?Comments: Pt  has walking stick adjusted to proper height, ambulated between session.  ? ? ?  ?  ?  ?PATIENT EDUCATION: ?Education details: LAQ addition to HEP with yellow tband ?Person educated: Patient ?Education method: Explanation; demonstration; handout ?Education comprehension: verbalized understanding ?  ?  ?HOME EXERCISE PROGRAM: ?Access Code: [email or web address] ? ?  ?  ?GOALS: ?Goals reviewed with patient? Yes ?  ?SHORT TERM GOALS: Target date: 03/26/2022 ?  ?Pt will be independent with initial HEP in order to build upon functional gains made in therapy. ?Baseline: no HEP yet. ?Goal status: INITIAL ?  ?2.  Pt will undergo further assessment of FGA with LTG written. ?Baseline: assessed 03/02/2022 ?Goal status: Achieved ?  ?3.  Pt will improve AROM of R knee to at least 125 deg in order to demo improved ROM for gait.  ?Baseline: 125 deg     ?Goal status: INITIAL ?  ?4.  Pt will perform TUG in 13.5 sec or less with no AD in order to demo decr fall risk.  ?Baseline: 16.28 sec ?Goal status: INITIAL ?  ?5.  Pt will improve gait speed with SPC  vs. No AD to at least 2.5 ft/sec in order to demo improved community mobility.  ?  ?Baseline: 2.11 ft/sec with SPC ?Goal status: INITIAL ?  ?  ?  ?LONG TERM GOALS: Target date: 04/23/2022 ?  ?Pt will be independent with final HEP in order to build upon functional gains made in therapy. ?Baseline: No HEP yet ?Goal status: INITIAL ?  ?2.  Pt will improve FGA score to 25/30 in order to demonstrate improved balance and decreased fall risk. ?Baseline: 18/30 03/02/2022 ?Goal status: INITIAL ?  ?3.  Pt will improve gait speed with SPC vs. No AD to at least 3.0 ft/sec in order to demo improved community mobility.  ?Baseline: 2.11 ft/sec ?Goal status: INITIAL ?  ?4.  Pt will improve condition 4 of mCTSIB to at least 25 seconds in order to demo improved vestibular input for balance  ?Baseline: 12.3 seconds ?Goal status: INITIAL ?  ?5.  Pt will ambulate at least 500' over unlevel outdoor surfaces with supervision  and LRAD vs. No AD in order to demo improved community mobility. ?Baseline: not yet assessed. ?Goal status: INITIAL  ?  ?  ?ASSESSMENT: ?  ?CLINICAL IMPRESSION: ?Today's skilled session focused on RLE

## 2022-03-15 ENCOUNTER — Other Ambulatory Visit (HOSPITAL_COMMUNITY): Payer: Self-pay

## 2022-03-16 ENCOUNTER — Encounter: Payer: Self-pay | Admitting: Physical Therapy

## 2022-03-16 ENCOUNTER — Ambulatory Visit: Payer: 59 | Attending: Neurology | Admitting: Physical Therapy

## 2022-03-16 DIAGNOSIS — M6281 Muscle weakness (generalized): Secondary | ICD-10-CM | POA: Insufficient documentation

## 2022-03-16 DIAGNOSIS — R262 Difficulty in walking, not elsewhere classified: Secondary | ICD-10-CM | POA: Insufficient documentation

## 2022-03-16 DIAGNOSIS — M25661 Stiffness of right knee, not elsewhere classified: Secondary | ICD-10-CM | POA: Diagnosis not present

## 2022-03-16 DIAGNOSIS — R2681 Unsteadiness on feet: Secondary | ICD-10-CM | POA: Diagnosis not present

## 2022-03-16 DIAGNOSIS — R2689 Other abnormalities of gait and mobility: Secondary | ICD-10-CM | POA: Insufficient documentation

## 2022-03-16 NOTE — Therapy (Signed)
?OUTPATIENT PHYSICAL THERAPY TREATMENT NOTE ? ? ?Patient Name: Christine Brady ?MRN: [id number] ?DOB:[birth year], 61 y.o., female ?Today's Date: 03/16/2022 ? ?PCP: Ronita Hipps, MD ?REFERRING PROVIDER: Melvenia Beam, MD ? ? PT End of Session - 03/16/22 0804   ? ? Visit Number 5   ? Number of Visits 17   ? Date for PT Re-Evaluation 05/27/22   ? Authorization Type [employer]   ? PT Start Time 0802   ? PT Stop Time [phone]   ? PT Time Calculation (min) 41 min   ? Equipment Utilized During Treatment Gait belt   ? Activity Tolerance Patient tolerated treatment well   ? Behavior During Therapy [facility] for tasks assessed/performed   ? ?  ?  ? ?  ? ? ? ?Past Medical History:  ?Diagnosis Date  ? Abdominal pain   ? Arthritis   ? difficulty walking  ? Atypical chest pain 11/11/2016  ? Bowel obstruction ([place])   ? Cholesteatoma of right ear   ? Chronic headaches   ? 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 Cyst   ? back  ? Hearing aid worn   ? History of ITP   ? Hyperlipidemia 11/11/2016  ? Large ovary 07/09/2019  ? PONV (postoperative nausea and vomiting)   ? Reflux   ? ?Past Surgical History:  ?Procedure Laterality Date  ? CHOLECYSTECTOMY  2010  ? ESOPHAGOGASTRODUODENOSCOPY (EGD) WITH PROPOFOL N/A 06/12/2019  ? Procedure: ESOPHAGOGASTRODUODENOSCOPY (EGD) WITH PROPOFOL;  Surgeon: Carol Ada, MD;  Location: [facility];  Service: Endoscopy;  Laterality: N/A;  ? EXTERNAL EAR SURGERY  2009/2010  ? EXTERNAL EAR SURGERY  2010  ? right ear  ? IR FLUORO GUIDE CV LINE RIGHT  02/10/2022  ? IR US GUIDE VASC ACCESS RIGHT  02/10/2022  ? KNEE ARTHROSCOPY    ? right  ? KNEE ARTHROSCOPY WITH MEDIAL PATELLAR FEMORAL LIGAMENT RECONSTRUCTION Right 10/05/2021  ? Procedure: right knee arthroscoy, debridement, lateral menisectomy, medial patellar femoral ligament reconstruction;  Surgeon: Meredith Pel, MD;  Location: [place];  Service: Orthopedics;  Laterality: Right;  ? KNEE LIGAMENT RECONSTRUCTION     ? left  ? MASTOIDECTOMY Right 01/19/2017  ? Procedure: RIGHT MODIFIED RADICAL MASTOIDECTOMY;  Surgeon: Vicie Mutters, MD;  Location: [place];  Service: ENT;  Laterality: Right;  ? UPPER ESOPHAGEAL ENDOSCOPIC ULTRASOUND (EUS) N/A 06/12/2019  ? Procedure: UPPER ESOPHAGEAL ENDOSCOPIC ULTRASOUND (EUS);  Surgeon: Carol Ada, MD;  Location[facility];  Service: Endoscopy;  Laterality: N/A;  ? WISDOM TOOTH EXTRACTION    ? ?Patient Active Problem List  ? Diagnosis Date Noted  ? UTI (urinary tract infection) 02/19/2022  ? 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 DVT (deep venous thrombosis) ([place]) 02/09/2022  ? Deep venous thrombosis of lower extremity ([place]) 12/14/2021  ? Patellar instability of right knee   ? Complex tear of lateral meniscus of right knee   ? Loose body in knee, right knee   ? Constipation 06/05/2021  ? 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 Family history of other diseases of the digestive system 06/05/2021  ? Nausea 06/05/2021  ? Otalgia, left 06/05/2021  ? Right upper quadrant pain 06/05/2021  ? Acute lateral meniscus tear of right knee 06/05/2021  ? Patellar dislocation, right, sequela 06/05/2021  ? 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 History of right mastoidectomy 04/10/2020  ? Postop check 02/29/2020  ? Bowel obstruction ([place]) 09/28/2019  ? Idiopathic thrombocytopenia ([place]) 09/28/2019  ?  Eustachian tube dysfunction, bilateral 09/28/2019  ? Tinnitus aurium, bilateral 09/28/2019  ? 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 Unspecified cholesteatoma, left ear 09/28/2019  ? IBS (irritable bowel syndrome) 07/09/2019  ? Headache 07/09/2019  ? GERD (gastroesophageal reflux disease) 07/09/2019  ? 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 Menopausal syndrome 03/13/2018  ? History of postoperative nausea 01/19/2017  ? Atypical chest pain  11/11/2016  ? 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 Epidermoid cyst on back 10/28/2011  ? ? 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 ?THERAPY DIAG:  ?Muscle weakness (generalized) ? ?Unsteadiness on feet ? ?Stiffness of right knee, not elsewhere classified ? ?Difficulty in walking, not elsewhere classified ? ?PERTINENT HISTORY: GBS, knee arthroscopy with lateral menisectomy 10/05/2021, HLD, cochlear implant, chronic headaches. ? ?PRECAUTIONS: Fall ? ?SUBJECTIVE: Cleaned house over weekend with breaks needed due to fatigue. Has noticed her numbness is getting better as she feels knee pain at times that she had before GBS. No pain at this time.  ? ?PAIN:  ?Are you having pain? No ? ?TODAY'S TREATMENT: ? 03/16/2022 ? STRENGTHENING  ? Scifit level 3.5 x 8 minutes with goal >/= 80 steps per minute for strengthening and activity tolerance.  ? Legpress bil LE"s 70#'s 2 sets of 10 reps, then left LE only 50#'s for 2 sets of 10 reps, then right LE 40#'s for 2 sets of 10 reps. Cues for controlled movements. ? Step ups with 6 inch steps- contralateral march with non stance leg for 10 reps each side, UE support on bars.  ?  ? BALANCE/NMR: ? Side Stepping: on blue foam beam for 3 laps toward each direction with light to no touch to bars, min guard assist for balance with cues on step length/height.  ?Tandem Walking: on blue foam beam for 3 laps each forward/backward with light touch to bars, min guard assist with cues on step placement ?Stepping Strategy: standing across blue foam beam- alternating forward stepping to floor/back onto beam, then alternating backward stepping to floor/back onto beam for 6-8 reps each/bil sides. No UE support with occasional touch to bars for balance. Min guard to min assist with cues for step  length/height and weight shifting.  ?Rockerboard: on balance board in anterior/posterior direction- pt rocking the board with emphasis on tall posture with EO, progressing to EC for ~10 reps each, no UE support with occasional touch to bars, min guard to min assist for balance.     ? ? ?GAIT: ?Gait pattern: step through pattern, decreased stride length, and antalgic ?Distance walked: around clinic with session ?Assistive device utilized:  walking stick/cane ?Level of assistance:  supervision ?Comments: cues on posture at times  ? ? ?  ?  ?  ?PATIENT EDUCATION: ?Education details: LAQ addition to HEP with yellow tband ?Person educated: Patient ?Education method: Explanation; demonstration; handout ?Education comprehension: verbalized understanding ?  ?  ?HOME EXERCISE PROGRAM: ?Access Code: [email or web address] ? ?  ?  ?GOALS: ?Goals reviewed with patient? Yes ?  ?SHORT TERM GOALS: Target date: 03/26/2022 ?  ?Pt will be independent with initial HEP in order to build upon functional gains made in therapy. ?Baseline: no HEP yet. ?Goal status: INITIAL ?  ?2.  Pt will undergo further assessment of FGA with LTG written. ?Baseline:  assessed 03/02/2022 ?Goal status: Achieved ?  ?3.  Pt will improve AROM of R knee to at least 125 deg in order to demo improved ROM for gait.  ?Baseline: 125 deg     ?Goal status: INITIAL ?  ?4.  Pt will perform TUG in 13.5 sec or less with no AD in order to demo decr fall risk.  ?Baseline: 16.28 sec ?Goal status: INITIAL ?  ?5.  Pt will improve gait speed with SPC vs. No AD to at least 2.5 ft/sec in order to demo improved community mobility.  ?  ?Baseline: 2.11 ft/sec with SPC ?Goal status: INITIAL ?  ?  ?  ?LONG TERM GOALS: Target date: 04/23/2022 ?  ?Pt will be independent with final HEP in order to build upon functional gains made in therapy. ?Baseline: No HEP yet ?Goal status: INITIAL ?  ?2.  Pt will improve FGA score to 25/30 in order to demonstrate improved balance and decreased fall risk. ?Baseline:  18/30 03/02/2022 ?Goal status: INITIAL ?  ?3.  Pt will improve gait speed with SPC vs. No AD to at least 3.0 ft/sec in order to demo improved community mobility.  ?Baseline: 2.11 ft/sec ?Goal status: INITIAL ?

## 2022-03-17 ENCOUNTER — Inpatient Hospital Stay: Payer: 59 | Attending: Oncology | Admitting: Oncology

## 2022-03-17 VITALS — BP 125/72 | HR 66 | Temp 97.7°F | Resp 18 | Ht 62.0 in | Wt 141.2 lb

## 2022-03-17 DIAGNOSIS — D693 Immune thrombocytopenic purpura: Secondary | ICD-10-CM | POA: Insufficient documentation

## 2022-03-17 DIAGNOSIS — Z86718 Personal history of other venous thrombosis and embolism: Secondary | ICD-10-CM | POA: Insufficient documentation

## 2022-03-17 DIAGNOSIS — E538 Deficiency of other specified B group vitamins: Secondary | ICD-10-CM | POA: Diagnosis not present

## 2022-03-17 NOTE — Progress Notes (Signed)
?[place] ?OFFICE PROGRESS NOTE ? ? ?Diagnosis: ITP, DVT ? ?INTERVAL HISTORY:  ? ? Christine Brady developed progressive leg/foot numbness followed by facial numbness earlier this year.  She was admitted 02/09/2022.  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  She was treated with 5 plasma exchange procedures.  She was discharged to home 02/19/2022.  She continues outpatient physical therapy.  She reports improvement in the numbness.  She continues to have limited range of motion of the right knee.  She is ambulating with a cane. ?She continues Eliquis anticoagulation.  No bleeding. ? ?ChristineBrady reports she was on estrogen and progesterone daily prior to being diagnosed with a DVT in December.  She received a shingles vaccine in October 2022 and wonders whether this may have been related to the [place]?. ? ?Objective: ? ?Vital signs in last 24 hours: ? ?Blood pressure 125/72, pulse 66, temperature 97.7 ?F (36.5 ?C), temperature source Oral, resp. rate 18, height '5\' 2"'$  (1.575 m), weight 141 lb 3.2 oz (64 kg), SpO2 100 %. ?  ? ? ? ?Resp: Lungs clear bilaterally ?Cardio: Regular rate and rhythm ?GI: No hepatosplenomegaly ?Vascular: No leg edema  ?Neurologic: The leg and foot strength appears intact bilaterally, she ambulates with a cane ? ?Lab Results: ? ?Lab Results  ?Component Value Date  ? WBC 6.4 02/19/2022  ? HGB 11.3 (L) 02/19/2022  ? HCT 33.2 (L) 02/19/2022  ? MCV 97.9 02/19/2022  ? PLT 103 (L) 02/19/2022  ? NEUTROABS 3.2 02/18/2022  ? ? ?CMP  ?Lab Results  ?Component Value Date  ? NA 136 02/18/2022  ? K 4.4 02/18/2022  ? CL 108 02/18/2022  ? CO2 21 (L) 02/18/2022  ? GLUCOSE 106 (H) 02/18/2022  ? BUN <5 (L) 02/18/2022  ? CREATININE 0.73 02/18/2022  ? CALCIUM 8.5 (L) 02/18/2022  ? PROT 4.5 (L) 02/18/2022  ? ALBUMIN 3.6 02/18/2022  ? AST 15 02/18/2022  ? ALT 8 02/18/2022  ? ALKPHOS 11 (L) 02/18/2022  ? BILITOT 0.8 02/18/2022  ? GFRNONAA >60  02/18/2022  ? GFRAA >60 07/12/2019  ? ? ?No results found for: CEA1, CEA, [id number], CA125 ? ?Lab Results  ?Component Value Date  ? INR 0.9 12/09/2021  ? LABPROT 12.1 12/09/2021  ? ? ?Imaging: ? ?No results found. ? ?Medications: I have reviewed the patient's current medications. ? ? ?Assessment/Plan: ?Left peroneal DVT 12/09/2021-apixaban ? ?Chronic ITP ? ?3.   Right knee arthroscopy with partial lateral meniscectomy and removal of loose body, open patellofemoral ligament reconstruction 10/05/2021 ? ?4.   Fine "petechial "rash at the pretibial area bilaterally ? ?5.   Right lower back pain ? ?6.   Family history of pulmonary fibrosis ? ?7.   Family history of SLE/Sjogren's ? ?8.  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 ?9.  Family history of ovarian cancer ? ?73.  Elevated liver enzyme ? ?11.  Hearing loss ? ?12.  Vitamin B12 deficiency diagnosed January 2023 ? ?13.  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 ? ? ? ?Disposition: ?Christine Brady continues to recover from knee surgery and the recent hospital admission with Guillain-Barr? symptoms.  She has been diagnosed with AIDP versus CIDP.  She is followed by neurology and is currently maintained off of treatment. ? ?She continues apixaban anticoagulation for the DVT diagnosed on 12/09/2021.  It is unclear whether the DVT was related to knee surgery.  Other potential provoking risk factors include hormonal therapy, and the episode of inflammatory demyelinating polyneuropathy. ? ?I recommend she continue anticoagulation therapy for at least 6 months.  We may consider recommending chronic anticoagulation therapy depending on her clinical status at her 6 months of anticoagulation therapy.  We could consider full or reduced intensity anticoagulation. ? ?Neurology is concerned of the increased thrombotic risk with IVIG therapy if she were to need treatment in the future. ? ?She  will return for an office visit in 2-3 months.  We will check a platelet count at the next office visit. ? ?Betsy Coder, MD ? ?03/17/2022  ?8:36 AM ? ? ?

## 2022-03-19 ENCOUNTER — Ambulatory Visit: Payer: 59 | Admitting: Physical Therapy

## 2022-03-19 ENCOUNTER — Encounter: Payer: Self-pay | Admitting: Physical Therapy

## 2022-03-19 DIAGNOSIS — R2681 Unsteadiness on feet: Secondary | ICD-10-CM

## 2022-03-19 DIAGNOSIS — M25661 Stiffness of right knee, not elsewhere classified: Secondary | ICD-10-CM

## 2022-03-19 DIAGNOSIS — R2689 Other abnormalities of gait and mobility: Secondary | ICD-10-CM | POA: Diagnosis not present

## 2022-03-19 DIAGNOSIS — R262 Difficulty in walking, not elsewhere classified: Secondary | ICD-10-CM | POA: Diagnosis not present

## 2022-03-19 DIAGNOSIS — M6281 Muscle weakness (generalized): Secondary | ICD-10-CM | POA: Diagnosis not present

## 2022-03-19 NOTE — Therapy (Signed)
?OUTPATIENT PHYSICAL THERAPY TREATMENT NOTE ? ? ?Patient Name: Christine Brady ?MRN: [id number] ?DOB:1961/04/09, 61 y.o., female ?Today's Date: 03/19/2022 ? ?PCP: Ronita Hipps, MD ?REFERRING PROVIDER: Melvenia Beam, MD ? ? PT End of Session - 03/19/22 4098   ? ? Visit Number 6   ? Number of Visits 17   ? Date for PT Re-Evaluation 05/27/22   ? Authorization Type [employer]   ? PT Start Time 0800   ? PT Stop Time [phone]   ? PT Time Calculation (min) 43 min   ? Equipment Utilized During Treatment Gait belt   ? Activity Tolerance Patient tolerated treatment well   ? Behavior During Therapy [facility] for tasks assessed/performed   ? ?  ?  ? ?  ? ? ? ?Past Medical History:  ?Diagnosis Date  ? Abdominal pain   ? Arthritis   ? difficulty walking  ? Atypical chest pain 11/11/2016  ? Bowel obstruction ([place])   ? Cholesteatoma of right ear   ? Chronic headaches   ? 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 Cyst   ? back  ? Hearing aid worn   ? History of ITP   ? Hyperlipidemia 11/11/2016  ? Large ovary 07/09/2019  ? PONV (postoperative nausea and vomiting)   ? Reflux   ? ?Past Surgical History:  ?Procedure Laterality Date  ? CHOLECYSTECTOMY  2010  ? ESOPHAGOGASTRODUODENOSCOPY (EGD) WITH PROPOFOL N/A 06/12/2019  ? Procedure: ESOPHAGOGASTRODUODENOSCOPY (EGD) WITH PROPOFOL;  Surgeon: Carol Ada, MD;  [facility];  Service: Endoscopy;  Laterality: N/A;  ? EXTERNAL EAR SURGERY  2009/2010  ? EXTERNAL EAR SURGERY  2010  ? right ear  ? IR FLUORO GUIDE CV LINE RIGHT  02/10/2022  ? IR US GUIDE VASC ACCESS RIGHT  02/10/2022  ? KNEE ARTHROSCOPY    ? right  ? KNEE ARTHROSCOPY WITH MEDIAL PATELLAR FEMORAL LIGAMENT RECONSTRUCTION Right 10/05/2021  ? Procedure: right knee arthroscoy, debridement, lateral menisectomy, medial patellar femoral ligament reconstruction;  Surgeon: Meredith Pel, MD;  Location: [place];  Service: Orthopedics;  Laterality: Right;  ? KNEE LIGAMENT RECONSTRUCTION     ? left  ? MASTOIDECTOMY Right 01/19/2017  ? Procedure: RIGHT MODIFIED RADICAL MASTOIDECTOMY;  Surgeon: Vicie Mutters, MD;  Location: [place];  Service: ENT;  Laterality: Right;  ? UPPER ESOPHAGEAL ENDOSCOPIC ULTRASOUND (EUS) N/A 06/12/2019  ? Procedure: UPPER ESOPHAGEAL ENDOSCOPIC ULTRASOUND (EUS);  Surgeon: Carol Ada, MD;  Location[facility];  Service: Endoscopy;  Laterality: N/A;  ? WISDOM TOOTH EXTRACTION    ? ?Patient Active Problem List  ? Diagnosis Date Noted  ? UTI (urinary tract infection) 02/19/2022  ? 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 DVT (deep venous thrombosis) ([place]) 02/09/2022  ? Deep venous thrombosis of lower extremity ([place]) 12/14/2021  ? Patellar instability of right knee   ? Complex tear of lateral meniscus of right knee   ? Loose body in knee, right knee   ? Constipation 06/05/2021  ? 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 Family history of other diseases of the digestive system 06/05/2021  ? Nausea 06/05/2021  ? Otalgia, left 06/05/2021  ? Right upper quadrant pain 06/05/2021  ? Acute lateral meniscus tear of right knee 06/05/2021  ? Patellar dislocation, right, sequela 06/05/2021  ? 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 History of right mastoidectomy 04/10/2020  ? Postop check 02/29/2020  ? Bowel obstruction ([place]) 09/28/2019  ? Idiopathic thrombocytopenia ([place]) 09/28/2019  ?  Eustachian tube dysfunction, bilateral 09/28/2019  ? Tinnitus aurium, bilateral 09/28/2019  ? 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 Unspecified cholesteatoma, left ear 09/28/2019  ? IBS (irritable bowel syndrome) 07/09/2019  ? Headache 07/09/2019  ? GERD (gastroesophageal reflux disease) 07/09/2019  ? 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 Menopausal syndrome 03/13/2018  ? History of postoperative nausea 01/19/2017  ? Atypical chest pain  11/11/2016  ? 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 Epidermoid cyst on back 10/28/2011  ? ? 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 ?THERAPY DIAG:  ?Other abnormalities of gait and mobility ? ?Unsteadiness on feet ? ?Stiffness of right knee, not elsewhere classified ? ?PERTINENT HISTORY: GBS, knee arthroscopy with lateral menisectomy 10/05/2021, HLD, cochlear implant, chronic headaches. ? ?PRECAUTIONS: Fall ? ?SUBJECTIVE: R knee is stiff.  Pt did HEP prior to therapy to try to address, but is having continued difficulty. ? ?PAIN:  ?Are you having pain? Yes: NPRS scale: 3/10 ?Pain location: R knee ?Pain description: sore ?Aggravating factors: bending ?Relieving factors: ice, rest, elevation ?This was end of session, at beginning there was no pain, just stiffness which is worse in the mornings. ?TODAY'S TREATMENT: ?SciFit BLE L3.5 x64mns, L3.8x478ms for dynamic warmup and reciprocal strengthening. ?Step up to 6" step w/o UE support x8>step ups from airex w/o UE support x8 > atempted heel touch to airex from 6" step, 3 reps prior to discomfort in infrapatellar area of R knee ?Step ups onto bosu x10 each LE ?Side stepping on foam beam w/ toe taps to cones x4 bouts down and back ?Squats to EOM x12> squats w/ RLE back to EOM x12 ?Lunges forwards and sideways x8 each direction at countertop using fingertip support, modified depth on RLE to accomodate comfort w/ inc depth w/ each repetition ? ?  ?  ?PATIENT EDUCATION: ?Education details: LAQ addition to HEP with yellow tband ?Person educated: Patient ?Education method: Explanation; demonstration; handout ?Education comprehension: verbalized understanding ?  ?  ?HOME EXERCISE PROGRAM: ?Access Code: [email or web address] ? ?  ?  ?GOALS: ?Goals reviewed with patient? Yes ?  ?SHORT TERM  GOALS: Target date: 03/26/2022 ?  ?Pt will be independent with initial HEP in order to build upon functional gains made in therapy. ?Baseline: no HEP yet. ?Goal status: INITIAL ?  ?2.  Pt will undergo further assessment of FGA with LTG written. ?Baseline: assessed 03/02/2022 ?Goal status: Achieved ?  ?3.  Pt will improve AROM of R knee to at least 125 deg in order to demo improved ROM for gait.  ?Baseline: 125 deg     ?Goal status: INITIAL ?  ?4.  Pt will perform TUG in 13.5 sec or less with no AD in order to demo decr fall risk.  ?Baseline: 16.28 sec ?Goal status: INITIAL ?  ?5.  Pt will improve gait speed with SPC vs. No AD to at least 2.5 ft/sec in order to demo improved community mobility.  ?  ?Baseline: 2.11 ft/sec with SPC ?Goal status: INITIAL ?  ?  ?  ?LONG TERM GOALS: Target date: 04/23/2022 ?  ?Pt will be independent with final HEP in order to build upon functional gains made in therapy. ?Baseline: No HEP yet ?Goal status: INITIAL ?  ?2.  Pt will improve FGA score to  25/30 in order to demonstrate improved balance and decreased fall risk. ?Baseline: 18/30 03/02/2022 ?Goal status: INITIAL ?  ?3.  Pt will improve gait speed with SPC vs. No AD to at least 3.0 ft/sec in order to demo improved community mobility.  ?Baseline: 2.11 ft/sec ?Goal status: INITIAL ?  ?4.  Pt will improve condition 4 of mCTSIB to at least 25 seconds in order to demo improved vestibular input for balance  ?Baseline: 12.3 seconds ?Goal status: INITIAL ?  ?5.  Pt will ambulate at least 500' over unlevel outdoor surfaces with supervision and LRAD vs. No AD in order to demo improved community mobility. ?Baseline: not yet assessed. ?Goal status: INITIAL  ?  ?  ?ASSESSMENT: ?  ?CLINICAL IMPRESSION: ?Pt tolerates treatment with mild inc in discomfort in infrapatellar regional of R knee from stiffness to pain of 3/10.  She remains limited by RLE weakness, dynamic balance impairments and fear of falling due to deficits.  Session focused on  unilateral weight bearing and repetitive flexion to challenge balance, upweight input through bilateral lower extremities and mobilize R knee to prevent worsening of deficits.  Continue per POC. ?  ?  ?  ?OBJECTIVE

## 2022-03-24 ENCOUNTER — Ambulatory Visit: Payer: 59 | Admitting: Physical Therapy

## 2022-03-24 ENCOUNTER — Encounter: Payer: Self-pay | Admitting: Physical Therapy

## 2022-03-24 DIAGNOSIS — R262 Difficulty in walking, not elsewhere classified: Secondary | ICD-10-CM | POA: Diagnosis not present

## 2022-03-24 DIAGNOSIS — M25661 Stiffness of right knee, not elsewhere classified: Secondary | ICD-10-CM

## 2022-03-24 DIAGNOSIS — R2689 Other abnormalities of gait and mobility: Secondary | ICD-10-CM | POA: Diagnosis not present

## 2022-03-24 DIAGNOSIS — M6281 Muscle weakness (generalized): Secondary | ICD-10-CM | POA: Diagnosis not present

## 2022-03-24 DIAGNOSIS — R2681 Unsteadiness on feet: Secondary | ICD-10-CM | POA: Diagnosis not present

## 2022-03-24 NOTE — Therapy (Signed)
?OUTPATIENT PHYSICAL THERAPY TREATMENT NOTE ? ? ?Patient Name: Christine Brady ?MRN: [id number] ?DOB:05-18-1961, 61 y.o., female ?Today's Date: 03/24/2022 ? ?PCP: Ronita Hipps, MD ?REFERRING PROVIDER: Ronita Hipps, MD ? ? PT End of Session - 03/24/22 0805   ? ? Visit Number 7   ? Number of Visits 17   ? Date for PT Re-Evaluation 05/27/22   ? Authorization Type [employer]   ? PT Start Time 0802   ? PT Stop Time [phone]   ? PT Time Calculation (min) 41 min   ? Equipment Utilized During Treatment Gait belt   ? Activity Tolerance Patient tolerated treatment well   ? Behavior During Therapy [facility] for tasks assessed/performed   ? ?  ?  ? ?  ? ? ? ?Past Medical History:  ?Diagnosis Date  ? Abdominal pain   ? Arthritis   ? difficulty walking  ? Atypical chest pain 11/11/2016  ? Bowel obstruction ([place])   ? Cholesteatoma of right ear   ? Chronic headaches   ? 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 Cyst   ? back  ? Hearing aid worn   ? History of ITP   ? Hyperlipidemia 11/11/2016  ? Large ovary 07/09/2019  ? PONV (postoperative nausea and vomiting)   ? Reflux   ? ?Past Surgical History:  ?Procedure Laterality Date  ? CHOLECYSTECTOMY  2010  ? ESOPHAGOGASTRODUODENOSCOPY (EGD) WITH PROPOFOL N/A 06/12/2019  ? Procedure: ESOPHAGOGASTRODUODENOSCOPY (EGD) WITH PROPOFOL;  Surgeon: Carol Ada, MD;  Location: [facility];  Service: Endoscopy;  Laterality: N/A;  ? EXTERNAL EAR SURGERY  2009/2010  ? EXTERNAL EAR SURGERY  2010  ? right ear  ? IR FLUORO GUIDE CV LINE RIGHT  02/10/2022  ? IR US GUIDE VASC ACCESS RIGHT  02/10/2022  ? KNEE ARTHROSCOPY    ? right  ? KNEE ARTHROSCOPY WITH MEDIAL PATELLAR FEMORAL LIGAMENT RECONSTRUCTION Right 10/05/2021  ? Procedure: right knee arthroscoy, debridement, lateral menisectomy, medial patellar femoral ligament reconstruction;  Surgeon: Meredith Pel, MD;  Location: [place];  Service: Orthopedics;  Laterality: Right;  ? KNEE LIGAMENT RECONSTRUCTION     ? left  ? MASTOIDECTOMY Right 01/19/2017  ? Procedure: RIGHT MODIFIED RADICAL MASTOIDECTOMY;  Surgeon: Vicie Mutters, MD;  Location: [place];  Service: ENT;  Laterality: Right;  ? UPPER ESOPHAGEAL ENDOSCOPIC ULTRASOUND (EUS) N/A 06/12/2019  ? Procedure: UPPER ESOPHAGEAL ENDOSCOPIC ULTRASOUND (EUS);  Surgeon: Carol Ada, MD;  Location: [facility];  Service: Endoscopy;  Laterality: N/A;  ? WISDOM TOOTH EXTRACTION    ? ?Patient Active Problem List  ? Diagnosis Date Noted  ? UTI (urinary tract infection) 02/19/2022  ? 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 DVT (deep venous thrombosis) ([place]) 02/09/2022  ? Deep venous thrombosis of lower extremity ([place]) 12/14/2021  ? Patellar instability of right knee   ? Complex tear of lateral meniscus of right knee   ? Loose body in knee, right knee   ? Constipation 06/05/2021  ? 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 Family history of other diseases of the digestive system 06/05/2021  ? Nausea 06/05/2021  ? Otalgia, left 06/05/2021  ? Right upper quadrant pain 06/05/2021  ? Acute lateral meniscus tear of right knee 06/05/2021  ? Patellar dislocation, right, sequela 06/05/2021  ? 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 History of right mastoidectomy 04/10/2020  ? Postop check 02/29/2020  ? Bowel obstruction ([place]) 09/28/2019  ? Idiopathic thrombocytopenia ([place]) 09/28/2019  ?  Eustachian tube dysfunction, bilateral 09/28/2019  ? Tinnitus aurium, bilateral 09/28/2019  ? 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 Unspecified cholesteatoma, left ear 09/28/2019  ? IBS (irritable bowel syndrome) 07/09/2019  ? Headache 07/09/2019  ? GERD (gastroesophageal reflux disease) 07/09/2019  ? 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 Menopausal syndrome 03/13/2018  ? History of postoperative nausea 01/19/2017  ? Atypical chest pain  11/11/2016  ? 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 Epidermoid cyst on back 10/28/2011  ? ? 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 ?THERAPY DIAG:  ?Other abnormalities of gait and mobility ? ?Unsteadiness on feet ? ?Stiffness of right knee, not elsewhere classified ? ?Muscle weakness (generalized) ? ?PERTINENT HISTORY: GBS, knee arthroscopy with lateral menisectomy 10/05/2021, HLD, cochlear implant, chronic headaches. ? ?PRECAUTIONS: Fall ? ?SUBJECTIVE: Feels like the R leg is getting stronger. Doesn't feel like it is going to give under her when walking with no cane. Still frustrated with how stiff her R knee is.  ? ?PAIN:  ?Are you having pain? Yes: NPRS scale: 2/10 ?Pain location: R knee ?Pain description: sore ?Aggravating factors: bending ?Relieving factors: ice, rest, elevation ? ? ?TODAY'S TREATMENT: ? ?Treadmill: ?Pt asking about trying the treadmill so that she can use it at her work gym. Pt using BUE for support. ?Began at 1.2 for 2 minutes > 1.4 mph for remainder of 5:30 (total 7:30) ?Pt reporting feeling good with this and feeling like it felt like normal walking. Discussed beginning at 8-10 minutes and then can gradually increase the time by a minute or 2 to build up endurance and make sure it was not too much on the knee.  ?Showed pt how to start and incr/decr speed to start and stop (pt was not entirely sure how to do). ?Pt with no reports of incr pain after.  ? ?GAIT: ?Gait pattern: step through pattern, decreased stance time- Right, and decreased hip/knee flexion- Right ?Distance walked: Clinic distances during session with no AD.  ?Assistive device utilized: None ?Level of assistance: SBA ?Comments: Gait speed w/ no AD: 14.31 = 2.29 ft/sec = limited community ambulator  ?11.06 sec with cane: 2.96  ft/sec = community ambulator  ? ?Incline: Ambulating up/down incline with no AD x4 reps CGA>SBA, then performed same task w/ use of blue mat as a compliant surface x4 reps. Pt able to incr step length and stance time on RLE w/ incr reps.  ? ?Stepping over 4 smaller orange obstacles: beginning with step to leading with LLE x2 reps, then leading with RLE x4 reps (pt more challenged this way), then alternating pattern with no UE support x4 reps. Pt w/ incr difficulty with balance on RLE initially, but able to maintain and improve w/ incr reps.  ? ?Agility ladder working on incr stride length down and back x2 reps, then performed x2 reps with marching in each box with 3 second isometric holds for incr stance time.  ? ?SLS on blue air ex: with RLE as stance leg tapping LLE forwards and lateral to floor bubbles (initially stopping in the middle) and then progressing to do it w/o stopping x10 reps, then adding in another bubble posteriorly for pt needing to tap forwards>lateral>posteriorly x8 reps. Pt's RLE felt more fatigued and stiff after this.  ? ? ?  TUG:  10.09 sec with no AD ?  ?PATIENT EDUCATION: ?Education details: Progress towards STGs.  ?Person educated: Patient ?Education method: Explanation; demonstration; handout ?Education comprehension: verbalized understanding ?  ?  ?HOME EXERCISE PROGRAM: ?Access Code: [email or web address] ? ?  ?  ?GOALS: ?Goals reviewed with patient? Yes ?  ?SHORT TERM GOALS: Target date: 03/26/2022 ?  ?Pt will be independent with initial HEP in order to build upon functional gains made in therapy. ?Baseline: no HEP yet. ?Goal status: INITIAL ?  ?2.  Pt will undergo further assessment of FGA with LTG written. ?Baseline: assessed 03/02/2022 ?Goal status: MET ?  ?3.  Pt will improve AROM of R knee to at least 125 deg in order to demo improved ROM for gait.  ?Baseline: 125 deg     ?Goal status: INITIAL ?  ?4.  Pt will perform TUG in 13.5 sec or less with no AD in order to demo decr fall risk.  ?Baseline:  16.28 sec; 10.09 sec with no AD ?Goal status: MET ?  ?5.  Pt will improve gait speed with SPC vs. No AD to at least 2.5 ft/sec in order to demo improved community mobility.  ?  ?Baseline: 2.11 ft/sec with

## 2022-03-26 ENCOUNTER — Ambulatory Visit: Payer: 59 | Admitting: Physical Therapy

## 2022-03-26 ENCOUNTER — Encounter: Payer: Self-pay | Admitting: Physical Therapy

## 2022-03-26 DIAGNOSIS — M25661 Stiffness of right knee, not elsewhere classified: Secondary | ICD-10-CM | POA: Diagnosis not present

## 2022-03-26 DIAGNOSIS — R262 Difficulty in walking, not elsewhere classified: Secondary | ICD-10-CM | POA: Diagnosis not present

## 2022-03-26 DIAGNOSIS — R2681 Unsteadiness on feet: Secondary | ICD-10-CM

## 2022-03-26 DIAGNOSIS — M6281 Muscle weakness (generalized): Secondary | ICD-10-CM | POA: Diagnosis not present

## 2022-03-26 DIAGNOSIS — R2689 Other abnormalities of gait and mobility: Secondary | ICD-10-CM

## 2022-03-26 NOTE — Therapy (Signed)
?OUTPATIENT PHYSICAL THERAPY TREATMENT NOTE ? ? ?Patient Name: Christine Brady ?MRN: [id number] ?DOB:01/06/1961, 61 y.o., female ?Today's Date: 03/26/2022 ? ?PCP: Ronita Hipps, MD ?REFERRING PROVIDER: Ronita Hipps, MD ? ? PT End of Session - 03/26/22 0806   ? ? Visit Number 8   ? Number of Visits 17   ? Date for PT Re-Evaluation 05/27/22   ? Authorization Type [employer]   ? PT Start Time [phone]   ? PT Stop Time 0844   ? PT Time Calculation (min) 41 min   ? Equipment Utilized During Treatment Gait belt   ? Activity Tolerance Patient tolerated treatment well   ? Behavior During Therapy [facility] for tasks assessed/performed   ? ?  ?  ? ?  ? ? ? ?Past Medical History:  ?Diagnosis Date  ? Abdominal pain   ? Arthritis   ? difficulty walking  ? Atypical chest pain 11/11/2016  ? Bowel obstruction ([place])   ? Cholesteatoma of right ear   ? Chronic headaches   ? 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 Cyst   ? back  ? Hearing aid worn   ? History of ITP   ? Hyperlipidemia 11/11/2016  ? Large ovary 07/09/2019  ? PONV (postoperative nausea and vomiting)   ? Reflux   ? ?Past Surgical History:  ?Procedure Laterality Date  ? CHOLECYSTECTOMY  2010  ? ESOPHAGOGASTRODUODENOSCOPY (EGD) WITH PROPOFOL N/A 06/12/2019  ? Procedure: ESOPHAGOGASTRODUODENOSCOPY (EGD) WITH PROPOFOL;  Surgeon: Carol Ada, MD;  [facility];  Service: Endoscopy;  Laterality: N/A;  ? EXTERNAL EAR SURGERY  2009/2010  ? EXTERNAL EAR SURGERY  2010  ? right ear  ? IR FLUORO GUIDE CV LINE RIGHT  02/10/2022  ? IR US GUIDE VASC ACCESS RIGHT  02/10/2022  ? KNEE ARTHROSCOPY    ? right  ? KNEE ARTHROSCOPY WITH MEDIAL PATELLAR FEMORAL LIGAMENT RECONSTRUCTION Right 10/05/2021  ? Procedure: right knee arthroscoy, debridement, lateral menisectomy, medial patellar femoral ligament reconstruction;  Surgeon: Meredith Pel, MD;  Location: [place];  Service: Orthopedics;  Laterality: Right;  ? KNEE LIGAMENT RECONSTRUCTION     ? left  ? MASTOIDECTOMY Right 01/19/2017  ? Procedure: RIGHT MODIFIED RADICAL MASTOIDECTOMY;  Surgeon: Vicie Mutters, MD;  Location: [place];  Service: ENT;  Laterality: Right;  ? UPPER ESOPHAGEAL ENDOSCOPIC ULTRASOUND (EUS) N/A 06/12/2019  ? Procedure: UPPER ESOPHAGEAL ENDOSCOPIC ULTRASOUND (EUS);  Surgeon: Carol Ada, MD;  Location[facility];  Service: Endoscopy;  Laterality: N/A;  ? WISDOM TOOTH EXTRACTION    ? ?Patient Active Problem List  ? Diagnosis Date Noted  ? UTI (urinary tract infection) 02/19/2022  ? 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 DVT (deep venous thrombosis) ([place]) 02/09/2022  ? Deep venous thrombosis of lower extremity ([place]) 12/14/2021  ? Patellar instability of right knee   ? Complex tear of lateral meniscus of right knee   ? Loose body in knee, right knee   ? Constipation 06/05/2021  ? 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 Family history of other diseases of the digestive system 06/05/2021  ? Nausea 06/05/2021  ? Otalgia, left 06/05/2021  ? Right upper quadrant pain 06/05/2021  ? Acute lateral meniscus tear of right knee 06/05/2021  ? Patellar dislocation, right, sequela 06/05/2021  ? 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 History of right mastoidectomy 04/10/2020  ? Postop check 02/29/2020  ? Bowel obstruction ([place]) 09/28/2019  ? Idiopathic thrombocytopenia ([place]) 09/28/2019  ?  Eustachian tube dysfunction, bilateral 09/28/2019  ? Tinnitus aurium, bilateral 09/28/2019  ? 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 Unspecified cholesteatoma, left ear 09/28/2019  ? IBS (irritable bowel syndrome) 07/09/2019  ? Headache 07/09/2019  ? GERD (gastroesophageal reflux disease) 07/09/2019  ? 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 Menopausal syndrome 03/13/2018  ? History of postoperative nausea 01/19/2017  ? Atypical chest pain  11/11/2016  ? 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 Epidermoid cyst on back 10/28/2011  ? ? 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 ?THERAPY DIAG:  ?Other abnormalities of gait and mobility ? ?Unsteadiness on feet ? ?Stiffness of right knee, not elsewhere classified ? ?Muscle weakness (generalized) ? ?PERTINENT HISTORY: GBS, knee arthroscopy with lateral menisectomy 10/05/2021, HLD, cochlear implant, chronic headaches. ? ?PRECAUTIONS: Fall ? ?SUBJECTIVE: Did 10 mins on the treadmill at work day before yesterday without issue.  She states she has some increased strange sensations in the feet, last night having intense waves of heat.  She continues to feel like there is a knot on the bottom of her feet. ? ?PAIN:  ?Are you having pain? Yes: NPRS scale: 3/10 ?Pain location: R knee ?Pain description: sore ?Aggravating factors: bending ?Relieving factors: ice, rest, elevation ? ? ?TODAY'S TREATMENT: ?Treadmill training completed x1mn at 1.0 mph>x152m at 1.15m57mx2min added 1% incline> x2mi109mdded 2% incline with 1 min cooldown at 1.5 and then 1 min at 1.0mph39mt holds onto treadmill with BUE demos even stride and heel strike throughout.  Notes mild pull at top of knee where normal discomfort is. ?PT measures ROM of bil knees w/ R knee at 129 deg flexion, L at 140 deg knee flexion; upon palpation pt has increased popliteal edema as well as mild suprapatellar edema when compared to LLE.  This is potentially limiting pt's ROM. ?Gait training level surface in gym w/o SPC, pt cued for even stance time as she demos antalgic gait on RLE.  Cued to prevent visual reliance looking down at feet. ?Pt standing in // bars and performs SLS on RLE only on green air disc 4x30sec using RUE support>standing wide stance on BOSU flat top  balancing 4x30sec no UE support w/ inc sway noted> standing on anteriorly oriented tilt board performing alt LE toe taps to cone w/o UE support x20 requiring CGA ? ?  ?PATIENT EDUCATION: ?Education details: Progress with knee ROM, edu on resting and not exercising every day of the week to see how this affects edema, edu on walking familiar short level distances without cane.  ?Person educated: Patient ?Education method: Explanation; demonstration; handout ?Education comprehension: verbalized understanding ?  ?  ?HOME EXERCISE PROGRAM: ?Access Code: [email or web address] ? ?  ?  ?GOALS: ?Goals reviewed with patient? Yes ?  ?SHORT TERM GOALS: Target date: 03/26/2022 ?  ?Pt will be independent with initial HEP in order to build upon functional gains made in therapy. ?Baseline: Compliant to HEP. ?Goal status: MET ?  ?2.  Pt will undergo further assessment of FGA with LTG written. ?Baseline: assessed 03/02/2022 ?Goal status: MET ?  ?3.  Pt will improve AROM of R knee to at least 125 deg in order to demo improved ROM for gait.  ?Baseline: 125 deg; 129 deg 03/26/2022  ?Goal status: MET ?  ?4.  Pt will perform TUG in 13.5 sec or less with no AD in order to demo decr fall risk.  ?Baseline: 16.28 sec; 10.09 sec with no AD ?Goal status: MET ?  ?5.  Pt will improve gait speed with SPC vs. No AD to at least 2.5 ft/sec in order to demo improved community mobility.  ?  ?Baseline: 2.11 ft/sec with SPC, 11.06 sec with cane: 2.96 ft/sec on 03/24/22 ?Goal status: MET  ?  ?  ?  ?LONG TERM GOALS: Target date: 04/23/2022 ?  ?Pt will be independent with final HEP in order to build upon functional gains made in therapy. ?Baseline: No HEP yet ?Goal status: INITIAL ?  ?2.  Pt will improve FGA score to 25/30 in order to demonstrate improved balance and decreased fall risk. ?Baseline: 18/30 03/02/2022 ?Goal status: INITIAL ?  ?3.  Pt will improve gait speed with SPC vs. No AD to at least 3.0 ft/sec in order to demo improved community mobility.  ?Baseline: 2.11  ft/sec ? ?On 03/24/22: ?Gait speed w/ no AD: 14.31 = 2.29 ft/sec = limited community ambulator  ?11.06 sec with cane: 2.96 ft/sec = community ambulator  ?Goal status: INITIAL ?  ?4.  Pt will improve condition

## 2022-03-31 ENCOUNTER — Encounter: Payer: Self-pay | Admitting: Physical Therapy

## 2022-03-31 ENCOUNTER — Ambulatory Visit: Payer: 59 | Admitting: Physical Therapy

## 2022-03-31 DIAGNOSIS — R2681 Unsteadiness on feet: Secondary | ICD-10-CM | POA: Diagnosis not present

## 2022-03-31 DIAGNOSIS — R2689 Other abnormalities of gait and mobility: Secondary | ICD-10-CM | POA: Diagnosis not present

## 2022-03-31 DIAGNOSIS — M6281 Muscle weakness (generalized): Secondary | ICD-10-CM

## 2022-03-31 DIAGNOSIS — M25661 Stiffness of right knee, not elsewhere classified: Secondary | ICD-10-CM

## 2022-03-31 DIAGNOSIS — R262 Difficulty in walking, not elsewhere classified: Secondary | ICD-10-CM | POA: Diagnosis not present

## 2022-03-31 NOTE — Therapy (Signed)
?OUTPATIENT PHYSICAL THERAPY TREATMENT NOTE ? ? ?Patient Name: Christine Brady ?MRN: [id number] ?DOB:10-18-1961, 61 y.o., female ?Today's Date: 03/31/2022 ? ?PCP: Ronita Hipps, MD ?REFERRING PROVIDER: Ronita Hipps, MD ? ? PT End of Session - 03/31/22 0807   ? ? Visit Number 9   ? Number of Visits 17   ? Date for PT Re-Evaluation 05/27/22   ? Authorization Type [employer]   ? PT Start Time 0802   ? PT Stop Time 0844   ? PT Time Calculation (min) 42 min   ? Equipment Utilized During Treatment Gait belt   ? Activity Tolerance Patient tolerated treatment well   ? Behavior During Therapy [facility] for tasks assessed/performed   ? ?  ?  ? ?  ? ? ? ?Past Medical History:  ?Diagnosis Date  ? Abdominal pain   ? Arthritis   ? difficulty walking  ? Atypical chest pain 11/11/2016  ? Bowel obstruction ([place])   ? Cholesteatoma of right ear   ? Chronic headaches   ? 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 Cyst   ? back  ? Hearing aid worn   ? History of ITP   ? Hyperlipidemia 11/11/2016  ? Large ovary 07/09/2019  ? PONV (postoperative nausea and vomiting)   ? Reflux   ? ?Past Surgical History:  ?Procedure Laterality Date  ? CHOLECYSTECTOMY  2010  ? ESOPHAGOGASTRODUODENOSCOPY (EGD) WITH PROPOFOL N/A 06/12/2019  ? Procedure: ESOPHAGOGASTRODUODENOSCOPY (EGD) WITH PROPOFOL;  Surgeon: Carol Ada, MD;  [facility];  Service: Endoscopy;  Laterality: N/A;  ? EXTERNAL EAR SURGERY  2009/2010  ? EXTERNAL EAR SURGERY  2010  ? right ear  ? IR FLUORO GUIDE CV LINE RIGHT  02/10/2022  ? IR US GUIDE VASC ACCESS RIGHT  02/10/2022  ? KNEE ARTHROSCOPY    ? right  ? KNEE ARTHROSCOPY WITH MEDIAL PATELLAR FEMORAL LIGAMENT RECONSTRUCTION Right 10/05/2021  ? Procedure: right knee arthroscoy, debridement, lateral menisectomy, medial patellar femoral ligament reconstruction;  Surgeon: Meredith Pel, MD;  Location: [place];  Service: Orthopedics;  Laterality: Right;  ? KNEE LIGAMENT RECONSTRUCTION     ? left  ? MASTOIDECTOMY Right 01/19/2017  ? Procedure: RIGHT MODIFIED RADICAL MASTOIDECTOMY;  Surgeon: Vicie Mutters, MD;  Location: [place];  Service: ENT;  Laterality: Right;  ? UPPER ESOPHAGEAL ENDOSCOPIC ULTRASOUND (EUS) N/A 06/12/2019  ? Procedure: UPPER ESOPHAGEAL ENDOSCOPIC ULTRASOUND (EUS);  Surgeon: Carol Ada, MD;  [facility];  Service: Endoscopy;  Laterality: N/A;  ? WISDOM TOOTH EXTRACTION    ? ?Patient Active Problem List  ? Diagnosis Date Noted  ? UTI (urinary tract infection) 02/19/2022  ? 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 DVT (deep venous thrombosis) ([place]) 02/09/2022  ? Deep venous thrombosis of lower extremity ([place]) 12/14/2021  ? Patellar instability of right knee   ? Complex tear of lateral meniscus of right knee   ? Loose body in knee, right knee   ? Constipation 06/05/2021  ? 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 Family history of other diseases of the digestive system 06/05/2021  ? Nausea 06/05/2021  ? Otalgia, left 06/05/2021  ? Right upper quadrant pain 06/05/2021  ? Acute lateral meniscus tear of right knee 06/05/2021  ? Patellar dislocation, right, sequela 06/05/2021  ? 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 History of right mastoidectomy 04/10/2020  ? Postop check 02/29/2020  ? Bowel obstruction ([place]) 09/28/2019  ? Idiopathic thrombocytopenia ([place]) 09/28/2019  ?  Eustachian tube dysfunction, bilateral 09/28/2019  ? Tinnitus aurium, bilateral 09/28/2019  ? 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 Unspecified cholesteatoma, left ear 09/28/2019  ? IBS (irritable bowel syndrome) 07/09/2019  ? Headache 07/09/2019  ? GERD (gastroesophageal reflux disease) 07/09/2019  ? 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 Menopausal syndrome 03/13/2018  ? History of postoperative nausea 01/19/2017  ? Atypical chest pain  11/11/2016  ? 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 Epidermoid cyst on back 10/28/2011  ? ? 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 ?THERAPY DIAG:  ?Other abnormalities of gait and mobility ? ?Unsteadiness on feet ? ?Stiffness of right knee, not elsewhere classified ? ?Muscle weakness (generalized) ? ?PERTINENT HISTORY: GBS, knee arthroscopy with lateral menisectomy 10/05/2021, HLD, cochlear implant, chronic headaches. ? ?PRECAUTIONS: Fall ? ?SUBJECTIVE: continuing to use Treadmill at work. On Monday evening started having pain across lower back and abdomen like what she had with her last UTI. When she woke up Tuesday morning it was gone. Came back this morning with getting up. Has an appt with her MD on Tuesday of next week. See's the Ortho MD on Monday about the "bulge" that keeps occurring at the back of her knee (popliteal fossa) that comes and goes. Does not want a knee manipulation since her range of motion is improving.  ? ?PAIN:  ?Are you having pain? Yes: NPRS scale: 1/10 ?Pain location: R knee ?Pain description: sore ?Aggravating factors: bending ?Relieving factors: ice, rest, elevation ? ?PAIN:  ?Are you having pain? Yes ?NPRS scale: 8-9/10 ?Pain location: across lower back ?Pain orientation: Lower  ?PAIN TYPE: acute ?Pain description: constant, dull, and pressure   ?Aggravating factors: unsure, had this pain before with an UTI ?Relieving factors: unsure  ? ? ? ? ?TODAY'S TREATMENT: ?03/31/22: ?STRENGTHENING  ?Scifit level 4.5 x 8 minutes with goal >/= 60 steps per minute for strengthening and activity tolerance  ? ?BALANCE/NMR: ?Rockerboard: performed both ways on balance board with no UE support- rocking the board with EO, progressing to EC. Then holding the board steady for EC 30 seconds x 3 reps,  progressing to EC head movements left<>right, up<>down for ~10 reps each. Min guard to min assist for balance with cues on posture and weight shifting to assist with balance.    ?BOSU blue side up<>8 inch step: forward step up onto center of BOSU with contralateral heel tap to step for 10 reps each side with light support on bars, min guard assist for safety. No buckling of knee noted in stance. ?BOSU blue side up: alternating forward step ups with contralateral march for 10 reps each side with min guard assist, light support on bars for safety. No buckling of knee noted.  ? ?  ?PATIENT EDUCATION: ?Education details: Progress with knee ROM, edu on resting and not exercising every day of the week to see how this affects edema, edu on walking familiar short level distances without cane.  ?Person educated: Patient ?Education method: Explanation; demonstration; handout ?Education comprehension: verbalized understanding ?  ?  ?HOME EXERCISE PROGRAM: ?Access Code: [email or web address] ? ?  ?  ?GOALS: ?Goals reviewed with patient? Yes ?  ?SHORT TERM GOALS: Target date: 03/26/2022 ?  ?Pt will be independent with initial HEP in order to build upon functional  gains made in therapy. ?Baseline: Compliant to HEP. ?Goal status: MET ?  ?2.  Pt will undergo further assessment of FGA with LTG written. ?Baseline: assessed 03/02/2022 ?Goal status: MET ?  ?3.  Pt will improve AROM of R knee to at least 125 deg in order to demo improved ROM for gait.  ?Baseline: 125 deg; 129 deg 03/26/2022  ?Goal status: MET ?  ?4.  Pt will perform TUG in 13.5 sec or less with no AD in order to demo decr fall risk.  ?Baseline: 16.28 sec; 10.09 sec with no AD ?Goal status: MET ?  ?5.  Pt will improve gait speed with SPC vs. No AD to at least 2.5 ft/sec in order to demo improved community mobility.  ?  ?Baseline: 2.11 ft/sec with SPC, 11.06 sec with cane: 2.96 ft/sec on 03/24/22 ?Goal status: MET  ?  ?  ?  ?LONG TERM GOALS: Target date: 04/23/2022 ?  ?Pt will be  independent with final HEP in order to build upon functional gains made in therapy. ?Baseline: No HEP yet ?Goal status: INITIAL ?  ?2.  Pt will improve FGA score to 25/30 in order to demonstrate improved balance and d

## 2022-04-01 ENCOUNTER — Other Ambulatory Visit (HOSPITAL_COMMUNITY): Payer: Self-pay

## 2022-04-01 MED ORDER — ROSUVASTATIN CALCIUM 5 MG PO TABS
5.0000 mg | ORAL_TABLET | ORAL | 0 refills | Status: DC
  Filled 2022-04-01: qty 26, 90d supply, fill #0
  Filled 2022-07-08: qty 26, 90d supply, fill #1
  Filled 2022-10-01: qty 26, 90d supply, fill #2
  Filled 2023-01-26: qty 12, 42d supply, fill #3

## 2022-04-02 ENCOUNTER — Encounter: Payer: Self-pay | Admitting: Physical Therapy

## 2022-04-02 ENCOUNTER — Other Ambulatory Visit: Payer: Self-pay | Admitting: Orthopaedic Surgery

## 2022-04-02 ENCOUNTER — Ambulatory Visit: Payer: 59 | Admitting: Physical Therapy

## 2022-04-02 DIAGNOSIS — G8929 Other chronic pain: Secondary | ICD-10-CM

## 2022-04-02 DIAGNOSIS — R262 Difficulty in walking, not elsewhere classified: Secondary | ICD-10-CM | POA: Diagnosis not present

## 2022-04-02 DIAGNOSIS — R2681 Unsteadiness on feet: Secondary | ICD-10-CM | POA: Diagnosis not present

## 2022-04-02 DIAGNOSIS — R2689 Other abnormalities of gait and mobility: Secondary | ICD-10-CM

## 2022-04-02 DIAGNOSIS — M6281 Muscle weakness (generalized): Secondary | ICD-10-CM

## 2022-04-02 DIAGNOSIS — M25661 Stiffness of right knee, not elsewhere classified: Secondary | ICD-10-CM | POA: Diagnosis not present

## 2022-04-02 DIAGNOSIS — S83004S Unspecified dislocation of right patella, sequela: Secondary | ICD-10-CM

## 2022-04-02 NOTE — Therapy (Signed)
?OUTPATIENT PHYSICAL THERAPY TREATMENT NOTE ? ? ?Patient Name: Christine Brady ?MRN: [id number] ?DOB:05/16/1961, 61 y.o., female ?Today's Date: 04/02/2022 ? ?PCP: Ronita Hipps, MD ?REFERRING PROVIDER: Ronita Hipps, MD ? ? PT End of Session - 04/02/22 1538   ? ? Visit Number 10   ? Number of Visits 17   ? Date for PT Re-Evaluation 05/27/22   ? Authorization Type [employer]   ? PT Start Time 1536   ? PT Stop Time 1615   ? PT Time Calculation (min) 39 min   ? Equipment Utilized During Treatment Gait belt   ? Activity Tolerance Patient tolerated treatment well   ? Behavior During Therapy [facility] for tasks assessed/performed   ? ?  ?  ? ?  ? ? ? ?Past Medical History:  ?Diagnosis Date  ? Abdominal pain   ? Arthritis   ? difficulty walking  ? Atypical chest pain 11/11/2016  ? Bowel obstruction ([place])   ? Cholesteatoma of right ear   ? Chronic headaches   ? 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 Cyst   ? back  ? Hearing aid worn   ? History of ITP   ? Hyperlipidemia 11/11/2016  ? Large ovary 07/09/2019  ? PONV (postoperative nausea and vomiting)   ? Reflux   ? ?Past Surgical History:  ?Procedure Laterality Date  ? CHOLECYSTECTOMY  2010  ? ESOPHAGOGASTRODUODENOSCOPY (EGD) WITH PROPOFOL N/A 06/12/2019  ? Procedure: ESOPHAGOGASTRODUODENOSCOPY (EGD) WITH PROPOFOL;  Surgeon: Carol Ada, MD;  [facility];  Service: Endoscopy;  Laterality: N/A;  ? EXTERNAL EAR SURGERY  2009/2010  ? EXTERNAL EAR SURGERY  2010  ? right ear  ? IR FLUORO GUIDE CV LINE RIGHT  02/10/2022  ? IR US GUIDE VASC ACCESS RIGHT  02/10/2022  ? KNEE ARTHROSCOPY    ? right  ? KNEE ARTHROSCOPY WITH MEDIAL PATELLAR FEMORAL LIGAMENT RECONSTRUCTION Right 10/05/2021  ? Procedure: right knee arthroscoy, debridement, lateral menisectomy, medial patellar femoral ligament reconstruction;  Surgeon: Meredith Pel, MD;  Location: [place];  Service: Orthopedics;  Laterality: Right;  ? KNEE LIGAMENT RECONSTRUCTION     ? left  ? MASTOIDECTOMY Right 01/19/2017  ? Procedure: RIGHT MODIFIED RADICAL MASTOIDECTOMY;  Surgeon: Vicie Mutters, MD;  Location: [place];  Service: ENT;  Laterality: Right;  ? UPPER ESOPHAGEAL ENDOSCOPIC ULTRASOUND (EUS) N/A 06/12/2019  ? Procedure: UPPER ESOPHAGEAL ENDOSCOPIC ULTRASOUND (EUS);  Surgeon: Carol Ada, MD;  [facility];  Service: Endoscopy;  Laterality: N/A;  ? WISDOM TOOTH EXTRACTION    ? ?Patient Active Problem List  ? Diagnosis Date Noted  ? UTI (urinary tract infection) 02/19/2022  ? 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 DVT (deep venous thrombosis) ([place]) 02/09/2022  ? Deep venous thrombosis of lower extremity ([place]) 12/14/2021  ? Patellar instability of right knee   ? Complex tear of lateral meniscus of right knee   ? Loose body in knee, right knee   ? Constipation 06/05/2021  ? 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 Family history of other diseases of the digestive system 06/05/2021  ? Nausea 06/05/2021  ? Otalgia, left 06/05/2021  ? Right upper quadrant pain 06/05/2021  ? Acute lateral meniscus tear of right knee 06/05/2021  ? Patellar dislocation, right, sequela 06/05/2021  ? 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 History of right mastoidectomy 04/10/2020  ? Postop check 02/29/2020  ? Bowel obstruction ([place]) 09/28/2019  ? Idiopathic thrombocytopenia ([place]) 09/28/2019  ?  Eustachian tube dysfunction, bilateral 09/28/2019  ? Tinnitus aurium, bilateral 09/28/2019  ? 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 Unspecified cholesteatoma, left ear 09/28/2019  ? IBS (irritable bowel syndrome) 07/09/2019  ? Headache 07/09/2019  ? GERD (gastroesophageal reflux disease) 07/09/2019  ? 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 Menopausal syndrome 03/13/2018  ? History of postoperative nausea 01/19/2017  ? Atypical chest pain  11/11/2016  ? 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 Epidermoid cyst on back 10/28/2011  ? ? 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 ?THERAPY DIAG:  ?Other abnormalities of gait and mobility ? ?Unsteadiness on feet ? ?Stiffness of right knee, not elsewhere classified ? ?Muscle weakness (generalized) ? ?PERTINENT HISTORY: GBS, knee arthroscopy with lateral menisectomy 10/05/2021, HLD, cochlear implant, chronic headaches. ? ?PRECAUTIONS: Fall ? ?SUBJECTIVE: She sees the ortho MD Monday 4/24 and sees the PCP about recurrent kidney issue for referral to urology on 4/26.  States kidney issue has been intermittent since February of 2023.   ? ?PAIN:  ?Are you having pain? No ? ? ?TODAY'S TREATMENT: ?Gait outside on concrete sidewalk up and down incline and on level concrete x300' no AD supervision level using step through gait with good stride and heel-to-toe pattern.  Progressed to ambulation in grass x200' no AD SBA.  Pt cued to widen BOS to prevent lateral sway.  Progressed to backwards walking x150' in grass and side stepping x25' each direction in grass no AD SBA-CGA.  Pt ambulates over gravel 4x8' SBA.  No LOB during gait training. ?In // bars pt stands on 2" step performing heel taps x15 each LE using BUE palm support on bars ?Pt performing forward lunge with RLE and trunk rotation to left to rebounder w/ 1kg weighted ball x15 ?No buckling of R knee noted during all activity. ?  ?PATIENT EDUCATION: ?Education details: Edu on walking outside without AD and safety w/ inc visual reliance as needed initially. ?Person educated: Patient ?Education method: Explanation; demonstration; handout ?Education comprehension: verbalized understanding ?  ?  ?HOME EXERCISE PROGRAM: ?Access Code: [email or web address] ? ?  ?  ?GOALS: ?Goals reviewed  with patient? Yes ?  ?SHORT TERM GOALS: Target date: 03/26/2022 ?  ?Pt will be independent with initial HEP in order to build upon functional gains made in therapy. ?Baseline: Compliant to HEP. ?Goal status: MET ?  ?2.  Pt will undergo further assessment of FGA with LTG written. ?Baseline: assessed 03/02/2022 ?Goal status: MET ?  ?3.  Pt will improve AROM of R knee to at least 125 deg in order to demo improved ROM for gait.  ?Baseline: 125 deg; 129 deg 03/26/2022  ?Goal status: MET ?  ?4.  Pt will perform TUG in 13.5 sec or less with no AD in order to demo decr fall risk.  ?Baseline: 16.28 sec; 10.09 sec with no AD ?Goal status: MET ?  ?5.  Pt will improve gait speed with SPC vs. No AD to at least 2.5 ft/sec in order to demo improved community mobility.  ?  ?Baseline: 2.11 ft/sec with SPC, 11.06 sec with cane: 2.96 ft/sec on 03/24/22 ?Goal status: MET  ?  ?  ?  ?LONG TERM GOALS: Target date: 04/23/2022 ?  ?Pt will be independent with final HEP in order to  build upon functional gains made in therapy. ?Baseline: No HEP yet ?Goal status: INITIAL ?  ?2.  Pt will improve FGA score to 25/30 in order to demonstrate improved balance and decreased fall risk. ?Baseline: 18/30 03/02/2022 ?Goal status: INITIAL ?  ?3.  Pt will improve gait speed with SPC vs. No AD to at least 3.0 ft/sec in order to demo improved community mobility.  ?Baseline: 2.11 ft/sec ? ?On 03/24/22: ?Gait speed w/ no AD: 14.31 = 2.29 ft/sec = limited community ambulator  ?11.06 sec with cane: 2.96 ft/sec = community ambulator  ?Goal status: INITIAL ?  ?4.  Pt will improve condition 4 of mCTSIB to at least 25 seconds in order to demo improved vestibular input for balance  ?Baseline: 12.3 seconds ?Goal status: INITIAL ?  ?5.  Pt will ambulate at least 500' over unlevel outdoor surfaces with supervision and LRAD vs. No AD in order to demo improved community mobility. ?Baseline: not yet assessed. ?Goal status: INITIAL  ?  ?  ?ASSESSMENT: ?  ?CLINICAL IMPRESSION: ?Pt  continues to express concern for bending the R knee w/ emphasis on thinking it is related to her fear of falling and not being able to recover.  Session focused on addressed eccentric strengthening of

## 2022-04-05 ENCOUNTER — Ambulatory Visit: Payer: 59 | Admitting: Orthopedic Surgery

## 2022-04-05 ENCOUNTER — Encounter: Payer: Self-pay | Admitting: Surgical

## 2022-04-05 ENCOUNTER — Ambulatory Visit (INDEPENDENT_AMBULATORY_CARE_PROVIDER_SITE_OTHER): Payer: 59 | Admitting: Surgical

## 2022-04-05 DIAGNOSIS — S83004S Unspecified dislocation of right patella, sequela: Secondary | ICD-10-CM

## 2022-04-05 NOTE — Progress Notes (Signed)
? ?Post-Op Visit Note ?  ?Patient: Christine Brady           ?Date of Birth: [birth year]           ?MRN: [id number] ?Visit Date: 04/05/2022 ?PCP: Ronita Hipps, MD ? ? ?Assessment & Plan: ? ?Chief Complaint:  ?Chief Complaint  ?Patient presents with  ? Right Knee - Follow-up  ? ?Visit Diagnoses:  ?1. Patellar dislocation, right, sequela   ? ? ?Plan: Patient is a 61 year old female who presents s/p right knee arthroscopy with partial lateral meniscectomy and MPFL reconstruction on 10/01/2021.  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  She was treated with plasmapheresis and spent about 10 days in the hospital.  Since then, her bilateral foot numbness and tingling has significantly improved.  The pain in her legs is much better.  She has been working with physical therapy, focusing on regaining her strength after she had a lot of weakness throughout bilateral legs from the [place]? syndrome.  She has had no incidence of patellar instability. ? ?On exam, patient has 0 degrees extension and about 125 degrees of knee flexion.  No calf tenderness.  Negative Homans' sign.  No tenderness over the patella.  Excellent stability to stressing the MPFL.  No significant coarse grinding or crepitus noted with passive motion of the knee.  No significant joint line tenderness.  Intact ankle dorsiflexion, EHL, plantarflexion, hamstring strength, quadricep, hip flexion with 5 -/5 hamstring strength.  Trace effusion on exam today ? ?Plan is continue with strengthening in physical therapy.  She has not plateaued in regards to regaining her strength.  She is having some increased rash formation on the calves of both legs, worse on the right leg.  She had this rash prior to the onset of her Guillain-Barr? syndrome; recommended she reach out to her neurologist if she notices increased numbness/tingling or increasing weakness.  Follow-up with  Dr. Marlou Sa for final check regarding her right knee in 2 months. ? ?Follow-Up Instructions: No follow-ups on file.  ? ?Orders:  ?No orders of the defined types were placed in this encounter. ? ?No orders of the defined types were placed in this encounter. ? ? ?Imaging: ?No results found. ? ?PMFS History: ?Patient Active Problem List  ? Diagnosis Date Noted  ? UTI (urinary tract infection) 02/19/2022  ? 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 DVT (deep venous thrombosis) ([place]) 02/09/2022  ? Deep venous thrombosis of lower extremity ([place]) 12/14/2021  ? Patellar instability of right knee   ? Complex tear of lateral meniscus of right knee   ? Loose body in knee, right knee   ? Constipation 06/05/2021  ? 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 Family history of other diseases of the digestive system 06/05/2021  ? Nausea 06/05/2021  ? Otalgia, left 06/05/2021  ? Right upper quadrant pain 06/05/2021  ? Acute lateral meniscus tear of right knee 06/05/2021  ? Patellar dislocation, right, sequela 06/05/2021  ? 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 History of right mastoidectomy 04/10/2020  ? Postop check 02/29/2020  ? Bowel obstruction ([place]) 09/28/2019  ? Idiopathic thrombocytopenia ([place]) 09/28/2019  ? Eustachian tube dysfunction, bilateral 09/28/2019  ? Tinnitus aurium, bilateral 09/28/2019  ? 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 Unspecified cholesteatoma, left ear 09/28/2019  ? IBS (irritable bowel syndrome) 07/09/2019  ? Headache 07/09/2019  ? GERD (gastroesophageal reflux disease) 07/09/2019  ?  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 Menopausal syndrome 03/13/2018  ? History of postoperative nausea 01/19/2017  ? Atypical chest pain 11/11/2016  ? 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 Epidermoid cyst on back 10/28/2011  ? ?Past Medical History:   ?Diagnosis Date  ? Abdominal pain   ? Arthritis   ? difficulty walking  ? Atypical chest pain 11/11/2016  ? Bowel obstruction ([place])   ? Cholesteatoma of right ear   ? Chronic headaches   ? 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 Cyst   ? back  ? Hearing aid worn   ? History of ITP   ? Hyperlipidemia 11/11/2016  ? Large ovary 07/09/2019  ? PONV (postoperative nausea and vomiting)   ? Reflux   ?  ?Family History  ?Problem Relation Age of Onset  ? Diabetes Mother   ? Asthma Mother   ? Diabetes type II Mother   ? Alcohol abuse Mother   ? Hypertension Mother   ? Diabetes Father   ? Hypertension Father   ? Kidney disease Father   ? Diabetes type II Father   ? Alcohol abuse Father   ? Crohn's disease Sister   ? COPD Sister   ? Thrombocytopenia Sister   ? Cerebral palsy Brother   ? Neuropathy Neg Hx   ?  ?Past Surgical History:  ?Procedure Laterality Date  ? CHOLECYSTECTOMY  2010  ? ESOPHAGOGASTRODUODENOSCOPY (EGD) WITH PROPOFOL N/A 06/12/2019  ? Procedure: ESOPHAGOGASTRODUODENOSCOPY (EGD) WITH PROPOFOL;  Surgeon: Carol Ada, MD;  [facility];  Service: Endoscopy;  Laterality: N/A;  ? EXTERNAL EAR SURGERY  2009/2010  ? EXTERNAL EAR SURGERY  2010  ? right ear  ? IR FLUORO GUIDE CV LINE RIGHT  02/10/2022  ? IR US GUIDE VASC ACCESS RIGHT  02/10/2022  ? KNEE ARTHROSCOPY    ? right  ? KNEE ARTHROSCOPY WITH MEDIAL PATELLAR FEMORAL LIGAMENT RECONSTRUCTION Right 10/05/2021  ? Procedure: right knee arthroscoy, debridement, lateral menisectomy, medial patellar femoral ligament reconstruction;  Surgeon: Meredith Pel, MD;  Location: [place];  Service: Orthopedics;  Laterality: Right;  ? KNEE LIGAMENT RECONSTRUCTION    ? left  ? MASTOIDECTOMY Right 01/19/2017  ? Procedure: RIGHT MODIFIED RADICAL MASTOIDECTOMY;  Surgeon: Vicie Mutters, MD;  Location: [place];  Service: ENT;  Laterality: Right;  ? UPPER ESOPHAGEAL ENDOSCOPIC ULTRASOUND (EUS) N/A 06/12/2019  ? Procedure:  UPPER ESOPHAGEAL ENDOSCOPIC ULTRASOUND (EUS);  Surgeon: Carol Ada, MD;  [facility];  Service: Endoscopy;  Laterality: N/A;  ? WISDOM TOOTH EXTRACTION    ? ?Social History  ? ?Occupational History  ? Not on file  ?Tobacco Use  ? Smoking status: Never  ? Smokeless tobacco: Never  ?Vaping Use  ? Vaping Use: Never used  ?Substance and Sexual Activity  ? Alcohol use: Yes  ?  Comment: occas  ? Drug use: No  ? Sexual activity: Not on file  ? ? ? ?

## 2022-04-07 ENCOUNTER — Encounter: Payer: Self-pay | Admitting: Physical Therapy

## 2022-04-07 ENCOUNTER — Other Ambulatory Visit (HOSPITAL_COMMUNITY): Payer: Self-pay

## 2022-04-07 ENCOUNTER — Ambulatory Visit: Payer: 59 | Admitting: Physical Therapy

## 2022-04-07 DIAGNOSIS — N3090 Cystitis, unspecified without hematuria: Secondary | ICD-10-CM | POA: Diagnosis not present

## 2022-04-07 DIAGNOSIS — R2689 Other abnormalities of gait and mobility: Secondary | ICD-10-CM | POA: Diagnosis not present

## 2022-04-07 DIAGNOSIS — R2681 Unsteadiness on feet: Secondary | ICD-10-CM | POA: Diagnosis not present

## 2022-04-07 DIAGNOSIS — R300 Dysuria: Secondary | ICD-10-CM | POA: Diagnosis not present

## 2022-04-07 DIAGNOSIS — R262 Difficulty in walking, not elsewhere classified: Secondary | ICD-10-CM | POA: Diagnosis not present

## 2022-04-07 DIAGNOSIS — M6281 Muscle weakness (generalized): Secondary | ICD-10-CM | POA: Diagnosis not present

## 2022-04-07 DIAGNOSIS — M25661 Stiffness of right knee, not elsewhere classified: Secondary | ICD-10-CM

## 2022-04-07 DIAGNOSIS — Z6825 Body mass index (BMI) 25.0-25.9, adult: Secondary | ICD-10-CM | POA: Diagnosis not present

## 2022-04-07 MED ORDER — URIBEL 118 MG PO CAPS
ORAL_CAPSULE | ORAL | 0 refills | Status: DC
  Filled 2022-04-07: qty 56, 14d supply, fill #0

## 2022-04-07 NOTE — Therapy (Signed)
?OUTPATIENT PHYSICAL THERAPY TREATMENT NOTE ? ? ?Patient Name: Christine Brady ?MRN: [id number] ?DOB:1961/06/17, 61 y.o., female ?Today's Date: 04/07/2022 ? ?PCP: Ronita Hipps, MD ?REFERRING PROVIDER: Melvenia Beam, MD ? ? PT End of Session - 04/07/22 0803   ? ? Visit Number 11   ? Number of Visits 17   ? Date for PT Re-Evaluation 05/27/22   ? Authorization Type [employer]   ? PT Start Time 0802   ? PT Stop Time [phone]   ? PT Time Calculation (min) 41 min   ? Equipment Utilized During Treatment Gait belt   ? Activity Tolerance Patient tolerated treatment well   ? Behavior During Therapy [facility] for tasks assessed/performed   ? ?  ?  ? ?  ? ? ? ?Past Medical History:  ?Diagnosis Date  ? Abdominal pain   ? Arthritis   ? difficulty walking  ? Atypical chest pain 11/11/2016  ? Bowel obstruction ([place])   ? Cholesteatoma of right ear   ? Chronic headaches   ? 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 Cyst   ? back  ? Hearing aid worn   ? History of ITP   ? Hyperlipidemia 11/11/2016  ? Large ovary 07/09/2019  ? PONV (postoperative nausea and vomiting)   ? Reflux   ? ?Past Surgical History:  ?Procedure Laterality Date  ? CHOLECYSTECTOMY  2010  ? ESOPHAGOGASTRODUODENOSCOPY (EGD) WITH PROPOFOL N/A 06/12/2019  ? Procedure: ESOPHAGOGASTRODUODENOSCOPY (EGD) WITH PROPOFOL;  Surgeon: Carol Ada, MD;  Location: [facility];  Service: Endoscopy;  Laterality: N/A;  ? EXTERNAL EAR SURGERY  2009/2010  ? EXTERNAL EAR SURGERY  2010  ? right ear  ? IR FLUORO GUIDE CV LINE RIGHT  02/10/2022  ? IR US GUIDE VASC ACCESS RIGHT  02/10/2022  ? KNEE ARTHROSCOPY    ? right  ? KNEE ARTHROSCOPY WITH MEDIAL PATELLAR FEMORAL LIGAMENT RECONSTRUCTION Right 10/05/2021  ? Procedure: right knee arthroscoy, debridement, lateral menisectomy, medial patellar femoral ligament reconstruction;  Surgeon: Meredith Pel, MD;  Location: [place];  Service: Orthopedics;  Laterality: Right;  ? KNEE LIGAMENT  RECONSTRUCTION    ? left  ? MASTOIDECTOMY Right 01/19/2017  ? Procedure: RIGHT MODIFIED RADICAL MASTOIDECTOMY;  Surgeon: Vicie Mutters, MD;  Location: [place];  Service: ENT;  Laterality: Right;  ? UPPER ESOPHAGEAL ENDOSCOPIC ULTRASOUND (EUS) N/A 06/12/2019  ? Procedure: UPPER ESOPHAGEAL ENDOSCOPIC ULTRASOUND (EUS);  Surgeon: Carol Ada, MD;  Location: [facility];  Service: Endoscopy;  Laterality: N/A;  ? WISDOM TOOTH EXTRACTION    ? ?Patient Active Problem List  ? Diagnosis Date Noted  ? UTI (urinary tract infection) 02/19/2022  ? 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 DVT (deep venous thrombosis) ([place]) 02/09/2022  ? Deep venous thrombosis of lower extremity ([place]) 12/14/2021  ? Patellar instability of right knee   ? Complex tear of lateral meniscus of right knee   ? Loose body in knee, right knee   ? Constipation 06/05/2021  ? 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 Family history of other diseases of the digestive system 06/05/2021  ? Nausea 06/05/2021  ? Otalgia, left 06/05/2021  ? Right upper quadrant pain 06/05/2021  ? Acute lateral meniscus tear of right knee 06/05/2021  ? Patellar dislocation, right, sequela 06/05/2021  ? 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 History of right mastoidectomy 04/10/2020  ? Postop check 02/29/2020  ? Bowel obstruction ([place]) 09/28/2019  ? Idiopathic thrombocytopenia ([place]) 09/28/2019  ?  Eustachian tube dysfunction, bilateral 09/28/2019  ? Tinnitus aurium, bilateral 09/28/2019  ? 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 Unspecified cholesteatoma, left ear 09/28/2019  ? IBS (irritable bowel syndrome) 07/09/2019  ? Headache 07/09/2019  ? GERD (gastroesophageal reflux disease) 07/09/2019  ? 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 Menopausal syndrome 03/13/2018  ? History of postoperative nausea 01/19/2017  ?  Atypical chest pain 11/11/2016  ? 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 Epidermoid cyst on back 10/28/2011  ? ? 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 ?THERAPY DIAG:  ?Other abnormalities of gait and mobility ? ?Stiffness of right knee, not elsewhere classified ? ?Unsteadiness on feet ? ?Muscle weakness (generalized) ? ?PERTINENT HISTORY: GBS, knee arthroscopy with lateral menisectomy 10/05/2021, HLD, cochlear implant, chronic headaches. ? ?PRECAUTIONS: Fall ? ?SUBJECTIVE: Walked on the treadmill the other day at 3.0 for 15 minutes. Saw the orthopedic recently and states that they are pleased with her ROM and to keep working on strength in PT. Will follow up with pt in 2 months.  ? ?PAIN:  ?Are you having pain? No ? ? ?TODAY'S TREATMENT: ?Elliptical with BUE support at gear 1.0; forwards x3 minutes, backwards x2 minutes. Pt fatigued more easily going backwards. Pt with incr fear initially with bending her RLE in the forwards direction, but improved with incr time. Pt asking if she could do this in her work gym, discussed will continue w/ treadmill for now until pt gets more practice with the elliptical as it can be difficult to get on and off.  ?Eccentric step downs from 4" step- 2 sets of 10 with RLE and tapping LLE to floor, UE support > none ?Forward step up to 4" step with RLE and marching contralateral LLE 2 x 10 reps, focus on slowed and controlled and for additional SLS time on RLE, no UE support ?Side stepping down and back in // bars x3 reps w/ use of blue tband around ankles, cues for mini squat position ?Forward/retro monster walks down and back x2 reps with blue tband around ankles for hip/quad strengthening.  ? ? ? ?Floor transfers for fall recovery w/ red mat on the floor; pt getting down on floor with  descending with LLE first into half kneel position with RLE forwards. Pt able to then sit on the mat and then get into quadruped position, had pt bring RLE forwards into half kneel. Initially cued for LLE forwards, but pt reporting that she could not keep her RLE back, with pt using UE support able to push to stand from half kneel position. Performed x3 reps, then performed x2 reps of pt standing from half kneel and using hands to push off from the floor to stand w/ supervision. Initial cues for proper technique. Discussed pt having her phone on her at all times if she is outside as that is where pt is afraid that she might have a fall. ?  ?PATIENT EDUCATION: ?Education details: Fall recovery.  ?Person educated: Patient ?Education method: Explanation; demonstration; handout ?Education comprehension: verbalized understanding ?  ?  ?HOME EXERCISE PROGRAM: ?Access Code: [email or web address] ? ?  ?  ?GOALS: ?Goals reviewed with patient? Yes ?  ?SHORT TERM GOALS: Target date: 03/26/2022 ?  ?Pt will be independent with initial HEP in order  to build upon functional gains made in therapy. ?Baseline: Compliant to HEP. ?Goal status: MET ?  ?2.  Pt will undergo further assessment of FGA with LTG written. ?Baseline: assessed 03/02/2022 ?Goal status: MET ?  ?3.  Pt will improve AROM of R knee to at least 125 deg in order to demo improved ROM for gait.  ?Baseline: 125 deg; 129 deg 03/26/2022  ?Goal status: MET ?  ?4.  Pt will perform TUG in 13.5 sec or less with no AD in order to demo decr fall risk.  ?Baseline: 16.28 sec; 10.09 sec with no AD ?Goal status: MET ?  ?5.  Pt will improve gait speed with SPC vs. No AD to at least 2.5 ft/sec in order to demo improved community mobility.  ?  ?Baseline: 2.11 ft/sec with SPC, 11.06 sec with cane: 2.96 ft/sec on 03/24/22 ?Goal status: MET  ?  ?  ?  ?LONG TERM GOALS: Target date: 04/23/2022 ?  ?Pt will be independent with final HEP in order to build upon functional gains made in therapy. ?Baseline: No HEP  yet ?Goal status: INITIAL ?  ?2.  Pt will improve FGA score to 25/30 in order to demonstrate improved balance and decreased fall risk. ?Baseline: 18/30 03/02/2022 ?Goal status: INITIAL ?  ?3.  Pt will improve gait speed

## 2022-04-09 ENCOUNTER — Ambulatory Visit: Payer: 59 | Admitting: Physical Therapy

## 2022-04-09 ENCOUNTER — Encounter: Payer: Self-pay | Admitting: Physical Therapy

## 2022-04-09 DIAGNOSIS — R2681 Unsteadiness on feet: Secondary | ICD-10-CM | POA: Diagnosis not present

## 2022-04-09 DIAGNOSIS — M6281 Muscle weakness (generalized): Secondary | ICD-10-CM

## 2022-04-09 DIAGNOSIS — M25661 Stiffness of right knee, not elsewhere classified: Secondary | ICD-10-CM | POA: Diagnosis not present

## 2022-04-09 DIAGNOSIS — R2689 Other abnormalities of gait and mobility: Secondary | ICD-10-CM

## 2022-04-09 DIAGNOSIS — R262 Difficulty in walking, not elsewhere classified: Secondary | ICD-10-CM

## 2022-04-09 NOTE — Therapy (Signed)
?OUTPATIENT PHYSICAL THERAPY TREATMENT NOTE ? ? ?Patient Name: Christine Brady ?MRN: [id number] ?DOB:Mar 30, 1961, 61 y.o., female ?Today's Date: 04/09/2022 ? ?PCP: Ronita Hipps, MD ?REFERRING PROVIDER: Ronita Hipps, MD ? ? PT End of Session - 04/09/22 0805   ? ? Visit Number 12   ? Number of Visits 17   ? Date for PT Re-Evaluation 05/27/22   ? Authorization Type [employer]   ? PT Start Time [phone]   ? PT Stop Time 0841   ? PT Time Calculation (min) 38 min   ? Equipment Utilized During Treatment Gait belt   ? Activity Tolerance Patient tolerated treatment well   ? Behavior During Therapy [facility] for tasks assessed/performed   ? ?  ?  ? ?  ? ? ? ?Past Medical History:  ?Diagnosis Date  ? Abdominal pain   ? Arthritis   ? difficulty walking  ? Atypical chest pain 11/11/2016  ? Bowel obstruction ([place])   ? Cholesteatoma of right ear   ? Chronic headaches   ? 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 Cyst   ? back  ? Hearing aid worn   ? History of ITP   ? Hyperlipidemia 11/11/2016  ? Large ovary 07/09/2019  ? PONV (postoperative nausea and vomiting)   ? Reflux   ? ?Past Surgical History:  ?Procedure Laterality Date  ? CHOLECYSTECTOMY  2010  ? ESOPHAGOGASTRODUODENOSCOPY (EGD) WITH PROPOFOL N/A 06/12/2019  ? Procedure: ESOPHAGOGASTRODUODENOSCOPY (EGD) WITH PROPOFOL;  Surgeon: Carol Ada, MD;  [facility];  Service: Endoscopy;  Laterality: N/A;  ? EXTERNAL EAR SURGERY  2009/2010  ? EXTERNAL EAR SURGERY  2010  ? right ear  ? IR FLUORO GUIDE CV LINE RIGHT  02/10/2022  ? IR US GUIDE VASC ACCESS RIGHT  02/10/2022  ? KNEE ARTHROSCOPY    ? right  ? KNEE ARTHROSCOPY WITH MEDIAL PATELLAR FEMORAL LIGAMENT RECONSTRUCTION Right 10/05/2021  ? Procedure: right knee arthroscoy, debridement, lateral menisectomy, medial patellar femoral ligament reconstruction;  Surgeon: Meredith Pel, MD;  Location: [place];  Service: Orthopedics;  Laterality: Right;  ? KNEE LIGAMENT RECONSTRUCTION     ? left  ? MASTOIDECTOMY Right 01/19/2017  ? Procedure: RIGHT MODIFIED RADICAL MASTOIDECTOMY;  Surgeon: Vicie Mutters, MD;  Location: [place];  Service: ENT;  Laterality: Right;  ? UPPER ESOPHAGEAL ENDOSCOPIC ULTRASOUND (EUS) N/A 06/12/2019  ? Procedure: UPPER ESOPHAGEAL ENDOSCOPIC ULTRASOUND (EUS);  Surgeon: Carol Ada, MD;  Location: [facility];  Service: Endoscopy;  Laterality: N/A;  ? WISDOM TOOTH EXTRACTION    ? ?Patient Active Problem List  ? Diagnosis Date Noted  ? UTI (urinary tract infection) 02/19/2022  ? 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 DVT (deep venous thrombosis) ([place]) 02/09/2022  ? Deep venous thrombosis of lower extremity ([place]) 12/14/2021  ? Patellar instability of right knee   ? Complex tear of lateral meniscus of right knee   ? Loose body in knee, right knee   ? Constipation 06/05/2021  ? 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 Family history of other diseases of the digestive system 06/05/2021  ? Nausea 06/05/2021  ? Otalgia, left 06/05/2021  ? Right upper quadrant pain 06/05/2021  ? Acute lateral meniscus tear of right knee 06/05/2021  ? Patellar dislocation, right, sequela 06/05/2021  ? 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 History of right mastoidectomy 04/10/2020  ? Postop check 02/29/2020  ? Bowel obstruction ([place]) 09/28/2019  ? Idiopathic thrombocytopenia ([place]) 09/28/2019  ?  Eustachian tube dysfunction, bilateral 09/28/2019  ? Tinnitus aurium, bilateral 09/28/2019  ? 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 Unspecified cholesteatoma, left ear 09/28/2019  ? IBS (irritable bowel syndrome) 07/09/2019  ? Headache 07/09/2019  ? GERD (gastroesophageal reflux disease) 07/09/2019  ? 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 Menopausal syndrome 03/13/2018  ? History of postoperative nausea 01/19/2017  ? Atypical chest pain  11/11/2016  ? 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 Epidermoid cyst on back 10/28/2011  ? ? 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) R27.0 (ICD-10-CM) - Ataxia R26.9 (ICD-10-CM) - Gait abnormality  ? ?THERAPY DIAG:  ?Other abnormalities of gait and mobility ? ?Unsteadiness on feet ? ?Muscle weakness (generalized) ? ?Difficulty in walking, not elsewhere classified ? ?PERTINENT HISTORY: GBS, knee arthroscopy with lateral menisectomy 10/05/2021, HLD, cochlear implant, chronic headaches. ? ?PRECAUTIONS: Fall ? ?SUBJECTIVE: No new complaints. Some soreness from riding the bike for 2 miles yesterday and then walking 15 minutes on the treadmill.  ? ?PAIN:  ?Are you having pain? No ? ? ?TODAY'S TREATMENT: ? 04/09/2022 ? STRENGTHENING  ? Elliptical level 1.0 forward/backward x 3 minutes each way with bil UE support. Min guard assist for safety ? Step downs: right stance on 4 inch step with left heel taps to floor for 2 sets of 10 reps, light support on bars.  ? ? BALANCE/NMR: ?Side Stepping: on blue foam beam in squat position for 4 laps toward each side with no UE support, min guard assist for safety.  ?Tandem Walking: on blue foam beam with heel taps to floor with each step forward/backward for 4 laps each way, light to no UE support on bars with min guard assist for safety.    ?BOSU (blue side up) alternating forward step up with contralateral march for 5 reps each side. Pt reporting increase right lateral knee pain and dizziness from not breathing correctly, therefore activity stopped. Dizziness resolved with seated rest break. ?Inverted BOSU- rocking laterally x 10 reps, then in anterior/posterior direction for 10 reps. Then mini squats x 10 reps. All with min guard assist for safety.  ? ?  ?PATIENT EDUCATION: ?Education details: continue with current home program ?Person educated:  Patient ?Education method: Explanation ?Education comprehension: verbalized understanding ?  ?  ?HOME EXERCISE PROGRAM: ?Access Code: [email or web address] ? ?  ?  ?GOALS: ?Goals reviewed with patient? Yes ?  ?SHORT TERM GOALS: Target date: 03/26/2022 ?  ?Pt will be independent with initial HEP in order to build upon functional gains made in therapy. ?Baseline: Compliant to HEP. ?Goal status: MET ?  ?2.  Pt will undergo further assessment of FGA with LTG written. ?Baseline: assessed 03/02/2022 ?Goal status: MET ?  ?3.  Pt will improve AROM of R knee to at least 125 deg in order to demo improved ROM for gait.  ?Baseline: 125 deg; 129 deg 03/26/2022  ?Goal status: MET ?  ?4.  Pt will perform TUG in 13.5 sec or less with no AD in order to demo decr fall risk.  ?Baseline: 16.28 sec; 10.09 sec with no AD ?Goal status: MET ?  ?5.  Pt will improve gait speed with SPC vs. No AD to at least 2.5 ft/sec in order to demo improved community mobility.  ?  ?Baseline: 2.11 ft/sec with SPC, 11.06 sec with cane: 2.96 ft/sec on  03/24/22 ?Goal status: MET  ?  ?  ?  ?LONG TERM GOALS: Target date: 04/23/2022 ?  ?Pt will be independent with final HEP in order to build upon functional gains made in therapy. ?Baseline: No HEP yet ?Goal status: INITIAL ?  ?2.  Pt will improve FGA score to 25/30 in order to demonstrate improved balance and decreased fall risk. ?Baseline: 18/30 03/02/2022 ?Goal status: INITIAL ?  ?3.  Pt will improve gait speed with SPC vs. No AD to at least 3.0 ft/sec in order to demo improved community mobility.  ?Baseline: 2.11 ft/sec ? ?On 03/24/22: ?Gait speed w/ no AD: 14.31 = 2.29 ft/sec = limited community ambulator  ?11.06 sec with cane: 2.96 ft/sec = community ambulator  ?Goal status: INITIAL ?  ?4.  Pt will improve condition 4 of mCTSIB to at least 25 seconds in order to demo improved vestibular input for balance  ?Baseline: 12.3 seconds ?Goal status: INITIAL ?  ?5.  Pt will ambulate at least 500' over unlevel outdoor surfaces with  supervision and LRAD vs. No AD in order to demo improved community mobility. ?Baseline: not yet assessed. ?Goal status: INITIAL  ?  ?  ?ASSESSMENT: ?  ?CLINICAL IMPRESSION: ?Skilled session continued to focus o

## 2022-04-12 ENCOUNTER — Other Ambulatory Visit (HOSPITAL_COMMUNITY): Payer: Self-pay

## 2022-04-12 ENCOUNTER — Ambulatory Visit: Payer: 59 | Admitting: Neurology

## 2022-04-12 MED ORDER — NITROFURANTOIN MONOHYD MACRO 100 MG PO CAPS
ORAL_CAPSULE | ORAL | 0 refills | Status: DC
  Filled 2022-04-12: qty 14, 7d supply, fill #0

## 2022-04-14 ENCOUNTER — Ambulatory Visit: Payer: 59 | Admitting: Physical Therapy

## 2022-04-15 ENCOUNTER — Encounter: Payer: Self-pay | Admitting: Physical Therapy

## 2022-04-15 ENCOUNTER — Ambulatory Visit: Payer: 59 | Attending: Neurology | Admitting: Physical Therapy

## 2022-04-15 DIAGNOSIS — R2681 Unsteadiness on feet: Secondary | ICD-10-CM | POA: Insufficient documentation

## 2022-04-15 DIAGNOSIS — M25661 Stiffness of right knee, not elsewhere classified: Secondary | ICD-10-CM | POA: Insufficient documentation

## 2022-04-15 DIAGNOSIS — M6281 Muscle weakness (generalized): Secondary | ICD-10-CM | POA: Diagnosis not present

## 2022-04-15 DIAGNOSIS — R262 Difficulty in walking, not elsewhere classified: Secondary | ICD-10-CM | POA: Diagnosis not present

## 2022-04-15 DIAGNOSIS — R2689 Other abnormalities of gait and mobility: Secondary | ICD-10-CM | POA: Insufficient documentation

## 2022-04-15 NOTE — Therapy (Signed)
?OUTPATIENT PHYSICAL THERAPY TREATMENT NOTE ? ? ?Patient Name: BINDI KLOMP ?MRN: [id number] ?DOB:1961/08/23, 61 y.o., female ?Today's Date: 04/15/2022 ? ?PCP: Ronita Hipps, MD ?REFERRING PROVIDER: Ronita Hipps, MD ? ? PT End of Session - 04/15/22 0805   ? ? Visit Number 13   ? Number of Visits 17   ? Date for PT Re-Evaluation 05/27/22   ? Authorization Type [employer]   ? PT Start Time 0802   ? PT Stop Time [phone]   ? PT Time Calculation (min) 41 min   ? Equipment Utilized During Treatment --   ? Activity Tolerance Patient tolerated treatment well   ? Behavior During Therapy [facility] for tasks assessed/performed   ? ?  ?  ? ?  ? ? ? ?Past Medical History:  ?Diagnosis Date  ? Abdominal pain   ? Arthritis   ? difficulty walking  ? Atypical chest pain 11/11/2016  ? Bowel obstruction ([place])   ? Cholesteatoma of right ear   ? Chronic headaches   ? 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 Cyst   ? back  ? Hearing aid worn   ? History of ITP   ? Hyperlipidemia 11/11/2016  ? Large ovary 07/09/2019  ? PONV (postoperative nausea and vomiting)   ? Reflux   ? ?Past Surgical History:  ?Procedure Laterality Date  ? CHOLECYSTECTOMY  2010  ? ESOPHAGOGASTRODUODENOSCOPY (EGD) WITH PROPOFOL N/A 06/12/2019  ? Procedure: ESOPHAGOGASTRODUODENOSCOPY (EGD) WITH PROPOFOL;  Surgeon: Carol Ada, MD;  Location: [facility];  Service: Endoscopy;  Laterality: N/A;  ? EXTERNAL EAR SURGERY  2009/2010  ? EXTERNAL EAR SURGERY  2010  ? right ear  ? IR FLUORO GUIDE CV LINE RIGHT  02/10/2022  ? IR US GUIDE VASC ACCESS RIGHT  02/10/2022  ? KNEE ARTHROSCOPY    ? right  ? KNEE ARTHROSCOPY WITH MEDIAL PATELLAR FEMORAL LIGAMENT RECONSTRUCTION Right 10/05/2021  ? Procedure: right knee arthroscoy, debridement, lateral menisectomy, medial patellar femoral ligament reconstruction;  Surgeon: Meredith Pel, MD;  Location: [place];  Service: Orthopedics;  Laterality: Right;  ? KNEE LIGAMENT RECONSTRUCTION    ?  left  ? MASTOIDECTOMY Right 01/19/2017  ? Procedure: RIGHT MODIFIED RADICAL MASTOIDECTOMY;  Surgeon: Vicie Mutters, MD;  Location: [place];  Service: ENT;  Laterality: Right;  ? UPPER ESOPHAGEAL ENDOSCOPIC ULTRASOUND (EUS) N/A 06/12/2019  ? Procedure: UPPER ESOPHAGEAL ENDOSCOPIC ULTRASOUND (EUS);  Surgeon: Carol Ada, MD;  Location: [facility];  Service: Endoscopy;  Laterality: N/A;  ? WISDOM TOOTH EXTRACTION    ? ?Patient Active Problem List  ? Diagnosis Date Noted  ? UTI (urinary tract infection) 02/19/2022  ? 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 DVT (deep venous thrombosis) ([place]) 02/09/2022  ? Deep venous thrombosis of lower extremity ([place]) 12/14/2021  ? Patellar instability of right knee   ? Complex tear of lateral meniscus of right knee   ? Loose body in knee, right knee   ? Constipation 06/05/2021  ? 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 Family history of other diseases of the digestive system 06/05/2021  ? Nausea 06/05/2021  ? Otalgia, left 06/05/2021  ? Right upper quadrant pain 06/05/2021  ? Acute lateral meniscus tear of right knee 06/05/2021  ? Patellar dislocation, right, sequela 06/05/2021  ? 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 History of right mastoidectomy 04/10/2020  ? Postop check 02/29/2020  ? Bowel obstruction ([place]) 09/28/2019  ? Idiopathic thrombocytopenia ([place]) 09/28/2019  ?  Eustachian tube dysfunction, bilateral 09/28/2019  ? Tinnitus aurium, bilateral 09/28/2019  ? 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 Unspecified cholesteatoma, left ear 09/28/2019  ? IBS (irritable bowel syndrome) 07/09/2019  ? Headache 07/09/2019  ? GERD (gastroesophageal reflux disease) 07/09/2019  ? 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 Menopausal syndrome 03/13/2018  ? History of postoperative nausea 01/19/2017  ? Atypical chest pain  11/11/2016  ? 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 Epidermoid cyst on back 10/28/2011  ? ? 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 ?THERAPY DIAG:  ?Other abnormalities of gait and mobility ? ?Unsteadiness on feet ? ?Muscle weakness (generalized) ? ?Difficulty in walking, not elsewhere classified ? ?Stiffness of right knee, not elsewhere classified ? ?PERTINENT HISTORY: GBS, knee arthroscopy with lateral menisectomy 10/05/2021, HLD, cochlear implant, chronic headaches. ? ?PRECAUTIONS: Fall ? ?SUBJECTIVE: No new complaints. Had a rough week as she had to put her cat down this week. Has been using bike at home, has not been to gym at work due to events of the week.  ? ?PAIN:  ?Are you having pain? No ? ? ?TODAY'S TREATMENT: ? 04/15/2022 ? STRENGTHENING  ? Elliptical level 1.0 forward/backward x 3 minutes each way with bil UE support. Min guard assist for safety. ? Stool scoots on track: 115 feet each forward/backwards with only LE's moving stool ?With blue band around bil LE's just below knees: in squat position side stepping left<>right, then in wide BOS in squat position forward/backward stepping for 3 laps each/each way, cues on form and technique with light support on bars.  ? Forward lunges with back knee tapping airex on 6 inch box for 10 reps each side, bil UE support on bars, min guard assist for safety.  ?  ? ? BALANCE/NMR: ?Star foot taps: single leg stance on 1 inch foam with contralateral foot taps to 5 color discs in semi circle pattern for 2 full laps each LE. Single UE support on bar with min guard assist. Cues to breath with activity. Mild dizziness after second lap with right stance, resolved with seated rest break ?  ? ?  ?PATIENT EDUCATION: ?Education details: continue with current home program ?Person  educated: Patient ?Education method: Explanation ?Education comprehension: verbalized understanding ?  ?  ?HOME EXERCISE PROGRAM: ?Access Code: [email or web address] ? ?  ?  ?GOALS: ?Goals reviewed with patient? Yes ?  ?SHORT TERM GOALS: Target date: 03/26/2022 ?  ?Pt will be independent with initial HEP in order to build upon functional gains made in therapy. ?Baseline: Compliant to HEP. ?Goal status: MET ?  ?2.  Pt will undergo further assessment of FGA with LTG written. ?Baseline: assessed 03/02/2022 ?Goal status: MET ?  ?3.  Pt will improve AROM of R knee to at least 125 deg in order to demo improved ROM for gait.  ?Baseline: 125 deg; 129 deg 03/26/2022  ?Goal status: MET ?  ?4.  Pt will perform TUG in 13.5 sec or less with no AD in order to demo decr fall risk.  ?Baseline: 16.28 sec; 10.09 sec with no AD ?Goal status: MET ?  ?5.  Pt will improve gait speed with SPC vs. No AD to at least 2.5 ft/sec in order to demo improved community mobility.  ?  ?Baseline:  2.11 ft/sec with SPC, 11.06 sec with cane: 2.96 ft/sec on 03/24/22 ?Goal status: MET  ?  ?  ?  ?LONG TERM GOALS: Target date: 04/23/2022 ?  ?Pt will be independent with final HEP in order to build upon functional gains made in therapy. ?Baseline: No HEP yet ?Goal status: INITIAL ?  ?2.  Pt will improve FGA score to 25/30 in order to demonstrate improved balance and decreased fall risk. ?Baseline: 18/30 03/02/2022 ?Goal status: INITIAL ?  ?3.  Pt will improve gait speed with SPC vs. No AD to at least 3.0 ft/sec in order to demo improved community mobility.  ?Baseline: 2.11 ft/sec ? ?On 03/24/22: ?Gait speed w/ no AD: 14.31 = 2.29 ft/sec = limited community ambulator  ?11.06 sec with cane: 2.96 ft/sec = community ambulator  ?Goal status: INITIAL ?  ?4.  Pt will improve condition 4 of mCTSIB to at least 25 seconds in order to demo improved vestibular input for balance  ?Baseline: 12.3 seconds ?Goal status: INITIAL ?  ?5.  Pt will ambulate at least 500' over unlevel outdoor  surfaces with supervision and LRAD vs. No AD in order to demo improved community mobility. ?Baseline: not yet assessed. ?Goal status: INITIAL  ?  ?  ?ASSESSMENT: ?  ?CLINICAL IMPRESSION: ?Skilled session continued

## 2022-04-16 ENCOUNTER — Encounter: Payer: Self-pay | Admitting: Physical Therapy

## 2022-04-16 ENCOUNTER — Ambulatory Visit: Payer: 59 | Admitting: Physical Therapy

## 2022-04-16 DIAGNOSIS — R2689 Other abnormalities of gait and mobility: Secondary | ICD-10-CM | POA: Diagnosis not present

## 2022-04-16 DIAGNOSIS — M25661 Stiffness of right knee, not elsewhere classified: Secondary | ICD-10-CM | POA: Diagnosis not present

## 2022-04-16 DIAGNOSIS — R2681 Unsteadiness on feet: Secondary | ICD-10-CM | POA: Diagnosis not present

## 2022-04-16 DIAGNOSIS — M6281 Muscle weakness (generalized): Secondary | ICD-10-CM | POA: Diagnosis not present

## 2022-04-16 DIAGNOSIS — R262 Difficulty in walking, not elsewhere classified: Secondary | ICD-10-CM | POA: Diagnosis not present

## 2022-04-16 NOTE — Therapy (Signed)
?OUTPATIENT PHYSICAL THERAPY TREATMENT NOTE ? ? ?Patient Name: Christine Brady ?MRN: [id number] ?DOB:[birth year], 61 y.o., female ?Today's Date: 04/16/2022 ? ?PCP: Ronita Hipps, MD ?REFERRING PROVIDER: Melvenia Beam, MD ? ? PT End of Session - 04/16/22 0805   ? ? Visit Number 14   ? Number of Visits 24   ? Date for PT Re-Evaluation 05/27/22   ? Authorization Type [employer]   ? PT Start Time 0801   ? PT Stop Time [phone]   ? PT Time Calculation (min) 42 min   ? Equipment Utilized During Treatment Gait belt   ? Activity Tolerance Patient tolerated treatment well   ? Behavior During Therapy [facility] for tasks assessed/performed   ? ?  ?  ? ?  ? ? ? ?Past Medical History:  ?Diagnosis Date  ? Abdominal pain   ? Arthritis   ? difficulty walking  ? Atypical chest pain 11/11/2016  ? Bowel obstruction ([place])   ? Cholesteatoma of right ear   ? Chronic headaches   ? 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 Cyst   ? back  ? Hearing aid worn   ? History of ITP   ? Hyperlipidemia 11/11/2016  ? Large ovary 07/09/2019  ? PONV (postoperative nausea and vomiting)   ? Reflux   ? ?Past Surgical History:  ?Procedure Laterality Date  ? CHOLECYSTECTOMY  2010  ? ESOPHAGOGASTRODUODENOSCOPY (EGD) WITH PROPOFOL N/A 06/12/2019  ? Procedure: ESOPHAGOGASTRODUODENOSCOPY (EGD) WITH PROPOFOL;  Surgeon: Carol Ada, MD;  [facility]: [facility];  Service: Endoscopy;  Laterality: N/A;  ? EXTERNAL EAR SURGERY  2009/2010  ? EXTERNAL EAR SURGERY  2010  ? right ear  ? IR FLUORO GUIDE CV LINE RIGHT  02/10/2022  ? IR US GUIDE VASC ACCESS RIGHT  02/10/2022  ? KNEE ARTHROSCOPY    ? right  ? KNEE ARTHROSCOPY WITH MEDIAL PATELLAR FEMORAL LIGAMENT RECONSTRUCTION Right 10/05/2021  ? Procedure: right knee arthroscoy, debridement, lateral menisectomy, medial patellar femoral ligament reconstruction;  Surgeon: Meredith Pel, MD;  Location: [place];  Service: Orthopedics;  Laterality: Right;  ? KNEE LIGAMENT RECONSTRUCTION     ? left  ? MASTOIDECTOMY Right 01/19/2017  ? Procedure: RIGHT MODIFIED RADICAL MASTOIDECTOMY;  Surgeon: Vicie Mutters, MD;  Location: [place];  Service: ENT;  Laterality: Right;  ? UPPER ESOPHAGEAL ENDOSCOPIC ULTRASOUND (EUS) N/A 06/12/2019  ? Procedure: UPPER ESOPHAGEAL ENDOSCOPIC ULTRASOUND (EUS);  Surgeon: Carol Ada, MD;  Location: [facility];  Service: Endoscopy;  Laterality: N/A;  ? WISDOM TOOTH EXTRACTION    ? ?Patient Active Problem List  ? Diagnosis Date Noted  ? UTI (urinary tract infection) 02/19/2022  ? 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 DVT (deep venous thrombosis) ([place]) 02/09/2022  ? Deep venous thrombosis of lower extremity ([place]) 12/14/2021  ? Patellar instability of right knee   ? Complex tear of lateral meniscus of right knee   ? Loose body in knee, right knee   ? Constipation 06/05/2021  ? 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 Family history of other diseases of the digestive system 06/05/2021  ? Nausea 06/05/2021  ? Otalgia, left 06/05/2021  ? Right upper quadrant pain 06/05/2021  ? Acute lateral meniscus tear of right knee 06/05/2021  ? Patellar dislocation, right, sequela 06/05/2021  ? 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 History of right mastoidectomy 04/10/2020  ? Postop check 02/29/2020  ? Bowel obstruction ([place]) 09/28/2019  ? Idiopathic thrombocytopenia ([place]) 09/28/2019  ?  Eustachian tube dysfunction, bilateral 09/28/2019  ? Tinnitus aurium, bilateral 09/28/2019  ? 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 Unspecified cholesteatoma, left ear 09/28/2019  ? IBS (irritable bowel syndrome) 07/09/2019  ? Headache 07/09/2019  ? GERD (gastroesophageal reflux disease) 07/09/2019  ? 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 Menopausal syndrome 03/13/2018  ? History of postoperative nausea 01/19/2017  ? Atypical chest pain  11/11/2016  ? 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 Epidermoid cyst on back 10/28/2011  ? ? 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 ?THERAPY DIAG:  ?Other abnormalities of gait and mobility ? ?Unsteadiness on feet ? ?Muscle weakness (generalized) ? ?PERTINENT HISTORY: GBS, knee arthroscopy with lateral menisectomy 10/05/2021, HLD, cochlear implant, chronic headaches. ? ?PRECAUTIONS: Fall ? ?SUBJECTIVE: Knee discomfort continues to be better, she is trying to walk without the cane at all when she is at home in the house.  No falls, but continues to be very afraid of walking long distances especially outside without the cane due to potential fall and being on blood thinners. ? ?PAIN:  ?Are you having pain? No ? ? ?TODAY'S TREATMENT: ? ?Treadmill training progressed to 2.53mph at 4% incline for 33mins using BUE support, pt able to hold conversation throughout. ?Gait training on sidewalk x500' + 250' on grass w/ Supervision.  Pt demos single LOB to right when on grass using stepping strategy to recover.  Min cues for improved BOS, step length to promote endurance, and decreased visual reliance. ?Feet together eyes closed 2x30sec>tandem stance eyes closed 2x30sec on grass ?Resisted walking 345' in gym on level surface no LOB ? ? ?  ?PATIENT EDUCATION: ?Education details: Walking without cane in familiar outdoor environments, beginning on paved path/road, with good lighting (daytime) and for short distances to begin building tolerance outside the home. ?Person educated: Patient ?Education method: Explanation ?Education comprehension: verbalized understanding ?  ?  ?HOME EXERCISE PROGRAM: ?Access Code: [email or web address] ? ?  ?  ?GOALS: ?Goals reviewed with patient? Yes ?  ?SHORT TERM GOALS: Target date: 03/26/2022 ?  ?Pt will be  independent with initial HEP in order to build upon functional gains made in therapy. ?Baseline: Compliant to HEP. ?Goal status: MET ?  ?2.  Pt will undergo further assessment of FGA with LTG written. ?Baseline: assessed 03/02/2022 ?Goal status: MET ?  ?3.  Pt will improve AROM of R knee to at least 125 deg in order to demo improved ROM for gait.  ?Baseline: 125 deg; 129 deg 03/26/2022  ?Goal status: MET ?  ?4.  Pt will perform TUG in 13.5 sec or less with no AD in order to demo decr fall risk.  ?Baseline: 16.28 sec; 10.09 sec with no AD ?Goal status: MET ?  ?5.  Pt will improve gait speed with SPC vs. No AD to at least 2.5 ft/sec in order to demo improved community mobility.  ?  ?Baseline: 2.11 ft/sec with SPC, 11.06 sec with cane: 2.96 ft/sec on 03/24/22 ?Goal status: MET  ?  ?  ?  ?LONG TERM GOALS: Target date: 04/23/2022 ?  ?Pt will be independent with final HEP in order to build upon functional gains made in therapy. ?Baseline: No HEP yet ?Goal status: INITIAL ?  ?2.  Pt will improve FGA score to 25/30  in order to demonstrate improved balance and decreased fall risk. ?Baseline: 18/30 03/02/2022 ?Goal status: INITIAL ?  ?3.  Pt will improve gait speed with SPC vs. No AD to at least 3.0 ft/sec in order to demo improved community mobility.  ?Baseline: 2.11 ft/sec ? ?On 03/24/22: ?Gait speed w/ no AD: 14.31 = 2.29 ft/sec = limited community ambulator  ?11.06 sec with cane: 2.96 ft/sec = community ambulator  ?Goal status: INITIAL ?  ?4.  Pt will improve condition 4 of mCTSIB to at least 25 seconds in order to demo improved vestibular input for balance  ?Baseline: 12.3 seconds ?Goal status: INITIAL ?  ?5.  Pt will ambulate at least 500' over unlevel outdoor surfaces with supervision and LRAD vs. No AD in order to demo improved community mobility. ?Baseline: not yet assessed. ?Goal status: INITIAL  ?  ?  ?ASSESSMENT: ?  ?CLINICAL IMPRESSION: ?Focused session on gait training for endurance and decreased visualization of feet  during stepping.  Pt continues to demo increased fear of falling, but is much improved in use of balance strategies and tolerance to activity in general.  Her knee pain and edema are much better managed th

## 2022-04-20 ENCOUNTER — Other Ambulatory Visit (HOSPITAL_COMMUNITY): Payer: Self-pay

## 2022-04-21 ENCOUNTER — Encounter: Payer: Self-pay | Admitting: Physical Therapy

## 2022-04-21 ENCOUNTER — Ambulatory Visit: Payer: 59 | Admitting: Physical Therapy

## 2022-04-21 DIAGNOSIS — R2681 Unsteadiness on feet: Secondary | ICD-10-CM | POA: Diagnosis not present

## 2022-04-21 DIAGNOSIS — M25661 Stiffness of right knee, not elsewhere classified: Secondary | ICD-10-CM | POA: Diagnosis not present

## 2022-04-21 DIAGNOSIS — M6281 Muscle weakness (generalized): Secondary | ICD-10-CM | POA: Diagnosis not present

## 2022-04-21 DIAGNOSIS — R2689 Other abnormalities of gait and mobility: Secondary | ICD-10-CM

## 2022-04-21 DIAGNOSIS — R262 Difficulty in walking, not elsewhere classified: Secondary | ICD-10-CM | POA: Diagnosis not present

## 2022-04-21 NOTE — Therapy (Addendum)
?OUTPATIENT PHYSICAL THERAPY TREATMENT NOTE ? ? ?Patient Name: Christine Brady ?MRN: [id number] ?DOB:27-Oct-1961, 61 y.o., female ?Today's Date: 04/21/2022 ? ?PCP: Ronita Hipps, MD ?REFERRING PROVIDER: Ronita Hipps, MD ? ? PT End of Session - 04/21/22 0804   ? ? Visit Number 15   ? Number of Visits 24   ? Date for PT Re-Evaluation 05/27/22   ? Authorization Type [employer]   ? PT Start Time [phone]   ? PT Stop Time [phone]   ? PT Time Calculation (min) 40 min   ? Equipment Utilized During Treatment Gait belt   ? Activity Tolerance Patient tolerated treatment well   ? Behavior During Therapy [facility] for tasks assessed/performed   ? ?  ?  ? ?  ? ? ? ? ?Past Medical History:  ?Diagnosis Date  ? Abdominal pain   ? Arthritis   ? difficulty walking  ? Atypical chest pain 11/11/2016  ? Bowel obstruction ([place])   ? Cholesteatoma of right ear   ? Chronic headaches   ? 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 Cyst   ? back  ? Hearing aid worn   ? History of ITP   ? Hyperlipidemia 11/11/2016  ? Large ovary 07/09/2019  ? PONV (postoperative nausea and vomiting)   ? Reflux   ? ?Past Surgical History:  ?Procedure Laterality Date  ? CHOLECYSTECTOMY  2010  ? ESOPHAGOGASTRODUODENOSCOPY (EGD) WITH PROPOFOL N/A 06/12/2019  ? Procedure: ESOPHAGOGASTRODUODENOSCOPY (EGD) WITH PROPOFOL;  Surgeon: Carol Ada, MD;  Location: [facility];  Service: Endoscopy;  Laterality: N/A;  ? EXTERNAL EAR SURGERY  2009/2010  ? EXTERNAL EAR SURGERY  2010  ? right ear  ? IR FLUORO GUIDE CV LINE RIGHT  02/10/2022  ? IR US GUIDE VASC ACCESS RIGHT  02/10/2022  ? KNEE ARTHROSCOPY    ? right  ? KNEE ARTHROSCOPY WITH MEDIAL PATELLAR FEMORAL LIGAMENT RECONSTRUCTION Right 10/05/2021  ? Procedure: right knee arthroscoy, debridement, lateral menisectomy, medial patellar femoral ligament reconstruction;  Surgeon: Meredith Pel, MD;  Location: [place];  Service: Orthopedics;  Laterality: Right;  ? KNEE LIGAMENT  RECONSTRUCTION    ? left  ? MASTOIDECTOMY Right 01/19/2017  ? Procedure: RIGHT MODIFIED RADICAL MASTOIDECTOMY;  Surgeon: Vicie Mutters, MD;  Location: [place];  Service: ENT;  Laterality: Right;  ? UPPER ESOPHAGEAL ENDOSCOPIC ULTRASOUND (EUS) N/A 06/12/2019  ? Procedure: UPPER ESOPHAGEAL ENDOSCOPIC ULTRASOUND (EUS);  Surgeon: Carol Ada, MD;  Location: [facility];  Service: Endoscopy;  Laterality: N/A;  ? WISDOM TOOTH EXTRACTION    ? ?Patient Active Problem List  ? Diagnosis Date Noted  ? UTI (urinary tract infection) 02/19/2022  ? 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 DVT (deep venous thrombosis) ([place]) 02/09/2022  ? Deep venous thrombosis of lower extremity ([place]) 12/14/2021  ? Patellar instability of right knee   ? Complex tear of lateral meniscus of right knee   ? Loose body in knee, right knee   ? Constipation 06/05/2021  ? 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 Family history of other diseases of the digestive system 06/05/2021  ? Nausea 06/05/2021  ? Otalgia, left 06/05/2021  ? Right upper quadrant pain 06/05/2021  ? Acute lateral meniscus tear of right knee 06/05/2021  ? Patellar dislocation, right, sequela 06/05/2021  ? 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 History of right mastoidectomy 04/10/2020  ? Postop check 02/29/2020  ? Bowel obstruction ([place]) 09/28/2019  ? Idiopathic thrombocytopenia ([place])  09/28/2019  ? Eustachian tube dysfunction, bilateral 09/28/2019  ? Tinnitus aurium, bilateral 09/28/2019  ? 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 Unspecified cholesteatoma, left ear 09/28/2019  ? IBS (irritable bowel syndrome) 07/09/2019  ? Headache 07/09/2019  ? GERD (gastroesophageal reflux disease) 07/09/2019  ? 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 Menopausal syndrome 03/13/2018  ? History of postoperative nausea 01/19/2017  ?  Atypical chest pain 11/11/2016  ? 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 Epidermoid cyst on back 10/28/2011  ? ? 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 ?THERAPY DIAG:  ?Other abnormalities of gait and mobility ? ?Unsteadiness on feet ? ?Muscle weakness (generalized) ? ?Stiffness of right knee, not elsewhere classified ? ?PERTINENT HISTORY: GBS, knee arthroscopy with lateral menisectomy 10/05/2021, HLD, cochlear implant, chronic headaches. ? ?PRECAUTIONS: Fall ? ?SUBJECTIVE: Has not been using the cane at  home, reports it is going better. Has had a sharp pain in the anterior part of knee for the past 2 days. Started taking tylenol this morning to help with the pain.  One of her goals going forwards is to be able to go up and down stairs.  ? ?PAIN:  ?Are you having pain? Yes: NPRS scale: 4/10 ?Pain location: R anterior knee ?Pain description: Hervey Ard ?Aggravating factors: Over doing it ?Relieving factors: Not sure. ? ? ?TODAY'S TREATMENT: ? ?Goal assessment: ?Gait speed: 13.34 seconds with no AD = 2.45 ft/sec ?Condition 4 of mCTSIB: 30 seconds  ? ? [name] PT Assessment - 04/21/22 0807   ? ?  ? Functional Gait  Assessment  ? Gait assessed  Yes   ? Gait Level Surface Walks 20 ft, slow speed, abnormal gait pattern, evidence for imbalance or deviates 10-15 in outside of the 12 in walkway width. Requires more than 7 sec to ambulate 20 ft.   7.13  ? Change in Gait Speed Able to change speed, demonstrates mild gait deviations, deviates 6-10 in outside of the 12 in walkway width, or no gait deviations, unable to achieve a major change in velocity, or uses a change in velocity, or uses an assistive device.   ? Gait with Horizontal Head Turns Performs head turns smoothly with slight change in gait velocity (eg, minor disruption to  smooth gait path), deviates 6-10 in outside 12 in walkway width, or uses an assistive device.   ? Gait with Vertical Head Turns Performs head turns with no change in gait. Deviates no more than 6 in outside 12 in walkway width.   ? Gait and Pivot Turn Pivot turns safely within 3 sec and stops quickly with no loss of balance.   ? Step Over Obstacle Is able to step over 2 stacked shoe boxes taped together (9 in total height) without changing gait speed. No evidence of imbalance.   ? Gait with Narrow Base of Support Is able to ambulate for 10 steps heel to toe with no staggering.   ? Gait with Eyes Closed Walks 20 ft, slow speed, abnormal gait pattern, evidence for imbalance, deviates 10-15 in outside 12 in walkway width. Requires more than 9 sec to ambulate 20 ft.   12 seconds  ? Ambulating Backwards Walks 20 ft, uses assistive device, slower speed, mild gait deviations, deviates 6-10 in outside 12 in  walkway width.   ? Steps Alternating feet, no rail.   ? Total Score 23   ? FGA comment: Medium fall risk   ? ?  ?  ? ?  ? ? ? ?STAIRS: ? Level of Assistance: SBA and CGA ? Stair Negotiation Technique: Alternating Pattern  with No Rails ? Number of Stairs: 4 x 3 reps = 12 stairs total  ? Height of Stairs: 6  ?Comments: Increased time descending due to R knee pain/weakness, but pt able to maintain balance and perform with no UE support.  ? ?Took 19.75 seconds to perform (ascending and descending) ? ?GAIT: ?Gait pattern: step through pattern, decreased stance time- Right, and antalgic ?Distance walked: Clinic distances ?Assistive device utilized: None ?Level of assistance: SBA ? ? ? ?NMR: ?On blue foam beam: alternating heel taps to floor to work on eccentric quad control/balance x10 reps each leg, no UE support. Working on incr SLS time - Alternating cross body SLS cone taps x10 reps each leg. Alternating tapping soccer ball and rolling it forwards/backwards 3 times, 8 reps each leg. Postural sway, but pt able to maintain  balance.  ? ?On rockerboard in A/P direction with air ex on top; alternating forward step ups without UE support x10 reps each leg. Eyes open: x10 reps head turns, x10 reps head nods - pt able to primarily use ankle str

## 2022-04-22 ENCOUNTER — Encounter: Payer: Self-pay | Admitting: Neurology

## 2022-04-22 ENCOUNTER — Telehealth (INDEPENDENT_AMBULATORY_CARE_PROVIDER_SITE_OTHER): Payer: 59 | Admitting: Neurology

## 2022-04-22 DIAGNOSIS — G610 Guillain-Barre syndrome: Secondary | ICD-10-CM

## 2022-04-22 NOTE — Progress Notes (Signed)
?[facility] ? ? ? ?Provider:  Dr Jaynee Eagles ?Requesting Provider: Ronita Hipps, MD ?Primary Care Provider:  Ronita Hipps, MD ? 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 ? ?Virtual Visit via Video Note ? ?I connected with Christine Brady on 04/22/22 at  3:30 PM EDT by a video enabled telemedicine application and verified that I am speaking with the correct person using two identifiers. ? ?Location: ?Patient: home ?Provider: office ?  ?I discussed the limitations of evaluation and management by telemedicine and the availability of in person appointments. The patient expressed understanding and agreed to proceed. ? ?Follow Up Instructions: ? ?  ?I discussed the assessment and treatment plan with the patient. The patient was provided an opportunity to ask questions and all were answered. The patient agreed with the plan and demonstrated an understanding of the instructions. ?  ?The patient was advised to call back or seek an in-person evaluation if the symptoms worsen or if the condition fails to improve as anticipated. ? ?I provided 25 minutes of non-face-to-face time during this encounter. ? ? ?Melvenia Beam, MD ? ? ?04/22/2022: NOT using the cane. She is so much better she is getting stronger. She looks great. Even climbing stairs. She can feel her feet again. She is having bladder spasms still, she always feels like she has to go to the bathroom. She has been checked for urinary tract infection. Started with the CIDP. She is having urinary incontinence. She had an episode of washing dishes and before she knew it she peed on herself she didn't even feel it coming, no urge at all. Ongoing since the cidp.. She has been checked for UTI, recently treated, I recommend having [place] doctor refer because then [facility] will have all of their notes plus access to all my notes  and Epic/Care Everywhere.  ? ?Patient complains of symptoms per HPI as well as the following symptoms: bladder problems . Pertinent negatives and positives per HPI. All others negative: ? 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  We discussed options, patient started having symptoms after shingles shot, unclear if at this point with diagnoses AIDP or CIDP.  If AIDP, 5 days of Plex feeling better, she may not need ongoing treatment.  If CIDP, we discussed options such as pulsed dosing with steroids or IVIG.  She was concerned with IVIG due to her clotting risk, she is on Eliquis, I advised her to discuss with her hematologist.  At this point we discussed whether or not to start treatment, we decided that we will arrange for outpatient therapy, and watch for decline, if patient continues to improve we will treat, if patient starts to decline we will have to discuss options again. ? ?Patient complains of symptoms per HPI as well as the following symptoms: sensory loss, weakness . Pertinent negatives and positives per HPI. All others negative ? ? ?HPI:  Christine Brady is a 61 y.o. female here as requested by Ronita Hipps, MD for neuropathy/numbness and tingling. PMHx ITP, chronic headaches, HLD, IBS, Gerd. 3 weeks after surgery 10/05/2021 she was in a brace on her right  leg and it straight, started in both feet, acute, sudden,total bottom of th feet, no back pain, she got up one mornng and her feet felt funny, she was still inte cast but both were symmetrical. Feels like she has socks in her shoes. Started progressing in February and since has Progressed up to her waistline when she wipes she feels numb. Peroneal are feels numb.  She has urinary retention. She developed a UTI and never had that before. Goes up to the lower back and the pain runs down the buttocks. She has weakness in the legs. Progressively worse last few days, falls, using a cane.  ? ?Reviewed notes, labs and imaging from outside physicians, which showed: ? ?CT lumbar spine shows: IMPRESSION: ?1. Abnormal lumbar nerve root thickening and nodularity , new since ?previous. Considerations include infectious or inflammatory ?polyradiculopathy vs tumor (CSF mets or lymphoma). The findings were ?reviewed with Dr. Jeralyn Ruths, who concurs. ?2. Mild disc bulges L2-3 and L3-4 without compressive pathology. ?3. Right foraminal and subarticular recess narrowing L4-5 secondary ?to disc bulge and facet DJD. ?4. Right posterolateral protrusion L5-S1 slightly displacing the ?right S1 nerve root. ? ?Review of Systems: ?Patient complains of symptoms per HPI as well as the following symptoms falls. Pertinent negatives and positives per HPI. All others negative. ? ? ?Social History  ? ?Socioeconomic History  ? Marital status: Single  ?  Spouse name: Not on file  ? Number of children: Not on file  ? Years of education: Not on file  ? Highest education level: Not on file  ?Occupational History  ? Not on file  ?Tobacco Use  ? Smoking status: Never  ? Smokeless tobacco: Never  ?Vaping Use  ? Vaping Use: Never used  ?Substance and Sexual Activity  ? Alcohol use: Yes  ?  Comment: occas  ? Drug use: No  ? Sexual activity: Not on file  ?Other Topics Concern  ? Not on file  ?Social History Narrative  ? Not on file  ? ?Social Determinants of Health  ? ?Financial Resource Strain: Not on file  ?Food Insecurity: Not on file  ?Transportation Needs: Not on file  ?Physical Activity: Not on file  ?Stress: Not on file  ?Social Connections: Not on file  ?Intimate Partner Violence: Not on file  ? ? ?Family History  ?Problem Relation Age of Onset  ? Diabetes Mother   ? Asthma Mother   ? Diabetes type II Mother   ?  Alcohol abuse Mother   ? Hypertension Mother   ? Diabetes Father   ? Hypertension Father   ? Kidney disease Father   ? Diabetes type II Father   ? Alcohol abuse Father   ? Crohn's disease Sister   ? COPD Sister   ? Thrombocytopenia Sister   ? Cerebral palsy Brother   ? Neuropathy Neg Hx   ? ? ?Past Medical History:  ?Diagnosis Date  ? Abdominal pain   ? Arthritis   ? difficulty walking  ? Atypical chest pain 11/11/2016  ? Bowel obstruction ([place])   ? Cholesteatoma of right ear   ? Chronic headaches   ? 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 Cyst   ? back  ? Hearing aid worn   ? History of ITP   ? Hyperlipidemia 11/11/2016  ? Large ovary 07/09/2019  ? PONV (postoperative nausea and vomiting)   ? Reflux   ? ? ?Patient Active Problem List  ? Diagnosis Date Noted  ? UTI (urinary tract  infection) 02/19/2022  ? 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 DVT (deep venous thrombosis) ([place]) 02/09/2022  ? Deep venous thrombosis of lower extremity ([place]) 12/14/2021  ? Patellar instability of right knee   ? Complex tear of lateral meniscus of right knee   ? Loose body in knee, right knee   ? Constipation 06/05/2021  ? 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 Family history of other diseases of the digestive system 06/05/2021  ? Nausea 06/05/2021  ? Otalgia, left 06/05/2021  ? Right upper quadrant pain 06/05/2021  ? Acute lateral meniscus tear of right knee 06/05/2021  ? Patellar dislocation, right, sequela 06/05/2021  ? 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 History of right mastoidectomy 04/10/2020  ? Postop check 02/29/2020  ? Bowel obstruction ([place]) 09/28/2019  ? Idiopathic thrombocytopenia ([place]) 09/28/2019  ? Eustachian tube dysfunction, bilateral 09/28/2019  ? Tinnitus aurium, bilateral 09/28/2019  ? 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 Unspecified cholesteatoma, left ear 09/28/2019  ? IBS (irritable bowel syndrome) 07/09/2019  ? Headache 07/09/2019  ? GERD  (gastroesophageal reflux disease) 07/09/2019  ? 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 [place]

## 2022-04-23 ENCOUNTER — Ambulatory Visit: Payer: 59 | Admitting: Physical Therapy

## 2022-04-23 ENCOUNTER — Encounter: Payer: Self-pay | Admitting: Physical Therapy

## 2022-04-23 DIAGNOSIS — M25661 Stiffness of right knee, not elsewhere classified: Secondary | ICD-10-CM

## 2022-04-23 DIAGNOSIS — R2681 Unsteadiness on feet: Secondary | ICD-10-CM

## 2022-04-23 DIAGNOSIS — M6281 Muscle weakness (generalized): Secondary | ICD-10-CM | POA: Diagnosis not present

## 2022-04-23 DIAGNOSIS — R262 Difficulty in walking, not elsewhere classified: Secondary | ICD-10-CM | POA: Diagnosis not present

## 2022-04-23 DIAGNOSIS — R2689 Other abnormalities of gait and mobility: Secondary | ICD-10-CM | POA: Diagnosis not present

## 2022-04-23 NOTE — Therapy (Addendum)
?OUTPATIENT PHYSICAL THERAPY TREATMENT NOTE ? ? ?Patient Name: Christine Brady ?MRN: [id number] ?DOB:[birth year], 61 y.o., female ?Today's Date: 04/23/2022 ? ?PCP: Ronita Hipps, MD ?REFERRING PROVIDER: Melvenia Beam, MD ? ? PT End of Session - 04/23/22 0804   ? ? Visit Number 16   ? Number of Visits 24   ? Date for PT Re-Evaluation 05/27/22   ? Authorization Type [employer]   ? PT Start Time 0802   ? PT Stop Time 0845   ? PT Time Calculation (min) 43 min   ? Equipment Utilized During Treatment Gait belt   ? Activity Tolerance Patient tolerated treatment well   ? Behavior During Therapy [facility] for tasks assessed/performed   ? ?  ?  ? ?  ? ? ? ? ?Past Medical History:  ?Diagnosis Date  ? Abdominal pain   ? Arthritis   ? difficulty walking  ? Atypical chest pain 11/11/2016  ? Bowel obstruction ([place])   ? Cholesteatoma of right ear   ? Chronic headaches   ? 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 Cyst   ? back  ? Hearing aid worn   ? History of ITP   ? Hyperlipidemia 11/11/2016  ? Large ovary 07/09/2019  ? PONV (postoperative nausea and vomiting)   ? Reflux   ? ?Past Surgical History:  ?Procedure Laterality Date  ? CHOLECYSTECTOMY  2010  ? ESOPHAGOGASTRODUODENOSCOPY (EGD) WITH PROPOFOL N/A 06/12/2019  ? Procedure: ESOPHAGOGASTRODUODENOSCOPY (EGD) WITH PROPOFOL;  Surgeon: Carol Ada, MD;  Location: [facility];  Service: Endoscopy;  Laterality: N/A;  ? EXTERNAL EAR SURGERY  2009/2010  ? EXTERNAL EAR SURGERY  2010  ? right ear  ? IR FLUORO GUIDE CV LINE RIGHT  02/10/2022  ? IR US GUIDE VASC ACCESS RIGHT  02/10/2022  ? KNEE ARTHROSCOPY    ? right  ? KNEE ARTHROSCOPY WITH MEDIAL PATELLAR FEMORAL LIGAMENT RECONSTRUCTION Right 10/05/2021  ? Procedure: right knee arthroscoy, debridement, lateral menisectomy, medial patellar femoral ligament reconstruction;  Surgeon: Meredith Pel, MD;  Location: [place];  Service: Orthopedics;  Laterality: Right;  ? KNEE LIGAMENT  RECONSTRUCTION    ? left  ? MASTOIDECTOMY Right 01/19/2017  ? Procedure: RIGHT MODIFIED RADICAL MASTOIDECTOMY;  Surgeon: Vicie Mutters, MD;  Location: [place];  Service: ENT;  Laterality: Right;  ? UPPER ESOPHAGEAL ENDOSCOPIC ULTRASOUND (EUS) N/A 06/12/2019  ? Procedure: UPPER ESOPHAGEAL ENDOSCOPIC ULTRASOUND (EUS);  Surgeon: Carol Ada, MD;  Location: [facility];  Service: Endoscopy;  Laterality: N/A;  ? WISDOM TOOTH EXTRACTION    ? ?Patient Active Problem List  ? Diagnosis Date Noted  ? UTI (urinary tract infection) 02/19/2022  ? 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 DVT (deep venous thrombosis) ([place]) 02/09/2022  ? Deep venous thrombosis of lower extremity ([place]) 12/14/2021  ? Patellar instability of right knee   ? Complex tear of lateral meniscus of right knee   ? Loose body in knee, right knee   ? Constipation 06/05/2021  ? 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 Family history of other diseases of the digestive system 06/05/2021  ? Nausea 06/05/2021  ? Otalgia, left 06/05/2021  ? Right upper quadrant pain 06/05/2021  ? Acute lateral meniscus tear of right knee 06/05/2021  ? Patellar dislocation, right, sequela 06/05/2021  ? 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 History of right mastoidectomy 04/10/2020  ? Postop check 02/29/2020  ? Bowel obstruction ([place]) 09/28/2019  ? Idiopathic thrombocytopenia (  [place]) 09/28/2019  ? Eustachian tube dysfunction, bilateral 09/28/2019  ? Tinnitus aurium, bilateral 09/28/2019  ? 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 Unspecified cholesteatoma, left ear 09/28/2019  ? IBS (irritable bowel syndrome) 07/09/2019  ? Headache 07/09/2019  ? GERD (gastroesophageal reflux disease) 07/09/2019  ? 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 Menopausal syndrome 03/13/2018  ? History of postoperative nausea 01/19/2017   ? Atypical chest pain 11/11/2016  ? 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 Epidermoid cyst on back 10/28/2011  ? ? 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 ?THERAPY DIAG:  ?Other abnormalities of gait and mobility ? ?Unsteadiness on feet ? ?Muscle weakness (generalized) ? ?Stiffness of right knee, not elsewhere classified ? ?Difficulty in walking, not elsewhere classified ? ?PERTINENT HISTORY: GBS, knee arthroscopy with lateral menisectomy 10/05/2021, HLD, cochlear implant, chronic headaches. ? ?PRECAUTIONS: Fall ? ?SUBJECTIVE: No new complaints. No falls. Not using cane now since Monday with no issues.  ? ?PAIN:  ?Are you having pain? No ? ? ?TODAY'S TREATMENT: ? 04/23/2022 ? STRENGTHENING  ? Scifit LE's only level 4.5 x 8 minutes with goal >/= 75 for strengthening and for knee ROM. ? Reviewed HEP. Advanced ex's as indicated and added new ones for balance/strengthening. No issues noted or reported with performance in session. Min guard assist for safety with standing balance ex's.  ? ? ?GAIT: ?Gait pattern: step through pattern, decreased stance time- Right, decreased stride length, decreased hip/knee flexion- Right, and antalgic ?Distance walked: 500 x 1 in/outdoors, plus around clinic with session ?Assistive device utilized:  none ?Level of assistance:  supervision ?Comments: mildly antalgic pattern noted.  ? ?BALANCE/NMR: ?Side Stepping: on blue foam beam in squat position left<>right for 4 laps toward each side, no UE support with occasional touch to bars. Min guard assist for safety.  ?Tandem Walking: on blue foam beam with heel taps to floor with each step forward/backward for 2 laps each way with no UE support on bars, occasional touch with min guard to min assist for balance.     ? ?  ? ? ?  ?PATIENT  EDUCATION: ?Education details: progress toward remaining goals;  ?Person educated: Patient ?Education method: Explanation ?Education comprehension: verbalized understanding ?  ?  ?HOME EXERCISE PROGRAM: ?Access Code: [email or web address] ?URL: [email or web address] ?Date: 04/23/2022 ?Prepared by: Willow Ora ? ?Exercises ?- Seated Hamstring Curls with Resistance  - 1 x daily - 5 x weekly - 2 sets - 10 reps - 3 seconds hold ?- Staggered Sit-to-Stand  - 1 x daily - 5 x weekly - 2 sets - 12 reps ?- Narrow Stance with Eyes Closed on Foam Pad  - 1 x daily - 5 x weekly - 1 sets - 2 reps - 30 seconds hold ?- Tandem Stance with Eyes Closed  - 1 x daily - 5 x weekly - 1 sets - 2 reps - 30 seconds hold ?- Mini Squat with Chair  - 1 x daily - 5 x weekly - 2 sets - 10 reps - 3 hold ?- Prone Quadriceps Stretch with Strap  - 1 x daily - 5 x weekly - 3 sets - 30 hold ? ?Added these today 04/23/22: ?- Backward Tandem Walking with Counter Support  - 1 x daily - 5 x weekly -  1 sets - 4-6 reps ?- Walking Forward Lunge  - 1 x daily - 5 x weekly - 1 sets - 3-6 reps ?  ?  ?GOALS: ?Goals reviewed with patient? Yes ?  ?SHORT TERM GOALS: Target date: 03/26/2022 ?  ?Pt will be independent with initial HEP in order to build upon functional gains made in therapy. ?Baseline: Compliant to HEP. ?Goal status: MET ?  ?2.  Pt will undergo further assessment of FGA with LTG written. ?Baseline: assessed 03/02/2022 ?Goal status: MET ?  ?3.  Pt will improve AROM of R knee to at least 125 deg in order to demo improved ROM for gait.  ?Baseline: 125 deg; 129 deg 03/26/2022  ?Goal status: MET ?  ?4.  Pt will perform TUG in 13.5 sec or less with no AD in order to demo decr fall risk.  ?Baseline: 16.28 sec; 10.09 sec with no AD ?Goal status: MET ?  ?5.  Pt will improve gait speed with SPC vs. No AD to at least 2.5 ft/sec in order to demo improved community mobility.  ?  ?Baseline: 2.11 ft/sec with SPC, 11.06 sec with cane: 2.96 ft/sec on 03/24/22 ?Goal status:  MET  ?  ?  ?  ?LONG TERM GOALS: Target date: 04/23/2022 ?  ?Pt will be independent with final HEP in order to build upon functional gains made in therapy. ?Baseline: 04/23/22: reviewed and advanced HEP today, will benefit from

## 2022-04-28 ENCOUNTER — Encounter: Payer: Self-pay | Admitting: Physical Therapy

## 2022-04-28 ENCOUNTER — Ambulatory Visit: Payer: 59 | Admitting: Physical Therapy

## 2022-04-28 VITALS — BP 111/75 | HR 89

## 2022-04-28 DIAGNOSIS — R2681 Unsteadiness on feet: Secondary | ICD-10-CM | POA: Diagnosis not present

## 2022-04-28 DIAGNOSIS — R2689 Other abnormalities of gait and mobility: Secondary | ICD-10-CM | POA: Diagnosis not present

## 2022-04-28 DIAGNOSIS — R262 Difficulty in walking, not elsewhere classified: Secondary | ICD-10-CM | POA: Diagnosis not present

## 2022-04-28 DIAGNOSIS — M25661 Stiffness of right knee, not elsewhere classified: Secondary | ICD-10-CM | POA: Diagnosis not present

## 2022-04-28 DIAGNOSIS — M6281 Muscle weakness (generalized): Secondary | ICD-10-CM

## 2022-04-28 NOTE — Therapy (Signed)
?OUTPATIENT PHYSICAL THERAPY TREATMENT NOTE ? ? ?Patient Name: Christine Brady ?MRN: [id number] ?DOB:1961/08/29, 61 y.o., female ?Today's Date: 04/28/2022 ? ?PCP: Ronita Hipps, MD ?REFERRING PROVIDER: Ronita Hipps, MD ? ? PT End of Session - 04/28/22 0805   ? ? Visit Number 17   ? Number of Visits 24   ? Date for PT Re-Evaluation 05/27/22   ? Authorization Type [employer]   ? PT Start Time [id number]   ? PT Stop Time [phone]   ? PT Time Calculation (min) 40 min   ? Equipment Utilized During Treatment Gait belt   ? Activity Tolerance Patient tolerated treatment well   ? Behavior During Therapy [facility] for tasks assessed/performed   ? ?  ?  ? ?  ? ? ? ? ?Past Medical History:  ?Diagnosis Date  ? Abdominal pain   ? Arthritis   ? difficulty walking  ? Atypical chest pain 11/11/2016  ? Bowel obstruction ([place])   ? Cholesteatoma of right ear   ? Chronic headaches   ? 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 Cyst   ? back  ? Hearing aid worn   ? History of ITP   ? Hyperlipidemia 11/11/2016  ? Large ovary 07/09/2019  ? PONV (postoperative nausea and vomiting)   ? Reflux   ? ?Past Surgical History:  ?Procedure Laterality Date  ? CHOLECYSTECTOMY  2010  ? ESOPHAGOGASTRODUODENOSCOPY (EGD) WITH PROPOFOL N/A 06/12/2019  ? Procedure: ESOPHAGOGASTRODUODENOSCOPY (EGD) WITH PROPOFOL;  Surgeon: Carol Ada, MD;  [facility];  Service: Endoscopy;  Laterality: N/A;  ? EXTERNAL EAR SURGERY  2009/2010  ? EXTERNAL EAR SURGERY  2010  ? right ear  ? IR FLUORO GUIDE CV LINE RIGHT  02/10/2022  ? IR US GUIDE VASC ACCESS RIGHT  02/10/2022  ? KNEE ARTHROSCOPY    ? right  ? KNEE ARTHROSCOPY WITH MEDIAL PATELLAR FEMORAL LIGAMENT RECONSTRUCTION Right 10/05/2021  ? Procedure: right knee arthroscoy, debridement, lateral menisectomy, medial patellar femoral ligament reconstruction;  Surgeon: Meredith Pel, MD;  Location: [place];  Service: Orthopedics;  Laterality: Right;  ? KNEE LIGAMENT  RECONSTRUCTION    ? left  ? MASTOIDECTOMY Right 01/19/2017  ? Procedure: RIGHT MODIFIED RADICAL MASTOIDECTOMY;  Surgeon: Vicie Mutters, MD;  Location: [place];  Service: ENT;  Laterality: Right;  ? UPPER ESOPHAGEAL ENDOSCOPIC ULTRASOUND (EUS) N/A 06/12/2019  ? Procedure: UPPER ESOPHAGEAL ENDOSCOPIC ULTRASOUND (EUS);  Surgeon: Carol Ada, MD;  Location: [facility];  Service: Endoscopy;  Laterality: N/A;  ? WISDOM TOOTH EXTRACTION    ? ?Patient Active Problem List  ? Diagnosis Date Noted  ? UTI (urinary tract infection) 02/19/2022  ? 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 DVT (deep venous thrombosis) ([place]) 02/09/2022  ? Deep venous thrombosis of lower extremity ([place]) 12/14/2021  ? Patellar instability of right knee   ? Complex tear of lateral meniscus of right knee   ? Loose body in knee, right knee   ? Constipation 06/05/2021  ? 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 Family history of other diseases of the digestive system 06/05/2021  ? Nausea 06/05/2021  ? Otalgia, left 06/05/2021  ? Right upper quadrant pain 06/05/2021  ? Acute lateral meniscus tear of right knee 06/05/2021  ? Patellar dislocation, right, sequela 06/05/2021  ? 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 History of right mastoidectomy 04/10/2020  ? Postop check 02/29/2020  ? Bowel obstruction ([place]) 09/28/2019  ? Idiopathic thrombocytopenia (  [place]) 09/28/2019  ? Eustachian tube dysfunction, bilateral 09/28/2019  ? Tinnitus aurium, bilateral 09/28/2019  ? 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 Unspecified cholesteatoma, left ear 09/28/2019  ? IBS (irritable bowel syndrome) 07/09/2019  ? Headache 07/09/2019  ? GERD (gastroesophageal reflux disease) 07/09/2019  ? 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 Menopausal syndrome 03/13/2018  ? History of postoperative nausea 01/19/2017   ? Atypical chest pain 11/11/2016  ? 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 Epidermoid cyst on back 10/28/2011  ? ? 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 ?THERAPY DIAG:  ?Other abnormalities of gait and mobility ? ?Muscle weakness (generalized) ? ?Unsteadiness on feet ? ?Stiffness of right knee, not elsewhere classified ? ?PERTINENT HISTORY: GBS, knee arthroscopy with lateral menisectomy 10/05/2021, HLD, cochlear implant, chronic headaches. ? ?PRECAUTIONS: Fall ? ?SUBJECTIVE: Doing well, new exercises are going well.  ? ?PAIN:  ?Are you having pain? No ? ?Vitals:  ? 04/28/22 0835  ?BP: 111/75  ?Pulse: 89  ? ? ? ?TODAY'S TREATMENT: ?  ? ?STRENGTHENING:  ?Elliptical at gear 1.3 for strengthening, ROM, aerobic warmup/activity tolerance 3 minutes forwards and 2 minutes backwards for a total of 5 minutes.  ? ? ?With blue side of BOSU for balance/strengthening,  ?forward lunges x5 reps each side, with cues for proper technique, pt with UE support > none and min guard for balance. Incr difficulty with RLE on BOSU. Pt reporting lightheadedness due to not breathing between each rep of 5 and needing a standing rest break and then felt better to perform again. Attempted an additional 3 reps with LLE forwards on BOSU,but pt needing to rest due to lightheadedness and sit down. BP assessed at 111/75 and pt felt better after brief seated rest break.  ? ? ?GAIT: ?Gait pattern: step through pattern, decreased stance time- Right, decreased stride length, decreased hip/knee flexion- Right, and antalgic ?Distance walked: 230' x 1 with resistance, plus additional distances with no AD during session.  ?Assistive device utilized:  none ?Level of assistance:  supervision ?Comments: mildly antalgic pattern noted.  ? ? ?BALANCE/NMR: ?With pt  attached to resistance at equalizer machine, side stepping x3 reps each direction against resistance and then stepping back controlled to midline against resistance, pt more challenged stepping laterally leading with RLE, needing intermittent min guard for balance when stepping back to midline. Initial cues for incr step length.  ? ?Resisted forward walking for 230' x 1 with PT tech providing resistance and PT providing min guard with PT tech performing random perturbations, pt able to maintain balance by using stepping strategy.   ? ? 6 stepping stones next to // bars, performed stepping to each stone for SLS, then progressed difficulty by adding in a march before stepping to next stone for incr SLS time, down and back x4 reps, min guard as needed for balance.   ? ?On blue compliant surface; tandem gait with head turns x4 reps, with head nods x3 reps. Incr difficulty with head turns. UE support as needed for balance.  ? ?  ?PATIENT EDUCATION: ?Education details: Continue with HEP  ?Person educated: Patient ?Education method: Explanation ?Education comprehension: verbalized understanding ?  ?  ?HOME EXERCISE PROGRAM: ?Access Code: [email or web address] ? ?  ?  ?GOALS: ?Goals reviewed with  patient? Yes ?  ?SHORT TERM GOALS: Target date: 03/26/2022 ?  ?Pt will be independent with initial HEP in order to build upon functional gains made in therapy. ?Baseline: Compliant to HEP. ?Goal status: MET ?  ?2.  Pt will undergo further assessment of FGA with LTG written. ?Baseline: assessed 03/02/2022 ?Goal status: MET ?  ?3.  Pt will improve AROM of R knee to at least 125 deg in order to demo improved ROM for gait.  ?Baseline: 125 deg; 129 deg 03/26/2022  ?Goal status: MET ?  ?4.  Pt will perform TUG in 13.5 sec or less with no AD in order to demo decr fall risk.  ?Baseline: 16.28 sec; 10.09 sec with no AD ?Goal status: MET ?  ?5.  Pt will improve gait speed with SPC vs. No AD to at least 2.5 ft/sec in order to demo improved community  mobility.  ?  ?Baseline: 2.11 ft/sec with SPC, 11.06 sec with cane: 2.96 ft/sec on 03/24/22 ?Goal status: MET  ?  ?  ? ?LONG TERM GOALS: Target date: 05/27/22 ?  ?1. Pt will be independent with final HEP in order to build up

## 2022-04-30 ENCOUNTER — Encounter: Payer: Self-pay | Admitting: Physical Therapy

## 2022-04-30 ENCOUNTER — Ambulatory Visit: Payer: 59 | Admitting: Physical Therapy

## 2022-04-30 DIAGNOSIS — R2689 Other abnormalities of gait and mobility: Secondary | ICD-10-CM

## 2022-04-30 DIAGNOSIS — M6281 Muscle weakness (generalized): Secondary | ICD-10-CM

## 2022-04-30 DIAGNOSIS — M25661 Stiffness of right knee, not elsewhere classified: Secondary | ICD-10-CM | POA: Diagnosis not present

## 2022-04-30 DIAGNOSIS — R2681 Unsteadiness on feet: Secondary | ICD-10-CM | POA: Diagnosis not present

## 2022-04-30 DIAGNOSIS — R262 Difficulty in walking, not elsewhere classified: Secondary | ICD-10-CM | POA: Diagnosis not present

## 2022-04-30 NOTE — Patient Instructions (Signed)
Access Code: [email or web address] URL: [email or web address] Date: 04/30/2022 Prepared by: Elease Etienne  Exercises - Staggered Sit-to-Stand  - 1 x daily - 5 x weekly - 2 sets - 12 reps - Narrow Stance with Eyes Closed on Foam Pad  - 1 x daily - 5 x weekly - 1 sets - 2 reps - 30 seconds hold - Tandem Stance with Eyes Closed  - 1 x daily - 5 x weekly - 1 sets - 2 reps - 30 seconds hold - Mini Squat with Chair  - 1 x daily - 5 x weekly - 2 sets - 10 reps - 3 hold - Prone Quadriceps Stretch with Strap  - 1 x daily - 5 x weekly - 3 sets - 30 hold - Backward Tandem Walking with Counter Support  - 1 x daily - 5 x weekly - 1 sets - 4-6 reps - Walking Forward Lunge  - 1 x daily - 5 x weekly - 1 sets - 3-6 reps - Standing Hamstring Curl with Resistance  - 1 x daily - 5 x weekly - 2 sets - 10 reps

## 2022-04-30 NOTE — Therapy (Signed)
OUTPATIENT PHYSICAL THERAPY TREATMENT NOTE   Patient Name: [name] MRN: [id number] DOB:19-Oct-1961, 61 y.o., female Today's Date: 04/30/2022  PCP: Ronita Hipps, MD REFERRING PROVIDER: Melvenia Beam, MD   PT End of Session - 04/30/22 0816     Visit Number 18    Number of Visits 24    Date for PT Re-Evaluation 05/27/22    Authorization Type [employer]    PT Start Time 0801    PT Stop Time 0845    PT Time Calculation (min) 44 min    Equipment Utilized During Treatment Gait belt    Activity Tolerance Patient tolerated treatment well    Behavior During Therapy [facility] for tasks assessed/performed               Past Medical History:  Diagnosis Date   Abdominal pain    Arthritis    difficulty walking   Atypical chest pain 11/11/2016   Bowel obstruction ([place])    Cholesteatoma of right ear    Chronic headaches    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[place]) 11/11/2016   Cyst    back   Hearing aid worn    History of ITP    Hyperlipidemia 11/11/2016   Large ovary 07/09/2019   PONV (postoperative nausea and vomiting)    Reflux    Past Surgical History:  Procedure Laterality Date   CHOLECYSTECTOMY  2010   ESOPHAGOGASTRODUODENOSCOPY (EGD) WITH PROPOFOL N/A 06/12/2019   Procedure: ESOPHAGOGASTRODUODENOSCOPY (EGD) WITH PROPOFOL;  Surgeon: Carol Ada, MD;  Location: [facility];  Service: Endoscopy;  Laterality: N/A;   EXTERNAL EAR SURGERY  2009/2010   EXTERNAL EAR SURGERY  2010   right ear   IR FLUORO GUIDE CV LINE RIGHT  02/10/2022   IR US GUIDE VASC ACCESS RIGHT  02/10/2022   KNEE ARTHROSCOPY     right   KNEE ARTHROSCOPY WITH MEDIAL PATELLAR FEMORAL LIGAMENT RECONSTRUCTION Right 10/05/2021   Procedure: right knee arthroscoy, debridement, lateral menisectomy, medial patellar femoral ligament reconstruction;  Surgeon: Meredith Pel, MD;  Location: [place];  Service: Orthopedics;  Laterality: Right;   KNEE LIGAMENT  RECONSTRUCTION     left   MASTOIDECTOMY Right 01/19/2017   Procedure: RIGHT MODIFIED RADICAL MASTOIDECTOMY;  Surgeon: Vicie Mutters, MD;  Location: [place];  Service: ENT;  Laterality: Right;   UPPER ESOPHAGEAL ENDOSCOPIC ULTRASOUND (EUS) N/A 06/12/2019   Procedure: UPPER ESOPHAGEAL ENDOSCOPIC ULTRASOUND (EUS);  Surgeon: Carol Ada, MD;  Location[facility];  Service: Endoscopy;  Laterality: N/A;   WISDOM TOOTH EXTRACTION     Patient Active Problem List   Diagnosis Date Noted   UTI (urinary tract infection) 02/19/2022   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[place]) 02/09/2022   DVT (deep venous thrombosis) ([place]) 02/09/2022   Deep venous thrombosis of lower extremity ([place]) 12/14/2021   Patellar instability of right knee    Complex tear of lateral meniscus of right knee    Loose body in knee, right knee    Constipation 06/05/2021   Diverticular disease of colon 06/05/2021   Family history of other diseases of the digestive system 06/05/2021   Nausea 06/05/2021   Otalgia, left 06/05/2021   Right upper quadrant pain 06/05/2021   Acute lateral meniscus tear of right knee 06/05/2021   Patellar dislocation, right, sequela 06/05/2021   Diplacusis of left ear 10/02/2020   History of right mastoidectomy 04/10/2020   Postop check 02/29/2020   Bowel obstruction ([place]) 09/28/2019   Idiopathic thrombocytopenia (  [place]) 09/28/2019   Eustachian tube dysfunction, bilateral 09/28/2019   Tinnitus aurium, bilateral 09/28/2019   Tympanic membrane central perforation, 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   Unspecified cholesteatoma, left ear 09/28/2019   IBS (irritable bowel syndrome) 07/09/2019   Headache 07/09/2019   GERD (gastroesophageal reflux disease) 07/09/2019   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 07/03/2019 07/03/2019   Cecal volvulus ([place]) 07/03/2019   Intestinal volvulus ([place]) 07/03/2019   Menopausal syndrome 03/13/2018   History of postoperative nausea 01/19/2017    Atypical chest pain 11/11/2016   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[place]) 11/11/2016   Epidermoid cyst on back 10/28/2011    REFERRING DIAG: G61.81 (ICD-10-CM) - CIDP (chronic inflammatory demyelinating polyneuropathy) (HCC) G61.0 (ICD-10-CM) - Guillain Barr syndrome ([place]) R27.0 (ICD-10-CM) - Ataxia R26.9 (ICD-10-CM) - Gait abnormality   THERAPY DIAG:  Other abnormalities of gait and mobility  Muscle weakness (generalized)  Unsteadiness on feet  Stiffness of right knee, not elsewhere classified  PERTINENT HISTORY: GBS, knee arthroscopy with lateral menisectomy 10/05/2021, HLD, cochlear implant, chronic headaches.  PRECAUTIONS: Fall  SUBJECTIVE: HEP is going well, she's had no problems at work walking without cane consistently.  PAIN:  Are you having pain? No  TODAY'S TREATMENT: STRENGTHENING:  Treadmill training on incline cycle from 1-4% over 8 mins at 2.30mh for BLE strengthening and cardiovascular endurance.  PT provides brief patellar mobilization superiorly and inferiorly, brief assessment of medial and lateral patellar mobility WNL, light scar mobilization to inferior scar region due to hypomobility of inferior scar, demonstration of deep pressure to joint for comfort, and brief massage to lateral hamstring insertion for comfort as pt expresses soreness.  Edu for home use.  Modified mountain climbers against elevated mat 2x20; cued for correct postural alignment Standing hamstring curls 2x10 RLE only w/ red theraband-added to HEP, removed seated hamstring curls  BALANCE: Lunges w/ unilateral countertop support onto green airdisc w/ RLE only 4x6 into progressively deepened ROM, this progression helped improve pain experienced with lunges last session per pt. 2kg rebounder 4x10 progressing from toe touch to SLS on alt LE      PATIENT EDUCATION: Education details: Addition to HEP with safe setup at work as pt has standing desk.  Instruction on self-performance  of manual techniques used on patellar and knee joint for use at home for improved comfort and mobility.  Person educated: Patient Education method: Explanation Education comprehension: verbalized understanding     HOME EXERCISE PROGRAM: Access Code: [email or web address]      GOALS: Goals reviewed with patient? Yes   SHORT TERM GOALS: Target date: 03/26/2022   Pt will be independent with initial HEP in order to build upon functional gains made in therapy. Baseline: Compliant to HEP. Goal status: MET   2.  Pt will undergo further assessment of FGA with LTG written. Baseline: assessed 03/02/2022 Goal status: MET   3.  Pt will improve AROM of R knee to at least 125 deg in order to demo improved ROM for gait.  Baseline: 125 deg; 129 deg 03/26/2022  Goal status: MET   4.  Pt will perform TUG in 13.5 sec or less with no AD in order to demo decr fall risk.  Baseline: 16.28 sec; 10.09 sec with no AD Goal status: MET   5.  Pt will improve gait speed with SPC vs. No AD to at least 2.5 ft/sec in order to demo improved community mobility.    Baseline: 2.11 ft/sec with SPC, 11.06 sec with cane: 2.96 ft/sec on 03/24/22 Goal status: MET  LONG TERM GOALS: Target date: 05/27/22   1. Pt will be independent with final HEP in order to build upon functional gains made in therapy. Baseline: 04/23/22: reviewed and advanced HEP today, will benefit from continued advancement as pt progresses Goal status: ONGOING   2.  Pt will improve FGA score to 25/30 in order to demonstrate improved balance and decreased fall risk. Baseline: 18/30 03/02/2022; 23/30 on 04/21/22 Goal status: ONGOING   3.  Pt will improve gait speed with SPC vs. No AD to at least 2.8 ft/sec in order to demo improved community mobility.  Baseline: 04/21/22 13.34 seconds with no AD = 2.45 ft/sec = limited community ambulator  Goal status: REVISED   4.  Pt will perform 12 steps with supervision with alternating pattern and no railing in order  to demo improved stair technique.  Baseline:  Goal status: NEW   5.  Pt will ambulate at least 1,000' over unlevel outdoor surfaces with no AD with mod I over paved/grass surfaces in order to demo improved community mobility.  Baseline: 04/23/22: performed today with no device, supervision Goal status: REVISED      ASSESSMENT:   CLINICAL IMPRESSION: Continued treadmill training this session as pt states elliptical was extremely fatiguing session prior, advanced to higher incline than pt has previously attempted during incline cycle.  She is better tolerating SLS able to performs 2kg rebounder during session on RLE and LLE independently.  She is also demonstrating improved approach to step ups and obstacles during general mobility in gym this session.  She continues to benefit from skilled PT to address fall risk, right knee discomfort, and dynamic balance per POC.     OBJECTIVE IMPAIRMENTS Abnormal gait, decreased activity tolerance, decreased balance, decreased endurance, decreased mobility, decreased ROM, decreased strength, hypomobility, impaired flexibility, and impaired sensation.    ACTIVITY LIMITATIONS community activity.    PERSONAL FACTORS Age, Behavior pattern, Past/current experiences, and Time since onset of injury/illness/exacerbation are also affecting patient's functional outcome.      REHAB POTENTIAL: Good   CLINICAL DECISION MAKING: Evolving/moderate complexity   EVALUATION COMPLEXITY: Moderate   PLAN: PT FREQUENCY: 2x/week   PT DURATION: 12 weeks   PLANNED INTERVENTIONS: Therapeutic exercises, Therapeutic activity, Neuromuscular re-education, Balance training, Gait training, Patient/Family education, Joint mobilization, Stair training, Vestibular training, Orthotic/Fit training, DME instructions, and Manual therapy   PLAN FOR NEXT SESSION:    Continue to work on gait and balance training w/o AD outside on grass.  Continue w/ elliptical vs treadmill incline  training. RLE strengthening (esp quads and hamstring), step ups and step downs, stool/chair scoots progressing to use of resistance, resisted gait/mini lunges-band around thighs vs waist, work on SLS tasks on RLE (hurdles multi-directional, 3-way hip on airex). balance with vision removed  Elease Etienne, PT, DPT 04/30/22 10:01 AM

## 2022-05-05 ENCOUNTER — Ambulatory Visit: Payer: 59 | Admitting: Physical Therapy

## 2022-05-05 ENCOUNTER — Encounter: Payer: Self-pay | Admitting: Physical Therapy

## 2022-05-05 DIAGNOSIS — R2681 Unsteadiness on feet: Secondary | ICD-10-CM | POA: Diagnosis not present

## 2022-05-05 DIAGNOSIS — M6281 Muscle weakness (generalized): Secondary | ICD-10-CM | POA: Diagnosis not present

## 2022-05-05 DIAGNOSIS — M25661 Stiffness of right knee, not elsewhere classified: Secondary | ICD-10-CM | POA: Diagnosis not present

## 2022-05-05 DIAGNOSIS — R2689 Other abnormalities of gait and mobility: Secondary | ICD-10-CM | POA: Diagnosis not present

## 2022-05-05 DIAGNOSIS — R262 Difficulty in walking, not elsewhere classified: Secondary | ICD-10-CM | POA: Diagnosis not present

## 2022-05-05 NOTE — Therapy (Signed)
OUTPATIENT PHYSICAL THERAPY TREATMENT NOTE   Patient Name: DALEENA ROTTER MRN: [id number] DOB:Jan 23, 1961, 61 y.o., female Today's Date: 05/05/2022  PCP: Ronita Hipps, MD REFERRING PROVIDER: Ronita Hipps, MD   PT End of Session - 05/05/22 0805     Visit Number 19    Number of Visits 24    Date for PT Re-Evaluation 05/27/22    Authorization Type [employer]    PT Start Time [phone]    PT Stop Time [phone]    PT Time Calculation (min) 40 min    Equipment Utilized During Treatment Gait belt    Activity Tolerance Patient tolerated treatment well    Behavior During Therapy [facility] for tasks assessed/performed               Past Medical History:  Diagnosis Date   Abdominal pain    Arthritis    difficulty walking   Atypical chest pain 11/11/2016   Bowel obstruction ([place])    Cholesteatoma of right ear    Chronic headaches    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[place]) 11/11/2016   Cyst    back   Hearing aid worn    History of ITP    Hyperlipidemia 11/11/2016   Large ovary 07/09/2019   PONV (postoperative nausea and vomiting)    Reflux    Past Surgical History:  Procedure Laterality Date   CHOLECYSTECTOMY  2010   ESOPHAGOGASTRODUODENOSCOPY (EGD) WITH PROPOFOL N/A 06/12/2019   Procedure: ESOPHAGOGASTRODUODENOSCOPY (EGD) WITH PROPOFOL;  Surgeon: Carol Ada, MD;  [facility];  Service: Endoscopy;  Laterality: N/A;   EXTERNAL EAR SURGERY  2009/2010   EXTERNAL EAR SURGERY  2010   right ear   IR FLUORO GUIDE CV LINE RIGHT  02/10/2022   IR US GUIDE VASC ACCESS RIGHT  02/10/2022   KNEE ARTHROSCOPY     right   KNEE ARTHROSCOPY WITH MEDIAL PATELLAR FEMORAL LIGAMENT RECONSTRUCTION Right 10/05/2021   Procedure: right knee arthroscoy, debridement, lateral menisectomy, medial patellar femoral ligament reconstruction;  Surgeon: Meredith Pel, MD;  Location: [place];  Service: Orthopedics;  Laterality: Right;   KNEE LIGAMENT  RECONSTRUCTION     left   MASTOIDECTOMY Right 01/19/2017   Procedure: RIGHT MODIFIED RADICAL MASTOIDECTOMY;  Surgeon: Vicie Mutters, MD;  Location: [place];  Service: ENT;  Laterality: Right;   UPPER ESOPHAGEAL ENDOSCOPIC ULTRASOUND (EUS) N/A 06/12/2019   Procedure: UPPER ESOPHAGEAL ENDOSCOPIC ULTRASOUND (EUS);  Surgeon: Carol Ada, MD;  [facility];  Service: Endoscopy;  Laterality: N/A;   WISDOM TOOTH EXTRACTION     Patient Active Problem List   Diagnosis Date Noted   UTI (urinary tract infection) 02/19/2022   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[place]) 02/09/2022   DVT (deep venous thrombosis) ([place]) 02/09/2022   Deep venous thrombosis of lower extremity ([place]) 12/14/2021   Patellar instability of right knee    Complex tear of lateral meniscus of right knee    Loose body in knee, right knee    Constipation 06/05/2021   Diverticular disease of colon 06/05/2021   Family history of other diseases of the digestive system 06/05/2021   Nausea 06/05/2021   Otalgia, left 06/05/2021   Right upper quadrant pain 06/05/2021   Acute lateral meniscus tear of right knee 06/05/2021   Patellar dislocation, right, sequela 06/05/2021   Diplacusis of left ear 10/02/2020   History of right mastoidectomy 04/10/2020   Postop check 02/29/2020   Bowel obstruction ([place]) 09/28/2019   Idiopathic thrombocytopenia (  [place]) 09/28/2019   Eustachian tube dysfunction, bilateral 09/28/2019   Tinnitus aurium, bilateral 09/28/2019   Tympanic membrane central perforation, 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   Unspecified cholesteatoma, left ear 09/28/2019   IBS (irritable bowel syndrome) 07/09/2019   Headache 07/09/2019   GERD (gastroesophageal reflux disease) 07/09/2019   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 07/03/2019 07/03/2019   Cecal volvulus ([place]) 07/03/2019   Intestinal volvulus ([place]) 07/03/2019   Menopausal syndrome 03/13/2018   History of postoperative nausea 01/19/2017    Atypical chest pain 11/11/2016   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[place]) 11/11/2016   Epidermoid cyst on back 10/28/2011    REFERRING DIAG: G61.81 (ICD-10-CM) - CIDP (chronic inflammatory demyelinating polyneuropathy) (HCC) G61.0 (ICD-10-CM) - Guillain Barr syndrome ([place]) R27.0 (ICD-10-CM) - Ataxia R26.9 (ICD-10-CM) - Gait abnormality   THERAPY DIAG:  Other abnormalities of gait and mobility  Unsteadiness on feet  Muscle weakness (generalized)  PERTINENT HISTORY: GBS, knee arthroscopy with lateral menisectomy 10/05/2021, HLD, cochlear implant, chronic headaches.  PRECAUTIONS: Fall  SUBJECTIVE: Reports increased soreness and tightness to R knee today. Tried the hamstring exercises and went up to 30 reps at a time and that was challenging.  PAIN:  Are you having pain? No  TODAY'S TREATMENT: STRENGTHENING:   Scifit LE's only level 4.7 x 8 minutes with goal >/= 85 for strengthening and for knee ROM.   Mini lunge and marching contralateral leg x5 reps each side, verbal/demo cues for proper technique and for proper knee alignment. Pt needing to use BUE support for balance with RLE forwards. Pt reporting beginning to feel lightheaded during with RLE forwards, but pt able to finish 5 reps with no issues and pt needing to give her cues to remind her to breathe.    BALANCE: On rockerboard in A/P direction: -with air ex on top; alternating forward/cross body SLS taps to cones x12 reps each side, incr hesitancy with RLE as stance leg, pt able to perform without UE support for balance -alternating heel taps to floor x10 reps each leg for eccentric quad control/balance. Pt reports no pain, just more difficulty with RLE   With green disc on floor: -10 reps lateral lunges to L side, 2 sets of 5 reps to R side. Cues for technique. Needing intermittent standing rest breaks for pt to breathe. Pt almost losing balance a couple times, but pt able to regain on her own.   On black side of  BOSU: -weight shifting x12 reps each side with 3 second holds  -10 reps head turns, 10 reps head nods with feet hip width distance, incr difficulty with turns.        PATIENT EDUCATION: Education details: Decr reps of hamstring curls to 10-15 reps at a time vs. Doing 30 at a time as that could have irritated pt's knee.  Person educated: Patient Education method: Explanation Education comprehension: verbalized understanding     HOME EXERCISE PROGRAM: Access Code: [email or web address]      GOALS: Goals reviewed with patient? Yes   SHORT TERM GOALS: Target date: 03/26/2022   Pt will be independent with initial HEP in order to build upon functional gains made in therapy. Baseline: Compliant to HEP. Goal status: MET   2.  Pt will undergo further assessment of FGA with LTG written. Baseline: assessed 03/02/2022 Goal status: MET   3.  Pt will improve AROM of R knee to at least 125 deg in order to demo improved ROM for gait.  Baseline: 125 deg; 129 deg 03/26/2022  Goal status:  MET   4.  Pt will perform TUG in 13.5 sec or less with no AD in order to demo decr fall risk.  Baseline: 16.28 sec; 10.09 sec with no AD Goal status: MET   5.  Pt will improve gait speed with SPC vs. No AD to at least 2.5 ft/sec in order to demo improved community mobility.    Baseline: 2.11 ft/sec with SPC, 11.06 sec with cane: 2.96 ft/sec on 03/24/22 Goal status: MET       LONG TERM GOALS: Target date: 05/27/22   1. Pt will be independent with final HEP in order to build upon functional gains made in therapy. Baseline: 04/23/22: reviewed and advanced HEP today, will benefit from continued advancement as pt progresses Goal status: ONGOING   2.  Pt will improve FGA score to 25/30 in order to demonstrate improved balance and decreased fall risk. Baseline: 18/30 03/02/2022; 23/30 on 04/21/22 Goal status: ONGOING   3.  Pt will improve gait speed with SPC vs. No AD to at least 2.8 ft/sec in order to demo improved  community mobility.  Baseline: 04/21/22 13.34 seconds with no AD = 2.45 ft/sec = limited community ambulator  Goal status: REVISED   4.  Pt will perform 12 steps with supervision with alternating pattern and no railing in order to demo improved stair technique.  Baseline:  Goal status: NEW   5.  Pt will ambulate at least 1,000' over unlevel outdoor surfaces with no AD with mod I over paved/grass surfaces in order to demo improved community mobility.  Baseline: 04/23/22: performed today with no device, supervision Goal status: REVISED      ASSESSMENT:   CLINICAL IMPRESSION: Today's skilled session continued to focus on BLE strengthening, balance on compliant surfaces, and RLE SLS tasks. With more challenging strengthening for RLE such as forward/lateral lunges pt reports that she sometimes gets lightheaded due to not breathing during exercises. Pt needs continued cues to breathe throughout and needs intermittent standing rest breaks afterwards. Pt needing to use UE support for balance for RLE lunge with contralateral march. Will continue to progress towards LTGs.      OBJECTIVE IMPAIRMENTS Abnormal gait, decreased activity tolerance, decreased balance, decreased endurance, decreased mobility, decreased ROM, decreased strength, hypomobility, impaired flexibility, and impaired sensation.    ACTIVITY LIMITATIONS community activity.    PERSONAL FACTORS Age, Behavior pattern, Past/current experiences, and Time since onset of injury/illness/exacerbation are also affecting patient's functional outcome.      REHAB POTENTIAL: Good   CLINICAL DECISION MAKING: Evolving/moderate complexity   EVALUATION COMPLEXITY: Moderate   PLAN: PT FREQUENCY: 2x/week   PT DURATION: 12 weeks   PLANNED INTERVENTIONS: Therapeutic exercises, Therapeutic activity, Neuromuscular re-education, Balance training, Gait training, Patient/Family education, Joint mobilization, Stair training, Vestibular training,  Orthotic/Fit training, DME instructions, and Manual therapy   PLAN FOR NEXT SESSION:    Continue to work on gait and balance training w/o AD outside on grass.  Continue w/ elliptical vs treadmill incline training. RLE strengthening (esp quads and hamstring), step ups and step downs, stool/chair scoots progressing to use of resistance, resisted gait/mini lunges-band around thighs vs waist, work on SLS tasks on RLE (hurdles multi-directional, 3-way hip on airex). balance with vision removed  Elease Etienne, PT, DPT 05/05/22 8:53 AM

## 2022-05-06 ENCOUNTER — Encounter: Payer: Self-pay | Admitting: Physical Therapy

## 2022-05-06 ENCOUNTER — Ambulatory Visit: Payer: 59 | Admitting: Physical Therapy

## 2022-05-06 DIAGNOSIS — M25661 Stiffness of right knee, not elsewhere classified: Secondary | ICD-10-CM | POA: Diagnosis not present

## 2022-05-06 DIAGNOSIS — M6281 Muscle weakness (generalized): Secondary | ICD-10-CM | POA: Diagnosis not present

## 2022-05-06 DIAGNOSIS — R262 Difficulty in walking, not elsewhere classified: Secondary | ICD-10-CM | POA: Diagnosis not present

## 2022-05-06 DIAGNOSIS — R2689 Other abnormalities of gait and mobility: Secondary | ICD-10-CM

## 2022-05-06 DIAGNOSIS — R2681 Unsteadiness on feet: Secondary | ICD-10-CM | POA: Diagnosis not present

## 2022-05-06 NOTE — Therapy (Signed)
OUTPATIENT PHYSICAL THERAPY TREATMENT NOTE   Patient Name: Christine Brady MRN: [id number] DOB:June 06, 1961, 61 y.o., female Today's Date: 05/06/2022  PCP: Ronita Hipps, MD REFERRING PROVIDER: Melvenia Beam, MD   PT End of Session - 05/06/22 0805     Visit Number 20    Number of Visits 24    Date for PT Re-Evaluation 05/27/22    Authorization Type [employer]    PT Start Time [phone]    PT Stop Time [phone]    PT Time Calculation (min) 41 min    Equipment Utilized During Treatment Gait belt    Activity Tolerance Patient tolerated treatment well    Behavior During Therapy [facility] for tasks assessed/performed               Past Medical History:  Diagnosis Date   Abdominal pain    Arthritis    difficulty walking   Atypical chest pain 11/11/2016   Bowel obstruction ([place])    Cholesteatoma of right ear    Chronic headaches    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[place]) 11/11/2016   Cyst    back   Hearing aid worn    History of ITP    Hyperlipidemia 11/11/2016   Large ovary 07/09/2019   PONV (postoperative nausea and vomiting)    Reflux    Past Surgical History:  Procedure Laterality Date   CHOLECYSTECTOMY  2010   ESOPHAGOGASTRODUODENOSCOPY (EGD) WITH PROPOFOL N/A 06/12/2019   Procedure: ESOPHAGOGASTRODUODENOSCOPY (EGD) WITH PROPOFOL;  Surgeon: Carol Ada, MD;  Location: [facility];  Service: Endoscopy;  Laterality: N/A;   EXTERNAL EAR SURGERY  2009/2010   EXTERNAL EAR SURGERY  2010   right ear   IR FLUORO GUIDE CV LINE RIGHT  02/10/2022   IR US GUIDE VASC ACCESS RIGHT  02/10/2022   KNEE ARTHROSCOPY     right   KNEE ARTHROSCOPY WITH MEDIAL PATELLAR FEMORAL LIGAMENT RECONSTRUCTION Right 10/05/2021   Procedure: right knee arthroscoy, debridement, lateral menisectomy, medial patellar femoral ligament reconstruction;  Surgeon: Meredith Pel, MD;  Location: [place];  Service: Orthopedics;  Laterality: Right;   KNEE LIGAMENT  RECONSTRUCTION     left   MASTOIDECTOMY Right 01/19/2017   Procedure: RIGHT MODIFIED RADICAL MASTOIDECTOMY;  Surgeon: Vicie Mutters, MD;  Location: [place];  Service: ENT;  Laterality: Right;   UPPER ESOPHAGEAL ENDOSCOPIC ULTRASOUND (EUS) N/A 06/12/2019   Procedure: UPPER ESOPHAGEAL ENDOSCOPIC ULTRASOUND (EUS);  Surgeon: Carol Ada, MD;  [facility];  Service: Endoscopy;  Laterality: N/A;   WISDOM TOOTH EXTRACTION     Patient Active Problem List   Diagnosis Date Noted   UTI (urinary tract infection) 02/19/2022   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[place]) 02/09/2022   DVT (deep venous thrombosis) ([place]) 02/09/2022   Deep venous thrombosis of lower extremity ([place]) 12/14/2021   Patellar instability of right knee    Complex tear of lateral meniscus of right knee    Loose body in knee, right knee    Constipation 06/05/2021   Diverticular disease of colon 06/05/2021   Family history of other diseases of the digestive system 06/05/2021   Nausea 06/05/2021   Otalgia, left 06/05/2021   Right upper quadrant pain 06/05/2021   Acute lateral meniscus tear of right knee 06/05/2021   Patellar dislocation, right, sequela 06/05/2021   Diplacusis of left ear 10/02/2020   History of right mastoidectomy 04/10/2020   Postop check 02/29/2020   Bowel obstruction ([place]) 09/28/2019   Idiopathic thrombocytopenia (  [place]) 09/28/2019   Eustachian tube dysfunction, bilateral 09/28/2019   Tinnitus aurium, bilateral 09/28/2019   Tympanic membrane central perforation, 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   Unspecified cholesteatoma, left ear 09/28/2019   IBS (irritable bowel syndrome) 07/09/2019   Headache 07/09/2019   GERD (gastroesophageal reflux disease) 07/09/2019   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 07/03/2019 07/03/2019   Cecal volvulus ([place]) 07/03/2019   Intestinal volvulus ([place]) 07/03/2019   Menopausal syndrome 03/13/2018   History of postoperative nausea 01/19/2017    Atypical chest pain 11/11/2016   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[place]) 11/11/2016   Epidermoid cyst on back 10/28/2011    REFERRING DIAG: G61.81 (ICD-10-CM) - CIDP (chronic inflammatory demyelinating polyneuropathy) (HCC) G61.0 (ICD-10-CM) - Guillain Barr syndrome ([place]) R27.0 (ICD-10-CM) - Ataxia R26.9 (ICD-10-CM) - Gait abnormality   THERAPY DIAG:  Other abnormalities of gait and mobility  Unsteadiness on feet  Muscle weakness (generalized)  Difficulty in walking, not elsewhere classified  PERTINENT HISTORY: GBS, knee arthroscopy with lateral menisectomy 10/05/2021, HLD, cochlear implant, chronic headaches.  PRECAUTIONS: Fall  SUBJECTIVE: Pt reports feeling better today, backed off the band ex's and did nothing extra yesterday after therapy.   PAIN:  Are you having pain? No    TODAY'S TREATMENT: STRENGTHENING:  NuStep  only level 5 x 8 minutes with goal >/= 85 for strengthening and for knee ROM.  Stool scoots: with pt pulling PTA for 115 feet forward, then with use of green band resistance with pt pulling PTA as well backwards for 115 feet.   BALANCE/NMR: On BOSU with light UE support on bars -alternating forward step ups with contralateral march for 10 reps each side - lateral stepping over BOSU: both feet on floor<>both feet on BOSU<>both feet on other side floor for 10 reps each direction with light UE support, cues for increased step length  On Inverted BOSU - rocking anterior/posterior direction for 10 reps each way with no UE support, min guard assist for safety - rocking laterally for 10 reps each way with no UE support, min guard assist for safety - mini squats with no UE support for 10 reps,  pt with reports of lightheadedness around rep 8, able to complete after short rest with cues on breath technique with exercise, min guard assist for safety.   Side Stepping: on blue foam beam in squat position for 4 reps toward each side, no UE support, min guard  assist for safety Tandem Walking: on blue foam beam with heel taps to floor with each step for 4 laps each forward/backwards, no UE support, occasional touch to bars, min guard assist for safety.         PATIENT EDUCATION: Education details: Decr reps of hamstring curls to 10-15 reps at a time vs. Doing 30 at a time as that could have irritated pt's knee.  Person educated: Patient Education method: Explanation Education comprehension: verbalized understanding     HOME EXERCISE PROGRAM: Access Code: [email or web address]      GOALS: Goals reviewed with patient? Yes   SHORT TERM GOALS: Target date: 03/26/2022   Pt will be independent with initial HEP in order to build upon functional gains made in therapy. Baseline: Compliant to HEP. Goal status: MET   2.  Pt will undergo further assessment of FGA with LTG written. Baseline: assessed 03/02/2022 Goal status: MET   3.  Pt will improve AROM of R knee to at least 125 deg in order to demo improved ROM for gait.  Baseline: 125 deg; 129 deg 03/26/2022  Goal status: MET   4.  Pt will perform TUG in 13.5 sec or less with no AD in order to demo decr fall risk.  Baseline: 16.28 sec; 10.09 sec with no AD Goal status: MET   5.  Pt will improve gait speed with SPC vs. No AD to at least 2.5 ft/sec in order to demo improved community mobility.    Baseline: 2.11 ft/sec with SPC, 11.06 sec with cane: 2.96 ft/sec on 03/24/22 Goal status: MET       LONG TERM GOALS: Target date: 05/27/22   1. Pt will be independent with final HEP in order to build upon functional gains made in therapy. Baseline: 04/23/22: reviewed and advanced HEP today, will benefit from continued advancement as pt progresses Goal status: ONGOING   2.  Pt will improve FGA score to 25/30 in order to demonstrate improved balance and decreased fall risk. Baseline: 18/30 03/02/2022; 23/30 on 04/21/22 Goal status: ONGOING   3.  Pt will improve gait speed with SPC vs. No AD to at least 2.8  ft/sec in order to demo improved community mobility.  Baseline: 04/21/22 13.34 seconds with no AD = 2.45 ft/sec = limited community ambulator  Goal status: REVISED   4.  Pt will perform 12 steps with supervision with alternating pattern and no railing in order to demo improved stair technique.  Baseline:  Goal status: NEW   5.  Pt will ambulate at least 1,000' over unlevel outdoor surfaces with no AD with mod I over paved/grass surfaces in order to demo improved community mobility.  Baseline: 04/23/22: performed today with no device, supervision Goal status: REVISED      ASSESSMENT:   CLINICAL IMPRESSION: Today's skilled session continued to focus on LE strengthening and balance training with no issues noted. The pt is making steady progress toward goals and should benefit from continued PT to progress toward unmet goals.     OBJECTIVE IMPAIRMENTS Abnormal gait, decreased activity tolerance, decreased balance, decreased endurance, decreased mobility, decreased ROM, decreased strength, hypomobility, impaired flexibility, and impaired sensation.    ACTIVITY LIMITATIONS community activity.    PERSONAL FACTORS Age, Behavior pattern, Past/current experiences, and Time since onset of injury/illness/exacerbation are also affecting patient's functional outcome.      REHAB POTENTIAL: Good   CLINICAL DECISION MAKING: Evolving/moderate complexity   EVALUATION COMPLEXITY: Moderate   PLAN: PT FREQUENCY: 2x/week   PT DURATION: 12 weeks   PLANNED INTERVENTIONS: Therapeutic exercises, Therapeutic activity, Neuromuscular re-education, Balance training, Gait training, Patient/Family education, Joint mobilization, Stair training, Vestibular training, Orthotic/Fit training, DME instructions, and Manual therapy   PLAN FOR NEXT SESSION:    Continue to work on gait and balance training w/o AD outside on grass.  Continue w/ elliptical vs treadmill incline training. RLE strengthening (esp quads and  hamstring), step ups and step downs, stool/chair scoots with use of resistance- have pt use weak leg only for this to increase difficulty, resisted gait/mini lunges-band around thighs vs waist, work on SLS tasks on RLE (hurdles multi-directional, 3-way hip on airex). balance with vision removed  Willow Ora, PTA, [place] [address] [place], [place] [address] [phone] 05/06/22, 11:37 AM

## 2022-05-07 DIAGNOSIS — H6983 Other specified disorders of Eustachian tube, bilateral: Secondary | ICD-10-CM | POA: Diagnosis not present

## 2022-05-07 DIAGNOSIS — Z9089 Acquired absence of other organs: Secondary | ICD-10-CM | POA: Diagnosis not present

## 2022-05-07 DIAGNOSIS — D693 Immune thrombocytopenic purpura: Secondary | ICD-10-CM | POA: Diagnosis not present

## 2022-05-07 DIAGNOSIS — H93222 Diplacusis, left ear: Secondary | ICD-10-CM | POA: Diagnosis not present

## 2022-05-07 DIAGNOSIS — Z9889 Other specified postprocedural states: Secondary | ICD-10-CM | POA: Diagnosis not present

## 2022-05-07 DIAGNOSIS — H90A11 Conductive hearing loss, unilateral, right ear with restricted hearing on the contralateral side: Secondary | ICD-10-CM | POA: Diagnosis not present

## 2022-05-07 DIAGNOSIS — H9313 Tinnitus, bilateral: Secondary | ICD-10-CM | POA: Diagnosis not present

## 2022-05-07 DIAGNOSIS — H7411 Adhesive right middle ear disease: Secondary | ICD-10-CM | POA: Diagnosis not present

## 2022-05-07 DIAGNOSIS — Z9621 Cochlear implant status: Secondary | ICD-10-CM | POA: Diagnosis not present

## 2022-05-12 ENCOUNTER — Ambulatory Visit: Payer: 59 | Admitting: Physical Therapy

## 2022-05-13 ENCOUNTER — Ambulatory Visit: Payer: 59 | Attending: Neurology | Admitting: Physical Therapy

## 2022-05-13 ENCOUNTER — Encounter: Payer: Self-pay | Admitting: Physical Therapy

## 2022-05-13 DIAGNOSIS — R262 Difficulty in walking, not elsewhere classified: Secondary | ICD-10-CM | POA: Diagnosis not present

## 2022-05-13 DIAGNOSIS — R2681 Unsteadiness on feet: Secondary | ICD-10-CM | POA: Insufficient documentation

## 2022-05-13 DIAGNOSIS — M25661 Stiffness of right knee, not elsewhere classified: Secondary | ICD-10-CM | POA: Insufficient documentation

## 2022-05-13 DIAGNOSIS — M6281 Muscle weakness (generalized): Secondary | ICD-10-CM | POA: Diagnosis not present

## 2022-05-13 DIAGNOSIS — R2689 Other abnormalities of gait and mobility: Secondary | ICD-10-CM | POA: Insufficient documentation

## 2022-05-13 NOTE — Therapy (Signed)
OUTPATIENT PHYSICAL THERAPY TREATMENT NOTE   Patient Name: Christine Brady MRN: [id number] DOB:1961/11/28, 61 y.o., female Today's Date: 05/13/2022  PCP: Ronita Hipps, MD REFERRING PROVIDER: Melvenia Beam, MD   PT End of Session - 05/13/22 0804     Visit Number 21    Number of Visits 24    Date for PT Re-Evaluation 05/27/22    Authorization Type [employer]    PT Start Time 0802    PT Stop Time [phone]    PT Time Calculation (min) 42 min    Equipment Utilized During Treatment Gait belt    Activity Tolerance Patient tolerated treatment well    Behavior During Therapy [facility] for tasks assessed/performed               Past Medical History:  Diagnosis Date   Abdominal pain    Arthritis    difficulty walking   Atypical chest pain 11/11/2016   Bowel obstruction ([place])    Cholesteatoma of right ear    Chronic headaches    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[place]) 11/11/2016   Cyst    back   Hearing aid worn    History of ITP    Hyperlipidemia 11/11/2016   Large ovary 07/09/2019   PONV (postoperative nausea and vomiting)    Reflux    Past Surgical History:  Procedure Laterality Date   CHOLECYSTECTOMY  2010   ESOPHAGOGASTRODUODENOSCOPY (EGD) WITH PROPOFOL N/A 06/12/2019   Procedure: ESOPHAGOGASTRODUODENOSCOPY (EGD) WITH PROPOFOL;  Surgeon: Carol Ada, MD;  [facility];  Service: Endoscopy;  Laterality: N/A;   EXTERNAL EAR SURGERY  2009/2010   EXTERNAL EAR SURGERY  2010   right ear   IR FLUORO GUIDE CV LINE RIGHT  02/10/2022   IR US GUIDE VASC ACCESS RIGHT  02/10/2022   KNEE ARTHROSCOPY     right   KNEE ARTHROSCOPY WITH MEDIAL PATELLAR FEMORAL LIGAMENT RECONSTRUCTION Right 10/05/2021   Procedure: right knee arthroscoy, debridement, lateral menisectomy, medial patellar femoral ligament reconstruction;  Surgeon: Meredith Pel, MD;  Location: [place];  Service: Orthopedics;  Laterality: Right;   KNEE LIGAMENT  RECONSTRUCTION     left   MASTOIDECTOMY Right 01/19/2017   Procedure: RIGHT MODIFIED RADICAL MASTOIDECTOMY;  Surgeon: Vicie Mutters, MD;  Location: [place];  Service: ENT;  Laterality: Right;   UPPER ESOPHAGEAL ENDOSCOPIC ULTRASOUND (EUS) N/A 06/12/2019   Procedure: UPPER ESOPHAGEAL ENDOSCOPIC ULTRASOUND (EUS);  Surgeon: Carol Ada, MD;  [facility];  Service: Endoscopy;  Laterality: N/A;   WISDOM TOOTH EXTRACTION     Patient Active Problem List   Diagnosis Date Noted   UTI (urinary tract infection) 02/19/2022   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[place]) 02/09/2022   DVT (deep venous thrombosis) ([place]) 02/09/2022   Deep venous thrombosis of lower extremity ([place]) 12/14/2021   Patellar instability of right knee    Complex tear of lateral meniscus of right knee    Loose body in knee, right knee    Constipation 06/05/2021   Diverticular disease of colon 06/05/2021   Family history of other diseases of the digestive system 06/05/2021   Nausea 06/05/2021   Otalgia, left 06/05/2021   Right upper quadrant pain 06/05/2021   Acute lateral meniscus tear of right knee 06/05/2021   Patellar dislocation, right, sequela 06/05/2021   Diplacusis of left ear 10/02/2020   History of right mastoidectomy 04/10/2020   Postop check 02/29/2020   Bowel obstruction ([place]) 09/28/2019   Idiopathic thrombocytopenia (  [place]) 09/28/2019   Eustachian tube dysfunction, bilateral 09/28/2019   Tinnitus aurium, bilateral 09/28/2019   Tympanic membrane central perforation, 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   Unspecified cholesteatoma, left ear 09/28/2019   IBS (irritable bowel syndrome) 07/09/2019   Headache 07/09/2019   GERD (gastroesophageal reflux disease) 07/09/2019   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 07/03/2019 07/03/2019   Cecal volvulus ([place]) 07/03/2019   Intestinal volvulus ([place]) 07/03/2019   Menopausal syndrome 03/13/2018   History of postoperative nausea 01/19/2017    Atypical chest pain 11/11/2016   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[place]) 11/11/2016   Epidermoid cyst on back 10/28/2011    REFERRING DIAG: G61.81 (ICD-10-CM) - CIDP (chronic inflammatory demyelinating polyneuropathy) (HCC) G61.0 (ICD-10-CM) - Guillain Barr syndrome ([place]) R27.0 (ICD-10-CM) - Ataxia R26.9 (ICD-10-CM) - Gait abnormality   THERAPY DIAG:  Other abnormalities of gait and mobility  Unsteadiness on feet  Muscle weakness (generalized)  Difficulty in walking, not elsewhere classified  Stiffness of right knee, not elsewhere classified  PERTINENT HISTORY: GBS, knee arthroscopy with lateral menisectomy 10/05/2021, HLD, cochlear implant, chronic headaches.  PRECAUTIONS: Fall  SUBJECTIVE: No new complaints. No falls. Some soreness/stiffness today. Continues to go to the gym.    PAIN:  Are you having pain? No    TODAY'S TREATMENT: 05/13/2022  STRENGTHENING:  Scifit level 5.0 with LE's only x 8 minutes with goal >/= 85 for strengthening and for knee ROM.  On red mat next to mat table:  - alternating forward mini lunges with upper trunk rotation left<>right for 3 reps each side, then 2 reps each side. Limited by dizziness that resolved with rest breaks and did not resume activity due to onset of dizziness with each attempt despite patient breathing  - alternating lateral mini lunges with chest press for 10 reps toward each side, supervision for safety.  - modified plank with forearms on mat table: alternating knee drops toward red mat/back up for 2 sets of 5 reps each side.  - wide staggered stance for static mini lunge lowering back knee down to blue side of BOSU<>back up for 10 reps each side, UE support on mat table with min guard assist for safety.   BALANCE/NMR: Rockerboard: in anterior/posterior direction- alternating forward heel taps to floor for 10 reps each side with light support on bars; then rocking the board with emphasis on posture/weight shifting  with EO no UE support, min guard assist for balance, then holding board steady    On BOSU with light UE support on bars of Treadmill (pt shown how to do this so to be safe with use at gym) - alternating forward step ups with contralateral march for 10 reps each side - lateral stepping over BOSU with contralateral hip side kick for 10 reps each side       PATIENT EDUCATION: Education details:continue with current HEP Person educated: Patient Education method: Explanation Education comprehension: verbalized understanding     HOME EXERCISE PROGRAM: Access Code: [email or web address]      GOALS: Goals reviewed with patient? Yes   SHORT TERM GOALS: Target date: 03/26/2022   Pt will be independent with initial HEP in order to build upon functional gains made in therapy. Baseline: Compliant to HEP. Goal status: MET   2.  Pt will undergo further assessment of FGA with LTG written. Baseline: assessed 03/02/2022 Goal status: MET   3.  Pt will improve AROM of R knee to at least 125 deg in order to demo improved ROM for gait.  Baseline: 125 deg; 129  deg 03/26/2022  Goal status: MET   4.  Pt will perform TUG in 13.5 sec or less with no AD in order to demo decr fall risk.  Baseline: 16.28 sec; 10.09 sec with no AD Goal status: MET   5.  Pt will improve gait speed with SPC vs. No AD to at least 2.5 ft/sec in order to demo improved community mobility.    Baseline: 2.11 ft/sec with SPC, 11.06 sec with cane: 2.96 ft/sec on 03/24/22 Goal status: MET       LONG TERM GOALS: Target date: 05/27/22   1. Pt will be independent with final HEP in order to build upon functional gains made in therapy. Baseline: 04/23/22: reviewed and advanced HEP today, will benefit from continued advancement as pt progresses Goal status: ONGOING   2.  Pt will improve FGA score to 25/30 in order to demonstrate improved balance and decreased fall risk. Baseline: 18/30 03/02/2022; 23/30 on 04/21/22 Goal status: ONGOING   3.   Pt will improve gait speed with SPC vs. No AD to at least 2.8 ft/sec in order to demo improved community mobility.  Baseline: 04/21/22 13.34 seconds with no AD = 2.45 ft/sec = limited community ambulator  Goal status: REVISED   4.  Pt will perform 12 steps with supervision with alternating pattern and no railing in order to demo improved stair technique.  Baseline:  Goal status: NEW   5.  Pt will ambulate at least 1,000' over unlevel outdoor surfaces with no AD with mod I over paved/grass surfaces in order to demo improved community mobility.  Baseline: 04/23/22: performed today with no device, supervision Goal status: REVISED      ASSESSMENT:   CLINICAL IMPRESSION: Today's skilled session continued to focus on LE strengthening and balance training with no issues noted. Increased difficulty with forward mini lunges, better when modified to use of BOSU. The pt is making steady progress toward goals and should benefit from continued PT to progress toward unmet goals.     OBJECTIVE IMPAIRMENTS Abnormal gait, decreased activity tolerance, decreased balance, decreased endurance, decreased mobility, decreased ROM, decreased strength, hypomobility, impaired flexibility, and impaired sensation.    ACTIVITY LIMITATIONS community activity.    PERSONAL FACTORS Age, Behavior pattern, Past/current experiences, and Time since onset of injury/illness/exacerbation are also affecting patient's functional outcome.      REHAB POTENTIAL: Good   CLINICAL DECISION MAKING: Evolving/moderate complexity   EVALUATION COMPLEXITY: Moderate   PLAN: PT FREQUENCY: 2x/week   PT DURATION: 12 weeks   PLANNED INTERVENTIONS: Therapeutic exercises, Therapeutic activity, Neuromuscular re-education, Balance training, Gait training, Patient/Family education, Joint mobilization, Stair training, Vestibular training, Orthotic/Fit training, DME instructions, and Manual therapy   PLAN FOR NEXT SESSION:    Continue to work  on gait and balance training w/o AD outside on grass.  Continue w/ elliptical vs treadmill incline training. RLE strengthening (esp quads and hamstring), step ups and step downs, stool/chair scoots with use of resistance- have pt use weak leg only for this to increase difficulty, resisted gait/mini lunges-band around thighs vs waist, work on SLS tasks on RLE (hurdles multi-directional, 3-way hip on airex). balance with   vision removed   Willow Ora, PTA, [place] [address] [place], [place] [address] [phone] 05/13/22, 9:22 AM

## 2022-05-14 ENCOUNTER — Encounter: Payer: Self-pay | Admitting: Physical Therapy

## 2022-05-14 ENCOUNTER — Ambulatory Visit: Payer: 59 | Admitting: Physical Therapy

## 2022-05-14 DIAGNOSIS — M6281 Muscle weakness (generalized): Secondary | ICD-10-CM | POA: Diagnosis not present

## 2022-05-14 DIAGNOSIS — R262 Difficulty in walking, not elsewhere classified: Secondary | ICD-10-CM | POA: Diagnosis not present

## 2022-05-14 DIAGNOSIS — R2689 Other abnormalities of gait and mobility: Secondary | ICD-10-CM | POA: Diagnosis not present

## 2022-05-14 DIAGNOSIS — R2681 Unsteadiness on feet: Secondary | ICD-10-CM | POA: Diagnosis not present

## 2022-05-14 DIAGNOSIS — M25661 Stiffness of right knee, not elsewhere classified: Secondary | ICD-10-CM | POA: Diagnosis not present

## 2022-05-14 NOTE — Therapy (Signed)
OUTPATIENT PHYSICAL THERAPY TREATMENT NOTE   Patient Name: Christine Brady MRN: [id number] DOB:1961-09-09, 61 y.o., female Today's Date: 05/14/2022  PCP: Ronita Hipps, MD REFERRING PROVIDER: Ronita Hipps, MD   PT End of Session - 05/14/22 0801     Visit Number 22    Number of Visits 24    Date for PT Re-Evaluation 05/27/22    Authorization Type [employer]    PT Start Time 0800    PT Stop Time [phone]    PT Time Calculation (min) 42 min    Equipment Utilized During Treatment Gait belt    Activity Tolerance Patient tolerated treatment well    Behavior During Therapy [facility] for tasks assessed/performed               Past Medical History:  Diagnosis Date   Abdominal pain    Arthritis    difficulty walking   Atypical chest pain 11/11/2016   Bowel obstruction ([place])    Cholesteatoma of right ear    Chronic headaches    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[place]) 11/11/2016   Cyst    back   Hearing aid worn    History of ITP    Hyperlipidemia 11/11/2016   Large ovary 07/09/2019   PONV (postoperative nausea and vomiting)    Reflux    Past Surgical History:  Procedure Laterality Date   CHOLECYSTECTOMY  2010   ESOPHAGOGASTRODUODENOSCOPY (EGD) WITH PROPOFOL N/A 06/12/2019   Procedure: ESOPHAGOGASTRODUODENOSCOPY (EGD) WITH PROPOFOL;  Surgeon: Carol Ada, MD;  Location: [facility];  Service: Endoscopy;  Laterality: N/A;   EXTERNAL EAR SURGERY  2009/2010   EXTERNAL EAR SURGERY  2010   right ear   IR FLUORO GUIDE CV LINE RIGHT  02/10/2022   IR US GUIDE VASC ACCESS RIGHT  02/10/2022   KNEE ARTHROSCOPY     right   KNEE ARTHROSCOPY WITH MEDIAL PATELLAR FEMORAL LIGAMENT RECONSTRUCTION Right 10/05/2021   Procedure: right knee arthroscoy, debridement, lateral menisectomy, medial patellar femoral ligament reconstruction;  Surgeon: Meredith Pel, MD;  Location: [place];  Service: Orthopedics;  Laterality: Right;   KNEE LIGAMENT RECONSTRUCTION      left   MASTOIDECTOMY Right 01/19/2017   Procedure: RIGHT MODIFIED RADICAL MASTOIDECTOMY;  Surgeon: Vicie Mutters, MD;  Location: [place];  Service: ENT;  Laterality: Right;   UPPER ESOPHAGEAL ENDOSCOPIC ULTRASOUND (EUS) N/A 06/12/2019   Procedure: UPPER ESOPHAGEAL ENDOSCOPIC ULTRASOUND (EUS);  Surgeon: Carol Ada, MD;  [facility];  Service: Endoscopy;  Laterality: N/A;   WISDOM TOOTH EXTRACTION     Patient Active Problem List   Diagnosis Date Noted   UTI (urinary tract infection) 02/19/2022   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[place]) 02/09/2022   DVT (deep venous thrombosis) ([place]) 02/09/2022   Deep venous thrombosis of lower extremity ([place]) 12/14/2021   Patellar instability of right knee    Complex tear of lateral meniscus of right knee    Loose body in knee, right knee    Constipation 06/05/2021   Diverticular disease of colon 06/05/2021   Family history of other diseases of the digestive system 06/05/2021   Nausea 06/05/2021   Otalgia, left 06/05/2021   Right upper quadrant pain 06/05/2021   Acute lateral meniscus tear of right knee 06/05/2021   Patellar dislocation, right, sequela 06/05/2021   Diplacusis of left ear 10/02/2020   History of right mastoidectomy 04/10/2020   Postop check 02/29/2020   Bowel obstruction ([place]) 09/28/2019   Idiopathic thrombocytopenia (  [place]) 09/28/2019   Eustachian tube dysfunction, bilateral 09/28/2019   Tinnitus aurium, bilateral 09/28/2019   Tympanic membrane central perforation, 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   Unspecified cholesteatoma, left ear 09/28/2019   IBS (irritable bowel syndrome) 07/09/2019   Headache 07/09/2019   GERD (gastroesophageal reflux disease) 07/09/2019   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 07/03/2019 07/03/2019   Cecal volvulus ([place]) 07/03/2019   Intestinal volvulus ([place]) 07/03/2019   Menopausal syndrome 03/13/2018   History of postoperative nausea 01/19/2017   Atypical  chest pain 11/11/2016   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[place]) 11/11/2016   Epidermoid cyst on back 10/28/2011    REFERRING DIAG: G61.81 (ICD-10-CM) - CIDP (chronic inflammatory demyelinating polyneuropathy) (HCC) G61.0 (ICD-10-CM) - Guillain Barr syndrome ([place]) R27.0 (ICD-10-CM) - Ataxia R26.9 (ICD-10-CM) - Gait abnormality   THERAPY DIAG:  Other abnormalities of gait and mobility  Unsteadiness on feet  Muscle weakness (generalized)  PERTINENT HISTORY: GBS, knee arthroscopy with lateral menisectomy 10/05/2021, HLD, cochlear implant, chronic headaches.  PRECAUTIONS: Fall  SUBJECTIVE: Thinks knee was aggravated the other day when doing lunges or possibly when trying the leg press. Reports it is feeling better this morning.   PAIN:  Are you having pain? No    TODAY'S TREATMENT:  STRENGTHENING:  Scifit level 5.0 with LE's only x 8 minutes with goal >/= 85 for strengthening and for knee ROM.     BALANCE/NMR: Rockerboard: in anterior/posterior direction- keeping board steady EC x 30 seconds ; w/ EC: x10 reps head turns, x10 reps head nods, in M/L direction keeping board steady 2 x 30 seconds EC, pt more challenged by M/L direction. Intermittent taps to bars as needed for balance, esp with head motions   On blue foam beam: -tandem gait forward/retro down and back x3 reps with eccentric heel tap to floor before tandem stance -mini squats down and back x4 reps, initial cues for technique, pt reporting mild knee pain, but able to continue to perform.   On blue balance disc: -keeping LLE on compliant surface and then tapping RLE to cone x10 reps then repeated x10 reps with RLE, pt able to perform with UE support > none.    On air ex: -tandem stance bilat x30 seconds EO, then x10 reps head turns bilat, no UE support needed  With rebounder: -performing with SLS on level ground and tossing ball to rebounder and catching ball, performed multiple reps each side, able to  perform 15 seconds bilat and with final rep pt able to perform with 30 second SLS holds on both legs.      PATIENT EDUCATION: Education details:continue with current HEP Person educated: Patient Education method: Explanation Education comprehension: verbalized understanding     HOME EXERCISE PROGRAM: Access Code: [email or web address]      GOALS: Goals reviewed with patient? Yes   SHORT TERM GOALS: Target date: 03/26/2022   Pt will be independent with initial HEP in order to build upon functional gains made in therapy. Baseline: Compliant to HEP. Goal status: MET   2.  Pt will undergo further assessment of FGA with LTG written. Baseline: assessed 03/02/2022 Goal status: MET   3.  Pt will improve AROM of R knee to at least 125 deg in order to demo improved ROM for gait.  Baseline: 125 deg; 129 deg 03/26/2022  Goal status: MET   4.  Pt will perform TUG in 13.5 sec or less with no AD in order to demo decr fall risk.  Baseline: 16.28 sec; 10.09 sec with no AD  Goal status: MET   5.  Pt will improve gait speed with SPC vs. No AD to at least 2.5 ft/sec in order to demo improved community mobility.    Baseline: 2.11 ft/sec with SPC, 11.06 sec with cane: 2.96 ft/sec on 03/24/22 Goal status: MET       LONG TERM GOALS: Target date: 05/27/22   1. Pt will be independent with final HEP in order to build upon functional gains made in therapy. Baseline: 04/23/22: reviewed and advanced HEP today, will benefit from continued advancement as pt progresses Goal status: ONGOING   2.  Pt will improve FGA score to 25/30 in order to demonstrate improved balance and decreased fall risk. Baseline: 18/30 03/02/2022; 23/30 on 04/21/22 Goal status: ONGOING   3.  Pt will improve gait speed with SPC vs. No AD to at least 2.8 ft/sec in order to demo improved community mobility.  Baseline: 04/21/22 13.34 seconds with no AD = 2.45 ft/sec = limited community ambulator  Goal status: REVISED   4.  Pt will perform 12  steps with supervision with alternating pattern and no railing in order to demo improved stair technique.  Baseline:  Goal status: NEW   5.  Pt will ambulate at least 1,000' over unlevel outdoor surfaces with no AD with mod I over paved/grass surfaces in order to demo improved community mobility.  Baseline: 04/23/22: performed today with no device, supervision Goal status: REVISED      ASSESSMENT:   CLINICAL IMPRESSION: Today's skilled session continued to focus BLE strengthening and standing balance on compliant surfaces and SLS tasks. Pt more challenged by [facility] on rockerboard in M/L direction, but did improve with incr reps. Pt able to perform SLS bilat for 30 seconds when performing dynamic tasks like tossing the ball and catching it at the rebounder. Will continue to progress towards LTGs.     OBJECTIVE IMPAIRMENTS Abnormal gait, decreased activity tolerance, decreased balance, decreased endurance, decreased mobility, decreased ROM, decreased strength, hypomobility, impaired flexibility, and impaired sensation.    ACTIVITY LIMITATIONS community activity.    PERSONAL FACTORS Age, Behavior pattern, Past/current experiences, and Time since onset of injury/illness/exacerbation are also affecting patient's functional outcome.      REHAB POTENTIAL: Good   CLINICAL DECISION MAKING: Evolving/moderate complexity   EVALUATION COMPLEXITY: Moderate   PLAN: PT FREQUENCY: 2x/week   PT DURATION: 12 weeks   PLANNED INTERVENTIONS: Therapeutic exercises, Therapeutic activity, Neuromuscular re-education, Balance training, Gait training, Patient/Family education, Joint mobilization, Stair training, Vestibular training, Orthotic/Fit training, DME instructions, and Manual therapy   PLAN FOR NEXT SESSION:    LTGs due this week, likely plan for D/C.   Continue to work on gait and balance training w/o AD outside on grass.  Continue w/ elliptical vs treadmill incline training. RLE strengthening (esp  quads and hamstring), step ups and step downs, stool/chair scoots with use of resistance- have pt use weak leg only for this to increase difficulty, resisted gait/mini lunges-band around thighs vs waist, work on SLS tasks on RLE (hurdles multi-directional, 3-way hip on airex). balance with   vision removed    Janann August, PT, DPT 05/14/22 8:43 AM   Outpatient Neuro [facility] [address] [place], [place] [address] [phone] 05/14/22, 8:43 AM

## 2022-05-18 ENCOUNTER — Ambulatory Visit: Payer: 59 | Admitting: Physical Therapy

## 2022-05-18 ENCOUNTER — Encounter: Payer: Self-pay | Admitting: Physical Therapy

## 2022-05-18 DIAGNOSIS — R2689 Other abnormalities of gait and mobility: Secondary | ICD-10-CM

## 2022-05-18 DIAGNOSIS — R262 Difficulty in walking, not elsewhere classified: Secondary | ICD-10-CM | POA: Diagnosis not present

## 2022-05-18 DIAGNOSIS — M6281 Muscle weakness (generalized): Secondary | ICD-10-CM

## 2022-05-18 DIAGNOSIS — M25661 Stiffness of right knee, not elsewhere classified: Secondary | ICD-10-CM | POA: Diagnosis not present

## 2022-05-18 DIAGNOSIS — R2681 Unsteadiness on feet: Secondary | ICD-10-CM | POA: Diagnosis not present

## 2022-05-18 NOTE — Therapy (Signed)
OUTPATIENT PHYSICAL THERAPY TREATMENT NOTE   Patient Name: Christine Brady MRN: [id number] DOB:[birth year], 61 y.o., female Today's Date: 05/18/2022  PCP: Ronita Hipps, MD REFERRING PROVIDER: Ronita Hipps, MD   PT End of Session - 05/18/22 0851     Visit Number 23    Number of Visits 24    Date for PT Re-Evaluation 05/27/22    Authorization Type [employer]    PT Start Time [phone]    PT Stop Time 0932    PT Time Calculation (min) 43 min    Equipment Utilized During Treatment Gait belt    Activity Tolerance Patient tolerated treatment well    Behavior During Therapy [facility] for tasks assessed/performed               Past Medical History:  Diagnosis Date   Abdominal pain    Arthritis    difficulty walking   Atypical chest pain 11/11/2016   Bowel obstruction ([place])    Cholesteatoma of right ear    Chronic headaches    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[place]) 11/11/2016   Cyst    back   Hearing aid worn    History of ITP    Hyperlipidemia 11/11/2016   Large ovary 07/09/2019   PONV (postoperative nausea and vomiting)    Reflux    Past Surgical History:  Procedure Laterality Date   CHOLECYSTECTOMY  2010   ESOPHAGOGASTRODUODENOSCOPY (EGD) WITH PROPOFOL N/A 06/12/2019   Procedure: ESOPHAGOGASTRODUODENOSCOPY (EGD) WITH PROPOFOL;  Surgeon: Carol Ada, MD;  [facility];  Service: Endoscopy;  Laterality: N/A;   EXTERNAL EAR SURGERY  2009/2010   EXTERNAL EAR SURGERY  2010   right ear   IR FLUORO GUIDE CV LINE RIGHT  02/10/2022   IR US GUIDE VASC ACCESS RIGHT  02/10/2022   KNEE ARTHROSCOPY     right   KNEE ARTHROSCOPY WITH MEDIAL PATELLAR FEMORAL LIGAMENT RECONSTRUCTION Right 10/05/2021   Procedure: right knee arthroscoy, debridement, lateral menisectomy, medial patellar femoral ligament reconstruction;  Surgeon: Meredith Pel, MD;  Location: [place];  Service: Orthopedics;  Laterality: Right;   KNEE LIGAMENT RECONSTRUCTION      left   MASTOIDECTOMY Right 01/19/2017   Procedure: RIGHT MODIFIED RADICAL MASTOIDECTOMY;  Surgeon: Vicie Mutters, MD;  Location: [place];  Service: ENT;  Laterality: Right;   UPPER ESOPHAGEAL ENDOSCOPIC ULTRASOUND (EUS) N/A 06/12/2019   Procedure: UPPER ESOPHAGEAL ENDOSCOPIC ULTRASOUND (EUS);  Surgeon: Carol Ada, MD;  [facility];  Service: Endoscopy;  Laterality: N/A;   WISDOM TOOTH EXTRACTION     Patient Active Problem List   Diagnosis Date Noted   UTI (urinary tract infection) 02/19/2022   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[place]) 02/09/2022   DVT (deep venous thrombosis) ([place]) 02/09/2022   Deep venous thrombosis of lower extremity ([place]) 12/14/2021   Patellar instability of right knee    Complex tear of lateral meniscus of right knee    Loose body in knee, right knee    Constipation 06/05/2021   Diverticular disease of colon 06/05/2021   Family history of other diseases of the digestive system 06/05/2021   Nausea 06/05/2021   Otalgia, left 06/05/2021   Right upper quadrant pain 06/05/2021   Acute lateral meniscus tear of right knee 06/05/2021   Patellar dislocation, right, sequela 06/05/2021   Diplacusis of left ear 10/02/2020   History of right mastoidectomy 04/10/2020   Postop check 02/29/2020   Bowel obstruction ([place]) 09/28/2019   Idiopathic thrombocytopenia (  [place]) 09/28/2019   Eustachian tube dysfunction, bilateral 09/28/2019   Tinnitus aurium, bilateral 09/28/2019   Tympanic membrane central perforation, 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   Unspecified cholesteatoma, left ear 09/28/2019   IBS (irritable bowel syndrome) 07/09/2019   Headache 07/09/2019   GERD (gastroesophageal reflux disease) 07/09/2019   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 07/03/2019 07/03/2019   Cecal volvulus ([place]) 07/03/2019   Intestinal volvulus ([place]) 07/03/2019   Menopausal syndrome 03/13/2018   History of postoperative nausea 01/19/2017   Atypical  chest pain 11/11/2016   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[place]) 11/11/2016   Epidermoid cyst on back 10/28/2011    REFERRING DIAG: G61.81 (ICD-10-CM) - CIDP (chronic inflammatory demyelinating polyneuropathy) (HCC) G61.0 (ICD-10-CM) - Guillain Barr syndrome ([place]) R27.0 (ICD-10-CM) - Ataxia R26.9 (ICD-10-CM) - Gait abnormality   THERAPY DIAG:  Unsteadiness on feet  Other abnormalities of gait and mobility  Muscle weakness (generalized)  PERTINENT HISTORY: GBS, knee arthroscopy with lateral menisectomy 10/05/2021, HLD, cochlear implant, chronic headaches.  PRECAUTIONS: Fall  SUBJECTIVE: Pt is most challenged with stairs still when weight on RLE behind.  She has been using recumbent bike, treadmill, and climbing stairs at work.  She has some continued anxiety with movement.   PAIN:  Are you having pain? Yes -Pt reports anteromedial joint-line pain during walking (intermittent) described as pinching that is new (started this morning).    TODAY'S TREATMENT:  STRENGTHENING:  Treadmill training 2.52mh at 4% incline x648ms for dynamic cardiovascular warmup and BLE strengthening and ROM.  Brief rest following.  BALANCE/NMR: In grass:  tandem x30 sec each LE; tandem walking x8', backwards walking with head turns x20' CGA-SBA LOB to left x1, pt demos intermittent high guard position with activities  STAIRS:  Level of Assistance: Complete Independence  Stair Negotiation Technique: Alternating Pattern  Forwards with No Rails  Number of Stairs: 4+12   Height of Stairs: 6"  Comments: Pt demos intermittent high guard position of BUE and holds breath resulting in lightheadedness following initial bout.  Cued to exhale during RLE step up and LLE step down with improvement in both pain and lightheadedness.  GAIT: Gait pattern: decreased arm swing- Right, decreased arm swing- Left, and antalgic Distance walked: 400' (grass) +8' (gravel) Assistive device utilized: None Level of  assistance: Complete Independence, SBA, and CGA Comments: Pt has intermittent need for CGA due to instability noted when ambulating down grassy hill initially.  Cued for widened BOS when ascending and descending grassy hill to improve motor control.   PATIENT EDUCATION: Education details: Continue with current HEP and additional exercise like treadmill.  D/C plan for next visit. Person educated: Patient Education method: Explanation Education comprehension: verbalized understanding     HOME EXERCISE PROGRAM: Access Code: [email or web address]      GOALS: Goals reviewed with patient? Yes   SHORT TERM GOALS: Target date: 03/26/2022   Pt will be independent with initial HEP in order to build upon functional gains made in therapy. Baseline: Compliant to HEP. Goal status: MET   2.  Pt will undergo further assessment of FGA with LTG written. Baseline: assessed 03/02/2022 Goal status: MET   3.  Pt will improve AROM of R knee to at least 125 deg in order to demo improved ROM for gait.  Baseline: 125 deg; 129 deg 03/26/2022  Goal status: MET   4.  Pt will perform TUG in 13.5 sec or less with no AD in order to demo decr fall risk.  Baseline: 16.28 sec; 10.09 sec with no AD Goal  status: MET   5.  Pt will improve gait speed with SPC vs. No AD to at least 2.5 ft/sec in order to demo improved community mobility.    Baseline: 2.11 ft/sec with SPC, 11.06 sec with cane: 2.96 ft/sec on 03/24/22 Goal status: MET       LONG TERM GOALS: Target date: 05/27/22   1. Pt will be independent with final HEP in order to build upon functional gains made in therapy. Baseline: 04/23/22: reviewed and advanced HEP today, will benefit from continued advancement as pt progresses Goal status: ONGOING   2.  Pt will improve FGA score to 25/30 in order to demonstrate improved balance and decreased fall risk. Baseline: 18/30 03/02/2022; 23/30 on 04/21/22 Goal status: ONGOING   3.  Pt will improve gait speed with SPC vs. No  AD to at least 2.8 ft/sec in order to demo improved community mobility.  Baseline: 04/21/22 13.34 seconds with no AD = 2.45 ft/sec = limited community ambulator  Goal status: REVISED   4.  Pt will perform 12 steps with supervision with alternating pattern and no railing in order to demo improved stair technique.  Baseline:  Goal status: NEW   5.  Pt will ambulate at least 1,000' over unlevel outdoor surfaces with no AD with mod I over paved/grass surfaces in order to demo improved community mobility.  Baseline: 04/23/22: performed today with no device, supervision Goal status: REVISED      ASSESSMENT:   CLINICAL IMPRESSION: Focus of session today on high level dynamic balance tasks outdoors.  Pt ambulates over grass and gravel including outdoor grassy hill among other NMR tasks.  Continued to address fear of falling during stair tasks and with general gait tasks over compliant grassy surfaces.  She is progressing well towards LTGs and is ready for D/C next visit as additional therapist views appropriate.    OBJECTIVE IMPAIRMENTS Abnormal gait, decreased activity tolerance, decreased balance, decreased endurance, decreased mobility, decreased ROM, decreased strength, hypomobility, impaired flexibility, and impaired sensation.    ACTIVITY LIMITATIONS community activity.    PERSONAL FACTORS Age, Behavior pattern, Past/current experiences, and Time since onset of injury/illness/exacerbation are also affecting patient's functional outcome.      REHAB POTENTIAL: Good   CLINICAL DECISION MAKING: Evolving/moderate complexity   EVALUATION COMPLEXITY: Moderate   PLAN: PT FREQUENCY: 2x/week   PT DURATION: 12 weeks   PLANNED INTERVENTIONS: Therapeutic exercises, Therapeutic activity, Neuromuscular re-education, Balance training, Gait training, Patient/Family education, Joint mobilization, Stair training, Vestibular training, Orthotic/Fit training, DME instructions, and Manual therapy   PLAN  FOR NEXT SESSION:    Assess LTGs-D/C!  Continue to work on gait and balance training w/o AD outside on grass.  Continue w/ elliptical vs treadmill incline training. RLE strengthening (esp quads and hamstring), step ups and step downs, stool/chair scoots with use of resistance- have pt use weak leg only for this to increase difficulty, resisted gait/mini lunges-band around thighs vs waist, work on SLS tasks on RLE (hurdles multi-directional, 3-way hip on airex). balance with vision removed  Elease Etienne, PT, DPT 05/18/22 9:50 AM  Outpatient Neuro [facility] [address], [place], [place] [address] [phone] 05/18/22, 9:50 AM

## 2022-05-19 ENCOUNTER — Encounter: Payer: Self-pay | Admitting: Physical Therapy

## 2022-05-19 ENCOUNTER — Ambulatory Visit: Payer: 59 | Admitting: Physical Therapy

## 2022-05-19 DIAGNOSIS — M6281 Muscle weakness (generalized): Secondary | ICD-10-CM | POA: Diagnosis not present

## 2022-05-19 DIAGNOSIS — R2681 Unsteadiness on feet: Secondary | ICD-10-CM

## 2022-05-19 DIAGNOSIS — M25661 Stiffness of right knee, not elsewhere classified: Secondary | ICD-10-CM | POA: Diagnosis not present

## 2022-05-19 DIAGNOSIS — R2689 Other abnormalities of gait and mobility: Secondary | ICD-10-CM | POA: Diagnosis not present

## 2022-05-19 DIAGNOSIS — R262 Difficulty in walking, not elsewhere classified: Secondary | ICD-10-CM | POA: Diagnosis not present

## 2022-05-19 NOTE — Therapy (Signed)
OUTPATIENT PHYSICAL THERAPY TREATMENT NOTE/DISCHARGE SUMMARY   Patient Name: Christine Brady MRN: [id number] DOB:[birth year], 61 y.o., female Today's Date: 05/19/2022  PCP: Ronita Hipps, MD REFERRING PROVIDER: Melvenia Beam, MD   PT End of Session - 05/19/22 0804     Visit Number 24    Number of Visits 24    Date for PT Re-Evaluation 05/27/22    Authorization Type [employer]    PT Start Time [phone]    PT Stop Time [phone]   full time not used due to D/C visit   PT Time Calculation (min) 32 min    Equipment Utilized During Treatment --    Activity Tolerance Patient tolerated treatment well    Behavior During Therapy [facility] for tasks assessed/performed                Past Medical History:  Diagnosis Date   Abdominal pain    Arthritis    difficulty walking   Atypical chest pain 11/11/2016   Bowel obstruction ([place])    Cholesteatoma of right ear    Chronic headaches    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[place]) 11/11/2016   Cyst    back   Hearing aid worn    History of ITP    Hyperlipidemia 11/11/2016   Large ovary 07/09/2019   PONV (postoperative nausea and vomiting)    Reflux    Past Surgical History:  Procedure Laterality Date   CHOLECYSTECTOMY  2010   ESOPHAGOGASTRODUODENOSCOPY (EGD) WITH PROPOFOL N/A 06/12/2019   Procedure: ESOPHAGOGASTRODUODENOSCOPY (EGD) WITH PROPOFOL;  Surgeon: Carol Ada, MD;  Location: [facility];  Service: Endoscopy;  Laterality: N/A;   EXTERNAL EAR SURGERY  2009/2010   EXTERNAL EAR SURGERY  2010   right ear   IR FLUORO GUIDE CV LINE RIGHT  02/10/2022   IR US GUIDE VASC ACCESS RIGHT  02/10/2022   KNEE ARTHROSCOPY     right   KNEE ARTHROSCOPY WITH MEDIAL PATELLAR FEMORAL LIGAMENT RECONSTRUCTION Right 10/05/2021   Procedure: right knee arthroscoy, debridement, lateral menisectomy, medial patellar femoral ligament reconstruction;  Surgeon: Meredith Pel, MD;  Location: [place];  Service: Orthopedics;   Laterality: Right;   KNEE LIGAMENT RECONSTRUCTION     left   MASTOIDECTOMY Right 01/19/2017   Procedure: RIGHT MODIFIED RADICAL MASTOIDECTOMY;  Surgeon: Vicie Mutters, MD;  Location: [place];  Service: ENT;  Laterality: Right;   UPPER ESOPHAGEAL ENDOSCOPIC ULTRASOUND (EUS) N/A 06/12/2019   Procedure: UPPER ESOPHAGEAL ENDOSCOPIC ULTRASOUND (EUS);  Surgeon: Carol Ada, MD;  [facility];  Service: Endoscopy;  Laterality: N/A;   WISDOM TOOTH EXTRACTION     Patient Active Problem List   Diagnosis Date Noted   UTI (urinary tract infection) 02/19/2022   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[place]) 02/09/2022   DVT (deep venous thrombosis) ([place]) 02/09/2022   Deep venous thrombosis of lower extremity ([place]) 12/14/2021   Patellar instability of right knee    Complex tear of lateral meniscus of right knee    Loose body in knee, right knee    Constipation 06/05/2021   Diverticular disease of colon 06/05/2021   Family history of other diseases of the digestive system 06/05/2021   Nausea 06/05/2021   Otalgia, left 06/05/2021   Right upper quadrant pain 06/05/2021   Acute lateral meniscus tear of right knee 06/05/2021   Patellar dislocation, right, sequela 06/05/2021   Diplacusis of left ear 10/02/2020   History of right mastoidectomy 04/10/2020   Postop check 02/29/2020  Bowel obstruction ([place]) 09/28/2019   Idiopathic thrombocytopenia ([place]) 09/28/2019   Eustachian tube dysfunction, bilateral 09/28/2019   Tinnitus aurium, bilateral 09/28/2019   Tympanic membrane central perforation, 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   Unspecified cholesteatoma, left ear 09/28/2019   IBS (irritable bowel syndrome) 07/09/2019   Headache 07/09/2019   GERD (gastroesophageal reflux disease) 07/09/2019   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 07/03/2019 07/03/2019   Cecal volvulus ([place]) 07/03/2019   Intestinal volvulus ([place]) 07/03/2019   Menopausal syndrome 03/13/2018   History  of postoperative nausea 01/19/2017   Atypical chest pain 11/11/2016   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[place]) 11/11/2016   Epidermoid cyst on back 10/28/2011    REFERRING DIAG: G61.81 (ICD-10-CM) - CIDP (chronic inflammatory demyelinating polyneuropathy) (HCC) G61.0 (ICD-10-CM) - Guillain Barr syndrome ([place]) R27.0 (ICD-10-CM) - Ataxia R26.9 (ICD-10-CM) - Gait abnormality   THERAPY DIAG:  Unsteadiness on feet  Muscle weakness (generalized)  Other abnormalities of gait and mobility  PERTINENT HISTORY: GBS, knee arthroscopy with lateral menisectomy 10/05/2021, HLD, cochlear implant, chronic headaches.  PRECAUTIONS: Fall  SUBJECTIVE: Reports that RLE was hurting after the session yesterday.   PAIN:  Are you having pain? No    TODAY'S TREATMENT:  Goal assessment: 10.94 seconds= 2.99 ft/sec with no AD    [facility] PT Assessment - 05/19/22 0807       Functional Gait  Assessment   Gait assessed  Yes    Gait Level Surface Walks 20 ft in less than 7 sec but greater than 5.5 sec, uses assistive device, slower speed, mild gait deviations, or deviates 6-10 in outside of the 12 in walkway width.   6.9   Change in Gait Speed Able to smoothly change walking speed without loss of balance or gait deviation. Deviate no more than 6 in outside of the 12 in walkway width.    Gait with Horizontal Head Turns Performs head turns smoothly with no change in gait. Deviates no more than 6 in outside 12 in walkway width    Gait with Vertical Head Turns Performs head turns with no change in gait. Deviates no more than 6 in outside 12 in walkway width.    Gait and Pivot Turn Pivot turns safely within 3 sec and stops quickly with no loss of balance.    Step Over Obstacle Is able to step over 2 stacked shoe boxes taped together (9 in total height) without changing gait speed. No evidence of imbalance.    Gait with Narrow Base of Support Is able to ambulate for 10 steps heel to toe with no staggering.     Gait with Eyes Closed Walks 20 ft, slow speed, abnormal gait pattern, evidence for imbalance, deviates 10-15 in outside 12 in walkway width. Requires more than 9 sec to ambulate 20 ft.   11 seconds   Ambulating Backwards Walks 20 ft, no assistive devices, good speed, no evidence for imbalance, normal gait    Steps Alternating feet, no rail.    Total Score 27    FGA comment: Low fall risk              STAIRS:  Level of Assistance: Complete Independence  Stair Negotiation Technique: Alternating Pattern  Forwards with No Rails  Number of Stairs: 4 x 3 reps = 12 total    Height of Stairs: 6"  Comments:  Reviewed cues to exhale during RLE step up and LLE step down with improvement in both pain and lightheadedness. Pt has been practicing this at work.  GAIT: Gait pattern: decreased arm swing- Right, decreased arm swing- Left, and antalgic Distance walked: 1,000' Assistive device utilized: None Level of assistance: Complete Independence and Modified independence Comments: On grass and paved surfaces outdoors.    Access Code: [email or web address] URL: [email or web address] Date: 05/19/2022 Prepared by: Janann August  Reviewed final HEP for strength, ROM, balance. See MedBridge for more details.   Exercises - Staggered Sit-to-Stand  - 1 x daily - 5 x weekly - 2 sets - 12 reps - Narrow Stance with Eyes Closed on Foam Pad  - 1 x daily - 5 x weekly - 1 sets - 2 reps - 30 seconds hold - Tandem Stance with Eyes Closed  - 1 x daily - 5 x weekly - 1 sets - 2 reps - 30 seconds hold - Mini Squat with Chair  - 1 x daily - 5 x weekly - 2 sets - 10 reps - 3 hold - Backward Tandem Walking with Counter Support  - 1 x daily - 5 x weekly - 1 sets - 4-6 reps - Standing Hamstring Curl with Resistance  - 1 x daily - 5 x weekly - 2 sets - 10 reps - Standing Quadriceps Stretch  - 1 x daily - 5 x weekly - 3 sets - 30 hold - Step Up  - 1 x daily - 5 x weekly - 1-2 sets - 10 reps   PHYSICAL  THERAPY DISCHARGE SUMMARY  Visits from Start of Care: 24  Current functional level related to goals / functional outcomes: See LTGs   Remaining deficits: Impaired RLE strength, impaired high level balance.    Education / Equipment: HEP, walking/treadmill program    Patient agrees to discharge. Patient goals were met. Patient is being discharged due to meeting the stated rehab goals.    PATIENT EDUCATION: Education details: D/C, progress towards goals, verbally reviewed HEP.  Person educated: Patient Education method: Explanation Education comprehension: verbalized understanding     HOME EXERCISE PROGRAM: Access Code: [email or web address]      GOALS: Goals reviewed with patient? Yes   SHORT TERM GOALS: Target date: 03/26/2022   Pt will be independent with initial HEP in order to build upon functional gains made in therapy. Baseline: Compliant to HEP. Goal status: MET   2.  Pt will undergo further assessment of FGA with LTG written. Baseline: assessed 03/02/2022 Goal status: MET   3.  Pt will improve AROM of R knee to at least 125 deg in order to demo improved ROM for gait.  Baseline: 125 deg; 129 deg 03/26/2022  Goal status: MET   4.  Pt will perform TUG in 13.5 sec or less with no AD in order to demo decr fall risk.  Baseline: 16.28 sec; 10.09 sec with no AD Goal status: MET   5.  Pt will improve gait speed with SPC vs. No AD to at least 2.5 ft/sec in order to demo improved community mobility.    Baseline: 2.11 ft/sec with SPC, 11.06 sec with cane: 2.96 ft/sec on 03/24/22 Goal status: MET       LONG TERM GOALS: Target date: 05/27/22   1. Pt will be independent with final HEP in order to build upon functional gains made in therapy. Baseline: reviewed final HEP on 05/19/22 Goal status: MET    2.  Pt will improve FGA score to 25/30 in order to demonstrate improved balance and decreased fall risk. Baseline: 18/30 03/02/2022; 23/30 on 04/21/22;   27/30 on 05/19/22 Goal status:  MET  3.  Pt will improve gait speed with SPC vs. No AD to at least 2.8 ft/sec in order to demo improved community mobility.  Baseline: 04/21/22 13.34 seconds with no AD = 2.45 ft/sec = limited community ambulator   10.94 seconds= 2.99 ft/sec with no AD Goal status: MET    4.  Pt will perform 12 steps with supervision with alternating pattern and no railing in order to demo improved stair technique.  Baseline:  Goal status: MET   5.  Pt will ambulate at least 1,000' over unlevel outdoor surfaces with no AD with mod I over paved/grass surfaces in order to demo improved community mobility.  Baseline: met on 06/18/22 Goal status: MET   ASSESSMENT:   CLINICAL IMPRESSION: Today's skilled session focused on assessing pt's LTGs. Pt has met all 6 LTGs with improvements in gait speed, FGA score, gait outdoors, and stairs (see above). Reviewed pt's final HEP with pt to continue working on at home as well as use treadmill and gym at her work. Pt is pleased with her progress and will be discharged from PT at this time. Pt in agreement with plan.    OBJECTIVE IMPAIRMENTS Abnormal gait, decreased activity tolerance, decreased balance, decreased endurance, decreased mobility, decreased ROM, decreased strength, hypomobility, impaired flexibility, and impaired sensation.    ACTIVITY LIMITATIONS community activity.    PERSONAL FACTORS Age, Behavior pattern, Past/current experiences, and Time since onset of injury/illness/exacerbation are also affecting patient's functional outcome.      REHAB POTENTIAL: Good   CLINICAL DECISION MAKING: Evolving/moderate complexity   EVALUATION COMPLEXITY: Moderate   PLAN: PT FREQUENCY: 2x/week   PT DURATION: 12 weeks   PLANNED INTERVENTIONS: Therapeutic exercises, Therapeutic activity, Neuromuscular re-education, Balance training, Gait training, Patient/Family education, Joint mobilization, Stair training, Vestibular training, Orthotic/Fit training, DME  instructions, and Manual therapy   PLAN FOR NEXT SESSION:    D/C   Janann August, PT, DPT 05/19/22 8:39 AM   [facility] [address], [place], [place] [address] [phone] 05/19/22, 8:39 AM

## 2022-05-21 ENCOUNTER — Ambulatory Visit: Payer: 59 | Admitting: Physical Therapy

## 2022-05-27 ENCOUNTER — Other Ambulatory Visit (HOSPITAL_COMMUNITY): Payer: Self-pay

## 2022-05-27 MED ORDER — APIXABAN 5 MG PO TABS
ORAL_TABLET | ORAL | 2 refills | Status: DC
  Filled 2022-05-27: qty 60, 30d supply, fill #0

## 2022-06-03 ENCOUNTER — Inpatient Hospital Stay: Payer: 59 | Attending: Oncology

## 2022-06-03 ENCOUNTER — Inpatient Hospital Stay (HOSPITAL_BASED_OUTPATIENT_CLINIC_OR_DEPARTMENT_OTHER): Payer: 59 | Admitting: Oncology

## 2022-06-03 VITALS — BP 110/88 | HR 71 | Temp 98.1°F | Resp 18 | Ht 62.0 in | Wt 141.2 lb

## 2022-06-03 DIAGNOSIS — D693 Immune thrombocytopenic purpura: Secondary | ICD-10-CM | POA: Insufficient documentation

## 2022-06-03 DIAGNOSIS — Z7901 Long term (current) use of anticoagulants: Secondary | ICD-10-CM | POA: Diagnosis not present

## 2022-06-03 DIAGNOSIS — Z86718 Personal history of other venous thrombosis and embolism: Secondary | ICD-10-CM | POA: Diagnosis not present

## 2022-06-03 DIAGNOSIS — Z79899 Other long term (current) drug therapy: Secondary | ICD-10-CM | POA: Insufficient documentation

## 2022-06-03 LAB — CBC WITH DIFFERENTIAL (CANCER CENTER ONLY)
Abs Immature Granulocytes: 0.01 10*3/uL (ref 0.00–0.07)
Basophils Absolute: 0 10*3/uL (ref 0.0–0.1)
Basophils Relative: 1 %
Eosinophils Absolute: 0.1 10*3/uL (ref 0.0–0.5)
Eosinophils Relative: 1 %
HCT: 40.1 % (ref 36.0–46.0)
Hemoglobin: 13.2 g/dL (ref 12.0–15.0)
Immature Granulocytes: 0 %
Lymphocytes Relative: 51 %
Lymphs Abs: 1.8 10*3/uL (ref 0.7–4.0)
MCH: 31 pg (ref 26.0–34.0)
MCHC: 32.9 g/dL (ref 30.0–36.0)
MCV: 94.1 fL (ref 80.0–100.0)
Monocytes Absolute: 0.3 10*3/uL (ref 0.1–1.0)
Monocytes Relative: 9 %
Neutro Abs: 1.4 10*3/uL — ABNORMAL LOW (ref 1.7–7.7)
Neutrophils Relative %: 38 %
Platelet Count: 110 10*3/uL — ABNORMAL LOW (ref 150–400)
RBC: 4.26 MIL/uL (ref 3.87–5.11)
RDW: 12.5 % (ref 11.5–15.5)
WBC Count: 3.6 10*3/uL — ABNORMAL LOW (ref 4.0–10.5)
nRBC: 0 % (ref 0.0–0.2)

## 2022-06-03 NOTE — Addendum Note (Signed)
Addended by: Kelli Hope on: 06/03/2022 03:13 PM   Modules accepted: Orders

## 2022-06-07 ENCOUNTER — Ambulatory Visit (INDEPENDENT_AMBULATORY_CARE_PROVIDER_SITE_OTHER): Payer: 59 | Admitting: Orthopedic Surgery

## 2022-06-07 DIAGNOSIS — R29898 Other symptoms and signs involving the musculoskeletal system: Secondary | ICD-10-CM

## 2022-06-10 ENCOUNTER — Encounter: Payer: Self-pay | Admitting: Orthopedic Surgery

## 2022-06-10 NOTE — Progress Notes (Signed)
Office Visit Note   Patient: Christine Brady           Date of Birth: September 03, 1961           MRN: [id number] Visit Date: 06/07/2022 Requested by: Ronita Hipps, MD [address] [place] [address],  PCP: Ronita Hipps, MD  Subjective: Chief Complaint  Patient presents with   Right Knee - Follow-up    right knee arthroscopy with partial lateral meniscectomy and MPFL reconstruction on 10/01/2021    HPI: Christine Brady is a 61 year old patient underwent right knee arthroscopy with MPFL reconstruction 10/01/2021.  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  That did slow down her rehab.  Not really strong enough yet to do stairs but overall she is making progress with range of motion.  She has been doing a lot of balance rehabilitation but not strengthening rehabilitation.  Still is on a blood thinner until the end of July.              ROS: All systems reviewed are negative as they relate to the chief complaint within the history of present illness.  Patient denies  fevers or chills.   Assessment & Plan: Visit Diagnoses:  1. Weakness of both lower extremities     Plan: Impression is making good progress with range of motion but still needs some strengthening.  She has about 2 cm of atrophy right versus left in the quad.  Plan is physical therapy and drawl bridge bilateral lower extremities for quad and hamstring strengthening 3 times a week for 6 weeks.  33-monthreturn for final check.  Follow-Up Instructions: Return in about 3 months (around 09/07/2022).   Orders:  Orders Placed This Encounter  Procedures   Ambulatory referral to Physical Therapy   No orders of the defined types were placed in this encounter.     Procedures: No procedures performed   Clinical Data: No additional findings.  Objective: Vital Signs: There were no vitals taken for this visit.  Physical Exam:   Constitutional: Patient appears well-developed HEENT:  Head:  Normocephalic Eyes:EOM are normal Neck: Normal range of motion Cardiovascular: Normal rate Pulmonary/chest: Effort normal Neurologic: Patient is alert Skin: Skin is warm Psychiatric: Patient has normal mood and affect   Ortho Exam: Ortho exam demonstrates range of motion 0-1 15 which is an improvement.  No effusion in the knee.  No calf tenderness negative Homans.  Ankle dorsiflexion plantarflexion strength quad and hamstring strength improving.  Hip flexion strength also symmetric bilaterally.  No patella crepitus on the right with active or passive range of motion and very good lateral stability to lateral stress.  Specialty Comments:  No specialty comments available.  Imaging: No results found.   PMFS History: Patient Active Problem List   Diagnosis Date Noted   UTI (urinary tract infection) 02/19/2022   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[place] 02/09/2022   DVT (deep venous thrombosis) ([place] 02/09/2022   Deep venous thrombosis of lower extremity ([place] 12/14/2021   Patellar instability of right knee    Complex tear of lateral meniscus of right knee    Loose body in knee, right knee    Constipation 06/05/2021   Diverticular disease of colon 06/05/2021   Family history of other diseases of the digestive system 06/05/2021   Nausea 06/05/2021   Otalgia, left 06/05/2021   Right upper quadrant pain 06/05/2021   Acute lateral meniscus tear of right knee 06/05/2021   Patellar dislocation, right, sequela 06/05/2021  Diplacusis of left ear 10/02/2020   History of right mastoidectomy 04/10/2020   Postop check 02/29/2020   Bowel obstruction ([place]) 09/28/2019   Idiopathic thrombocytopenia ([place]) 09/28/2019   Eustachian tube dysfunction, bilateral 09/28/2019   Tinnitus aurium, bilateral 09/28/2019   Tympanic membrane central perforation, 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   Unspecified cholesteatoma, left ear 09/28/2019   IBS (irritable bowel syndrome) 07/09/2019   Headache  07/09/2019   GERD (gastroesophageal reflux disease) 07/09/2019   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 07/03/2019 07/03/2019   Cecal volvulus ([place]) 07/03/2019   Intestinal volvulus ([place]) 07/03/2019   Menopausal syndrome 03/13/2018   History of postoperative nausea 01/19/2017   Atypical chest pain 11/11/2016   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[place]) 11/11/2016   Epidermoid cyst on back 10/28/2011   Past Medical History:  Diagnosis Date   Abdominal pain    Arthritis    difficulty walking   Atypical chest pain 11/11/2016   Bowel obstruction ([facility])    Cholesteatoma of right ear    Chronic headaches    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[place]) 11/11/2016   Cyst    back   Hearing aid worn    History of ITP    Hyperlipidemia 11/11/2016   Large ovary 07/09/2019   PONV (postoperative nausea and vomiting)    Reflux     Family History  Problem Relation Age of Onset   Diabetes Mother    Asthma Mother    Diabetes type II Mother    Alcohol abuse Mother    Hypertension Mother    Diabetes Father    Hypertension Father    Kidney disease Father    Diabetes type II Father    Alcohol abuse Father    Crohn's disease Sister    COPD Sister    Thrombocytopenia Sister    Cerebral palsy Brother    Neuropathy Neg Hx     Past Surgical History:  Procedure Laterality Date   CHOLECYSTECTOMY  2010   ESOPHAGOGASTRODUODENOSCOPY (EGD) WITH PROPOFOL N/A 06/12/2019   Procedure: ESOPHAGOGASTRODUODENOSCOPY (EGD) WITH PROPOFOL;  Surgeon: [place] Ada, MD;  [facility];  Service: Endoscopy;  Laterality: N/A;   EXTERNAL EAR SURGERY  2009/2010   EXTERNAL EAR SURGERY  2010   right ear   IR FLUORO GUIDE CV LINE RIGHT  02/10/2022   IR US GUIDE VASC ACCESS RIGHT  02/10/2022   KNEE ARTHROSCOPY     right   KNEE ARTHROSCOPY WITH MEDIAL PATELLAR FEMORAL LIGAMENT RECONSTRUCTION Right 10/05/2021   Procedure: right knee arthroscoy, debridement, lateral menisectomy, medial  patellar femoral ligament reconstruction;  Surgeon: Meredith Pel, MD;  Location: [place];  Service: Orthopedics;  Laterality: Right;   KNEE LIGAMENT RECONSTRUCTION     left   MASTOIDECTOMY Right 01/19/2017   Procedure: RIGHT MODIFIED RADICAL MASTOIDECTOMY;  Surgeon: Vicie Mutters, MD;  Location: [facility];  Service: ENT;  Laterality: Right;   UPPER ESOPHAGEAL ENDOSCOPIC ULTRASOUND (EUS) N/A 06/12/2019   Procedure: UPPER ESOPHAGEAL ENDOSCOPIC ULTRASOUND (EUS);  Surgeon: [place] Ada, MD;  [facility];  Service: Endoscopy;  Laterality: N/A;   WISDOM TOOTH EXTRACTION     Social History   Occupational History   Not on file  Tobacco Use   Smoking status: Never   Smokeless tobacco: Never  Vaping Use   Vaping Use: Never used  Substance and Sexual Activity   Alcohol use: Yes    Comment: occas   Drug use: No   Sexual activity: Not on file

## 2022-06-22 ENCOUNTER — Encounter: Payer: Self-pay | Admitting: *Deleted

## 2022-06-22 ENCOUNTER — Other Ambulatory Visit (HOSPITAL_COMMUNITY): Payer: Self-pay

## 2022-06-22 NOTE — Progress Notes (Signed)
Received staff message from Dr. Sarina Ill that undetermined if flu vaccine or shingles vaccine was cause of her Guillain-Barre syndrome, but she recommends patient avoid both.

## 2022-07-01 ENCOUNTER — Inpatient Hospital Stay: Payer: 59 | Attending: Oncology

## 2022-07-01 ENCOUNTER — Telehealth: Payer: Self-pay | Admitting: *Deleted

## 2022-07-01 DIAGNOSIS — Z86718 Personal history of other venous thrombosis and embolism: Secondary | ICD-10-CM | POA: Insufficient documentation

## 2022-07-01 DIAGNOSIS — D693 Immune thrombocytopenic purpura: Secondary | ICD-10-CM | POA: Diagnosis not present

## 2022-07-01 LAB — CBC WITH DIFFERENTIAL (CANCER CENTER ONLY)
Abs Immature Granulocytes: 0 10*3/uL (ref 0.00–0.07)
Basophils Absolute: 0 10*3/uL (ref 0.0–0.1)
Basophils Relative: 1 %
Eosinophils Absolute: 0 10*3/uL (ref 0.0–0.5)
Eosinophils Relative: 1 %
HCT: 39 % (ref 36.0–46.0)
Hemoglobin: 13 g/dL (ref 12.0–15.0)
Immature Granulocytes: 0 %
Lymphocytes Relative: 47 %
Lymphs Abs: 1.6 10*3/uL (ref 0.7–4.0)
MCH: 31.6 pg (ref 26.0–34.0)
MCHC: 33.3 g/dL (ref 30.0–36.0)
MCV: 94.7 fL (ref 80.0–100.0)
Monocytes Absolute: 0.3 10*3/uL (ref 0.1–1.0)
Monocytes Relative: 9 %
Neutro Abs: 1.4 10*3/uL — ABNORMAL LOW (ref 1.7–7.7)
Neutrophils Relative %: 42 %
Platelet Count: 121 10*3/uL — ABNORMAL LOW (ref 150–400)
RBC: 4.12 MIL/uL (ref 3.87–5.11)
RDW: 12.9 % (ref 11.5–15.5)
WBC Count: 3.3 10*3/uL — ABNORMAL LOW (ref 4.0–10.5)
nRBC: 0 % (ref 0.0–0.2)

## 2022-07-01 LAB — D-DIMER, QUANTITATIVE: D-Dimer, Quant: 0.48 ug/mL-FEU (ref 0.00–0.50)

## 2022-07-01 NOTE — Telephone Encounter (Signed)
Patient inquired about status of her PT referral Dr. Benay Spice was going to order at last visit. Has not heard from them. Noted Dr. Benay Spice made referral on 6/22 and Dr. Marlou Sa made referral on 6/26. Patient notified via VM and provided phone # to call to f/u on referral [phone].

## 2022-07-02 ENCOUNTER — Telehealth: Payer: Self-pay

## 2022-07-02 ENCOUNTER — Other Ambulatory Visit (HOSPITAL_COMMUNITY): Payer: Self-pay

## 2022-07-02 ENCOUNTER — Encounter: Payer: Self-pay | Admitting: Oncology

## 2022-07-02 NOTE — Telephone Encounter (Signed)
-----   Message from Ladell Pier, MD sent at 07/01/2022  6:01 PM EDT ----- Please call patient, the D-dimer is negative, platelets are stable, follow-up as scheduled

## 2022-07-02 NOTE — Telephone Encounter (Signed)
V/M message left with results. Pt informed to return call with any problems or concerns.

## 2022-07-08 ENCOUNTER — Other Ambulatory Visit (HOSPITAL_COMMUNITY): Payer: Self-pay

## 2022-07-13 ENCOUNTER — Encounter: Payer: Self-pay | Admitting: Gastroenterology

## 2022-07-13 ENCOUNTER — Ambulatory Visit (INDEPENDENT_AMBULATORY_CARE_PROVIDER_SITE_OTHER): Payer: 59 | Admitting: Gastroenterology

## 2022-07-13 VITALS — BP 126/68 | HR 75 | Ht 62.0 in | Wt 145.0 lb

## 2022-07-13 DIAGNOSIS — K6389 Other specified diseases of intestine: Secondary | ICD-10-CM | POA: Diagnosis not present

## 2022-07-13 DIAGNOSIS — R140 Abdominal distension (gaseous): Secondary | ICD-10-CM | POA: Diagnosis not present

## 2022-07-13 DIAGNOSIS — R109 Unspecified abdominal pain: Secondary | ICD-10-CM | POA: Diagnosis not present

## 2022-07-13 NOTE — Progress Notes (Signed)
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 She also has noticed halitosis.  She did not fill the Xifaxan.  She was hospitalized in May 2022 with partial small bowel obstruction secondary to adhesions   She is having 1-2 bowel movements daily, taking Miralax 1 capful daily.       Relevant GI Hx:   CT abd & Pelvis with contrast 11/14/19 Stomach/Bowel: Stable postop changes from right hemicolectomy. No evidence of bowel wall thickening or bowel obstruction. A rounded fat attenuation lesion contain internal soft tissue stranding is again seen in the right lower quadrant in the region of the paracolic gutter. This measures 4.1 x 2.8 cm, decreased in size from 5.2 x 4.5 cm on prior exam. This is consistent with resolving fat necrosis.     EGD 05/21/2019: Normal esophagus. Fundic gland polyps removed.   Colonoscopy Normal, due for recall colonoscopy in 2025. Unable to retrieve the report. Will obtain report from Dr.Mann   EGD with EUS 06/12/19: Normal EGD. Mild dilation CBD 47m s/p cholecystectomy.Normal pancreas.   Robotic proximal colectomy 07/03/19 for intermittent volvulus     Outpatient Encounter Medications as of 07/13/2022  Medication Sig   Calcium Carbonate-Vitamin D3 600-400 MG-UNIT TABS Take 2 tablets by mouth daily.   dicyclomine (BENTYL) 10 MG capsule Take 1 capsule (10 mg total) by mouth every 8 (eight) hours as needed for  spasms.   Hyoscyamine Sulfate SL 0.125 MG SUBL Place 0.125 mg under the tongue daily as needed (for stomach muscle cramping).   Multiple Vitamins-Calcium (ONE-A-DAY WOMENS FORMULA) TABS Take 1 tablet by mouth daily with breakfast.   ondansetron (ZOFRAN) 4 MG tablet Take 1 tablet (4 mg total) by mouth every 8 (eight) hours as needed for nausea or vomiting.   polyethylene glycol powder (GLYCOLAX/MIRALAX) 17 GM/SCOOP powder Take 1 scoop mixed in 8 oz of fluid daily (Patient taking differently: Take 17 g by mouth See admin instructions. Mix 17 grams of powder into 8 ounces of water and drink every morning)   Probiotic Product (PROBIOTIC PO) Take 1 capsule by mouth in the morning.   rosuvastatin (CRESTOR) 5 MG tablet Take 1 tablet (5 mg total) by mouth 2 times a week   [DISCONTINUED] apixaban (ELIQUIS) 5 MG TABS tablet Take 2 tablet by mouth 2 times a day for 7 days then 1 tablet by mouth 2 times a day (Patient taking differently: Take 5 mg by mouth in the morning and at bedtime.)   [DISCONTINUED] Meth-Hyo-M Bl-Na Phos-Ph Sal (URIBEL) 118 MG CAPS Take 1 capsule by mouth 4 times per day for 14 days for symptoms of uropathy (Patient not taking: Reported on 06/03/2022)   No facility-administered encounter medications on file as of 07/13/2022.    Allergies as of 07/13/2022 - Review Complete 06/10/2022  Allergen Reaction Noted   Nsaids Other (See Comments) 02/09/2022   Oxycodone Other (See Comments) 06/04/2019   Influenza vaccines Other (  See Comments) 06/22/2022   Shingrix [zoster vac recomb adjuvanted] Other (See Comments) 06/22/2022   Claritin-d [loratadine-pseudoephedrine er] Rash 10/27/2011   Codeine Rash 10/27/2011   Hydrocodone-acetaminophen Itching 11/13/2021   Loratadine Rash 04/27/2019   Lyrica [pregabalin] Other (See Comments) 02/09/2022    Past Medical History:  Diagnosis Date   Abdominal pain    Arthritis    difficulty walking   Atypical chest pain 11/11/2016   Bowel obstruction ([place])     Cholesteatoma of right ear    Chronic headaches    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[place]) 11/11/2016   Cyst    back   Hearing aid worn    History of ITP    Hyperlipidemia 11/11/2016   Large ovary 07/09/2019   PONV (postoperative nausea and vomiting)    Reflux     Past Surgical History:  Procedure Laterality Date   CHOLECYSTECTOMY  2010   ESOPHAGOGASTRODUODENOSCOPY (EGD) WITH PROPOFOL N/A 06/12/2019   Procedure: ESOPHAGOGASTRODUODENOSCOPY (EGD) WITH PROPOFOL;  Surgeon: Carol Ada, MD;  Location: [facility];  Service: Endoscopy;  Laterality: N/A;   EXTERNAL EAR SURGERY  2009/2010   EXTERNAL EAR SURGERY  2010   right ear   IR FLUORO GUIDE CV LINE RIGHT  02/10/2022   IR US GUIDE VASC ACCESS RIGHT  02/10/2022   KNEE ARTHROSCOPY     right   KNEE ARTHROSCOPY WITH MEDIAL PATELLAR FEMORAL LIGAMENT RECONSTRUCTION Right 10/05/2021   Procedure: right knee arthroscoy, debridement, lateral menisectomy, medial patellar femoral ligament reconstruction;  Surgeon: Meredith Pel, MD;  Location: [place];  Service: Orthopedics;  Laterality: Right;   KNEE LIGAMENT RECONSTRUCTION     left   MASTOIDECTOMY Right 01/19/2017   Procedure: RIGHT MODIFIED RADICAL MASTOIDECTOMY;  Surgeon: Vicie Mutters, MD;  Location: [place];  Service: ENT;  Laterality: Right;   UPPER ESOPHAGEAL ENDOSCOPIC ULTRASOUND (EUS) N/A 06/12/2019   Procedure: UPPER ESOPHAGEAL ENDOSCOPIC ULTRASOUND (EUS);  Surgeon: Carol Ada, MD;  Location: [facility];  Service: Endoscopy;  Laterality: N/A;   WISDOM TOOTH EXTRACTION      Family History  Problem Relation Age of Onset   Diabetes Mother    Asthma Mother    Diabetes type II Mother    Alcohol abuse Mother    Hypertension Mother    Diabetes Father    Hypertension Father    Kidney disease Father    Diabetes type II Father    Alcohol abuse Father    Crohn's disease Sister    COPD Sister    Thrombocytopenia Sister    Cerebral  palsy Brother    Neuropathy Neg Hx     Social History   Socioeconomic History   Marital status: Single    Spouse name: Not on file   Number of children: Not on file   Years of education: Not on file   Highest education level: Not on file  Occupational History   Not on file  Tobacco Use   Smoking status: Never   Smokeless tobacco: Never  Vaping Use   Vaping Use: Never used  Substance and Sexual Activity   Alcohol use: Yes    Comment: occas   Drug use: No   Sexual activity: Not on file  Other Topics Concern   Not on file  Social History Narrative   Not on file   Social Determinants of Health   Financial Resource Strain: Not on file  Food Insecurity: Not on file  Transportation Needs: Not on file  Physical Activity:  Not on file  Stress: Not on file  Social Connections: Not on file  Intimate Partner Violence: Not on file      Review of systems: All other review of systems negative except as mentioned in the HPI.   Physical Exam: Vitals:   07/13/22 1024  BP: 126/68  Pulse: 75   Body mass index is 26.52 kg/m. Gen:      No acute distress HEENT:  sclera anicteric Abd:      soft, non-tender; no palpable masses, no distension Ext:    No edema Neuro: alert and oriented x 3 Psych: normal mood and affect  Data Reviewed:  Reviewed labs, radiology imaging, old records and pertinent past GI work up   Assessment and Plan/Recommendations:  61 year old very pleasant female with history of IBS, constipation status post proximal colectomy 06/2019 for chronic intermittent cecal volvulus with  c/o RUQ abd pain,  nausea, abdominal bloating and halitosis   She has h/o fat necrosis and s/p proximal colectomy Her symptoms are consistent with small intestinal bacterial overgrowth secondary to extensive intestinal adhesions and partial SBO, will empirically treat with 2 week course of Rifaximin '550mg'$  TID Advised patient to stop using probiotic capsules and try to increase  intake of fruits and vegetables.  Consume foods with natural pre and probiotics     IBS-constipation: Use Miralax 1/2 capful BID and titrate based on response to have 1-2 soft BM per day  Use dicyclomine 10 mg every 8 hours as needed for abdominal cramping   Return in 6 months   The patient was provided an opportunity to ask questions and all were answered. The patient agreed with the plan and demonstrated an understanding of the instructions.  Damaris Hippo , MD    CC: Ronita Hipps, MD

## 2022-07-13 NOTE — Patient Instructions (Signed)
We have given you to samples of Xifaxian 550 mg 1 three times a day for 10 days  STOP Probiotics  Drink Kefir yogurt  Increase fruits and vegetables   Follow up in 6 months  If you are age 60 or older, your body mass index should be between 23-30. Your Body mass index is 26.52 kg/m. If this is out of the aforementioned range listed, please consider follow up with your Primary Care Provider.  If you are age 32 or younger, your body mass index should be between 19-25. Your Body mass index is 26.52 kg/m. If this is out of the aformentioned range listed, please consider follow up with your Primary Care Provider.   ________________________________________________________  The [facility] providers would like to encourage you to use [facility] to communicate with providers for non-urgent requests or questions.  Due to long hold times on the telephone, sending your provider a message by [facility] may be a faster and more efficient way to get a response.  Please allow 48 business hours for a response.  Please remember that this is for non-urgent requests.  _______________________________________________________   I appreciate the  opportunity to care for you  Thank You   Harl Bowie , MD

## 2022-07-19 ENCOUNTER — Encounter: Payer: Self-pay | Admitting: Gastroenterology

## 2022-07-21 ENCOUNTER — Encounter (HOSPITAL_BASED_OUTPATIENT_CLINIC_OR_DEPARTMENT_OTHER): Payer: Self-pay | Admitting: Physical Therapy

## 2022-07-21 ENCOUNTER — Ambulatory Visit (HOSPITAL_BASED_OUTPATIENT_CLINIC_OR_DEPARTMENT_OTHER): Payer: 59 | Attending: Orthopedic Surgery | Admitting: Physical Therapy

## 2022-07-21 DIAGNOSIS — M6281 Muscle weakness (generalized): Secondary | ICD-10-CM | POA: Diagnosis not present

## 2022-07-21 DIAGNOSIS — R2681 Unsteadiness on feet: Secondary | ICD-10-CM | POA: Diagnosis not present

## 2022-07-21 DIAGNOSIS — R2689 Other abnormalities of gait and mobility: Secondary | ICD-10-CM

## 2022-07-21 DIAGNOSIS — R262 Difficulty in walking, not elsewhere classified: Secondary | ICD-10-CM

## 2022-07-21 DIAGNOSIS — R29898 Other symptoms and signs involving the musculoskeletal system: Secondary | ICD-10-CM | POA: Insufficient documentation

## 2022-07-21 NOTE — Therapy (Addendum)
OUTPATIENT PHYSICAL THERAPY LOWER EXTREMITY EVALUATION   Patient Name: Christine Brady MRN: [id number] DOB:22-Jan-1961, 61 y.o., female Today's Date: 07/21/2022   PT End of Session - 07/21/22 0855     Visit Number 1    Number of Visits 17    Date for PT Re-Evaluation 09/15/22    Authorization Time Period 07/21/22 to 09/15/22    PT Start Time 0800    PT Stop Time 0845    PT Time Calculation (min) 45 min    Activity Tolerance Patient tolerated treatment well    Behavior During Therapy [facility] for tasks assessed/performed             Past Medical History:  Diagnosis Date   Abdominal pain    Arthritis    difficulty walking   Atypical chest pain 11/11/2016   Bowel obstruction ([place])    Cholesteatoma of right ear    Chronic headaches    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[place]) 11/11/2016   Cyst    back   Hearing aid worn    History of ITP    Hyperlipidemia 11/11/2016   Large ovary 07/09/2019   PONV (postoperative nausea and vomiting)    Reflux    Past Surgical History:  Procedure Laterality Date   CHOLECYSTECTOMY  2010   ESOPHAGOGASTRODUODENOSCOPY (EGD) WITH PROPOFOL N/A 06/12/2019   Procedure: ESOPHAGOGASTRODUODENOSCOPY (EGD) WITH PROPOFOL;  Surgeon: Carol Ada, MD;  [facility];  Service: Endoscopy;  Laterality: N/A;   EXTERNAL EAR SURGERY  2009/2010   EXTERNAL EAR SURGERY  2010   right ear   IR FLUORO GUIDE CV LINE RIGHT  02/10/2022   IR US GUIDE VASC ACCESS RIGHT  02/10/2022   KNEE ARTHROSCOPY     right   KNEE ARTHROSCOPY WITH MEDIAL PATELLAR FEMORAL LIGAMENT RECONSTRUCTION Right 10/05/2021   Procedure: right knee arthroscoy, debridement, lateral menisectomy, medial patellar femoral ligament reconstruction;  Surgeon: Meredith Pel, MD;  Location: [place];  Service: Orthopedics;  Laterality: Right;   KNEE LIGAMENT RECONSTRUCTION     left   MASTOIDECTOMY Right 01/19/2017   Procedure: RIGHT MODIFIED RADICAL MASTOIDECTOMY;  Surgeon:  Vicie Mutters, MD;  Location: [place];  Service: ENT;  Laterality: Right;   UPPER ESOPHAGEAL ENDOSCOPIC ULTRASOUND (EUS) N/A 06/12/2019   Procedure: UPPER ESOPHAGEAL ENDOSCOPIC ULTRASOUND (EUS);  Surgeon: Carol Ada, MD;  Location: [facility];  Service: Endoscopy;  Laterality: N/A;   WISDOM TOOTH EXTRACTION     Patient Active Problem List   Diagnosis Date Noted   UTI (urinary tract infection) 02/19/2022   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   DVT (deep venous thrombosis) ([place]) 02/09/2022   Deep venous thrombosis of lower extremity ([place]) 12/14/2021   Patellar instability of right knee    Complex tear of lateral meniscus of right knee    Loose body in knee, right knee    Constipation 06/05/2021   Diverticular disease of colon 06/05/2021   Family history of other diseases of the digestive system 06/05/2021   Nausea 06/05/2021   Otalgia, left 06/05/2021   Right upper quadrant pain 06/05/2021   Acute lateral meniscus tear of right knee 06/05/2021   Patellar dislocation, right, sequela 06/05/2021   Diplacusis of left ear 10/02/2020   History of right mastoidectomy 04/10/2020   Postop check 02/29/2020   Bowel obstruction ([place]) 09/28/2019   Idiopathic thrombocytopenia ([place]) 09/28/2019   Eustachian tube dysfunction, bilateral 09/28/2019   Tinnitus aurium, bilateral 09/28/2019   Tympanic membrane central perforation,  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   Unspecified cholesteatoma, left ear 09/28/2019   IBS (irritable bowel syndrome) 07/09/2019   Headache 07/09/2019   GERD (gastroesophageal reflux disease) 07/09/2019   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 07/03/2019 07/03/2019   Cecal volvulus ([place]) 07/03/2019   Intestinal volvulus ([place]) 07/03/2019   Menopausal syndrome 03/13/2018   History of postoperative nausea 01/19/2017   Atypical chest pain 11/11/2016   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[place]) 11/11/2016   Epidermoid cyst on  back 10/28/2011    PCP: Kennith Maes   REFERRING PROVIDER: Meredith Pel, MD   REFERRING DIAG: [phone] (ICD-10-CM) - Weakness of both lower extremities   THERAPY DIAG:  Unsteadiness on feet - Plan: PT plan of care cert/re-cert  Muscle weakness (generalized) - Plan: PT plan of care cert/re-cert  Other abnormalities of gait and mobility - Plan: PT plan of care cert/re-cert  Difficulty in walking, not elsewhere classified - Plan: PT plan of care cert/re-cert  Rationale for Evaluation and Treatment Rehabilitation  ONSET DATE: 06/07/2022   SUBJECTIVE:   SUBJECTIVE STATEMENT:  I had knee surgery and GBS as well as a blood clot in my L LE (found December 2022, now cleared by MD); strength is my biggest concern and hardest for me. I don't have the strength to do steps normally, trying to use them more often but I still have to use the railing No residual symptoms that I'm aware of from GBS. I'm tired of limping, I have things to do and it makes it hard to get around work and do things outside. Have a hx of bursitis in L hip, had a shot before knee surgery and it was gone, but now its back.   PERTINENT HISTORY: HPI: Christine Brady is a 61 year old patient underwent right knee arthroscopy with MPFL reconstruction 10/01/2021.  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  That did slow down her rehab.  Not really strong enough yet to do stairs but overall she is making progress with range of motion.  She has been doing a lot of balance rehabilitation but not strengthening rehabilitation.  Still is on a blood thinner until the end of July.                                                                     ROS: All systems reviewed are negative as they relate to the chief complaint within the history of present illness.  Patient denies  fevers or chills.     Assessment & Plan: Visit Diagnoses:  1. Weakness of both lower extremities       Plan: Impression is making good  progress with range of motion but still needs some strengthening.  She has about 2 cm of atrophy right versus left in the quad.  Plan is physical therapy and drawl bridge bilateral lower extremities for quad and hamstring strengthening 3 times a week for 6 weeks.  57-monthreturn for final check.   Follow-Up Instructions: Return in about 3 months (around 09/07/2022).   PAIN:  Are you having pain? No  PRECAUTIONS: None  WEIGHT BEARING RESTRICTIONS No  FALLS:  Has patient fallen in last 6 months? 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, improve function and be able to work outside easier, be more active    OBJECTIVE:   DIAGNOSTIC FINDINGS:   PATIENT SURVEYS:  FOTO 46.8  COGNITION:  Overall cognitive status: Within functional limits for tasks assessed     SENSATION: Not tested  EDEMA:    MUSCLE LENGTH:  POSTURE:   PALPATION:   LOWER EXTREMITY ROM:  Active ROM Right eval Left eval  Hip flexion    Hip extension    Hip abduction    Hip adduction    Hip internal rotation    Hip external rotation    Knee flexion    Knee extension    Ankle dorsiflexion    Ankle plantarflexion    Ankle inversion    Ankle eversion     (Blank rows = not tested)  LOWER EXTREMITY MMT:  MMT Right eval Left eval  Hip flexion 4+ 4+  Hip extension 3+ 3  Hip abduction 4 4+  Hip adduction    Hip internal rotation    Hip external rotation    Knee flexion 4+ 4+  Knee extension 4+ 4+  Ankle dorsiflexion 5 4+  Ankle plantarflexion    Ankle inversion    Ankle eversion     (Blank rows = not tested)  LOWER EXTREMITY SPECIAL TESTS:    FUNCTIONAL TESTS:  Dynamic Gait Index: 12/24                       3MWT 616f  GAIT: Distance walked: 6221f Assistive device  utilized: None Level of assistance: Complete Independence Comments: limping gait pattern, sounds to have some increased foot drop as well, flexed at hips     TODAY'S TREATMENT: Nustep L3 x6 minutes              Bridge x10 2 second holds            Sidelying clams x5 B red TB   PATIENT EDUCATION:  Education details: exam findings, POC, HEP  Person educated: Patient Education method: ExCustomer service managerducation comprehension: verbalized understanding, returned demonstration, and needs further education   HOME EXERCISE PROGRAM: [id number]:  CLINICAL IMPRESSION: Patient is a [age].o. female who was seen today for physical therapy evaluation and treatment for general LE. Doing well overall, just finished intense neuro PT for her balance and now really wants to focus on building strength. Exam does reveal functional muscle weakness, ongoing balance impairment, and significant gait impairment with limping, also easily fatigued. Will benefit from skilled PT services to address limitations and assist in return to optimal level of function moving forward.    OBJECTIVE IMPAIRMENTS Abnormal gait, decreased balance, decreased endurance, decreased mobility, difficulty walking, decreased strength, and impaired flexibility.   ACTIVITY LIMITATIONS lifting, bending, standing, squatting, stairs, transfers, and locomotion level  PARTICIPATION LIMITATIONS: shopping, community activity, and occupation  PESam RayburnPast/current experiences, and Time since onset of injury/illness/exacerbation are also affecting patient's functional outcome.   REHAB POTENTIAL: Good  CLINICAL DECISION MAKING: Stable/uncomplicated  EVALUATION COMPLEXITY: Low   GOALS: Goals reviewed with patient? Yes  SHORT TERM GOALS: Target date: 08/18/2022  Will be compliant with appropriate progressive HEP  Baseline: Goal status: INITIAL  2.  Will demonstrate improved gait pattern with 50%  improvement in limping  Baseline:  Goal status: INITIAL  3.  Will be able to  tolerate 15 minutes on the TM at self selected pace without increase in pain  Baseline:  Goal status: INITIAL  4.  Will be able to ascend/descend at least 6 steps without rail, reciprocal pattern, and no more than Min guard assist  Baseline:  Goal status: INITIAL   LONG TERM GOALS: Target date: 09/15/2022   MMT to be 5/5 globally  Baseline:  Goal status: INITIAL  2.  Gait pattern to show elimination of limp and she will be able to ambulate on the TM for 30 minutes at self selected pace without pain  Baseline:  Goal status: INITIAL  3.  Will be able to climb full flight of steps without rail and reciprocal pattern with no unsteadiness on a Mod(I) Baseline:  Goal status: INITIAL  4.  Will be able to work outside in her garden for unlimited periods of time without pain or fatigue  Baseline:  Goal status: INITIAL    PLAN: PT FREQUENCY: 2x/week  PT DURATION: 8 weeks  PLANNED INTERVENTIONS: Therapeutic exercises, Therapeutic activity, Neuromuscular re-education, Balance training, Gait training, Patient/Family education, Self Care, Joint mobilization, Stair training, Orthotic/Fit training, Aquatic Therapy, Dry Needling, Electrical stimulation, Spinal mobilization, Cryotherapy, Moist heat, Taping, Ultrasound, Ionotophoresis '4mg'$ /ml Dexamethasone, Manual therapy, and Re-evaluation  PLAN FOR NEXT SESSION: check for leg length difference- would she benefit from heel lift? , primary focus on strength but sprinkle a little balance too, check form on treadmill   Mehak Roskelley U PT DPT PN2  07/21/2022, 9:03 AM

## 2022-07-26 ENCOUNTER — Encounter (HOSPITAL_BASED_OUTPATIENT_CLINIC_OR_DEPARTMENT_OTHER): Payer: Self-pay | Admitting: Physical Therapy

## 2022-07-26 ENCOUNTER — Ambulatory Visit (HOSPITAL_BASED_OUTPATIENT_CLINIC_OR_DEPARTMENT_OTHER): Payer: 59 | Admitting: Physical Therapy

## 2022-07-26 DIAGNOSIS — R262 Difficulty in walking, not elsewhere classified: Secondary | ICD-10-CM

## 2022-07-26 DIAGNOSIS — R2689 Other abnormalities of gait and mobility: Secondary | ICD-10-CM | POA: Diagnosis not present

## 2022-07-26 DIAGNOSIS — R2681 Unsteadiness on feet: Secondary | ICD-10-CM

## 2022-07-26 DIAGNOSIS — M6281 Muscle weakness (generalized): Secondary | ICD-10-CM | POA: Diagnosis not present

## 2022-07-26 DIAGNOSIS — R29898 Other symptoms and signs involving the musculoskeletal system: Secondary | ICD-10-CM | POA: Diagnosis not present

## 2022-07-26 NOTE — Therapy (Signed)
OUTPATIENT PHYSICAL THERAPY LOWER EXTREMITY TREATMENT   Patient Name: ODESSA NISHI MRN: [id number] DOB:[birth year], 61 y.o., female Today's Date: 07/26/2022   PT End of Session - 07/26/22 0953     Visit Number 2    Number of Visits 17    Date for PT Re-Evaluation 09/15/22    Authorization Time Period 07/21/22 to 09/15/22    PT Start Time 0945   arrived late   PT Stop Time 1013    PT Time Calculation (min) 28 min    Activity Tolerance Patient tolerated treatment well    Behavior During Therapy [facility] for tasks assessed/performed              Past Medical History:  Diagnosis Date   Abdominal pain    Arthritis    difficulty walking   Atypical chest pain 11/11/2016   Bowel obstruction ([place])    Cholesteatoma of right ear    Chronic headaches    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[place]) 11/11/2016   Cyst    back   Hearing aid worn    History of ITP    Hyperlipidemia 11/11/2016   Large ovary 07/09/2019   PONV (postoperative nausea and vomiting)    Reflux    Past Surgical History:  Procedure Laterality Date   CHOLECYSTECTOMY  2010   ESOPHAGOGASTRODUODENOSCOPY (EGD) WITH PROPOFOL N/A 06/12/2019   Procedure: ESOPHAGOGASTRODUODENOSCOPY (EGD) WITH PROPOFOL;  Surgeon: Carol Ada, MD;  [facility];  Service: Endoscopy;  Laterality: N/A;   EXTERNAL EAR SURGERY  2009/2010   EXTERNAL EAR SURGERY  2010   right ear   IR FLUORO GUIDE CV LINE RIGHT  02/10/2022   IR US GUIDE VASC ACCESS RIGHT  02/10/2022   KNEE ARTHROSCOPY     right   KNEE ARTHROSCOPY WITH MEDIAL PATELLAR FEMORAL LIGAMENT RECONSTRUCTION Right 10/05/2021   Procedure: right knee arthroscoy, debridement, lateral menisectomy, medial patellar femoral ligament reconstruction;  Surgeon: Meredith Pel, MD;  Location: [place];  Service: Orthopedics;  Laterality: Right;   KNEE LIGAMENT RECONSTRUCTION     left   MASTOIDECTOMY Right 01/19/2017   Procedure: RIGHT MODIFIED RADICAL  MASTOIDECTOMY;  Surgeon: Vicie Mutters, MD;  Location: [place];  Service: ENT;  Laterality: Right;   UPPER ESOPHAGEAL ENDOSCOPIC ULTRASOUND (EUS) N/A 06/12/2019   Procedure: UPPER ESOPHAGEAL ENDOSCOPIC ULTRASOUND (EUS);  Surgeon: Carol Ada, MD;  [facility];  Service: Endoscopy;  Laterality: N/A;   WISDOM TOOTH EXTRACTION     Patient Active Problem List   Diagnosis Date Noted   UTI (urinary tract infection) 02/19/2022   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) 12/14/2021   Patellar instability of right knee    Complex tear of lateral meniscus of right knee    Loose body in knee, right knee    Constipation 06/05/2021   Diverticular disease of colon 06/05/2021   Family history of other diseases of the digestive system 06/05/2021   Nausea 06/05/2021   Otalgia, left 06/05/2021   Right upper quadrant pain 06/05/2021   Acute lateral meniscus tear of right knee 06/05/2021   Patellar dislocation, right, sequela 06/05/2021   Diplacusis of left ear 10/02/2020   History of right mastoidectomy 04/10/2020   Postop check 02/29/2020   Bowel obstruction ([place]) 09/28/2019   Idiopathic thrombocytopenia ([place]) 09/28/2019   Eustachian tube dysfunction, bilateral 09/28/2019   Tinnitus aurium, bilateral 09/28/2019  Tympanic membrane central perforation, 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   Unspecified cholesteatoma, left ear 09/28/2019   IBS (irritable bowel syndrome) 07/09/2019   Headache 07/09/2019   GERD (gastroesophageal reflux disease) 07/09/2019   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 07/03/2019 07/03/2019   Cecal volvulus ([place]) 07/03/2019   Intestinal volvulus ([place]) 07/03/2019   Menopausal syndrome 03/13/2018   History of postoperative nausea 01/19/2017   Atypical chest pain 11/11/2016   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[place]) 11/11/2016    Epidermoid cyst on back 10/28/2011    PCP: Kennith Maes   REFERRING PROVIDER: Meredith Pel, MD   REFERRING DIAG: [id number] (ICD-10-CM) - Weakness of both lower extremities   THERAPY DIAG:  Unsteadiness on feet  Muscle weakness (generalized)  Other abnormalities of gait and mobility  Difficulty in walking, not elsewhere classified  Rationale for Evaluation and Treatment Rehabilitation  ONSET DATE: 06/07/2022   SUBJECTIVE:   SUBJECTIVE STATEMENT:  I'm doing good, figured out what was giving me problems on the TM, stretched my calfs out a bit more now I've been able to handle incline for half an hour without any issues   PERTINENT HISTORY: HPI: Amelya is a 61 year old patient underwent right knee arthroscopy with MPFL reconstruction 10/01/2021.  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  That did slow down her rehab.  Not really strong enough yet to do stairs but overall she is making progress with range of motion.  She has been doing a lot of balance rehabilitation but not strengthening rehabilitation.  Still is on a blood thinner until the end of July.                                                                     ROS: All systems reviewed are negative as they relate to the chief complaint within the history of present illness.  Patient denies  fevers or chills.     Assessment & Plan: Visit Diagnoses:  1. Weakness of both lower extremities       Plan: Impression is making good progress with range of motion but still needs some strengthening.  She has about 2 cm of atrophy right versus left in the quad.  Plan is physical therapy and drawl bridge bilateral lower extremities for quad and hamstring strengthening 3 times a week for 6 weeks.  67-monthreturn for final check.   Follow-Up Instructions: Return in about 3 months (around 09/07/2022).   PAIN:  Are you having pain? No  PRECAUTIONS: None  WEIGHT BEARING RESTRICTIONS No  FALLS:  Has  patient fallen in last 6 months? 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[employer], improve function and be able to work outside easier, be more active   TREATMENT TODAY  8/14  Scifit bike L5 x6 minutes   Noted LLD with L LE slightly longer (about 1/4 inch); corrected easily with SI MET  Forward and lateral step ups 6 inch box 1x10 B each  Forward lunges onto 6 inch box 1x10 B Wall sits 1x5 cues to not offload weak leg     OBJECTIVE:  DIAGNOSTIC FINDINGS:   PATIENT SURVEYS:  FOTO 46.8  COGNITION:  Overall cognitive status: Within functional limits for tasks assessed     SENSATION: Not tested  EDEMA:    MUSCLE LENGTH:  POSTURE:   PALPATION:   LOWER EXTREMITY ROM:  Active ROM Right eval Left eval  Hip flexion    Hip extension    Hip abduction    Hip adduction    Hip internal rotation    Hip external rotation    Knee flexion    Knee extension    Ankle dorsiflexion    Ankle plantarflexion    Ankle inversion    Ankle eversion     (Blank rows = not tested)  LOWER EXTREMITY MMT:  MMT Right eval Left eval  Hip flexion 4+ 4+  Hip extension 3+ 3  Hip abduction 4 4+  Hip adduction    Hip internal rotation    Hip external rotation    Knee flexion 4+ 4+  Knee extension 4+ 4+  Ankle dorsiflexion 5 4+  Ankle plantarflexion    Ankle inversion    Ankle eversion     (Blank rows = not tested)  LOWER EXTREMITY SPECIAL TESTS:    FUNCTIONAL TESTS:  Dynamic Gait Index: 12/24                       3MWT 684f  GAIT: Distance walked: 6230f Assistive device utilized: None Level of assistance: Complete Independence Comments: limping gait pattern, sounds to have some increased foot drop as well, flexed at hips     TODAY'S TREATMENT: Nustep L3 x6  minutes              Bridge x10 2 second holds            Sidelying clams x5 B red TB   PATIENT EDUCATION:  Education details: exam findings, POC, HEP  Person educated: Patient Education method: ExCustomer service managerducation comprehension: verbalized understanding, returned demonstration, and needs further education   HOME EXERCISE PROGRAM: [id number]:  CLINICAL IMPRESSION:  Nery arrives today doing OK, arrived a little late this morning. We focused on functional strengthening today, also checked for LLD today to rule out possible impact of this on gait deviations- did find slight LLD which was easily corrected with MET. Will continue to progress as able and tolerated.     OBJECTIVE IMPAIRMENTS Abnormal gait, decreased balance, decreased endurance, decreased mobility, difficulty walking, decreased strength, and impaired flexibility.   ACTIVITY LIMITATIONS lifting, bending, standing, squatting, stairs, transfers, and locomotion level  PARTICIPATION LIMITATIONS: shopping, community activity, and occupation  [place]/current experiences, and Time since onset of injury/illness/exacerbation are also affecting patient's functional outcome.   REHAB POTENTIAL: Good  CLINICAL DECISION MAKING: Stable/uncomplicated  EVALUATION COMPLEXITY: Low   GOALS: Goals reviewed with patient? Yes  SHORT TERM GOALS: Target date: 08/18/2022  Will be compliant with appropriate progressive HEP  Baseline: Goal status: INITIAL  2.  Will demonstrate improved gait pattern with 50% improvement in limping  Baseline:  Goal status: INITIAL  3.  Will be able to tolerate 15 minutes on the TM at self selected pace without increase in pain  Baseline:  Goal status: INITIAL  4.  Will be able to ascend/descend at least 6 steps without rail, reciprocal pattern, and no more than Min guard assist  Baseline:  Goal status: INITIAL   LONG TERM GOALS: Target date:  09/15/2022   MMT to be 5/5  globally  Baseline:  Goal status: INITIAL  2.  Gait pattern to show elimination of limp and she will be able to ambulate on the TM for 30 minutes at self selected pace without pain  Baseline:  Goal status: INITIAL  3.  Will be able to climb full flight of steps without rail and reciprocal pattern with no unsteadiness on a Mod(I) Baseline:  Goal status: INITIAL  4.  Will be able to work outside in her garden for unlimited periods of time without pain or fatigue  Baseline:  Goal status: INITIAL    PLAN: PT FREQUENCY: 2x/week  PT DURATION: 8 weeks  PLANNED INTERVENTIONS: Therapeutic exercises, Therapeutic activity, Neuromuscular re-education, Balance training, Gait training, Patient/Family education, Self Care, Joint mobilization, Stair training, Orthotic/Fit training, Aquatic Therapy, Dry Needling, Electrical stimulation, Spinal mobilization, Cryotherapy, Moist heat, Taping, Ultrasound, Ionotophoresis 72m/ml Dexamethasone, Manual therapy, and Re-evaluation  PLAN FOR NEXT SESSION: SI MET PRN.  Primary focus on strength but sprinkle a little balance too. Stay close to her can lose balance fast    Lacinda Curvin U PT DPT PN2  07/26/2022, 10:14 AM

## 2022-08-04 ENCOUNTER — Telehealth: Payer: Self-pay | Admitting: Neurology

## 2022-08-04 NOTE — Telephone Encounter (Signed)
I called pt to get more clarification on the reason for the PT denial.  She stated that she has letter from insurance stating that they are denying PT from 05-13-2022 on from Neuro rehab due to stating that it is medically unnecessary?    I relayed to pt that not sure who this needs to go to, she said after speaking to insurance it would go to [place].  (Needing information).  Not sure??  Go to PT (separate dept for treatment- they authorize there treatment?).  In addition needs letter stating that it is medically necessary due to guillian barre syndrome.

## 2022-08-04 NOTE — Telephone Encounter (Signed)
Patient requesting letter of necessity for PT as her insurance denied. Requesting letter to decline flu shot due to Guillain-Barre  Best call back is [phone]

## 2022-08-05 ENCOUNTER — Encounter: Payer: Self-pay | Admitting: *Deleted

## 2022-08-05 NOTE — Telephone Encounter (Signed)
Spoke to pt and she asked to scan letter to email at [employer].  [email or web address].   DONE.  Relayed to see what Neuro rehab is finding out about the therapies. Will follow up as needed.

## 2022-08-11 ENCOUNTER — Encounter (HOSPITAL_BASED_OUTPATIENT_CLINIC_OR_DEPARTMENT_OTHER): Payer: Self-pay

## 2022-08-11 ENCOUNTER — Ambulatory Visit (HOSPITAL_BASED_OUTPATIENT_CLINIC_OR_DEPARTMENT_OTHER): Payer: 59 | Admitting: Physical Therapy

## 2022-08-11 ENCOUNTER — Encounter (HOSPITAL_BASED_OUTPATIENT_CLINIC_OR_DEPARTMENT_OTHER): Payer: Self-pay | Admitting: Physical Therapy

## 2022-08-11 DIAGNOSIS — R2681 Unsteadiness on feet: Secondary | ICD-10-CM | POA: Diagnosis not present

## 2022-08-11 DIAGNOSIS — R262 Difficulty in walking, not elsewhere classified: Secondary | ICD-10-CM

## 2022-08-11 DIAGNOSIS — R29898 Other symptoms and signs involving the musculoskeletal system: Secondary | ICD-10-CM | POA: Diagnosis not present

## 2022-08-11 DIAGNOSIS — R2689 Other abnormalities of gait and mobility: Secondary | ICD-10-CM

## 2022-08-11 DIAGNOSIS — M6281 Muscle weakness (generalized): Secondary | ICD-10-CM | POA: Diagnosis not present

## 2022-08-11 NOTE — Therapy (Addendum)
OUTPATIENT PHYSICAL THERAPY TREATMENT NOTE   Patient Name: Christine Brady MRN: [id number] DOB:11-13-1961, 61 y.o., female Today's Date: 08/12/2022   PT End of Session - 08/11/22 1542     Visit Number 3    Number of Visits 17    Date for PT Re-Evaluation 09/15/22    Authorization Time Period 07/21/22 to 09/15/22    PT Start Time 1518    PT Stop Time 1557    PT Time Calculation (min) 39 min    Activity Tolerance Patient tolerated treatment well    Behavior During Therapy [facility] for tasks assessed/performed               Past Medical History:  Diagnosis Date   Abdominal pain    Arthritis    difficulty walking   Atypical chest pain 11/11/2016   Bowel obstruction ([place])    Cholesteatoma of right ear    Chronic headaches    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[place]) 11/11/2016   Cyst    back   Hearing aid worn    History of ITP    Hyperlipidemia 11/11/2016   Large ovary 07/09/2019   PONV (postoperative nausea and vomiting)    Reflux    Past Surgical History:  Procedure Laterality Date   CHOLECYSTECTOMY  2010   ESOPHAGOGASTRODUODENOSCOPY (EGD) WITH PROPOFOL N/A 06/12/2019   Procedure: ESOPHAGOGASTRODUODENOSCOPY (EGD) WITH PROPOFOL;  Surgeon: Carol Ada, MD;  [facility];  Service: Endoscopy;  Laterality: N/A;   EXTERNAL EAR SURGERY  2009/2010   EXTERNAL EAR SURGERY  2010   right ear   IR FLUORO GUIDE CV LINE RIGHT  02/10/2022   IR US GUIDE VASC ACCESS RIGHT  02/10/2022   KNEE ARTHROSCOPY     right   KNEE ARTHROSCOPY WITH MEDIAL PATELLAR FEMORAL LIGAMENT RECONSTRUCTION Right 10/05/2021   Procedure: right knee arthroscoy, debridement, lateral menisectomy, medial patellar femoral ligament reconstruction;  Surgeon: Meredith Pel, MD;  Location: [place];  Service: Orthopedics;  Laterality: Right;   KNEE LIGAMENT RECONSTRUCTION     left   MASTOIDECTOMY Right 01/19/2017   Procedure: RIGHT MODIFIED RADICAL MASTOIDECTOMY;  Surgeon: Vicie Mutters, MD;  Location: [place];  Service: ENT;  Laterality: Right;   UPPER ESOPHAGEAL ENDOSCOPIC ULTRASOUND (EUS) N/A 06/12/2019   Procedure: UPPER ESOPHAGEAL ENDOSCOPIC ULTRASOUND (EUS);  Surgeon: Carol Ada, MD;  [facility];  Service: Endoscopy;  Laterality: N/A;   WISDOM TOOTH EXTRACTION     Patient Active Problem List   Diagnosis Date Noted   UTI (urinary tract infection) 02/19/2022   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[place]) 02/09/2022   DVT (deep venous thrombosis) ([place]) 02/09/2022   Deep venous thrombosis of lower extremity ([place]) 12/14/2021   Patellar instability of right knee    Complex tear of lateral meniscus of right knee    Loose body in knee, right knee    Constipation 06/05/2021   Diverticular disease of colon 06/05/2021   Family history of other diseases of the digestive system 06/05/2021   Nausea 06/05/2021   Otalgia, left 06/05/2021   Right upper quadrant pain 06/05/2021   Acute lateral meniscus tear of right knee 06/05/2021   Patellar dislocation, right, sequela 06/05/2021   Diplacusis of left ear 10/02/2020   History of right mastoidectomy 04/10/2020   Postop check 02/29/2020   Bowel obstruction ([place]) 09/28/2019   Idiopathic thrombocytopenia ([place]) 09/28/2019   Eustachian tube dysfunction, bilateral 09/28/2019   Tinnitus aurium, bilateral 09/28/2019   Tympanic membrane central  perforation, 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   Unspecified cholesteatoma, left ear 09/28/2019   IBS (irritable bowel syndrome) 07/09/2019   Headache 07/09/2019   GERD (gastroesophageal reflux disease) 07/09/2019   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 07/03/2019 07/03/2019   Cecal volvulus ([place]) 07/03/2019   Intestinal volvulus ([place]) 07/03/2019   Menopausal syndrome 03/13/2018   History of postoperative nausea 01/19/2017   Atypical chest pain 11/11/2016   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[place]) 11/11/2016   Epidermoid cyst on back  10/28/2011    PCP: Kennith Maes   REFERRING PROVIDER: Meredith Pel, MD   REFERRING DIAG: [phone] (ICD-10-CM) - Weakness of both lower extremities   THERAPY DIAG:  Unsteadiness on feet  Muscle weakness (generalized)  Other abnormalities of gait and mobility  Difficulty in walking, not elsewhere classified  Rationale for Evaluation and Treatment Rehabilitation  ONSET DATE: 06/07/2022   SUBJECTIVE:   SUBJECTIVE STATEMENT: Pt states she felt fine after prior Rx.  Pt reports compliance with HEP.  She is trying to walk up steps as much as possible, which she couldn't do prior.  "I'm doing better".  Pt reports having R knee pain yesterday and the day before due to walking on TM on an incline for 30 mins.  Pt states she took Ibuprofen last night, but hasn't been taking any pain meds.  Pt states she feels cramping in bilat hip adductors with activity including walking.  Pt states she has not had any falls though does stub the front of her toe causing her to lose her balance which scares her.   PERTINENT HISTORY: -R knee surgery x 2 (arthroscopic) and L knee surgery x 1 (reconstruction)  HPI: Luretha is a 61 year old patient underwent right knee arthroscopy with MPFL reconstruction 10/01/2021.  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  That did slow down her rehab.  Not really strong enough yet to do stairs but overall she is making progress with range of motion.  She has been doing a lot of balance rehabilitation but not strengthening rehabilitation.  Still is on a blood thinner until the end of July.                                                                     ROS: All systems reviewed are negative as they relate to the chief complaint within the history of present illness.  Patient denies  fevers or chills.     Assessment & Plan: Visit Diagnoses:  1. Weakness of both lower extremities       Plan: Impression is making good progress with range of  motion but still needs some strengthening.  She has about 2 cm of atrophy right versus left in the quad.  Plan is physical therapy and drawl bridge bilateral lower extremities for quad and hamstring strengthening 3 times a week for 6 weeks.  81-monthreturn for final check.   Follow-Up Instructions: Return in about 3 months (around 09/07/2022).   PAIN:  Are you having pain? No  PRECAUTIONS: None  WEIGHT BEARING RESTRICTIONS No  FALLS:  Has patient fallen in last 6 months? 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 Pt performed:   Scifit bike L5 x 5 minutes    Bridge 2 x10 with 2 second holds   Supine SLR x5 reps on R and x 10 reps on L   Tandem stance 2x30 sec bilat without UE support with CGA   Step up on airex x10 reps bilat   Wall sits with 5 sec hold x 5 reps   Therapeutic Activities:              Sidestepping at rail without UE support x 2 laps          Step ups x10 on L on 6 inch step; R LE x 5 reps on 6 inch and x 10 reps on 4 inch step   Lateral step ups  R:  10 reps on 4 inch, L:  10 reps on 6 inch               PATIENT EDUCATION:  Education details: exam findings, POC, HEP  Person educated: Patient Education method: Customer service manager Education comprehension: verbalized understanding, returned demonstration, and needs further education   HOME EXERCISE PROGRAM: [id number]  ASSESSMENT:  CLINICAL IMPRESSION: Pt reports she is improving.  Pt performed exercises well with cuing and instruction for correct form.   She reports having L adductor cramping with bridging and has been having adductor cramping with activity.  Pt has weakness in R > L LE as evidenced by performance of exercises including supine SLR.  Pt was given rest breaks t/o Rx as needed and pt is highly motivated giving good effort with all exercises.  Pt responded well to Rx having no c/o's after Rx and was fatigued.  She should benefit form skilled PT services to address goals and impairments and to improve overall function.    OBJECTIVE IMPAIRMENTS Abnormal gait, decreased balance, decreased endurance, decreased mobility, difficulty walking, decreased strength, and impaired flexibility.   ACTIVITY LIMITATIONS lifting, bending, standing, squatting, stairs, transfers, and locomotion level  PARTICIPATION LIMITATIONS: shopping, community activity, and occupation  [place], Past/current experiences, and Time since onset of injury/illness/exacerbation are also affecting patient's functional outcome.   REHAB POTENTIAL: Good  CLINICAL DECISION MAKING: Stable/uncomplicated  EVALUATION COMPLEXITY: Low   GOALS: Goals reviewed with patient? Yes  SHORT TERM GOALS: Target date: 08/18/2022  Will be compliant with appropriate progressive HEP  Baseline: Goal status: INITIAL  2.  Will demonstrate improved gait pattern with 50% improvement in limping  Baseline:  Goal status: INITIAL  3.  Will be able to tolerate 15 minutes on the TM at self selected pace without increase in pain  Baseline:  Goal status: INITIAL  4.  Will be able to ascend/descend at least 6 steps without rail, reciprocal pattern, and no more than Min guard assist  Baseline:  Goal status: INITIAL   LONG TERM GOALS: Target date: 09/15/2022   MMT to be 5/5 globally  Baseline:  Goal status: INITIAL  2.  Gait pattern to show elimination of limp and she will be able to ambulate on the TM for 30 minutes at self selected pace without pain  Baseline:  Goal  status: INITIAL  3.  Will be able to climb full flight  of steps without rail and reciprocal pattern with no unsteadiness on a Mod(I) Baseline:  Goal status: INITIAL  4.  Will be able to work outside in her garden for unlimited periods of time without pain or fatigue  Baseline:  Goal status: INITIAL    PLAN: PT FREQUENCY: 2x/week  PT DURATION: 8 weeks  PLANNED INTERVENTIONS: Therapeutic exercises, Therapeutic activity, Neuromuscular re-education, Balance training, Gait training, Patient/Family education, Self Care, Joint mobilization, Stair training, Orthotic/Fit training, Aquatic Therapy, Dry Needling, Electrical stimulation, Spinal mobilization, Cryotherapy, Moist heat, Taping, Ultrasound, Ionotophoresis 39m/ml Dexamethasone, Manual therapy, and Re-evaluation  PLAN FOR NEXT SESSION: SI MET PRN.  Cont to focus on strength and work on proprioception. Stay close to her can lose balance fast    RSelinda MichaelsIII PT, DPT 08/12/22 10:49 PM  PHYSICAL THERAPY DISCHARGE SUMMARY  Visits from Start of Care: 3  Current functional level related to goals / functional outcomes: Unable to assess current functional status or goals due to pt not being present at discharge.     Remaining deficits: Unable to assess due to pt not being present at discharge.    Education / Equipment: Pt has a HEP.   Patient was seen in PT for 3 visits.  She called PT and stated she needed to stop PT due to financial reasons.  Pt agrees to discharge.  Patient is being discharged due to financial reasons.  RSelinda MichaelsIII PT, DPT 08/25/22 8:41 AM

## 2022-08-17 ENCOUNTER — Ambulatory Visit (HOSPITAL_BASED_OUTPATIENT_CLINIC_OR_DEPARTMENT_OTHER): Payer: 59 | Admitting: Physical Therapy

## 2022-08-17 ENCOUNTER — Encounter (HOSPITAL_BASED_OUTPATIENT_CLINIC_OR_DEPARTMENT_OTHER): Payer: Self-pay

## 2022-08-19 ENCOUNTER — Other Ambulatory Visit (HOSPITAL_COMMUNITY): Payer: Self-pay

## 2022-08-19 ENCOUNTER — Ambulatory Visit (HOSPITAL_BASED_OUTPATIENT_CLINIC_OR_DEPARTMENT_OTHER): Payer: 59 | Admitting: Physical Therapy

## 2022-08-19 ENCOUNTER — Other Ambulatory Visit: Payer: Self-pay | Admitting: Gastroenterology

## 2022-08-19 MED ORDER — DICYCLOMINE HCL 10 MG PO CAPS
10.0000 mg | ORAL_CAPSULE | Freq: Three times a day (TID) | ORAL | 0 refills | Status: DC | PRN
  Filled 2022-08-19: qty 90, 30d supply, fill #0

## 2022-08-20 ENCOUNTER — Other Ambulatory Visit (HOSPITAL_COMMUNITY): Payer: Self-pay

## 2022-08-20 ENCOUNTER — Other Ambulatory Visit: Payer: Self-pay | Admitting: Gastroenterology

## 2022-08-20 ENCOUNTER — Other Ambulatory Visit: Payer: Self-pay

## 2022-08-23 ENCOUNTER — Other Ambulatory Visit (HOSPITAL_COMMUNITY): Payer: Self-pay

## 2022-08-23 ENCOUNTER — Encounter: Payer: Self-pay | Admitting: Gastroenterology

## 2022-08-24 ENCOUNTER — Encounter (HOSPITAL_BASED_OUTPATIENT_CLINIC_OR_DEPARTMENT_OTHER): Payer: 59 | Admitting: Physical Therapy

## 2022-08-26 ENCOUNTER — Encounter (HOSPITAL_BASED_OUTPATIENT_CLINIC_OR_DEPARTMENT_OTHER): Payer: 59 | Admitting: Physical Therapy

## 2022-08-26 ENCOUNTER — Other Ambulatory Visit (HOSPITAL_COMMUNITY): Payer: Self-pay

## 2022-08-26 ENCOUNTER — Other Ambulatory Visit: Payer: Self-pay

## 2022-08-26 MED ORDER — OMEPRAZOLE 40 MG PO CPDR
40.0000 mg | DELAYED_RELEASE_CAPSULE | Freq: Every day | ORAL | 3 refills | Status: DC
  Filled 2022-08-26: qty 60, 60d supply, fill #0
  Filled 2022-10-21: qty 60, 60d supply, fill #1
  Filled 2023-07-20: qty 60, 60d supply, fill #2

## 2022-08-31 ENCOUNTER — Encounter (HOSPITAL_BASED_OUTPATIENT_CLINIC_OR_DEPARTMENT_OTHER): Payer: 59 | Admitting: Physical Therapy

## 2022-09-01 ENCOUNTER — Other Ambulatory Visit (HOSPITAL_COMMUNITY): Payer: Self-pay

## 2022-09-02 ENCOUNTER — Encounter (HOSPITAL_BASED_OUTPATIENT_CLINIC_OR_DEPARTMENT_OTHER): Payer: 59 | Admitting: Physical Therapy

## 2022-09-07 ENCOUNTER — Encounter (HOSPITAL_BASED_OUTPATIENT_CLINIC_OR_DEPARTMENT_OTHER): Payer: 59 | Admitting: Physical Therapy

## 2022-09-08 ENCOUNTER — Other Ambulatory Visit (HOSPITAL_COMMUNITY): Payer: Self-pay

## 2022-09-08 ENCOUNTER — Ambulatory Visit (INDEPENDENT_AMBULATORY_CARE_PROVIDER_SITE_OTHER): Payer: 59

## 2022-09-08 ENCOUNTER — Encounter: Payer: Self-pay | Admitting: Orthopedic Surgery

## 2022-09-08 ENCOUNTER — Ambulatory Visit: Payer: Self-pay

## 2022-09-08 ENCOUNTER — Ambulatory Visit (INDEPENDENT_AMBULATORY_CARE_PROVIDER_SITE_OTHER): Payer: 59 | Admitting: Surgical

## 2022-09-08 DIAGNOSIS — M25552 Pain in left hip: Secondary | ICD-10-CM

## 2022-09-08 DIAGNOSIS — M7062 Trochanteric bursitis, left hip: Secondary | ICD-10-CM

## 2022-09-08 DIAGNOSIS — M25561 Pain in right knee: Secondary | ICD-10-CM

## 2022-09-08 DIAGNOSIS — G8929 Other chronic pain: Secondary | ICD-10-CM

## 2022-09-08 MED ORDER — LIDOCAINE HCL 1 % IJ SOLN
5.0000 mL | INTRAMUSCULAR | Status: AC | PRN
  Administered 2022-09-08: 5 mL

## 2022-09-08 MED ORDER — METHYLPREDNISOLONE ACETATE 40 MG/ML IJ SUSP
40.0000 mg | INTRAMUSCULAR | Status: AC | PRN
  Administered 2022-09-08: 40 mg via INTRA_ARTICULAR

## 2022-09-08 MED ORDER — MELOXICAM 15 MG PO TABS
15.0000 mg | ORAL_TABLET | Freq: Every day | ORAL | 1 refills | Status: DC | PRN
  Filled 2022-09-08: qty 30, 30d supply, fill #0

## 2022-09-08 MED ORDER — BUPIVACAINE HCL 0.25 % IJ SOLN
4.0000 mL | INTRAMUSCULAR | Status: AC | PRN
  Administered 2022-09-08: 4 mL via INTRA_ARTICULAR

## 2022-09-08 NOTE — Progress Notes (Signed)
Office Visit Note   Patient: Christine Brady           Date of Birth: January 04, 1961           MRN: [id number] Visit Date: 09/08/2022 Requested by: Ronita Hipps, MD [address] [place] [address],  PCP: Ronita Hipps, MD  Subjective: Chief Complaint  Patient presents with   Left Hip - Pain   Right Knee - Pain    HPI: Christine Brady is a 61 y.o. female who presents to the office complaining of right knee pain and left hip pain.  Patient has returned following right knee arthroscopy with MPFL reconstruction on 10/01/2021.  She feels she has had some increased pain in the last 1 to 2 months without injury.  Localizes pain primarily to the anterior aspect of the knee around the tibial tubercle.  States it bothers her with walking and makes it difficult to weight-bear at times.  No instances of patellofemoral instability.  No fevers or chills.  Also complains of left hip pain that she localizes to the lateral aspect of the hip.  She has had previous injection into the trochanteric bursa by Dr. Ninfa Linden about a year ago that gave her great relief.  She denies any significant groin pain or any significant radiation of pain except for a little bit of pain that travels down the lateral thigh to mid thigh.  No numbness or tingling.  She has difficulty laying on her left side due to the pain..                ROS: All systems reviewed are negative as they relate to the chief complaint within the history of present illness.  Patient denies fevers or chills.  Assessment & Plan: Visit Diagnoses:  1. Chronic pain of right knee   2. Pain in left hip     Plan: Patient is a 61 year old female who presents for evaluation of right knee pain and left hip pain.  Right knee is most tender over the tibial tubercle and patellar tendon.  She has radiographs taken today demonstrating no significant changes compared with prior imaging or any concern for acute abnormality.  Ultrasound probe was applied  and demonstrates patellar tendon with intact insertion on the tibial tubercle with no cortical irregularity or evidence of disruption of the tendon.  Impression is patellar tendinitis.  Recommended she try topical anti-inflammatory, quadricep stretching exercises, eccentric quadricep strengthening exercises.  Follow-up if she has continued pain.  Regarding her left hip pain.  She would like to try trochanteric injection today.  No pain with hip range of motion with internal rotation or external rotation passively.  She has good gluteal tendon function on exam today while she is on the bed.  Plan to proceed with trochanteric injection which has given her great relief in the past.  Under ultrasound guidance the anterior, lateral, posterior facets of the greater trochanter were identified.  Injection was successfully delivered into the trochanteric bursa and patient tolerated the procedure well.  There was no resistance to administration of the injection during the procedure indicating tendinous attachment was avoided.  Patient will follow-up with the office as needed.  Follow-Up Instructions: No follow-ups on file.   Orders:  Orders Placed This Encounter  Procedures   XR KNEE 3 VIEW RIGHT   US Guided Needle Placement - No Linked Charges   Meds ordered this encounter  Medications   meloxicam (MOBIC) 15 MG tablet  Sig: Take 1 tablet (15 mg total) by mouth daily as needed for pain.    Dispense:  30 tablet    Refill:  1      Procedures: Large Joint Inj: L greater trochanter on 09/08/2022 8:36 PM Indications: pain and diagnostic evaluation Details: 22 G 3.5 in needle, ultrasound-guided posterior approach  Arthrogram: No  Medications: 5 mL lidocaine 1 %; 4 mL bupivacaine 0.25 %; 40 mg methylPREDNISolone acetate 40 MG/ML Outcome: tolerated well, no immediate complications Procedure, treatment alternatives, risks and benefits explained, specific risks discussed. Consent was given by the patient.  Immediately prior to procedure a time out was called to verify the correct patient, procedure, equipment, support staff and site/side marked as required. Patient was prepped and draped in the usual sterile fashion.       Clinical Data: No additional findings.  Objective: Vital Signs: There were no vitals taken for this visit.  Physical Exam:  Constitutional: Patient appears well-developed HEENT:  Head: Normocephalic Eyes:EOM are normal Neck: Normal range of motion Cardiovascular: Normal rate Pulmonary/chest: Effort normal Neurologic: Patient is alert Skin: Skin is warm Psychiatric: Patient has normal mood and affect  Ortho Exam: Ortho exam demonstrates right knee with no effusion.  Well-healed incisions from prior surgery.  Stable patella to lateral directed forces with mild tenderness over the medial aspect of the patella.  Well-preserved range of motion from 0 degrees extension to greater than 90 degrees of knee flexion quite easily.  She does have point tenderness primarily over the tibial tubercle and distal patellar tendon.  Very minimal tenderness in the medial and lateral joint lines.  Left hip with no pain with internal rotation or external rotation.  Negative FADIR sign.  Negative Stinchfield sign.  She does have point tenderness overlying the greater trochanter and this reproduces the severe pain she is experiencing in the left hip.  She has increased pain and reproduction of pain with resisted hip abduction in the lateral position as well as maintaining her hip in a internally rotated position.  She is able to accomplish both these tasks.  She does not walk with any significant Trendelenburg gait.  She is able to stabilize her pelvis with lifting both legs and stand on 1 foot while bracing herself slightly against the counter.  Specialty Comments:  No specialty comments available.  Imaging: No results found.   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[facility]) 02/09/2022   DVT (deep venous thrombosis) ([facility]) 02/09/2022   Deep venous thrombosis of lower extremity ([facility]) 12/14/2021   Patellar instability of right knee    Complex tear of lateral meniscus of right knee    Loose body in knee, right knee    Constipation 06/05/2021   Diverticular disease of colon 06/05/2021   Family history of other diseases of the digestive system 06/05/2021   Nausea 06/05/2021   Otalgia, left 06/05/2021   Right upper quadrant pain 06/05/2021   Acute lateral meniscus tear of right knee 06/05/2021   Patellar dislocation, right, sequela 06/05/2021   Diplacusis of left ear 10/02/2020   History of right mastoidectomy 04/10/2020   Postop check 02/29/2020   Bowel obstruction ([facility]) 09/28/2019   Idiopathic thrombocytopenia ([facility]) 09/28/2019   Eustachian tube dysfunction, bilateral 09/28/2019   Tinnitus aurium, bilateral 09/28/2019   Tympanic membrane central perforation, 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   Unspecified cholesteatoma, left ear 09/28/2019   IBS (irritable bowel  syndrome) 07/09/2019   Headache 07/09/2019   GERD (gastroesophageal reflux disease) 07/09/2019   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 07/03/2019 07/03/2019   Cecal volvulus ([facility]) 07/03/2019   Intestinal volvulus ([facility]) 07/03/2019   Menopausal syndrome 03/13/2018   History of postoperative nausea 01/19/2017   Atypical chest pain 11/11/2016   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[facility]) 11/11/2016   Epidermoid cyst on back 10/28/2011   Past Medical History:  Diagnosis Date   Abdominal pain    Arthritis    difficulty walking   Atypical chest pain 11/11/2016   Bowel obstruction ([facility])    Cholesteatoma of right ear    Chronic headaches    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[facility]) 11/11/2016   Cyst    back   Hearing aid worn    History of ITP    Hyperlipidemia 11/11/2016   Large  ovary 07/09/2019   PONV (postoperative nausea and vomiting)    Reflux     Family History  Problem Relation Age of Onset   Diabetes Mother    Asthma Mother    Diabetes type II Mother    Alcohol abuse Mother    Hypertension Mother    Diabetes Father    Hypertension Father    Kidney disease Father    Diabetes type II Father    Alcohol abuse Father    Crohn's disease Sister    COPD Sister    Thrombocytopenia Sister    Cerebral palsy Brother    Neuropathy Neg Hx     Past Surgical History:  Procedure Laterality Date   CHOLECYSTECTOMY  2010   ESOPHAGOGASTRODUODENOSCOPY (EGD) WITH PROPOFOL N/A 06/12/2019   Procedure: ESOPHAGOGASTRODUODENOSCOPY (EGD) WITH PROPOFOL;  Surgeon: Carol Ada, MD;  Location: [facility];  Service: Endoscopy;  Laterality: N/A;   EXTERNAL EAR SURGERY  2009/2010   EXTERNAL EAR SURGERY  2010   right ear   IR FLUORO GUIDE CV LINE RIGHT  02/10/2022   IR US GUIDE VASC ACCESS RIGHT  02/10/2022   KNEE ARTHROSCOPY     right   KNEE ARTHROSCOPY WITH MEDIAL PATELLAR FEMORAL LIGAMENT RECONSTRUCTION Right 10/05/2021   Procedure: right knee arthroscoy, debridement, lateral menisectomy, medial patellar femoral ligament reconstruction;  Surgeon: Meredith Pel, MD;  Location: [facility];  Service: Orthopedics;  Laterality: Right;   KNEE LIGAMENT RECONSTRUCTION     left   MASTOIDECTOMY Right 01/19/2017   Procedure: RIGHT MODIFIED RADICAL MASTOIDECTOMY;  Surgeon: Vicie Mutters, MD;  Location: [facility];  Service: ENT;  Laterality: Right;   UPPER ESOPHAGEAL ENDOSCOPIC ULTRASOUND (EUS) N/A 06/12/2019   Procedure: UPPER ESOPHAGEAL ENDOSCOPIC ULTRASOUND (EUS);  Surgeon: Carol Ada, MD;  Location: [facility];  Service: Endoscopy;  Laterality: N/A;   WISDOM TOOTH EXTRACTION     Social History   Occupational History   Not on file  Tobacco Use   Smoking status: Never   Smokeless tobacco: Never  Vaping Use   Vaping Use: Never used  Substance  and Sexual Activity   Alcohol use: Yes    Comment: occas   Drug use: No   Sexual activity: Not on file

## 2022-09-09 ENCOUNTER — Encounter (HOSPITAL_BASED_OUTPATIENT_CLINIC_OR_DEPARTMENT_OTHER): Payer: 59 | Admitting: Physical Therapy

## 2022-09-14 ENCOUNTER — Encounter (HOSPITAL_BASED_OUTPATIENT_CLINIC_OR_DEPARTMENT_OTHER): Payer: 59 | Admitting: Physical Therapy

## 2022-09-14 DIAGNOSIS — D225 Melanocytic nevi of trunk: Secondary | ICD-10-CM | POA: Diagnosis not present

## 2022-09-14 DIAGNOSIS — Z85828 Personal history of other malignant neoplasm of skin: Secondary | ICD-10-CM | POA: Diagnosis not present

## 2022-09-14 DIAGNOSIS — D2272 Melanocytic nevi of left lower limb, including hip: Secondary | ICD-10-CM | POA: Diagnosis not present

## 2022-09-14 DIAGNOSIS — L570 Actinic keratosis: Secondary | ICD-10-CM | POA: Diagnosis not present

## 2022-09-14 DIAGNOSIS — L814 Other melanin hyperpigmentation: Secondary | ICD-10-CM | POA: Diagnosis not present

## 2022-09-14 DIAGNOSIS — L821 Other seborrheic keratosis: Secondary | ICD-10-CM | POA: Diagnosis not present

## 2022-09-14 DIAGNOSIS — L578 Other skin changes due to chronic exposure to nonionizing radiation: Secondary | ICD-10-CM | POA: Diagnosis not present

## 2022-09-16 ENCOUNTER — Encounter (HOSPITAL_BASED_OUTPATIENT_CLINIC_OR_DEPARTMENT_OTHER): Payer: 59 | Admitting: Physical Therapy

## 2022-09-27 ENCOUNTER — Encounter: Payer: Self-pay | Admitting: Oncology

## 2022-09-27 ENCOUNTER — Inpatient Hospital Stay (HOSPITAL_BASED_OUTPATIENT_CLINIC_OR_DEPARTMENT_OTHER): Payer: 59 | Admitting: Oncology

## 2022-09-27 ENCOUNTER — Inpatient Hospital Stay: Payer: 59 | Attending: Oncology

## 2022-09-27 VITALS — BP 128/77 | HR 63 | Temp 98.2°F | Resp 20 | Ht 62.0 in | Wt 141.8 lb

## 2022-09-27 DIAGNOSIS — Z86718 Personal history of other venous thrombosis and embolism: Secondary | ICD-10-CM | POA: Insufficient documentation

## 2022-09-27 DIAGNOSIS — D693 Immune thrombocytopenic purpura: Secondary | ICD-10-CM | POA: Diagnosis not present

## 2022-09-27 DIAGNOSIS — Z7901 Long term (current) use of anticoagulants: Secondary | ICD-10-CM | POA: Diagnosis not present

## 2022-09-27 LAB — CBC WITH DIFFERENTIAL (CANCER CENTER ONLY)
Abs Immature Granulocytes: 0.01 10*3/uL (ref 0.00–0.07)
Basophils Absolute: 0.1 10*3/uL (ref 0.0–0.1)
Basophils Relative: 1 %
Eosinophils Absolute: 0.1 10*3/uL (ref 0.0–0.5)
Eosinophils Relative: 1 %
HCT: 38.7 % (ref 36.0–46.0)
Hemoglobin: 13.1 g/dL (ref 12.0–15.0)
Immature Granulocytes: 0 %
Lymphocytes Relative: 50 %
Lymphs Abs: 2.2 10*3/uL (ref 0.7–4.0)
MCH: 32.1 pg (ref 26.0–34.0)
MCHC: 33.9 g/dL (ref 30.0–36.0)
MCV: 94.9 fL (ref 80.0–100.0)
Monocytes Absolute: 0.4 10*3/uL (ref 0.1–1.0)
Monocytes Relative: 8 %
Neutro Abs: 1.8 10*3/uL (ref 1.7–7.7)
Neutrophils Relative %: 40 %
Platelet Count: 113 10*3/uL — ABNORMAL LOW (ref 150–400)
RBC: 4.08 MIL/uL (ref 3.87–5.11)
RDW: 12.9 % (ref 11.5–15.5)
WBC Count: 4.5 10*3/uL (ref 4.0–10.5)
nRBC: 0 % (ref 0.0–0.2)

## 2022-09-27 LAB — D-DIMER, QUANTITATIVE: D-Dimer, Quant: <0.27 ug/mL-FEU (ref 0.00–0.50)

## 2022-09-27 NOTE — Progress Notes (Signed)
  [facility] OFFICE PROGRESS NOTE   Diagnosis: ITP, DVT  INTERVAL HISTORY:   Christine Brady returns as scheduled.  She has been off of anticoagulation therapy since July.  No leg swelling or erythema.  She had discomfort in the left leg during the month of September.  This has resolved.  She continues to have knee pain.  She is working.  She is no longer participating in physical therapy.  Objective:  Vital signs in last 24 hours:  Blood pressure 128/77, pulse 63, temperature 98.2 F (36.8 C), temperature source Oral, resp. rate 20, height '5\' 2"'$  (1.575 m), weight 141 lb 12.8 oz (64.3 kg), SpO2 100 %.    Resp: Lungs clear bilaterally Cardio: Regular rate and rhythm GI: No hepatosplenomegaly Vascular: No leg edema, tenderness, or erythema    Lab Results:  Lab Results  Component Value Date   WBC 4.5 09/27/2022   HGB 13.1 09/27/2022   HCT 38.7 09/27/2022   MCV 94.9 09/27/2022   PLT 113 (L) 09/27/2022   NEUTROABS 1.8 09/27/2022    CMP  Lab Results  Component Value Date   NA 136 02/18/2022   K 4.4 02/18/2022   CL 108 02/18/2022   CO2 21 (L) 02/18/2022   GLUCOSE 106 (H) 02/18/2022   BUN <5 (L) 02/18/2022   CREATININE 0.73 02/18/2022   CALCIUM 8.5 (L) 02/18/2022   PROT 4.5 (L) 02/18/2022   ALBUMIN 3.6 02/18/2022   AST 15 02/18/2022   ALT 8 02/18/2022   ALKPHOS 11 (L) 02/18/2022   BILITOT 0.8 02/18/2022   GFRNONAA >60 02/18/2022   GFRAA >60 07/12/2019      Medications: I have reviewed the patient's current medications.   Assessment/Plan: Left peroneal DVT 12/09/2021-apixaban Apixaban discontinued approximately 06/27/2022  Chronic ITP  3.   Right knee arthroscopy with partial lateral meniscectomy and removal of loose body, open patellofemoral ligament reconstruction 10/05/2021  4.   Fine "petechial "rash at the pretibial area bilaterally  5.   Right lower back pain  6.   Family history of pulmonary fibrosis  7.   Family history of  SLE/Sjogren's  8.  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  Family history of ovarian cancer  10.  Elevated liver enzyme  11.  Hearing loss  12.  Vitamin B12 deficiency diagnosed January 2023  13.  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  She has chronic mild thrombocytopenia.  There is no clinical evidence of recurrent venous thrombosis.  She would like to check a D-dimer today.  She will return for an office and lab visit in 6 months.  She will call in the interim for new symptoms.  Christine Coder, MD  09/27/2022  8:36 AM

## 2022-09-28 ENCOUNTER — Other Ambulatory Visit (HOSPITAL_COMMUNITY): Payer: Self-pay

## 2022-09-28 DIAGNOSIS — Z1231 Encounter for screening mammogram for malignant neoplasm of breast: Secondary | ICD-10-CM | POA: Diagnosis not present

## 2022-09-28 DIAGNOSIS — F5104 Psychophysiologic insomnia: Secondary | ICD-10-CM | POA: Diagnosis not present

## 2022-09-28 DIAGNOSIS — Z6826 Body mass index (BMI) 26.0-26.9, adult: Secondary | ICD-10-CM | POA: Diagnosis not present

## 2022-09-28 DIAGNOSIS — M8580 Other specified disorders of bone density and structure, unspecified site: Secondary | ICD-10-CM | POA: Diagnosis not present

## 2022-09-28 DIAGNOSIS — K562 Volvulus: Secondary | ICD-10-CM | POA: Diagnosis not present

## 2022-09-28 DIAGNOSIS — Z01419 Encounter for gynecological examination (general) (routine) without abnormal findings: Secondary | ICD-10-CM | POA: Diagnosis not present

## 2022-09-28 MED ORDER — DOXEPIN HCL 3 MG PO TABS
3.0000 mg | ORAL_TABLET | Freq: Every evening | ORAL | 6 refills | Status: DC
  Filled 2022-09-28: qty 30, 30d supply, fill #0
  Filled 2022-12-08: qty 30, 30d supply, fill #1
  Filled 2023-01-24: qty 30, 30d supply, fill #2

## 2022-09-29 ENCOUNTER — Other Ambulatory Visit (HOSPITAL_COMMUNITY): Payer: Self-pay

## 2022-10-01 ENCOUNTER — Other Ambulatory Visit (HOSPITAL_COMMUNITY): Payer: Self-pay

## 2022-10-06 ENCOUNTER — Encounter: Payer: Self-pay | Admitting: Orthopaedic Surgery

## 2022-10-06 ENCOUNTER — Ambulatory Visit (INDEPENDENT_AMBULATORY_CARE_PROVIDER_SITE_OTHER): Payer: 59 | Admitting: Orthopaedic Surgery

## 2022-10-06 ENCOUNTER — Ambulatory Visit (INDEPENDENT_AMBULATORY_CARE_PROVIDER_SITE_OTHER): Payer: 59

## 2022-10-06 ENCOUNTER — Other Ambulatory Visit (HOSPITAL_COMMUNITY): Payer: Self-pay

## 2022-10-06 DIAGNOSIS — M25552 Pain in left hip: Secondary | ICD-10-CM | POA: Diagnosis not present

## 2022-10-06 DIAGNOSIS — M7062 Trochanteric bursitis, left hip: Secondary | ICD-10-CM

## 2022-10-06 MED ORDER — LIDOCAINE HCL 1 % IJ SOLN
3.0000 mL | INTRAMUSCULAR | Status: AC | PRN
  Administered 2022-10-06: 3 mL

## 2022-10-06 MED ORDER — METHYLPREDNISOLONE ACETATE 40 MG/ML IJ SUSP
40.0000 mg | INTRAMUSCULAR | Status: AC | PRN
  Administered 2022-10-06: 40 mg via INTRA_ARTICULAR

## 2022-10-06 MED ORDER — CELECOXIB 200 MG PO CAPS
200.0000 mg | ORAL_CAPSULE | Freq: Two times a day (BID) | ORAL | 1 refills | Status: DC | PRN
  Filled 2022-10-06: qty 60, 30d supply, fill #0

## 2022-10-06 NOTE — Progress Notes (Signed)
[name] comes in today for evaluation treatment of left hip pain.  I have seen her remotely in the past and provided a steroid injection of the trochanteric area which she said helped even the next day.  She actually saw in my partners 2 weeks ago and they provided a steroid injection under ultrasound guidance over the trochanteric area on the left side.  She said that has not helped her at all and her pain is actually getting little worse.  She is on her feet all day working.  She does take meloxicam and does not help much at all.  She does walk with a slight limp.  She has full internal and external rotation of her left hip and the pain is only over the trochanteric area but also radiates into the IT band.  There is no radicular component in terms of sciatica.  There is no strength limitations.  An AP pelvis and lateral of the left hip are unremarkable.  The hip joints are well-maintained and there is no cortical irregularities around the trochanteric area.  I did place a steroid injection over the point of maximal tenderness over trochanteric area.  I will send in Celebrex to try instead of meloxicam for inflammation and I have recommended Voltaren gel to this area.  If things worsen she will let us know.  She cannot have a MRI at this standpoint so we would have to consider other imaging studies or even surgical intervention if things are not getting better.  She understands this as well and will let us know.       Procedure Note  Patient: Christine Brady             Date of Birth: [birth year]           MRN: [id number]             Visit Date: 10/06/2022  Procedures: Visit Diagnoses:  1. Pain in left hip   2. Trochanteric bursitis, left hip     Large Joint Inj: L greater trochanter on 10/06/2022 2:10 PM Indications: pain and diagnostic evaluation Details: 22 G 1.5 in needle, lateral approach  Arthrogram: No  Medications: 3 mL lidocaine 1 %; 40 mg methylPREDNISolone acetate 40  MG/ML Outcome: tolerated well, no immediate complications Procedure, treatment alternatives, risks and benefits explained, specific risks discussed. Consent was given by the patient. Immediately prior to procedure a time out was called to verify the correct patient, procedure, equipment, support staff and site/side marked as required. Patient was prepped and draped in the usual sterile fashion.

## 2022-10-21 ENCOUNTER — Other Ambulatory Visit (HOSPITAL_COMMUNITY): Payer: Self-pay

## 2022-10-28 DIAGNOSIS — H93212 Auditory recruitment, left ear: Secondary | ICD-10-CM | POA: Diagnosis not present

## 2022-10-28 DIAGNOSIS — H7411 Adhesive right middle ear disease: Secondary | ICD-10-CM | POA: Diagnosis not present

## 2022-10-28 DIAGNOSIS — H90A11 Conductive hearing loss, unilateral, right ear with restricted hearing on the contralateral side: Secondary | ICD-10-CM | POA: Diagnosis not present

## 2022-10-28 DIAGNOSIS — Z9621 Cochlear implant status: Secondary | ICD-10-CM | POA: Diagnosis not present

## 2022-10-28 DIAGNOSIS — H9313 Tinnitus, bilateral: Secondary | ICD-10-CM | POA: Diagnosis not present

## 2022-10-28 DIAGNOSIS — H6993 Unspecified Eustachian tube disorder, bilateral: Secondary | ICD-10-CM | POA: Diagnosis not present

## 2022-10-28 DIAGNOSIS — Z9089 Acquired absence of other organs: Secondary | ICD-10-CM | POA: Diagnosis not present

## 2022-10-28 DIAGNOSIS — H93222 Diplacusis, left ear: Secondary | ICD-10-CM | POA: Diagnosis not present

## 2022-10-28 DIAGNOSIS — Z9889 Other specified postprocedural states: Secondary | ICD-10-CM | POA: Diagnosis not present

## 2022-11-02 ENCOUNTER — Other Ambulatory Visit (HOSPITAL_COMMUNITY): Payer: Self-pay

## 2022-11-05 ENCOUNTER — Other Ambulatory Visit (HOSPITAL_COMMUNITY): Payer: Self-pay

## 2022-12-02 DIAGNOSIS — M1711 Unilateral primary osteoarthritis, right knee: Secondary | ICD-10-CM | POA: Diagnosis not present

## 2022-12-02 DIAGNOSIS — M25561 Pain in right knee: Secondary | ICD-10-CM | POA: Diagnosis not present

## 2022-12-08 ENCOUNTER — Other Ambulatory Visit: Payer: Self-pay

## 2022-12-08 ENCOUNTER — Other Ambulatory Visit (HOSPITAL_COMMUNITY): Payer: Self-pay

## 2022-12-09 ENCOUNTER — Other Ambulatory Visit: Payer: Self-pay

## 2022-12-09 ENCOUNTER — Other Ambulatory Visit (HOSPITAL_COMMUNITY): Payer: Self-pay

## 2023-01-14 DIAGNOSIS — M170 Bilateral primary osteoarthritis of knee: Secondary | ICD-10-CM | POA: Diagnosis not present

## 2023-01-24 ENCOUNTER — Other Ambulatory Visit (HOSPITAL_COMMUNITY): Payer: Self-pay

## 2023-01-25 ENCOUNTER — Other Ambulatory Visit (HOSPITAL_COMMUNITY): Payer: Self-pay

## 2023-01-26 ENCOUNTER — Other Ambulatory Visit: Payer: Self-pay

## 2023-01-26 ENCOUNTER — Other Ambulatory Visit (HOSPITAL_COMMUNITY): Payer: Self-pay

## 2023-02-02 ENCOUNTER — Other Ambulatory Visit (HOSPITAL_COMMUNITY): Payer: Self-pay

## 2023-02-28 DIAGNOSIS — H5203 Hypermetropia, bilateral: Secondary | ICD-10-CM | POA: Diagnosis not present

## 2023-03-02 ENCOUNTER — Other Ambulatory Visit (HOSPITAL_COMMUNITY): Payer: Self-pay

## 2023-03-08 ENCOUNTER — Other Ambulatory Visit (HOSPITAL_COMMUNITY): Payer: Self-pay

## 2023-03-14 ENCOUNTER — Other Ambulatory Visit (HOSPITAL_COMMUNITY): Payer: Self-pay

## 2023-03-31 ENCOUNTER — Inpatient Hospital Stay (HOSPITAL_BASED_OUTPATIENT_CLINIC_OR_DEPARTMENT_OTHER): Payer: 59 | Admitting: Oncology

## 2023-03-31 ENCOUNTER — Inpatient Hospital Stay: Payer: 59 | Attending: Oncology

## 2023-03-31 VITALS — BP 131/83 | HR 68 | Temp 98.2°F | Resp 18 | Ht 62.0 in | Wt 142.4 lb

## 2023-03-31 DIAGNOSIS — Z8041 Family history of malignant neoplasm of ovary: Secondary | ICD-10-CM | POA: Insufficient documentation

## 2023-03-31 DIAGNOSIS — Z85828 Personal history of other malignant neoplasm of skin: Secondary | ICD-10-CM | POA: Insufficient documentation

## 2023-03-31 DIAGNOSIS — R748 Abnormal levels of other serum enzymes: Secondary | ICD-10-CM | POA: Diagnosis not present

## 2023-03-31 DIAGNOSIS — Z86718 Personal history of other venous thrombosis and embolism: Secondary | ICD-10-CM | POA: Insufficient documentation

## 2023-03-31 DIAGNOSIS — D693 Immune thrombocytopenic purpura: Secondary | ICD-10-CM

## 2023-03-31 DIAGNOSIS — M545 Low back pain, unspecified: Secondary | ICD-10-CM | POA: Diagnosis not present

## 2023-03-31 DIAGNOSIS — E538 Deficiency of other specified B group vitamins: Secondary | ICD-10-CM | POA: Insufficient documentation

## 2023-03-31 DIAGNOSIS — H9190 Unspecified hearing loss, unspecified ear: Secondary | ICD-10-CM | POA: Diagnosis not present

## 2023-03-31 DIAGNOSIS — Z7901 Long term (current) use of anticoagulants: Secondary | ICD-10-CM | POA: Insufficient documentation

## 2023-03-31 LAB — CBC WITH DIFFERENTIAL (CANCER CENTER ONLY)
Abs Immature Granulocytes: 0 10*3/uL (ref 0.00–0.07)
Basophils Absolute: 0 10*3/uL (ref 0.0–0.1)
Basophils Relative: 1 %
Eosinophils Absolute: 0 10*3/uL (ref 0.0–0.5)
Eosinophils Relative: 1 %
HCT: 39.2 % (ref 36.0–46.0)
Hemoglobin: 13.2 g/dL (ref 12.0–15.0)
Immature Granulocytes: 0 %
Lymphocytes Relative: 46 %
Lymphs Abs: 1.8 10*3/uL (ref 0.7–4.0)
MCH: 32 pg (ref 26.0–34.0)
MCHC: 33.7 g/dL (ref 30.0–36.0)
MCV: 95.1 fL (ref 80.0–100.0)
Monocytes Absolute: 0.3 10*3/uL (ref 0.1–1.0)
Monocytes Relative: 8 %
Neutro Abs: 1.7 10*3/uL (ref 1.7–7.7)
Neutrophils Relative %: 44 %
Platelet Count: 111 10*3/uL — ABNORMAL LOW (ref 150–400)
RBC: 4.12 MIL/uL (ref 3.87–5.11)
RDW: 13.3 % (ref 11.5–15.5)
WBC Count: 3.8 10*3/uL — ABNORMAL LOW (ref 4.0–10.5)
nRBC: 0 % (ref 0.0–0.2)

## 2023-03-31 NOTE — Progress Notes (Signed)
  [facility] OFFICE PROGRESS NOTE   Diagnosis: ITP  INTERVAL HISTORY:   Christine Brady returns as scheduled.  She feels well.  No bleeding or symptom of recurrent thrombosis.  Leg strength has improved.  She is walking for exercise.  Objective:  Vital signs in last 24 hours:  Blood pressure 131/83, pulse 68, temperature 98.2 F (36.8 C), temperature source Oral, resp. rate 18, height  (1.575 m), weight 142 lb 6.4 oz (64.6 kg), SpO2 100 %.    Lymphatics: No cervical, supraclavicular, axillary, or inguinal nodes Resp: Lungs clear bilaterally Cardio: Distant heart sounds, regular rate and rhythm GI: No hepatosplenomegaly Vascular: No leg edema    Lab Results:  Lab Results  Component Value Date   WBC 4.5 09/27/2022   HGB 13.1 09/27/2022   HCT 38.7 09/27/2022   MCV 94.9 09/27/2022   PLT 113 (L) 09/27/2022   NEUTROABS 1.8 09/27/2022    CMP  Lab Results  Component Value Date   NA 136 02/18/2022   K 4.4 02/18/2022   CL 108 02/18/2022   CO2 21 (L) 02/18/2022   GLUCOSE 106 (H) 02/18/2022   BUN <5 (L) 02/18/2022   CREATININE 0.73 02/18/2022   CALCIUM 8.5 (L) 02/18/2022   PROT 4.5 (L) 02/18/2022   ALBUMIN 3.6 02/18/2022   AST 15 02/18/2022   ALT 8 02/18/2022   ALKPHOS 11 (L) 02/18/2022   BILITOT 0.8 02/18/2022   GFRNONAA >60 02/18/2022   GFRAA >60 07/12/2019    Medications: I have reviewed the patient's current medications.   Assessment/Plan: Left peroneal DVT 12/09/2021-apixaban Apixaban discontinued approximately 06/27/2022  Chronic ITP  3.   Right knee arthroscopy with partial lateral meniscectomy and removal of loose body, open patellofemoral ligament reconstruction 10/05/2021  4.   History of fine "petechial "rash at the pretibial area bilaterally  5.   Right lower back pain  6.   Family history of pulmonary fibrosis  7.   Family history of SLE/Sjogren's  8.  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  Family history of  ovarian cancer  10.  Elevated liver enzyme  11.  Hearing loss  12.  Vitamin B12 deficiency diagnosed January 2023  13.  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  She has persistent mild thrombocytopenia.  She will call for spontaneous bleeding or bruising.  She will return for an office visit and CBC in 9 months.  Thornton Papas, MD  03/31/2023  8:04 AM

## 2023-04-26 ENCOUNTER — Other Ambulatory Visit (HOSPITAL_COMMUNITY): Payer: Self-pay

## 2023-04-26 DIAGNOSIS — Z1331 Encounter for screening for depression: Secondary | ICD-10-CM | POA: Diagnosis not present

## 2023-04-26 DIAGNOSIS — Z6825 Body mass index (BMI) 25.0-25.9, adult: Secondary | ICD-10-CM | POA: Diagnosis not present

## 2023-04-26 DIAGNOSIS — Z0000 Encounter for general adult medical examination without abnormal findings: Secondary | ICD-10-CM | POA: Diagnosis not present

## 2023-04-26 DIAGNOSIS — Z1339 Encounter for screening examination for other mental health and behavioral disorders: Secondary | ICD-10-CM | POA: Diagnosis not present

## 2023-04-26 MED ORDER — POLYETHYLENE GLYCOL 3350 17 GM/SCOOP PO POWD
17.0000 g | Freq: Every day | ORAL | 4 refills | Status: DC
  Filled 2023-04-26: qty 238, 14d supply, fill #0
  Filled 2023-07-20: qty 476, 28d supply, fill #0

## 2023-04-26 MED ORDER — OMEPRAZOLE 40 MG PO CPDR
40.0000 mg | DELAYED_RELEASE_CAPSULE | Freq: Every day | ORAL | 4 refills | Status: DC
  Filled 2023-04-26: qty 90, 90d supply, fill #0
  Filled 2024-02-02: qty 90, 90d supply, fill #1

## 2023-04-26 MED ORDER — ROSUVASTATIN CALCIUM 5 MG PO TABS
5.0000 mg | ORAL_TABLET | ORAL | 4 refills | Status: DC
  Filled 2023-04-26: qty 24, 84d supply, fill #0
  Filled 2023-07-20: qty 24, 84d supply, fill #1
  Filled 2023-10-21: qty 24, 84d supply, fill #2
  Filled 2024-01-16: qty 24, 84d supply, fill #3
  Filled 2024-04-11: qty 24, 84d supply, fill #4

## 2023-04-28 ENCOUNTER — Other Ambulatory Visit (HOSPITAL_COMMUNITY): Payer: Self-pay

## 2023-04-28 DIAGNOSIS — H900 Conductive hearing loss, bilateral: Secondary | ICD-10-CM | POA: Diagnosis not present

## 2023-04-28 DIAGNOSIS — Z9089 Acquired absence of other organs: Secondary | ICD-10-CM | POA: Diagnosis not present

## 2023-04-28 DIAGNOSIS — H9313 Tinnitus, bilateral: Secondary | ICD-10-CM | POA: Diagnosis not present

## 2023-04-28 DIAGNOSIS — Z9621 Cochlear implant status: Secondary | ICD-10-CM | POA: Diagnosis not present

## 2023-04-28 DIAGNOSIS — H93222 Diplacusis, left ear: Secondary | ICD-10-CM | POA: Diagnosis not present

## 2023-04-28 DIAGNOSIS — D693 Immune thrombocytopenic purpura: Secondary | ICD-10-CM | POA: Diagnosis not present

## 2023-04-28 DIAGNOSIS — H90A11 Conductive hearing loss, unilateral, right ear with restricted hearing on the contralateral side: Secondary | ICD-10-CM | POA: Diagnosis not present

## 2023-04-28 DIAGNOSIS — H9190 Unspecified hearing loss, unspecified ear: Secondary | ICD-10-CM | POA: Diagnosis not present

## 2023-04-28 DIAGNOSIS — H7411 Adhesive right middle ear disease: Secondary | ICD-10-CM | POA: Diagnosis not present

## 2023-04-28 DIAGNOSIS — Z77122 Contact with and (suspected) exposure to noise: Secondary | ICD-10-CM | POA: Diagnosis not present

## 2023-04-28 DIAGNOSIS — H906 Mixed conductive and sensorineural hearing loss, bilateral: Secondary | ICD-10-CM | POA: Diagnosis not present

## 2023-04-28 DIAGNOSIS — Z9889 Other specified postprocedural states: Secondary | ICD-10-CM | POA: Diagnosis not present

## 2023-04-28 DIAGNOSIS — H833X1 Noise effects on right inner ear: Secondary | ICD-10-CM | POA: Diagnosis not present

## 2023-04-28 MED ORDER — TRIAMTERENE-HCTZ 37.5-25 MG PO CAPS
1.0000 | ORAL_CAPSULE | Freq: Every morning | ORAL | 0 refills | Status: DC
  Filled 2023-04-28: qty 90, 90d supply, fill #0

## 2023-04-28 MED ORDER — PREDNISONE 10 MG PO TABS
10.0000 mg | ORAL_TABLET | ORAL | 2 refills | Status: DC
  Filled 2023-04-28: qty 30, 5d supply, fill #0

## 2023-07-20 ENCOUNTER — Other Ambulatory Visit: Payer: Self-pay

## 2023-07-20 ENCOUNTER — Other Ambulatory Visit (HOSPITAL_COMMUNITY): Payer: Self-pay

## 2023-07-21 ENCOUNTER — Other Ambulatory Visit (HOSPITAL_COMMUNITY): Payer: Self-pay

## 2023-07-22 ENCOUNTER — Other Ambulatory Visit (HOSPITAL_COMMUNITY): Payer: Self-pay

## 2023-07-29 DIAGNOSIS — H9313 Tinnitus, bilateral: Secondary | ICD-10-CM | POA: Diagnosis not present

## 2023-07-29 DIAGNOSIS — H93222 Diplacusis, left ear: Secondary | ICD-10-CM | POA: Diagnosis not present

## 2023-07-29 DIAGNOSIS — H7411 Adhesive right middle ear disease: Secondary | ICD-10-CM | POA: Diagnosis not present

## 2023-07-29 DIAGNOSIS — Z9089 Acquired absence of other organs: Secondary | ICD-10-CM | POA: Diagnosis not present

## 2023-07-29 DIAGNOSIS — H7202 Central perforation of tympanic membrane, left ear: Secondary | ICD-10-CM | POA: Diagnosis not present

## 2023-07-29 DIAGNOSIS — H6993 Unspecified Eustachian tube disorder, bilateral: Secondary | ICD-10-CM | POA: Diagnosis not present

## 2023-07-29 DIAGNOSIS — Z9889 Other specified postprocedural states: Secondary | ICD-10-CM | POA: Diagnosis not present

## 2023-07-29 DIAGNOSIS — H833X1 Noise effects on right inner ear: Secondary | ICD-10-CM | POA: Diagnosis not present

## 2023-07-29 DIAGNOSIS — Z9621 Cochlear implant status: Secondary | ICD-10-CM | POA: Diagnosis not present

## 2023-08-04 ENCOUNTER — Other Ambulatory Visit: Payer: Self-pay | Admitting: Medical Genetics

## 2023-08-04 ENCOUNTER — Other Ambulatory Visit (HOSPITAL_COMMUNITY)
Admission: RE | Admit: 2023-08-04 | Discharge: 2023-08-04 | Disposition: A | Discharge status: Home / Self Care | Payer: 59 | Attending: Oncology | Admitting: Oncology

## 2023-08-04 DIAGNOSIS — Z006 Encounter for examination for normal comparison and control in clinical research program: Secondary | ICD-10-CM | POA: Insufficient documentation

## 2023-08-20 LAB — GENECONNECT MOLECULAR SCREEN: Genetic Analysis Overall Interpretation: NEGATIVE

## 2023-08-20 LAB — HELIX MOLECULAR SCREEN

## 2023-10-04 DIAGNOSIS — L814 Other melanin hyperpigmentation: Secondary | ICD-10-CM | POA: Diagnosis not present

## 2023-10-04 DIAGNOSIS — Z85828 Personal history of other malignant neoplasm of skin: Secondary | ICD-10-CM | POA: Diagnosis not present

## 2023-10-04 DIAGNOSIS — D225 Melanocytic nevi of trunk: Secondary | ICD-10-CM | POA: Diagnosis not present

## 2023-10-04 DIAGNOSIS — L821 Other seborrheic keratosis: Secondary | ICD-10-CM | POA: Diagnosis not present

## 2023-10-04 DIAGNOSIS — D2272 Melanocytic nevi of left lower limb, including hip: Secondary | ICD-10-CM | POA: Diagnosis not present

## 2023-10-04 DIAGNOSIS — L570 Actinic keratosis: Secondary | ICD-10-CM | POA: Diagnosis not present

## 2023-10-04 DIAGNOSIS — L578 Other skin changes due to chronic exposure to nonionizing radiation: Secondary | ICD-10-CM | POA: Diagnosis not present

## 2023-10-10 ENCOUNTER — Other Ambulatory Visit (HOSPITAL_COMMUNITY): Payer: Self-pay

## 2023-10-10 ENCOUNTER — Other Ambulatory Visit: Payer: Self-pay | Admitting: Gastroenterology

## 2023-10-10 MED ORDER — DICYCLOMINE HCL 10 MG PO CAPS
10.0000 mg | ORAL_CAPSULE | Freq: Three times a day (TID) | ORAL | 0 refills | Status: AC | PRN
  Filled 2023-10-10: qty 15, 5d supply, fill #0

## 2023-10-11 ENCOUNTER — Other Ambulatory Visit (HOSPITAL_COMMUNITY): Payer: Self-pay

## 2023-10-14 ENCOUNTER — Other Ambulatory Visit (HOSPITAL_COMMUNITY): Payer: Self-pay

## 2023-10-21 ENCOUNTER — Other Ambulatory Visit (HOSPITAL_COMMUNITY): Payer: Self-pay

## 2023-10-27 ENCOUNTER — Encounter: Payer: Self-pay | Admitting: Neurology

## 2023-10-31 ENCOUNTER — Emergency Department (HOSPITAL_COMMUNITY): Payer: 59

## 2023-10-31 ENCOUNTER — Inpatient Hospital Stay (HOSPITAL_COMMUNITY)
Admission: EM | Admit: 2023-10-31 | Discharge: 2023-11-04 | DRG: 813 | Disposition: A | Discharge status: Home / Self Care | Payer: 59 | Attending: Internal Medicine | Admitting: Internal Medicine

## 2023-10-31 ENCOUNTER — Other Ambulatory Visit: Payer: Self-pay

## 2023-10-31 ENCOUNTER — Encounter (HOSPITAL_COMMUNITY): Payer: Self-pay | Admitting: Emergency Medicine

## 2023-10-31 DIAGNOSIS — R29818 Other symptoms and signs involving the nervous system: Secondary | ICD-10-CM | POA: Diagnosis not present

## 2023-10-31 DIAGNOSIS — R209 Unspecified disturbances of skin sensation: Principal | ICD-10-CM

## 2023-10-31 DIAGNOSIS — K219 Gastro-esophageal reflux disease without esophagitis: Secondary | ICD-10-CM | POA: Diagnosis present

## 2023-10-31 DIAGNOSIS — D693 Immune thrombocytopenic purpura: Secondary | ICD-10-CM | POA: Diagnosis not present

## 2023-10-31 DIAGNOSIS — Z9049 Acquired absence of other specified parts of digestive tract: Secondary | ICD-10-CM

## 2023-10-31 DIAGNOSIS — R200 Anesthesia of skin: Secondary | ICD-10-CM | POA: Diagnosis not present

## 2023-10-31 DIAGNOSIS — G6181 Chronic inflammatory demyelinating polyneuritis: Secondary | ICD-10-CM | POA: Diagnosis present

## 2023-10-31 DIAGNOSIS — Z886 Allergy status to analgesic agent status: Secondary | ICD-10-CM

## 2023-10-31 DIAGNOSIS — M1990 Unspecified osteoarthritis, unspecified site: Secondary | ICD-10-CM | POA: Diagnosis present

## 2023-10-31 DIAGNOSIS — R202 Paresthesia of skin: Secondary | ICD-10-CM | POA: Diagnosis not present

## 2023-10-31 DIAGNOSIS — R946 Abnormal results of thyroid function studies: Secondary | ICD-10-CM | POA: Diagnosis present

## 2023-10-31 DIAGNOSIS — G4486 Cervicogenic headache: Secondary | ICD-10-CM | POA: Diagnosis not present

## 2023-10-31 DIAGNOSIS — R208 Other disturbances of skin sensation: Secondary | ICD-10-CM | POA: Diagnosis not present

## 2023-10-31 DIAGNOSIS — Z885 Allergy status to narcotic agent status: Secondary | ICD-10-CM

## 2023-10-31 DIAGNOSIS — Z862 Personal history of diseases of the blood and blood-forming organs and certain disorders involving the immune mechanism: Secondary | ICD-10-CM

## 2023-10-31 DIAGNOSIS — Z887 Allergy status to serum and vaccine status: Secondary | ICD-10-CM

## 2023-10-31 DIAGNOSIS — R7989 Other specified abnormal findings of blood chemistry: Secondary | ICD-10-CM | POA: Diagnosis present

## 2023-10-31 DIAGNOSIS — K589 Irritable bowel syndrome without diarrhea: Secondary | ICD-10-CM | POA: Diagnosis present

## 2023-10-31 DIAGNOSIS — M4802 Spinal stenosis, cervical region: Secondary | ICD-10-CM | POA: Diagnosis not present

## 2023-10-31 DIAGNOSIS — M79672 Pain in left foot: Secondary | ICD-10-CM | POA: Diagnosis not present

## 2023-10-31 DIAGNOSIS — Z86718 Personal history of other venous thrombosis and embolism: Secondary | ICD-10-CM

## 2023-10-31 DIAGNOSIS — R292 Abnormal reflex: Secondary | ICD-10-CM | POA: Diagnosis present

## 2023-10-31 DIAGNOSIS — G379 Demyelinating disease of central nervous system, unspecified: Secondary | ICD-10-CM | POA: Diagnosis not present

## 2023-10-31 DIAGNOSIS — M5134 Other intervertebral disc degeneration, thoracic region: Secondary | ICD-10-CM | POA: Diagnosis not present

## 2023-10-31 DIAGNOSIS — M4184 Other forms of scoliosis, thoracic region: Secondary | ICD-10-CM | POA: Diagnosis not present

## 2023-10-31 DIAGNOSIS — E785 Hyperlipidemia, unspecified: Secondary | ICD-10-CM | POA: Diagnosis present

## 2023-10-31 DIAGNOSIS — G629 Polyneuropathy, unspecified: Secondary | ICD-10-CM

## 2023-10-31 DIAGNOSIS — M79671 Pain in right foot: Secondary | ICD-10-CM | POA: Diagnosis not present

## 2023-10-31 DIAGNOSIS — Z79899 Other long term (current) drug therapy: Secondary | ICD-10-CM

## 2023-10-31 DIAGNOSIS — Z888 Allergy status to other drugs, medicaments and biological substances status: Secondary | ICD-10-CM

## 2023-10-31 LAB — CBC WITH DIFFERENTIAL/PLATELET
Abs Immature Granulocytes: 0.01 10*3/uL (ref 0.00–0.07)
Basophils Absolute: 0.1 10*3/uL (ref 0.0–0.1)
Basophils Relative: 1 %
Eosinophils Absolute: 0.1 10*3/uL (ref 0.0–0.5)
Eosinophils Relative: 2 %
HCT: 36.4 % (ref 36.0–46.0)
Hemoglobin: 12.2 g/dL (ref 12.0–15.0)
Immature Granulocytes: 0 %
Lymphocytes Relative: 51 %
Lymphs Abs: 2.7 10*3/uL (ref 0.7–4.0)
MCH: 31.8 pg (ref 26.0–34.0)
MCHC: 33.5 g/dL (ref 30.0–36.0)
MCV: 94.8 fL (ref 80.0–100.0)
Monocytes Absolute: 0.4 10*3/uL (ref 0.1–1.0)
Monocytes Relative: 7 %
Neutro Abs: 2.1 10*3/uL (ref 1.7–7.7)
Neutrophils Relative %: 39 %
Platelets: 106 10*3/uL — ABNORMAL LOW (ref 150–400)
RBC: 3.84 MIL/uL — ABNORMAL LOW (ref 3.87–5.11)
RDW: 12.7 % (ref 11.5–15.5)
WBC: 5.3 10*3/uL (ref 4.0–10.5)
nRBC: 0 % (ref 0.0–0.2)

## 2023-10-31 LAB — COMPREHENSIVE METABOLIC PANEL
ALT: 12 U/L (ref 0–44)
AST: 17 U/L (ref 15–41)
Albumin: 4.1 g/dL (ref 3.5–5.0)
Alkaline Phosphatase: 35 U/L — ABNORMAL LOW (ref 38–126)
Anion gap: 8 (ref 5–15)
BUN: 19 mg/dL (ref 8–23)
CO2: 23 mmol/L (ref 22–32)
Calcium: 9.5 mg/dL (ref 8.9–10.3)
Chloride: 105 mmol/L (ref 98–111)
Creatinine, Ser: 0.74 mg/dL (ref 0.44–1.00)
GFR, Estimated: >60 mL/min (ref >60)
Glucose, Bld: 98 mg/dL (ref 70–99)
Potassium: 4 mmol/L (ref 3.5–5.1)
Sodium: 136 mmol/L (ref 135–145)
Total Bilirubin: 0.9 mg/dL (ref <1.2)
Total Protein: 6.7 g/dL (ref 6.5–8.1)

## 2023-10-31 LAB — MAGNESIUM: Magnesium: 2.1 mg/dL (ref 1.7–2.4)

## 2023-10-31 MED ORDER — ACETAMINOPHEN 500 MG PO TABS
1000.0000 mg | ORAL_TABLET | Freq: Once | ORAL | Status: AC
  Administered 2023-11-01: 1000 mg via ORAL
  Filled 2023-10-31: qty 2

## 2023-10-31 MED ORDER — DIPHENHYDRAMINE HCL 50 MG/ML IJ SOLN
25.0000 mg | Freq: Once | INTRAMUSCULAR | Status: AC
  Administered 2023-11-01: 25 mg via INTRAVENOUS
  Filled 2023-10-31: qty 1

## 2023-10-31 MED ORDER — PROCHLORPERAZINE EDISYLATE 10 MG/2ML IJ SOLN
10.0000 mg | Freq: Once | INTRAMUSCULAR | Status: AC
  Administered 2023-11-01: 10 mg via INTRAVENOUS
  Filled 2023-10-31: qty 2

## 2023-10-31 NOTE — ED Provider Triage Note (Cosign Needed Addendum)
Emergency Medicine Provider Triage Evaluation Note  Christine Brady , a 62 y.o. female  was evaluated in triage.  Pt complains of abnormal sensation of the feet.  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  Patient states that symptoms she is having are the same as how her Guillain-Barr started previously.  She developed sensation of numbness and feeling like her socks were scrunched up in her shoes and the left foot starting 2 weeks ago.  This progressed to the right foot 1 week ago and is now in bilateral.  He called her neurologist who told her to come in for evaluation and Guillain-Barr rule out.  Not require intubation but she did have bulbar muscle involvement.  Patient also reports she had some blurry vision 2 weeks ago.  Patient has a metal plate in her head and that is not compatible with MRI.  Review of Systems  Positive: Feet sensation disturbance Negative: Weakness  Physical Exam  BP 135/71 (BP Location: Right Arm)   Pulse 65   Temp 98.1 F (36.7 C) (Oral)   Resp 17   Ht 5\' 2"  (1.575 m)   Wt 62.6 kg   SpO2 100%   BMI 25.24 kg/m  Gen:   Awake, no distress   Resp:  Normal effort  MSK:   Moves extremities without difficulty  Other:  3+ reflexes at the patella bilaterally, 2+ reflexes at the Achilles bilaterally, normal strength with dorsi and plantarflexion at the ankle  Medical Decision Making  Medically screening exam initiated at 8:49 PM.  Appropriate orders placed.  Marionna R Roache was informed that the remainder of the evaluation will be completed by another provider, this initial triage assessment does not replace that evaluation, and the importance of remaining in the ED until their evaluation is complete.  Workup initiated   Arthor Captain, PA-C 10/31/23 2053    Arthor Captain, PA-C 10/31/23 [id number]

## 2023-10-31 NOTE — ED Provider Notes (Signed)
[facility] Emergency Department Provider Note MRN:  [id number]  Arrival date & time: 11/01/23     Chief Complaint   Numbness   History of Present Illness   Christine Brady is a 62 y.o. year-old female with a history of cochlear implant, chronic ITP, DM Barr presenting to the ED with chief complaint of numbness.  Decree sensation to the bottom of the feet progressing over the past 2 weeks.  Sent here by neurology.  Started with some mild discomfort to the left lateral foot.  Initially thought maybe it was her shoes making her sore and so she switched shoes.  Then noticed discomfort/numbness to the right foot.  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  Has progressed to include the bottom of both feet.  Described as a clumsiness, feet feel very strange as if her socks are bunched up in her shoes.  Starting to feel it somewhat at the top of her feet.  Mild headache 2 weeks ago and today.  Occasional blurry vision, occasional paresthesias to bilateral hands but not currently.  No trouble with speech, no chest pain or abdominal pain, no shortness of breath, no weakness.  Review of Systems  A thorough review of systems was obtained and all systems are negative except as noted in the HPI and PMH.   Patient's Health History    Past Medical History:  Diagnosis Date   Abdominal pain    Arthritis    difficulty walking   Atypical chest pain 11/11/2016   Bowel obstruction (HCC)    Cholesteatoma of right ear    Chronic headaches    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HCC) 11/11/2016   Cyst    back   Hearing aid worn    History of ITP    Hyperlipidemia 11/11/2016   Large ovary 07/09/2019   PONV (postoperative nausea and vomiting)    Reflux     Past Surgical History:  Procedure Laterality Date   CHOLECYSTECTOMY  2010   ESOPHAGOGASTRODUODENOSCOPY (EGD) WITH PROPOFOL N/A 06/12/2019   Procedure:  ESOPHAGOGASTRODUODENOSCOPY (EGD) WITH PROPOFOL;  Surgeon: Jeani Hawking, MD;  Location: [facility];  Service: Endoscopy;  Laterality: N/A;   EXTERNAL EAR SURGERY  2009/2010   EXTERNAL EAR SURGERY  2010   right ear   IR FLUORO GUIDE CV LINE RIGHT  02/10/2022   IR US GUIDE VASC ACCESS RIGHT  02/10/2022   KNEE ARTHROSCOPY     right   KNEE ARTHROSCOPY WITH MEDIAL PATELLAR FEMORAL LIGAMENT RECONSTRUCTION Right 10/05/2021   Procedure: right knee arthroscoy, debridement, lateral menisectomy, medial patellar femoral ligament reconstruction;  Surgeon: Cammy Copa, MD;  Location: [facility];  Service: Orthopedics;  Laterality: Right;   KNEE LIGAMENT RECONSTRUCTION     left   MASTOIDECTOMY Right 01/19/2017   Procedure: RIGHT MODIFIED RADICAL MASTOIDECTOMY;  Surgeon: Ermalinda Barrios, MD;  Location: [facility];  Service: ENT;  Laterality: Right;   UPPER ESOPHAGEAL ENDOSCOPIC ULTRASOUND (EUS) N/A 06/12/2019   Procedure: UPPER ESOPHAGEAL ENDOSCOPIC ULTRASOUND (EUS);  Surgeon: Jeani Hawking, MD;  Location: [facility];  Service: Endoscopy;  Laterality: N/A;   WISDOM TOOTH EXTRACTION      Family History  Problem Relation Age of Onset   Diabetes Mother    Asthma Mother    Diabetes type II Mother    Alcohol abuse Mother    Hypertension Mother    Diabetes Father    Hypertension Father    Kidney  disease Father    Diabetes type II Father    Alcohol abuse Father    Crohn's disease Sister    COPD Sister    Thrombocytopenia Sister    Cerebral palsy Brother    Neuropathy Neg Hx     Social History   Socioeconomic History   Marital status: Single    Spouse name: Not on file   Number of children: Not on file   Years of education: Not on file   Highest education level: Not on file  Occupational History   Not on file  Tobacco Use   Smoking status: Never   Smokeless tobacco: Never  Vaping Use   Vaping status: Never Used  Substance and Sexual Activity   Alcohol use:  Yes    Comment: occas   Drug use: No   Sexual activity: Not on file  Other Topics Concern   Not on file  Social History Narrative   Not on file   Social Determinants of Health   Financial Resource Strain: Not on file  Food Insecurity: Low Risk  (07/29/2023)   Received from [facility]   Hunger Vital Sign    Worried About Running Out of Food in the Last Year: Never true    Ran Out of Food in the Last Year: Never true  Transportation Needs: Not on file (07/29/2023)  Physical Activity: Not on file  Stress: Not on file  Social Connections: Not on file  Intimate Partner Violence: Not on file     Physical Exam   Vitals:   11/01/23 0029 11/01/23 0030  BP:  111/78  Pulse:  65  Resp:  18  Temp: 97.9 F (36.6 C)   SpO2:  99%    CONSTITUTIONAL: Well-appearing, NAD NEURO/PSYCH:  Alert and oriented x 3, decreased sensation to bottom of bilateral feet EYES:  eyes equal and reactive ENT/NECK:  no LAD, no JVD CARDIO: Regular rate, well-perfused, normal S1 and S2 PULM:  CTAB no wheezing or rhonchi GI/GU:  non-distended, non-tender MSK/SPINE:  No gross deformities, no edema SKIN:  no rash, atraumatic   *Additional and/or pertinent findings included in MDM below  Diagnostic and Interventional Summary    EKG Interpretation Date/Time:    Ventricular Rate:    PR Interval:    QRS Duration:    QT Interval:    QTC Calculation:   R Axis:      Text Interpretation:         Labs Reviewed  CBC WITH DIFFERENTIAL/PLATELET - Abnormal; Notable for the following components:      Result Value   RBC 3.84 (*)    Platelets 106 (*)    All other components within normal limits  COMPREHENSIVE METABOLIC PANEL - Abnormal; Notable for the following components:   Alkaline Phosphatase 35 (*)    All other components within normal limits  TSH - Abnormal; Notable for the following components:   TSH 8.449 (*)    All other components within normal limits  CSF CULTURE W GRAM STAIN  MAGNESIUM   VITAMIN B12  RAPID HIV SCREEN (HIV 1/2 AB+AG)  HEMOGLOBIN A1C  CSF CELL COUNT WITH DIFFERENTIAL  CSF CELL COUNT WITH DIFFERENTIAL  PROTEIN AND GLUCOSE, CSF  LYME DISEASE SEROLOGY W/REFLEX  FOLATE  VITAMIN B1  MISC [facility] TEST (SEND OUT)    CT CERVICAL SPINE WO CONTRAST  Final Result    CT Head Wo Contrast  Final Result    CT Thoracic Spine Wo Contrast    (Results  Pending)    Medications  prochlorperazine (COMPAZINE) injection 10 mg (10 mg Intravenous Given 11/01/23 0111)  diphenhydrAMINE (BENADRYL) injection 25 mg (25 mg Intravenous Given 11/01/23 0111)  acetaminophen (TYLENOL) tablet 1,000 mg (1,000 mg Oral Given 11/01/23 0111)     Procedures  /  Critical Care Procedures  ED Course and Medical Decision Making  Initial Impression and Ddx 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  Sent here by neurology.  Last year underwent lumbar puncture and plasma exchange.  Patient explains that she could not receive IVIG because of her history of ITP.  Will consult neurology for recommendations.  Past medical/surgical history that increases complexity of ED encounter: History of Guillain-Barr  Interpretation of Diagnostics I personally reviewed the laboratory assessment and my interpretation is as follows: Thrombocytopenia otherwise no significant blood count or electrolyte disturbance.  TSH elevated    Patient Reassessment and Ultimate Disposition/Management     Neurology recommending admission for further evaluation including CT myelogram.  Can hold off on LP at this time.  Patient management required discussion with the following services or consulting groups:  Hospitalist Service and Neurology  Complexity of Problems Addressed Acute illness or injury that poses threat of life of bodily function  Additional Data Reviewed and Analyzed Further history obtained from: Prior labs/imaging results  Additional Factors Impacting ED Encounter  Risk Consideration of hospitalization  Elmer Sow. Pilar Plate, MD [facility] Emergency Medicine [facility] [email or web address]  Final Clinical Impressions(s) / ED Diagnoses     ICD-10-CM   1. Sensory disturbance  R20.9       ED Discharge Orders     None        Discharge Instructions Discussed with and Provided to Patient:   Discharge Instructions   None      Sabas Sous, MD 11/01/23 0230

## 2023-10-31 NOTE — ED Triage Notes (Signed)
Patient coming to ED for evaluation of numbness and pain to bilateral feet.  Reports symptoms started 2 weeks ago.  Hx of Guillain-Barre Syndrome.  Called neurologist and was informed that she needed to be evaluated.  States "the last time this happened it went all the way up to my waist."  + Difficulty with ambulation.

## 2023-10-31 NOTE — ED Notes (Signed)
Pt transported to CT ?

## 2023-11-01 ENCOUNTER — Observation Stay (HOSPITAL_COMMUNITY): Payer: 59

## 2023-11-01 ENCOUNTER — Encounter (HOSPITAL_COMMUNITY): Payer: Self-pay | Admitting: Family Medicine

## 2023-11-01 DIAGNOSIS — R202 Paresthesia of skin: Secondary | ICD-10-CM | POA: Diagnosis not present

## 2023-11-01 DIAGNOSIS — M4804 Spinal stenosis, thoracic region: Secondary | ICD-10-CM | POA: Diagnosis not present

## 2023-11-01 DIAGNOSIS — M50021 Cervical disc disorder at C4-C5 level with myelopathy: Secondary | ICD-10-CM | POA: Diagnosis not present

## 2023-11-01 DIAGNOSIS — R7989 Other specified abnormal findings of blood chemistry: Secondary | ICD-10-CM | POA: Diagnosis not present

## 2023-11-01 DIAGNOSIS — R200 Anesthesia of skin: Secondary | ICD-10-CM | POA: Diagnosis present

## 2023-11-01 DIAGNOSIS — D693 Immune thrombocytopenic purpura: Secondary | ICD-10-CM

## 2023-11-01 DIAGNOSIS — M4712 Other spondylosis with myelopathy, cervical region: Secondary | ICD-10-CM | POA: Diagnosis not present

## 2023-11-01 DIAGNOSIS — M4802 Spinal stenosis, cervical region: Secondary | ICD-10-CM | POA: Diagnosis not present

## 2023-11-01 LAB — CBC
HCT: 35.5 % — ABNORMAL LOW (ref 36.0–46.0)
Hemoglobin: 12 g/dL (ref 12.0–15.0)
MCH: 31.7 pg (ref 26.0–34.0)
MCHC: 33.8 g/dL (ref 30.0–36.0)
MCV: 93.9 fL (ref 80.0–100.0)
Platelets: 92 10*3/uL — ABNORMAL LOW (ref 150–400)
RBC: 3.78 MIL/uL — ABNORMAL LOW (ref 3.87–5.11)
RDW: 12.5 % (ref 11.5–15.5)
WBC: 4.4 10*3/uL (ref 4.0–10.5)
nRBC: 0 % (ref 0.0–0.2)

## 2023-11-01 LAB — RAPID HIV SCREEN (HIV 1/2 AB+AG)
HIV 1/2 Antibodies: NONREACTIVE
HIV-1 P24 Antigen - HIV24: NONREACTIVE

## 2023-11-01 LAB — CSF CELL COUNT WITH DIFFERENTIAL
RBC Count, CSF: 1 /mm3 — ABNORMAL HIGH
RBC Count, CSF: 0 /mm3
Tube #: 4
Tube #: 4
WBC, CSF: 3 /mm3 (ref 0–5)
WBC, CSF: 2 /mm3 (ref 0–5)

## 2023-11-01 LAB — BASIC METABOLIC PANEL
Anion gap: 6 (ref 5–15)
BUN: 14 mg/dL (ref 8–23)
CO2: 26 mmol/L (ref 22–32)
Calcium: 9.3 mg/dL (ref 8.9–10.3)
Chloride: 106 mmol/L (ref 98–111)
Creatinine, Ser: 0.77 mg/dL (ref 0.44–1.00)
GFR, Estimated: >60 mL/min (ref >60)
Glucose, Bld: 100 mg/dL — ABNORMAL HIGH (ref 70–99)
Potassium: 4.1 mmol/L (ref 3.5–5.1)
Sodium: 138 mmol/L (ref 135–145)

## 2023-11-01 LAB — T4, FREE: Free T4: 0.91 ng/dL (ref 0.61–1.12)

## 2023-11-01 LAB — HEMOGLOBIN A1C
Hgb A1c MFr Bld: 5.3 % (ref 4.8–5.6)
Mean Plasma Glucose: 105.41 mg/dL

## 2023-11-01 LAB — VITAMIN B12: Vitamin B-12: 212 pg/mL (ref 180–914)

## 2023-11-01 LAB — RPR: RPR Ser Ql: NONREACTIVE

## 2023-11-01 LAB — PROTEIN AND GLUCOSE, CSF
Glucose, CSF: 56 mg/dL (ref 40–70)
Total  Protein, CSF: 25 mg/dL (ref 15–45)

## 2023-11-01 LAB — TSH: TSH: 8.449 u[IU]/mL — ABNORMAL HIGH (ref 0.350–4.500)

## 2023-11-01 LAB — FOLATE: Folate: 11.4 ng/mL (ref >5.9)

## 2023-11-01 MED ORDER — ACETAMINOPHEN 325 MG PO TABS
650.0000 mg | ORAL_TABLET | Freq: Four times a day (QID) | ORAL | Status: DC | PRN
  Administered 2023-11-02 – 2023-11-03 (×3): 650 mg via ORAL
  Filled 2023-11-01 (×3): qty 2

## 2023-11-01 MED ORDER — DICYCLOMINE HCL 10 MG PO CAPS: 10.0000 mg | ORAL_CAPSULE | Freq: Three times a day (TID) | ORAL | Status: DC | PRN

## 2023-11-01 MED ORDER — FENTANYL CITRATE PF 50 MCG/ML IJ SOSY
12.5000 ug | PREFILLED_SYRINGE | INTRAMUSCULAR | Status: DC | PRN
  Administered 2023-11-01: 50 ug via INTRAVENOUS
  Filled 2023-11-01: qty 1

## 2023-11-01 MED ORDER — PANTOPRAZOLE SODIUM 40 MG PO TBEC
40.0000 mg | DELAYED_RELEASE_TABLET | Freq: Every day | ORAL | Status: DC
  Administered 2023-11-01 – 2023-11-04 (×4): 40 mg via ORAL
  Filled 2023-11-01 (×4): qty 1

## 2023-11-01 MED ORDER — OXYCODONE HCL 5 MG PO TABS: 5.0000 mg | ORAL_TABLET | ORAL | Status: DC | PRN

## 2023-11-01 MED ORDER — POLYETHYLENE GLYCOL 3350 17 G PO PACK
17.0000 g | PACK | Freq: Every day | ORAL | Status: DC
  Administered 2023-11-01 – 2023-11-04 (×4): 17 g via ORAL
  Filled 2023-11-01 (×4): qty 1

## 2023-11-01 MED ORDER — ROSUVASTATIN CALCIUM 5 MG PO TABS
5.0000 mg | ORAL_TABLET | ORAL | Status: DC
  Administered 2023-11-03: 5 mg via ORAL
  Filled 2023-11-01: qty 1

## 2023-11-01 MED ORDER — ONDANSETRON HCL 4 MG PO TABS: 4.0000 mg | ORAL_TABLET | Freq: Four times a day (QID) | ORAL | Status: DC | PRN

## 2023-11-01 MED ORDER — IOHEXOL 180 MG/ML  SOLN
10.0000 mL | Freq: Once | INTRAMUSCULAR | Status: AC | PRN
  Administered 2023-11-01: 10 mL via INTRATHECAL

## 2023-11-01 MED ORDER — ONDANSETRON HCL 4 MG/2ML IJ SOLN: 4.0000 mg | Freq: Four times a day (QID) | INTRAMUSCULAR | Status: DC | PRN

## 2023-11-01 MED ORDER — IOHEXOL 180 MG/ML  SOLN
5.0000 mL | Freq: Once | INTRAMUSCULAR | Status: AC | PRN
  Administered 2023-11-01: 5 mL via INTRATHECAL

## 2023-11-01 MED ORDER — ACETAMINOPHEN 650 MG RE SUPP: 650.0000 mg | Freq: Four times a day (QID) | RECTAL | Status: DC | PRN

## 2023-11-01 MED ORDER — SODIUM CHLORIDE 0.9% FLUSH
3.0000 mL | Freq: Two times a day (BID) | INTRAVENOUS | Status: DC
  Administered 2023-11-01 – 2023-11-04 (×7): 3 mL via INTRAVENOUS

## 2023-11-01 MED ORDER — CYANOCOBALAMIN 1000 MCG/ML IJ SOLN: 1000.0000 ug | Freq: Once | INTRAMUSCULAR | Status: DC

## 2023-11-01 NOTE — Plan of Care (Signed)

## 2023-11-01 NOTE — Progress Notes (Signed)
NEUROLOGY CONSULT FOLLOW UP NOTE   Date of service: November 01, 2023 Patient Name: Christine Brady MRN:  [id number] DOB:  [birth year]  Brief HPI  Christine Brady is a 62 y.o. female who presents with a recurrent episode of foot paresthesia. She initially presented to neurology in February 2023 with 2 months of progressive paresthesia and weakness.  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  She had an LP done which demonstrated 18 WBC, protein of 109. 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  She presents again with 3 weeks of difficulty getting up from couch, climbing stairs and 2 weeks of tingling/paresthesias in the bottom of her L foot and about 10 days of tingling and paresthesias in the bottom of her R foot.   Interval Hx/subjective   She is waiting for CT myelogram and LP today.  Vitals   Vitals:   11/01/23 0745 11/01/23 0800 11/01/23 0804 11/01/23 0815  BP: 116/71 107/62  110/66  Pulse: (!) 49 (!) 49  (!) 59  Resp: 11 12  10   Temp:   97.8 F (36.6 C)   TempSrc:   Oral   SpO2: 98% 98%  97%  Weight:      Height:         Body mass index is 25.24 kg/m.  Physical Exam   General: Laying comfortably in bed; in no acute distress.  HENT: Normal oropharynx and mucosa. Normal external appearance of ears and nose.  Neck: Supple, no pain or tenderness  CV: No JVD. No peripheral edema.  Pulmonary: Symmetric Chest rise. Normal respiratory effort.  Abdomen: Soft to touch, non-tender.  Ext: No cyanosis, edema, or deformity  Skin: No rash. Normal palpation of skin.   Musculoskeletal: Normal digits and nails by inspection. No clubbing.   Neurologic Examination  Mental status/Cognition: Alert, oriented to self, place, month and year, good attention.  Speech/language: Fluent, comprehension intact, object naming intact,  repetition intact.  Cranial nerves:   CN II Pupils equal and reactive to light, no VF deficits    CN III,IV,VI EOM intact, no gaze preference or deviation, no nystagmus    CN V normal sensation in V1, V2, and V3 segments bilaterally    CN VII no asymmetry, no nasolabial fold flattening    CN VIII normal hearing to speech    CN IX & X normal palatal elevation, no uvular deviation    CN XI 5/5 head turn and 5/5 shoulder shrug bilaterally    CN XII midline tongue protrusion    Motor:  Muscle bulk: normal, tone slightly increased in BL lower ext, pronator drift none tremor none Mvmt Root Nerve  Muscle Right Left Comments  SA C5/6 Ax Deltoid 5 5   EF C5/6 Mc Biceps 5 5   EE C6/7/8 Rad Triceps 5 5   WF C6/7 Med FCR     WE C7/8 PIN ECU     F Ab C8/T1 U ADM/FDI 5 5   HF L1/2/3 Fem Illopsoas 4+ 4+   KE L2/3/4 Fem Quad 4+ 4+   DF L4/5 D Peron Tib Ant 5 5   PF S1/2 Tibial Grc/Sol 5 5    Sensation:  Light touch Paresthesias in bottom of BL feet.   Pin prick    Temperature    Vibration   Proprioception    Coordination/Complex Motor:  -  Finger to Nose intact BL - Heel to shin intact BL - Rapid alternating movement are normal - Gait: deferred.  Labs and Diagnostic Imaging   CBC:  Recent Labs  Lab 10/31/23 2121 11/01/23 0440  WBC 5.3 4.4  NEUTROABS 2.1  --   HGB 12.2 12.0  HCT 36.4 35.5*  MCV 94.8 93.9  PLT 106* 92*    Basic Metabolic Panel:  Lab Results  Component Value Date   NA 138 11/01/2023   K 4.1 11/01/2023   CO2 26 11/01/2023   GLUCOSE 100 (H) 11/01/2023   BUN 14 11/01/2023   CREATININE 0.77 11/01/2023   CALCIUM 9.3 11/01/2023   GFRNONAA >60 11/01/2023   GFRAA >60 07/12/2019   Lipid Panel: No results found for: "LDLCALC" HgbA1c:  Lab Results  Component Value Date   HGBA1C 5.3 11/01/2023   Urine Drug Screen: No results found for: "LABOPIA", "COCAINSCRNUR", "LABBENZ", "AMPHETMU", "THCU", "LABBARB"  Alcohol Level No results found for: "ETH" INR  Lab  Results  Component Value Date   INR 0.9 12/09/2021   APTT  Lab Results  Component Value Date   APTT 26 12/09/2021   AED levels: No results found for: "PHENYTOIN", "ZONISAMIDE", "LAMOTRIGINE", "LEVETIRACETA"  CT Head without contrast(Personally reviewed): 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   Unable to get MRI Brain and spine 2/2 cochlear implants.   CT myelogram pending.  Impression   who presents with a recurrent episode of foot paresthesia. She initially presented to neurology in February 2023 with 2 months of progressive paresthesia and weakness.  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  She had an LP done which demonstrated 18 WBC, protein of 109. 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  She presents again with 3 weeks of difficulty getting up from couch, climbing stairs and 2 weeks of tingling/paresthesias in the bottom of her L foot and about 10 days of tingling and paresthesias in the bottom of her R foot.  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 Will get CT myelogram and LP to evaluate further and consider steroids vs PLEX  Recommendations  - CT myelogram - Serum and CSF CASPR2 antibodies - B1, B12, RPR - CSF for protein, glucose, cells - Consider pulse solumedrol or PLEX if CT myelogram is negative. ______________________________________________________________________   Christine Flakes, MD [facility]

## 2023-11-01 NOTE — Progress Notes (Signed)
Patient received from ED A/OX4 , ambulated to room x1 stand by assist , telemetry placed call light within reach, oriented to room , dinner ordered

## 2023-11-01 NOTE — H&P (Signed)
History and Physical    Christine Brady:[id number] DOB: Jun 15, 1961 DOA: 10/31/2023  PCP: Christine Ponto, MD   Patient coming from: Home   Chief Complaint: Burning discomfort and numbness in b/l feet   HPI: Christine Brady is a 62 y.o. 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  Patient was admitted and late February into March 2023 with bilateral lower extremity numbness.  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  She had done well until 2 weeks ago when she began developing burning sensation and numbness in her left foot.  It soon spread to involve the right foot as well.  Initially, she thought that it may be her shoes that were causing the symptoms, but symptoms have continued to progress despite changing her shoes.     Patient reports that the symptoms are very similar to what she was initially experiencing in February 2023, except that they progressed much more rapidly last year.  ED Course: Upon arrival to the ED, patient is found to be afebrile and saturating well on room air with stable heart rate and blood pressure.  Labs are most notable for normal renal function and electrolytes, normal WBC, normal B12, normal folate, nonreactive HIV, platelets 106,000, and TSH 8.449.  There are no acute findings on CT head or CT cervical spine.  Neurology was consulted by the ED physician and recommended medical admission for further evaluation and management.  Review of Systems:  All other systems reviewed and apart from HPI, are negative.  Past Medical History:  Diagnosis Date   Abdominal pain    Arthritis    difficulty walking   Atypical chest pain 11/11/2016   Bowel obstruction (HCC)    Cholesteatoma of right ear     Chronic headaches    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HCC) 11/11/2016   Cyst    back   Hearing aid worn    History of ITP    Hyperlipidemia 11/11/2016   Large ovary 07/09/2019   PONV (postoperative nausea and vomiting)    Reflux     Past Surgical History:  Procedure Laterality Date   CHOLECYSTECTOMY  2010   ESOPHAGOGASTRODUODENOSCOPY (EGD) WITH PROPOFOL N/A 06/12/2019   Procedure: ESOPHAGOGASTRODUODENOSCOPY (EGD) WITH PROPOFOL;  Surgeon: Jeani Hawking, MD;  Location: [facility];  Service: Endoscopy;  Laterality: N/A;   EXTERNAL EAR SURGERY  2009/2010   EXTERNAL EAR SURGERY  2010   right ear   IR FLUORO GUIDE CV LINE RIGHT  02/10/2022   IR US GUIDE VASC ACCESS RIGHT  02/10/2022   KNEE ARTHROSCOPY     right   KNEE ARTHROSCOPY WITH MEDIAL PATELLAR FEMORAL LIGAMENT RECONSTRUCTION Right 10/05/2021   Procedure: right knee arthroscoy, debridement, lateral menisectomy, medial patellar femoral ligament reconstruction;  Surgeon: Cammy Copa, MD;  Location: [facility];  Service: Orthopedics;  Laterality: Right;   KNEE LIGAMENT RECONSTRUCTION     left   MASTOIDECTOMY Right 01/19/2017   Procedure: RIGHT MODIFIED RADICAL MASTOIDECTOMY;  Surgeon: Ermalinda Barrios, MD;  Location: [facility];  Service: ENT;  Laterality: Right;   UPPER ESOPHAGEAL ENDOSCOPIC ULTRASOUND (EUS) N/A 06/12/2019   Procedure: UPPER ESOPHAGEAL ENDOSCOPIC ULTRASOUND (EUS);  Surgeon: Jeani Hawking, MD;  Location: [facility];  Service: Endoscopy;  Laterality:  N/A;   WISDOM TOOTH EXTRACTION      Social History:   reports that she has never smoked. She has never used smokeless tobacco. She reports current alcohol use. She reports that she does not use drugs.  Allergies  Allergen Reactions   Nsaids Other (See Comments)    The patient was told to avoid NSAID(s) at this time- is taking Eliquis   Oxycodone Other (See Comments)    "Skin crawling"   Influenza Vaccines Other (See  Comments)    Gullian-Barre syndrome   Shingrix [Zoster Vac Recomb Adjuvanted] Other (See Comments)    Potentially cause of Guillain-Barre syndrome   Claritin-D [Loratadine-Pseudoephedrine Er] Rash   Codeine Rash   Hydrocodone-Acetaminophen Itching   Loratadine Rash   Lyrica [Pregabalin] Other (See Comments)    Caused the patient to feel "foggy-headed"    Family History  Problem Relation Age of Onset   Diabetes Mother    Asthma Mother    Diabetes type II Mother    Alcohol abuse Mother    Hypertension Mother    Diabetes Father    Hypertension Father    Kidney disease Father    Diabetes type II Father    Alcohol abuse Father    Crohn's disease Sister    COPD Sister    Thrombocytopenia Sister    Cerebral palsy Brother    Neuropathy Neg Hx      Prior to Admission medications   Medication Sig Start Date End Date Taking? Authorizing Provider  Calcium Carbonate-Vitamin D3 600-400 MG-UNIT TABS Take 2 tablets by mouth daily.   Yes [provider]  dicyclomine (BENTYL) 10 MG capsule Take 1 capsule (10 mg total) by mouth every 8 (eight) hours as needed for spasms. Call for appointment please. 10/10/23  Yes Nandigam, Eleonore Chiquito, MD  omeprazole (PRILOSEC) 40 MG capsule Take 1 capsule (40 mg total) by mouth daily. 04/26/23  Yes   polyethylene glycol powder (GLYCOLAX/MIRALAX) 17 GM/SCOOP powder Take 17 g by mouth daily.   Yes [provider]  rosuvastatin (CRESTOR) 5 MG tablet Take 1 tablet (5 mg total) by mouth 2 (two) times a week. 04/28/23  Yes     Physical Exam: Vitals:   11/01/23 0215 11/01/23 0230 11/01/23 0300 11/01/23 0330  BP: 132/77 113/66 133/71 102/67  Pulse: (!) 54 (!) 57 60 (!) 54  Resp:  13 15 11   Temp:  97.7 F (36.5 C)    TempSrc:  Oral    SpO2: 96% 95% 98% 96%  Weight:      Height:        Constitutional: NAD, no pallor or diaphoresis   Eyes: PERTLA, lids and conjunctivae normal ENMT: Mucous membranes are moist. Posterior pharynx clear of any  exudate or lesions.   Neck: supple, no masses  Respiratory: no wheezing, no crackles. No accessory muscle use.  Cardiovascular: S1 & S2 heard, regular rate and rhythm. No JVD. Abdomen: No distension, no tenderness, soft. Bowel sounds active.  Musculoskeletal: no clubbing / cyanosis. No joint deformity upper and lower extremities.   Skin: no significant rashes, lesions, ulcers. Warm, dry, well-perfused. Neurologic: CN 2-12 grossly intact. Sensation to light touch diminished in b/l feet. Strength 5/5 in all 4 limbs. Alert and oriented.  Psychiatric: Calm. Cooperative.    Labs and Imaging on Admission: I have personally reviewed following labs and imaging studies  CBC: Recent Labs  Lab 10/31/23 2121  WBC 5.3  NEUTROABS 2.1  HGB 12.2  HCT 36.4  MCV 94.8  PLT 106*   Basic Metabolic Panel: Recent Labs  Lab 10/31/23 2121  NA 136  K 4.0  CL 105  CO2 23  GLUCOSE 98  BUN 19  CREATININE 0.74  CALCIUM 9.5  MG 2.1   GFR: Estimated Creatinine Clearance: 64.2 mL/min (by C-G formula based on SCr of 0.74 mg/dL). Liver Function Tests: Recent Labs  Lab 10/31/23 2121  AST 17  ALT 12  ALKPHOS 35*  BILITOT 0.9  PROT 6.7  ALBUMIN 4.1   No results for input(s): "LIPASE", "AMYLASE" in the last 168 hours. No results for input(s): "AMMONIA" in the last 168 hours. Coagulation Profile: No results for input(s): "INR", "PROTIME" in the last 168 hours. Cardiac Enzymes: No results for input(s): "CKTOTAL", "CKMB", "CKMBINDEX", "TROPONINI" in the last 168 hours. BNP (last 3 results) No results for input(s): "PROBNP" in the last 8760 hours. HbA1C: Recent Labs    11/01/23 0040  HGBA1C 5.3   CBG: No results for input(s): "GLUCAP" in the last 168 hours. Lipid Profile: No results for input(s): "CHOL", "HDL", "LDLCALC", "TRIG", "CHOLHDL", "LDLDIRECT" in the last 72 hours. Thyroid Function Tests: Recent Labs    11/01/23 0040  TSH 8.449*   Anemia Panel: Recent Labs    11/01/23 0040   VITAMINB12 212  FOLATE 11.4   Urine analysis:    Component Value Date/Time   COLORURINE YELLOW 02/19/2022 0545   APPEARANCEUR HAZY (A) 02/19/2022 0545   LABSPEC 1.027 02/19/2022 0545   PHURINE 5.0 02/19/2022 0545   GLUCOSEU NEGATIVE 02/19/2022 0545   HGBUR NEGATIVE 02/19/2022 0545   BILIRUBINUR NEGATIVE 02/19/2022 0545   KETONESUR NEGATIVE 02/19/2022 0545   PROTEINUR NEGATIVE 02/19/2022 0545   UROBILINOGEN 1.0 02/18/2009 2207   NITRITE POSITIVE (A) 02/19/2022 0545   LEUKOCYTESUR MODERATE (A) 02/19/2022 0545   Sepsis Labs: @LABRCNTIP (procalcitonin:4,lacticidven:4) )No results found for this or any previous visit (from the past 240 hour(s)).   Radiological Exams on Admission: CT CERVICAL SPINE WO CONTRAST  Result Date: 11/01/2023 CLINICAL DATA:  Numbness, pain and feet EXAM: CT CERVICAL SPINE WITHOUT CONTRAST TECHNIQUE: Multidetector CT imaging of the cervical spine was performed without intravenous contrast. Multiplanar CT image reconstructions were also generated. RADIATION DOSE REDUCTION: This exam was performed according to the departmental dose-optimization program which includes automated exposure control, adjustment of the mA and/or kV according to patient size and/or use of iterative reconstruction technique. COMPARISON:  None Available. FINDINGS: Alignment: Normal Skull base and vertebrae: No acute fracture. No primary bone lesion or focal pathologic process. Soft tissues and spinal canal: No prevertebral fluid or swelling. No visible canal hematoma. Disc levels: Mild disc space narrowing and spurring in the lower cervical spine. No central spinal stenosis or neuroforaminal narrowing. Upper chest: No acute findings Other: None IMPRESSION: No acute bony abnormality. Electronically Signed   By: Charlett Nose M.D.   On: 11/01/2023 00:13   CT Head Wo Contrast  Result Date: 11/01/2023 CLINICAL DATA:  Neuro deficit, acute, stroke suspected. Numbness and pain in bilateral feet. EXAM:  CT HEAD WITHOUT CONTRAST TECHNIQUE: Contiguous axial images were obtained from the base of the skull through the vertex without intravenous contrast. RADIATION DOSE REDUCTION: This exam was performed according to the departmental dose-optimization program which includes automated exposure control, adjustment of the mA and/or kV according to patient size and/or use of iterative reconstruction technique. 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 No visible acute intracranial abnormality. Specifically, no hemorrhage, hydrocephalus, mass lesion, acute infarction, or significant  intracranial injury. Vascular: No hyperdense vessel or unexpected calcification. Skull: No acute calvarial abnormality. Sinuses/Orbits: No acute findings Other: None. IMPRESSION: Within the limitations of the study with the artifact from the cochlear implant, no acute intracranial abnormality. Electronically Signed   By: Charlett Nose M.D.   On: 11/01/2023 00:12     Assessment/Plan   1. Bilateral foot numbness; hx of AIDP vs CIDP  - No acute findings on non-contrast CT head and cervical spine in ED, B12 and folate are normal   - Thiamine, RPR, CSF studies, and CT myelogram ordered by neurology   2. Chronic ITP  - She has persistent mild thrombocytopenia followed by Dr. Truett Perna   - Appears stable, no bleeding    3. Elevated TSH  - Check free T4     DVT prophylaxis: SCDs  Code Status: Full  Level of Care: Level of care: Telemetry Medical Family Communication: none present  Disposition Plan:  Patient is from: home  Anticipated d/c is to: TBD Anticipated d/c date is: TBD Patient currently: Pending neurology recommendations  Consults called: Neurology  Admission status: Observation     Briscoe Deutscher, MD [facility]  11/01/2023, 4:16 AM

## 2023-11-01 NOTE — Progress Notes (Signed)
No charge note   PROGRESS NOTE  Christine Brady JXB:[id number] DOB: 1961/04/15 DOA: 10/31/2023 PCP: Marylen Ponto, MD   LOS: 0 days   Brief Narrative / Interim history: 62 year old female with chronic ITP, IBS, HLD, prior DVT no longer on anticoagulation, history of GBS versus AIDP versus CIDP last year who comes into the hospital with bilateral numbness in her feet.  This is similar to her prior presentation, and she responded well to Plex.  Neurology consulted and we are asked to admit  Subjective / 24h Interval events: She is doing well this morning, complains of decreased sensation especially in her soles and on the bottom of her feet.  Denies any weakness per se and she is able to ambulate.  Does feel at times that the right leg is weaker than the left  Assesement and Plan: Principal Problem:   Numbness and tingling of both feet Active Problems:   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HCC)   Elevated TSH  Principal problem Bilateral foot numbness -current diagnosis still unclear, has a history of acute neuropathic process last year, improved with plasma exchange.  Neurology consulted, appreciate input, LP and myelogram pending  Active problems Chronic ITP-platelets 92  Elevated TSH-Free T4 normal.  Recheck TSH in 2 to 3 weeks  Low B12-borderline, 212, will replace  Scheduled Meds:  pantoprazole  40 mg Oral Daily   polyethylene glycol  17 g Oral Daily   [START ON 11/03/2023] rosuvastatin  5 mg Oral Once per day on Monday Thursday   sodium chloride flush  3 mL Intravenous Q12H   Continuous Infusions: PRN Meds:.acetaminophen **OR** acetaminophen, dicyclomine, fentaNYL (SUBLIMAZE) injection, ondansetron **OR** ondansetron (ZOFRAN) IV  Current Outpatient Medications  Medication Instructions   Calcium Carbonate-Vitamin D3 600-400 MG-UNIT TABS 2 tablets, Oral, Daily   dicyclomine (BENTYL) 10 mg, Oral, Every 8 hours PRN, Call for appointment please.   omeprazole  (PRILOSEC) 40 mg, Oral, Daily   polyethylene glycol powder (GLYCOLAX/MIRALAX) 17 g, Oral, Daily   rosuvastatin (CRESTOR) 5 mg, Oral, 2 times weekly    Diet Orders (From admission, onward)     Start     Ordered   11/01/23 0415  Diet NPO time specified Except for: Ice Chips, Sips with Meds  Diet effective now       Question Answer Comment  Except for Ice Chips   Except for Sips with Meds      11/01/23 0416            DVT prophylaxis: SCDs Start: 11/01/23 0414   Lab Results  Component Value Date   PLT 92 (L) 11/01/2023      Code Status: Full Code  Family Communication: no family at bedside   Status is: Observation The patient will require care spanning > 2 midnights and should be moved to inpatient because: severity of illness  Level of care: Telemetry Medical  Consultants:  Neurology   Objective: Vitals:   11/01/23 0745 11/01/23 0800 11/01/23 0804 11/01/23 0815  BP: 116/71 107/62  110/66  Pulse: (!) 49 (!) 49  (!) 59  Resp: 11 12  10   Temp:   97.8 F (36.6 C)   TempSrc:   Oral   SpO2: 98% 98%  97%  Weight:      Height:       No intake or output data in the 24 hours ending 11/01/23 1039 Wt Readings from Last 3 Encounters:  10/31/23 62.6 kg  03/31/23 64.6 kg  09/27/22 64.3 kg  Examination:  Constitutional: NAD Eyes: no scleral icterus ENMT: Mucous membranes are moist.  Neck: normal, supple Respiratory: clear to auscultation bilaterally, no wheezing, no crackles. Normal respiratory effort. Cardiovascular: Regular rate and rhythm, no murmurs / rubs / gallops. No LE edema.  Abdomen: non distended, no tenderness. Bowel sounds positive.  Musculoskeletal: no clubbing / cyanosis.   Data Reviewed: I have independently reviewed following labs and imaging studies   CBC Recent Labs  Lab 10/31/23 2121 11/01/23 0440  WBC 5.3 4.4  HGB 12.2 12.0  HCT 36.4 35.5*  PLT 106* 92*  MCV 94.8 93.9  MCH 31.8 31.7  MCHC 33.5 33.8  RDW 12.7 12.5  LYMPHSABS  2.7  --   MONOABS 0.4  --   EOSABS 0.1  --   BASOSABS 0.1  --     Recent Labs  Lab 10/31/23 2121 11/01/23 0040 11/01/23 0440  NA 136  --  138  K 4.0  --  4.1  CL 105  --  106  CO2 23  --  26  GLUCOSE 98  --  100*  BUN 19  --  14  CREATININE 0.74  --  0.77  CALCIUM 9.5  --  9.3  AST 17  --   --   ALT 12  --   --   ALKPHOS 35*  --   --   BILITOT 0.9  --   --   ALBUMIN 4.1  --   --   MG 2.1  --   --   TSH  --  8.449*  --   HGBA1C  --  5.3  --     ------------------------------------------------------------------------------------------------------------------ No results for input(s): "CHOL", "HDL", "LDLCALC", "TRIG", "CHOLHDL", "LDLDIRECT" in the last 72 hours.  Lab Results  Component Value Date   HGBA1C 5.3 11/01/2023   ------------------------------------------------------------------------------------------------------------------ Recent Labs    11/01/23 0040  TSH 8.449*    Cardiac Enzymes No results for input(s): "CKMB", "TROPONINI", "MYOGLOBIN" in the last 168 hours.  Invalid input(s): "CK" ------------------------------------------------------------------------------------------------------------------ No results found for: "BNP"  CBG: No results for input(s): "GLUCAP" in the last 168 hours.  No results found for this or any previous visit (from the past 240 hour(s)).   Radiology Studies: CT CERVICAL SPINE WO CONTRAST  Result Date: 11/01/2023 CLINICAL DATA:  Numbness, pain and feet EXAM: CT CERVICAL SPINE WITHOUT CONTRAST TECHNIQUE: Multidetector CT imaging of the cervical spine was performed without intravenous contrast. Multiplanar CT image reconstructions were also generated. RADIATION DOSE REDUCTION: This exam was performed according to the departmental dose-optimization program which includes automated exposure control, adjustment of the mA and/or kV according to patient size and/or use of iterative reconstruction technique. COMPARISON:  None  Available. FINDINGS: Alignment: Normal Skull base and vertebrae: No acute fracture. No primary bone lesion or focal pathologic process. Soft tissues and spinal canal: No prevertebral fluid or swelling. No visible canal hematoma. Disc levels: Mild disc space narrowing and spurring in the lower cervical spine. No central spinal stenosis or neuroforaminal narrowing. Upper chest: No acute findings Other: None IMPRESSION: No acute bony abnormality. Electronically Signed   By: Charlett Nose M.D.   On: 11/01/2023 00:13   CT Head Wo Contrast  Result Date: 11/01/2023 CLINICAL DATA:  Neuro deficit, acute, stroke suspected. Numbness and pain in bilateral feet. EXAM: CT HEAD WITHOUT CONTRAST TECHNIQUE: Contiguous axial images were obtained from the base of the skull through the vertex without intravenous contrast. RADIATION DOSE REDUCTION: This exam was performed according to the departmental dose-optimization  program which includes automated exposure control, adjustment of the mA and/or kV according to patient size and/or use of iterative reconstruction technique. 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 No visible acute intracranial abnormality. Specifically, no hemorrhage, hydrocephalus, mass lesion, acute infarction, or significant intracranial injury. Vascular: No hyperdense vessel or unexpected calcification. Skull: No acute calvarial abnormality. Sinuses/Orbits: No acute findings Other: None. IMPRESSION: Within the limitations of the study with the artifact from the cochlear implant, no acute intracranial abnormality. Electronically Signed   By: Charlett Nose M.D.   On: 11/01/2023 00:12     Pamella Pert, MD, PhD [facility]  Between 7 am - 7 pm I am available, please contact me via Amion (for emergencies) or Securechat (non urgent messages)  Between 7 pm - 7 am I am not available, please contact night coverage MD/APP via Amion

## 2023-11-01 NOTE — ED Notes (Signed)
ED TO INPATIENT HANDOFF REPORT  ED Nurse Name and Phone #: Victorino Dike [phone]  S Name/Age/Gender Christine Brady 62 y.o. female Room/Bed: 036C/036C  Code Status   Code Status: Full Code  Home/SNF/Other Home Patient oriented to: self, place, time, and situation Is this baseline? Yes   Triage Complete: Triage complete  Chief Complaint Numbness and tingling of both feet [R20.0, R20.2]  Triage Note Patient coming to ED for evaluation of numbness and pain to bilateral feet.  Reports symptoms started 2 weeks ago.  Hx of Guillain-Barre Syndrome.  Called neurologist and was informed that she needed to be evaluated.  States "the last time this happened it went all the way up to my waist."  + Difficulty with ambulation.    Allergies Allergies  Allergen Reactions   Nsaids Other (See Comments)    The patient was told to avoid NSAID(s) at this time- is taking Eliquis   Oxycodone Other (See Comments)    "Skin crawling"   Influenza Vaccines Other (See Comments)    Gullian-Barre syndrome   Shingrix [Zoster Vac Recomb Adjuvanted] Other (See Comments)    Potentially cause of Guillain-Barre syndrome   Claritin-D [Loratadine-Pseudoephedrine Er] Rash   Codeine Rash   Hydrocodone-Acetaminophen Itching   Loratadine Rash   Lyrica [Pregabalin] Other (See Comments)    Caused the patient to feel "foggy-headed"    Level of Care/Admitting Diagnosis ED Disposition     ED Disposition  Admit   Condition  --   Comment  Hospital Area: [facility] [[id number]]  Level of Care: Telemetry Medical [104]  May place patient in observation at [facility] or [facility] if equivalent level of care is available:: No  Covid Evaluation: Asymptomatic - no recent exposure (last 10 days) testing not required  Diagnosis: Numbness and tingling of both feet [[id number]]  Admitting Physician: Briscoe Deutscher [[id number]]  Attending Physician: Briscoe Deutscher [[id number]]          B Medical/Surgery  History Past Medical History:  Diagnosis Date   Abdominal pain    Arthritis    difficulty walking   Atypical chest pain 11/11/2016   Bowel obstruction (HCC)    Cholesteatoma of right ear    Chronic headaches    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HCC) 11/11/2016   Cyst    back   Hearing aid worn    History of ITP    Hyperlipidemia 11/11/2016   Large ovary 07/09/2019   PONV (postoperative nausea and vomiting)    Reflux    Past Surgical History:  Procedure Laterality Date   CHOLECYSTECTOMY  2010   ESOPHAGOGASTRODUODENOSCOPY (EGD) WITH PROPOFOL N/A 06/12/2019   Procedure: ESOPHAGOGASTRODUODENOSCOPY (EGD) WITH PROPOFOL;  Surgeon: Jeani Hawking, MD;  Location: [facility];  Service: Endoscopy;  Laterality: N/A;   EXTERNAL EAR SURGERY  2009/2010   EXTERNAL EAR SURGERY  2010   right ear   IR FLUORO GUIDE CV LINE RIGHT  02/10/2022   IR US GUIDE VASC ACCESS RIGHT  02/10/2022   KNEE ARTHROSCOPY     right   KNEE ARTHROSCOPY WITH MEDIAL PATELLAR FEMORAL LIGAMENT RECONSTRUCTION Right 10/05/2021   Procedure: right knee arthroscoy, debridement, lateral menisectomy, medial patellar femoral ligament reconstruction;  Surgeon: Cammy Copa, MD;  Location: [facility];  Service: Orthopedics;  Laterality: Right;   KNEE LIGAMENT RECONSTRUCTION     left   MASTOIDECTOMY Right 01/19/2017   Procedure: RIGHT MODIFIED RADICAL MASTOIDECTOMY;  Surgeon: Ermalinda Barrios, MD;  Location:  SURGERY  CENTER;  Service: ENT;  Laterality: Right;   UPPER ESOPHAGEAL ENDOSCOPIC ULTRASOUND (EUS) N/A 06/12/2019   Procedure: UPPER ESOPHAGEAL ENDOSCOPIC ULTRASOUND (EUS);  Surgeon: Jeani Hawking, MD;  Location: [facility];  Service: Endoscopy;  Laterality: N/A;   WISDOM TOOTH EXTRACTION       A IV Location/Drains/Wounds Patient Lines/Drains/Airways Status     Active Line/Drains/Airways     Name Placement date Placement time Site Days   Peripheral IV 11/01/23 20 G Anterior;Proximal;Right  Forearm 11/01/23  0049  Forearm  less than 1   Myringotomy Tube 01/19/17  1048  Right Ear  2477            Intake/Output Last 24 hours No intake or output data in the 24 hours ending 11/01/23 1524  Labs/Imaging Results for orders placed or performed during the hospital encounter of 10/31/23 (from the past 48 hour(s))  CBC with Differential     Status: Abnormal   Collection Time: 10/31/23  9:21 PM  Result Value Ref Range   WBC 5.3 4.0 - 10.5 K/uL   RBC 3.84 (L) 3.87 - 5.11 MIL/uL   Hemoglobin 12.2 12.0 - 15.0 g/dL   HCT 30.8 65.7 - 84.6 %   MCV 94.8 80.0 - 100.0 fL   MCH 31.8 26.0 - 34.0 pg   MCHC 33.5 30.0 - 36.0 g/dL   RDW 96.2 95.2 - 84.1 %   Platelets 106 (L) 150 - 400 K/uL    Comment: REPEATED TO VERIFY   nRBC 0.0 0.0 - 0.2 %   Neutrophils Relative % 39 %   Neutro Abs 2.1 1.7 - 7.7 K/uL   Lymphocytes Relative 51 %   Lymphs Abs 2.7 0.7 - 4.0 K/uL   Monocytes Relative 7 %   Monocytes Absolute 0.4 0.1 - 1.0 K/uL   Eosinophils Relative 2 %   Eosinophils Absolute 0.1 0.0 - 0.5 K/uL   Basophils Relative 1 %   Basophils Absolute 0.1 0.0 - 0.1 K/uL   Immature Granulocytes 0 %   Abs Immature Granulocytes 0.01 0.00 - 0.07 K/uL    Comment: Performed at [facility], [address], [place], [place] [address]  Comprehensive metabolic panel     Status: Abnormal   Collection Time: 10/31/23  9:21 PM  Result Value Ref Range   Sodium 136 135 - 145 mmol/L   Potassium 4.0 3.5 - 5.1 mmol/L   Chloride 105 98 - 111 mmol/L   CO2 23 22 - 32 mmol/L   Glucose, Bld 98 70 - 99 mg/dL    Comment: Glucose reference range applies only to samples taken after fasting for at least 8 hours.   BUN 19 8 - 23 mg/dL   Creatinine, Ser 1.02 0.44 - 1.00 mg/dL   Calcium 9.5 8.9 - 72.5 mg/dL   Total Protein 6.7 6.5 - 8.1 g/dL   Albumin 4.1 3.5 - 5.0 g/dL   AST 17 15 - 41 U/L   ALT 12 0 - 44 U/L   Alkaline Phosphatase 35 (L) 38 - 126 U/L   Total Bilirubin 0.9 <1.2 mg/dL   GFR, Estimated >36  >64 mL/min    Comment: (NOTE) Calculated using the CKD-EPI Creatinine Equation (2021)    Anion gap 8 5 - 15    Comment: Performed at [facility], [address] [name], [place] [address]  Magnesium     Status: None   Collection Time: 10/31/23  9:21 PM  Result Value Ref Range   Magnesium 2.1  1.7 - 2.4 mg/dL    Comment: Performed at [facility], [address], [place], [place] [address]  CSF culture w Gram Stain     Status: None (Preliminary result)   Collection Time: 10/31/23 11:36 PM   Specimen: CSF; Cerebrospinal Fluid  Result Value Ref Range   Specimen Description CSF    Special Requests NONE    Gram Stain      WBC PRESENT, PREDOMINANTLY MONONUCLEAR NO ORGANISMS SEEN CYTOSPIN SMEAR Performed at [facility], [address] [place], [place] [address]    Culture PENDING    Report Status PENDING   Vitamin B12     Status: None   Collection Time: 11/01/23 12:40 AM  Result Value Ref Range   Vitamin B-12 212 180 - 914 pg/mL    Comment: (NOTE) This assay is not validated for testing neonatal or myeloproliferative syndrome specimens for Vitamin B12 levels. Performed at [facility], [address], [place], [place] [address]   TSH     Status: Abnormal   Collection Time: 11/01/23 12:40 AM  Result Value Ref Range   TSH 8.449 (H) 0.350 - 4.500 uIU/mL    Comment: Performed by a 3rd Generation assay with a functional sensitivity of <=0.01 uIU/mL. Performed at [facility], [address], [place], [place] [address]   Rapid HIV screen (HIV 1/2 Ab+Ag)     Status: None   Collection Time: 11/01/23 12:40 AM  Result Value Ref Range   HIV-1 P24 Antigen - HIV24 NON REACTIVE NON REACTIVE    Comment: (NOTE) Detection of p24 may be inhibited by biotin in the sample, causing false negative results in acute infection.    HIV 1/2 Antibodies NON REACTIVE NON REACTIVE   Interpretation (HIV Ag Ab)      A non reactive test result means that HIV 1 or HIV 2  antibodies and HIV 1 p24 antigen were not detected in the specimen.    Comment: Performed at [facility], [address] [place], [place] [address]  Hemoglobin A1c     Status: None   Collection Time: 11/01/23 12:40 AM  Result Value Ref Range   Hgb A1c MFr Bld 5.3 4.8 - 5.6 %    Comment: (NOTE) Pre diabetes:          5.7%-6.4%  Diabetes:              >6.4%  Glycemic control for   <7.0% adults with diabetes    Mean Plasma Glucose 105.41 mg/dL    Comment: Performed at [facility], [address] [place], [place] [address]  Folate     Status: None   Collection Time: 11/01/23 12:40 AM  Result Value Ref Range   Folate 11.4 >5.9 ng/mL    Comment: Performed at [facility], [address], [place], [place] [address]  RPR     Status: None   Collection Time: 11/01/23  4:40 AM  Result Value Ref Range   RPR Ser Ql NON REACTIVE NON REACTIVE    Comment: Performed at [facility], [address] [place], [place] [address]  Basic metabolic panel     Status: Abnormal   Collection Time: 11/01/23  4:40 AM  Result Value Ref Range   Sodium 138 135 - 145 mmol/L   Potassium 4.1 3.5 - 5.1 mmol/L   Chloride 106 98 - 111 mmol/L   CO2 26 22 - 32 mmol/L  Glucose, Bld 100 (H) 70 - 99 mg/dL    Comment: Glucose reference range applies only to samples taken after fasting for at least 8 hours.   BUN 14 8 - 23 mg/dL   Creatinine, Ser 8.29 0.44 - 1.00 mg/dL   Calcium 9.3 8.9 - 56.2 mg/dL   GFR, Estimated >13 >08 mL/min    Comment: (NOTE) Calculated using the CKD-EPI Creatinine Equation (2021)    Anion gap 6 5 - 15    Comment: Performed at [facility], [address], [place], [place] [address]  CBC     Status: Abnormal   Collection Time: 11/01/23  4:40 AM  Result Value Ref Range   WBC 4.4 4.0 - 10.5 K/uL   RBC 3.78 (L) 3.87 - 5.11 MIL/uL   Hemoglobin 12.0 12.0 - 15.0 g/dL   HCT 69.6 (L) 29.5 - 28.4 %   MCV 93.9 80.0 - 100.0 fL   MCH 31.7 26.0 - 34.0 pg   MCHC  33.8 30.0 - 36.0 g/dL   RDW 13.2 44.0 - 10.2 %   Platelets 92 (L) 150 - 400 K/uL    Comment: Immature Platelet Fraction may be clinically indicated, consider ordering this additional test [id number] REPEATED TO VERIFY    nRBC 0.0 0.0 - 0.2 %    Comment: Performed at [facility], [address] [place], [place] [address]  T4, free     Status: None   Collection Time: 11/01/23  4:40 AM  Result Value Ref Range   Free T4 0.91 0.61 - 1.12 ng/dL    Comment: (NOTE) Biotin ingestion may interfere with free T4 tests. If the results are inconsistent with the TSH level, previous test results, or the clinical presentation, then consider biotin interference. If needed, order repeat testing after stopping biotin. Performed at [facility], [address], [place], [place] [address]   Protein and glucose, CSF     Status: None   Collection Time: 11/01/23 12:27 PM  Result Value Ref Range   Glucose, CSF 56 40 - 70 mg/dL   Total  Protein, CSF 25 15 - 45 mg/dL    Comment: Performed at [facility], [address] [place], [place] [address]   CT Thoracic Spine Wo Contrast  Result Date: 11/01/2023 CLINICAL DATA:  This study identified as missing a report at 11:32 am on 11/01/2023. 62 year old female with numbness.  "Demyelinating disease". EXAM: CT THORACIC SPINE WITHOUT CONTRAST TECHNIQUE: Multidetector CT images of the thoracic were obtained using the standard protocol without intravenous contrast. RADIATION DOSE REDUCTION: This exam was performed according to the departmental dose-optimization program which includes automated exposure control, adjustment of the mA and/or kV according to patient size and/or use of iterative reconstruction technique. COMPARISON:  CT cervical spine from the same time reported separately. Previous CT Chest, Abdomen, and Pelvis 02/14/2022. FINDINGS: Limited cervical spine imaging:  Reported separately. Thoracic spine segmentation:  Normal. Alignment:  Stable from the chest CT last year. Mild reverse S-shaped thoracic scoliosis with mild straightening of thoracic kyphosis. Vertebrae: Maintained thoracic vertebral body height. Background bone mineralization within normal limits. No acute osseous abnormality identified. Paraspinal and other soft tissues: Stable lungs aside from symmetric mild dependent atelectasis. Visible major airways are patent. No evidence of pericardial or pleural effusion. No evidence of mediastinal mass or lymphadenopathy. Chronic cholecystectomy. Negative visible other noncontrast upper abdominal viscera. Negative thoracic paraspinal soft tissues. Disc levels: Mild for age thoracic spine degeneration despite mild scoliosis.  Chronic disc and endplate degeneration most pronounced match that chronic disc and endplate degeneration most pronounced at T10-T11 and T11-T12 appears stable by CT. No CT evidence of thoracic spinal stenosis. IMPRESSION: 1. No acute osseous abnormality in the Thoracic Spine. Mild chronic thoracic scoliosis. 2. Mild for age thoracic spine degeneration, maximal at T10-T11 and T11-T12. No CT evidence of thoracic spinal stenosis. 3. CT Cervical Spine reported separately. Electronically Signed   By: Odessa Fleming M.D.   On: 11/01/2023 11:38   CT CERVICAL SPINE WO CONTRAST  Result Date: 11/01/2023 CLINICAL DATA:  Numbness, pain and feet EXAM: CT CERVICAL SPINE WITHOUT CONTRAST TECHNIQUE: Multidetector CT imaging of the cervical spine was performed without intravenous contrast. Multiplanar CT image reconstructions were also generated. RADIATION DOSE REDUCTION: This exam was performed according to the departmental dose-optimization program which includes automated exposure control, adjustment of the mA and/or kV according to patient size and/or use of iterative reconstruction technique. COMPARISON:  None Available. FINDINGS: Alignment: Normal Skull base and vertebrae: No acute fracture. No primary bone lesion or focal pathologic  process. Soft tissues and spinal canal: No prevertebral fluid or swelling. No visible canal hematoma. Disc levels: Mild disc space narrowing and spurring in the lower cervical spine. No central spinal stenosis or neuroforaminal narrowing. Upper chest: No acute findings Other: None IMPRESSION: No acute bony abnormality. Electronically Signed   By: Charlett Nose M.D.   On: 11/01/2023 00:13   CT Head Wo Contrast  Result Date: 11/01/2023 CLINICAL DATA:  Neuro deficit, acute, stroke suspected. Numbness and pain in bilateral feet. EXAM: CT HEAD WITHOUT CONTRAST TECHNIQUE: Contiguous axial images were obtained from the base of the skull through the vertex without intravenous contrast. RADIATION DOSE REDUCTION: This exam was performed according to the departmental dose-optimization program which includes automated exposure control, adjustment of the mA and/or kV according to patient size and/or use of iterative reconstruction technique. 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 No visible acute intracranial abnormality. Specifically, no hemorrhage, hydrocephalus, mass lesion, acute infarction, or significant intracranial injury. Vascular: No hyperdense vessel or unexpected calcification. Skull: No acute calvarial abnormality. Sinuses/Orbits: No acute findings Other: None. IMPRESSION: Within the limitations of the study with the artifact from the cochlear implant, no acute intracranial abnormality. Electronically Signed   By: Charlett Nose M.D.   On: 11/01/2023 00:12    Pending Labs Unresulted Labs (From admission, onward)     Start     Ordered   11/02/23 0500  Comprehensive metabolic panel  Tomorrow morning,   R        11/01/23 1056   11/02/23 0500  CBC  Tomorrow morning,   R        11/01/23 1056   11/02/23 0500  Magnesium  Tomorrow morning,   R        11/01/23 1056   11/01/23 1259  CSF cell count with differential collection tube #: 4  (CSF Labs)   Once,   R       Question:  collection tube #  Answer:  4   11/01/23 1259   11/01/23 1226  CSF cell count with differential  Once,   R        11/01/23 1226   11/01/23 1121  Miscellaneous [facility] test (send-out)  Add-on,   AD       Question:  Test name / description:  Answer:  CASPR2 antibody   11/01/23 1120   11/01/23 0500  Basic metabolic  panel  Daily,   R      11/01/23 0416   11/01/23 0500  CBC  Daily,   R      11/01/23 0416   11/01/23 0228  Vitamin B1  Once,   URGENT        11/01/23 0227   10/31/23 2342  Lyme Disease Serology w/Reflex  Once,   URGENT        10/31/23 2342   10/31/23 2336  CSF cell count with differential collection tube #: 1  (CSF Labs)  Once,   STAT       Question Answer Comment  collection tube # 1   Are there also cytology or pathology orders on this specimen? No      10/31/23 2336   10/31/23 2336  Protein and glucose, CSF  (CSF Labs)  Once,   STAT        10/31/23 2336            Vitals/Pain Today's Vitals   11/01/23 1045 11/01/23 1100 11/01/23 1115 11/01/23 1511  BP: (!) 97/56 105/74  (!) 148/78  Pulse: (!) 56 68  62  Resp: 11 12  12   Temp:   97.8 F (36.6 C)   TempSrc:   Oral   SpO2: 96% 96%  100%  Weight:      Height:      PainSc:        Isolation Precautions No active isolations  Medications Medications  rosuvastatin (CRESTOR) tablet 5 mg (has no administration in time range)  dicyclomine (BENTYL) capsule 10 mg (has no administration in time range)  pantoprazole (PROTONIX) EC tablet 40 mg (40 mg Oral Given 11/01/23 1111)  polyethylene glycol (MIRALAX / GLYCOLAX) packet 17 g (17 g Oral Not Given 11/01/23 1235)  sodium chloride flush (NS) 0.9 % injection 3 mL (3 mLs Intravenous Given 11/01/23 1112)  acetaminophen (TYLENOL) tablet 650 mg (has no administration in time range)    Or  acetaminophen (TYLENOL) suppository 650 mg (has no administration in time range)  fentaNYL (SUBLIMAZE) injection 12.5-50 mcg (has no administration in time  range)  ondansetron (ZOFRAN) tablet 4 mg (has no administration in time range)    Or  ondansetron (ZOFRAN) injection 4 mg (has no administration in time range)  cyanocobalamin (VITAMIN B12) injection 1,000 mcg (1,000 mcg Intramuscular Not Given 11/01/23 1235)  prochlorperazine (COMPAZINE) injection 10 mg (10 mg Intravenous Given 11/01/23 0111)  diphenhydrAMINE (BENADRYL) injection 25 mg (25 mg Intravenous Given 11/01/23 0111)  acetaminophen (TYLENOL) tablet 1,000 mg (1,000 mg Oral Given 11/01/23 0111)  iohexol (OMNIPAQUE) 180 MG/ML injection 10 mL (10 mLs Intrathecal Contrast Given 11/01/23 1308)  iohexol (OMNIPAQUE) 180 MG/ML injection 5 mL (5 mLs Intrathecal Contrast Given 11/01/23 1307)    Mobility walks     Focused Assessments    R Recommendations: See Admitting Provider Note  Report given to:   Additional Notes:

## 2023-11-01 NOTE — Consult Note (Signed)
NEUROLOGY CONSULT NOTE   Date of service: November 01, 2023 Patient Name: Christine Brady MRN:  [id number] DOB:  1961/09/16 Chief Complaint: "Bilateral foot pain" Requesting Provider: Briscoe Deutscher, MD  History of Present Illness  Christine Brady is a 62 y.o. female who presents with a recurrent episode of foot paresthesia.  She initially presented to neurology in February 2023 with 2 months of progressive paresthesia and weakness.  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  She had an LP done which demonstrated 18 WBC, protein of 109.  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  She has been asymptomatic since, until the past few weeks where she has had recurrence of very similar symptoms.  She describes the symptoms as like a burning sensation over her foot and like her feet what to clinch.  She endorses muscle stiffness.  She denies bowel or bladder change.  Past History   Past Medical History:  Diagnosis Date   Abdominal pain    Arthritis    difficulty walking   Atypical chest pain 11/11/2016   Bowel obstruction (HCC)    Cholesteatoma of right ear    Chronic headaches    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HCC) 11/11/2016   Cyst    back   Hearing aid worn    History of ITP    Hyperlipidemia 11/11/2016   Large ovary 07/09/2019   PONV (postoperative nausea and vomiting)    Reflux     Past Surgical History:  Procedure Laterality Date   CHOLECYSTECTOMY  2010   ESOPHAGOGASTRODUODENOSCOPY (EGD) WITH PROPOFOL N/A 06/12/2019   Procedure: ESOPHAGOGASTRODUODENOSCOPY (EGD) WITH PROPOFOL;  Surgeon: Jeani Hawking, MD;  Location: [facility];  Service: Endoscopy;  Laterality: N/A;   EXTERNAL EAR SURGERY  2009/2010   EXTERNAL EAR SURGERY  2010   right ear   IR FLUORO GUIDE CV LINE RIGHT  02/10/2022   IR US GUIDE VASC ACCESS RIGHT  02/10/2022   KNEE  ARTHROSCOPY     right   KNEE ARTHROSCOPY WITH MEDIAL PATELLAR FEMORAL LIGAMENT RECONSTRUCTION Right 10/05/2021   Procedure: right knee arthroscoy, debridement, lateral menisectomy, medial patellar femoral ligament reconstruction;  Surgeon: Cammy Copa, MD;  Location: [facility];  Service: Orthopedics;  Laterality: Right;   KNEE LIGAMENT RECONSTRUCTION     left   MASTOIDECTOMY Right 01/19/2017   Procedure: RIGHT MODIFIED RADICAL MASTOIDECTOMY;  Surgeon: Ermalinda Barrios, MD;  Location:  SURGERY CENTER;  Service: ENT;  Laterality: Right;   UPPER ESOPHAGEAL ENDOSCOPIC ULTRASOUND (EUS) N/A 06/12/2019   Procedure: UPPER ESOPHAGEAL ENDOSCOPIC ULTRASOUND (EUS);  Surgeon: Jeani Hawking, MD;  Location: [facility];  Service: Endoscopy;  Laterality: N/A;   WISDOM TOOTH EXTRACTION      Family History: Family History  Problem Relation Age of Onset   Diabetes Mother    Asthma Mother    Diabetes type II Mother    Alcohol abuse Mother    Hypertension Mother    Diabetes Father    Hypertension Father    Kidney disease Father    Diabetes type II Father    Alcohol abuse Father    Crohn's disease Sister    COPD Sister    Thrombocytopenia Sister    Cerebral palsy Brother    Neuropathy Neg Hx     Social History  reports that she has never smoked. She has never used smokeless tobacco. She  reports current alcohol use. She reports that she does not use drugs.  Allergies  Allergen Reactions   Nsaids Other (See Comments)    The patient was told to avoid NSAID(s) at this time- is taking Eliquis   Oxycodone Other (See Comments)    "Skin crawling"   Influenza Vaccines Other (See Comments)    Gullian-Barre syndrome   Shingrix [Zoster Vac Recomb Adjuvanted] Other (See Comments)    Potentially cause of Guillain-Barre syndrome   Claritin-D [Loratadine-Pseudoephedrine Er] Rash   Codeine Rash   Hydrocodone-Acetaminophen Itching   Loratadine Rash   Lyrica [Pregabalin] Other (See  Comments)    Caused the patient to feel "foggy-headed"    Medications  No current facility-administered medications for this encounter.  Current Outpatient Medications:    Calcium Carbonate-Vitamin D3 600-400 MG-UNIT TABS, Take 2 tablets by mouth daily., Disp: , Rfl:    dicyclomine (BENTYL) 10 MG capsule, Take 1 capsule (10 mg total) by mouth every 8 (eight) hours as needed for spasms. Call for appointment please., Disp: 15 capsule, Rfl: 0   omeprazole (PRILOSEC) 40 MG capsule, Take 1 capsule (40 mg total) by mouth daily., Disp: 90 capsule, Rfl: 4   polyethylene glycol powder (GLYCOLAX/MIRALAX) 17 GM/SCOOP powder, Take 17 g by mouth daily., Disp: , Rfl:    rosuvastatin (CRESTOR) 5 MG tablet, Take 1 tablet (5 mg total) by mouth 2 (two) times a week., Disp: 36 tablet, Rfl: 4  Vitals   Vitals:   11/01/23 0029 11/01/23 0030 11/01/23 0215 11/01/23 0230  BP:  111/78 132/77 113/66  Pulse:  65 (!) 54 (!) 57  Resp:  18  13  Temp: 97.9 F (36.6 C)   97.7 F (36.5 C)  TempSrc:    Oral  SpO2:  99% 96% 95%  Weight:      Height:        Body mass index is 25.24 kg/m.  Physical Exam   Constitutional: Appears well-developed and well-nourished.  Neurologic Examination     Neuro: Mental Status: Patient is awake, alert, oriented to person, place, month, year, and situation. Patient is able to give a clear and coherent history. No signs of aphasia or neglect Cranial Nerves: II: Visual Fields are full. Pupils are equal, round, and reactive to light.   III,IV, VI: EOMI without ptosis or diploplia.  V: Facial sensation is symmetric to temperature VII: Facial movement is symmetric.  VIII: hearing is intact to voice X: Uvula elevates symmetrically XI: Shoulder shrug is symmetric. XII: tongue is midline without atrophy or fasciculations.  Motor: Tone is normal. Bulk is normal. 5/5 strength was present in bilateral arms, she has mild hip flexion weakness with preserved  Sensory: Intact to  bilateral upper extremities, she has diminished sensation to light touch below the knee on the right, the ankle on the left.  She has mildly diminished proprioception at the toes bilaterally, intact at the ankle. Deep Tendon Reflexes: 2+ and symmetric in the biceps, triceps, ankles.  She has 2+ at the right knee, there does appear to be mild cross adduction at the left knee. Plantars: Toes are downgoing bilaterally.  Cerebellar: FNF intact bilaterally        Labs/Imaging/Neurodiagnostic studies   CBC:  Recent Labs  Lab 2023-11-25 2121  WBC 5.3  NEUTROABS 2.1  HGB 12.2  HCT 36.4  MCV 94.8  PLT 106*   Basic Metabolic Panel:  Lab Results  Component Value Date   NA 136 11-25-2023   K 4.0 November 25, 2023  CO2 23 10/31/2023   GLUCOSE 98 10/31/2023   BUN 19 10/31/2023   CREATININE 0.74 10/31/2023   CALCIUM 9.5 10/31/2023   GFRNONAA >60 10/31/2023   GFRAA >60 07/12/2019   Lipid Panel: No results found for: "LDLCALC" HgbA1c:  Lab Results  Component Value Date   HGBA1C 5.3 11/01/2023   Urine Drug Screen: No results found for: "LABOPIA", "COCAINSCRNUR", "LABBENZ", "AMPHETMU", "THCU", "LABBARB"  Alcohol Level No results found for: "ETH" INR  Lab Results  Component Value Date   INR 0.9 12/09/2021   APTT  Lab Results  Component Value Date   APTT 26 12/09/2021   AED levels: No results found for: "PHENYTOIN", "ZONISAMIDE", "LAMOTRIGINE", "LEVETIRACETA"   ASSESSMENT   Christine Brady is a 62 y.o. female with past medical history of acute neuropathic process treated with plasma exchange with subsequent improvement.  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  With some degree of hyperreflexia, I do think ruling out compressive lesion would be prudent, and we could repeat CSF sampling at the same time.  If this is negative, then I would favor  further treatment.  She did respond to plasma change previously, but I think pulsed dose steroids would also be a potential treatment option without obligating yearly as long hospital stay.  IVIG would typically be a treatment option as well, but her with her history of DVT, the patient is very wary of anything with increased thrombosis risk.  RECOMMENDATIONS  CT myelogram Serum and CSF CASPR2 antibodies B1, B12 CSF for protein, glucose, cells Consider pulse solumedrol or PLEX if CT myelogram is negative.  ______________________________________________________________________    Stormy Card, MD [facility]

## 2023-11-02 DIAGNOSIS — Z886 Allergy status to analgesic agent status: Secondary | ICD-10-CM | POA: Diagnosis not present

## 2023-11-02 DIAGNOSIS — M1990 Unspecified osteoarthritis, unspecified site: Secondary | ICD-10-CM | POA: Diagnosis present

## 2023-11-02 DIAGNOSIS — G4486 Cervicogenic headache: Secondary | ICD-10-CM | POA: Diagnosis not present

## 2023-11-02 DIAGNOSIS — R202 Paresthesia of skin: Secondary | ICD-10-CM | POA: Diagnosis not present

## 2023-11-02 DIAGNOSIS — Z887 Allergy status to serum and vaccine status: Secondary | ICD-10-CM | POA: Diagnosis not present

## 2023-11-02 DIAGNOSIS — Z862 Personal history of diseases of the blood and blood-forming organs and certain disorders involving the immune mechanism: Secondary | ICD-10-CM | POA: Diagnosis not present

## 2023-11-02 DIAGNOSIS — Z9049 Acquired absence of other specified parts of digestive tract: Secondary | ICD-10-CM | POA: Diagnosis not present

## 2023-11-02 DIAGNOSIS — R200 Anesthesia of skin: Secondary | ICD-10-CM | POA: Diagnosis not present

## 2023-11-02 DIAGNOSIS — Z79899 Other long term (current) drug therapy: Secondary | ICD-10-CM | POA: Diagnosis not present

## 2023-11-02 DIAGNOSIS — R292 Abnormal reflex: Secondary | ICD-10-CM | POA: Diagnosis present

## 2023-11-02 DIAGNOSIS — G6181 Chronic inflammatory demyelinating polyneuritis: Secondary | ICD-10-CM | POA: Diagnosis not present

## 2023-11-02 DIAGNOSIS — E785 Hyperlipidemia, unspecified: Secondary | ICD-10-CM | POA: Diagnosis not present

## 2023-11-02 DIAGNOSIS — Z885 Allergy status to narcotic agent status: Secondary | ICD-10-CM | POA: Diagnosis not present

## 2023-11-02 DIAGNOSIS — R208 Other disturbances of skin sensation: Secondary | ICD-10-CM | POA: Diagnosis present

## 2023-11-02 DIAGNOSIS — K219 Gastro-esophageal reflux disease without esophagitis: Secondary | ICD-10-CM | POA: Diagnosis present

## 2023-11-02 DIAGNOSIS — D693 Immune thrombocytopenic purpura: Secondary | ICD-10-CM | POA: Diagnosis not present

## 2023-11-02 DIAGNOSIS — G629 Polyneuropathy, unspecified: Secondary | ICD-10-CM

## 2023-11-02 DIAGNOSIS — K589 Irritable bowel syndrome without diarrhea: Secondary | ICD-10-CM | POA: Diagnosis not present

## 2023-11-02 DIAGNOSIS — Z888 Allergy status to other drugs, medicaments and biological substances status: Secondary | ICD-10-CM | POA: Diagnosis not present

## 2023-11-02 DIAGNOSIS — R946 Abnormal results of thyroid function studies: Secondary | ICD-10-CM | POA: Diagnosis not present

## 2023-11-02 DIAGNOSIS — Z86718 Personal history of other venous thrombosis and embolism: Secondary | ICD-10-CM | POA: Diagnosis not present

## 2023-11-02 LAB — COMPREHENSIVE METABOLIC PANEL
ALT: 11 U/L (ref 0–44)
AST: 15 U/L (ref 15–41)
Albumin: 3.6 g/dL (ref 3.5–5.0)
Alkaline Phosphatase: 38 U/L (ref 38–126)
Anion gap: 7 (ref 5–15)
BUN: 10 mg/dL (ref 8–23)
CO2: 27 mmol/L (ref 22–32)
Calcium: 9.4 mg/dL (ref 8.9–10.3)
Chloride: 106 mmol/L (ref 98–111)
Creatinine, Ser: 0.66 mg/dL (ref 0.44–1.00)
GFR, Estimated: >60 mL/min (ref >60)
Glucose, Bld: 106 mg/dL — ABNORMAL HIGH (ref 70–99)
Potassium: 4.7 mmol/L (ref 3.5–5.1)
Sodium: 140 mmol/L (ref 135–145)
Total Bilirubin: 0.8 mg/dL (ref <1.2)
Total Protein: 6.2 g/dL — ABNORMAL LOW (ref 6.5–8.1)

## 2023-11-02 LAB — CBC
HCT: 38.9 % (ref 36.0–46.0)
Hemoglobin: 13.1 g/dL (ref 12.0–15.0)
MCH: 32 pg (ref 26.0–34.0)
MCHC: 33.7 g/dL (ref 30.0–36.0)
MCV: 94.9 fL (ref 80.0–100.0)
Platelets: 111 10*3/uL — ABNORMAL LOW (ref 150–400)
RBC: 4.1 MIL/uL (ref 3.87–5.11)
RDW: 12.6 % (ref 11.5–15.5)
WBC: 4 10*3/uL (ref 4.0–10.5)
nRBC: 0 % (ref 0.0–0.2)

## 2023-11-02 LAB — LYME DISEASE SEROLOGY W/REFLEX: Lyme Total Antibody EIA: NEGATIVE

## 2023-11-02 LAB — MAGNESIUM: Magnesium: 2 mg/dL (ref 1.7–2.4)

## 2023-11-02 MED ORDER — HYDROXYZINE HCL 10 MG PO TABS
10.0000 mg | ORAL_TABLET | Freq: Three times a day (TID) | ORAL | Status: DC | PRN
  Administered 2023-11-02 – 2023-11-03 (×3): 10 mg via ORAL
  Filled 2023-11-02 (×3): qty 1

## 2023-11-02 MED ORDER — SODIUM CHLORIDE 0.9 % IV SOLN
1000.0000 mg | INTRAVENOUS | Status: DC
  Administered 2023-11-02 – 2023-11-04 (×3): 1000 mg via INTRAVENOUS
  Filled 2023-11-02 (×4): qty 16

## 2023-11-02 MED ORDER — LIDOCAINE 4 % EX CREA
TOPICAL_CREAM | Freq: Three times a day (TID) | CUTANEOUS | Status: DC | PRN
Start: 2023-11-02 — End: 2023-11-04

## 2023-11-02 MED ORDER — HYDROCODONE-ACETAMINOPHEN 5-325 MG PO TABS
1.0000 | ORAL_TABLET | Freq: Four times a day (QID) | ORAL | Status: DC | PRN
  Administered 2023-11-02 – 2023-11-03 (×6): 1 via ORAL
  Filled 2023-11-02 (×6): qty 1

## 2023-11-02 NOTE — Progress Notes (Addendum)
Attending OK patient to have bed alarm off may ambulate in room , pateint is steady on her feet amd A/ox 4

## 2023-11-02 NOTE — Progress Notes (Addendum)
NEUROLOGY CONSULT FOLLOW UP NOTE   Date of service: November 02, 2023 Patient Name: RUTHANNE WOLFLEY MRN:  [id number] DOB:  05-Apr-1961  Brief HPI  Cletus R Brakefield is a 62 y.o. female who presents with a recurrent episode of foot paresthesia. She initially presented to neurology in February 2023 with 2 months of progressive paresthesia and weakness.  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  She had an LP done which demonstrated 18 WBC, protein of 109. 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  She presents again with 3 weeks of difficulty getting up from couch, climbing stairs and 2 weeks of tingling/paresthesias in the bottom of her L foot and about 10 days of tingling and paresthesias in the bottom of her R foot.   Interval Hx/subjective   CT myelogram with "Previously seen thickening and nodularity of the cauda equina nerve roots appears improved (particularly when viewed on reformatted imaging) with nerve roots mildly enlarged in size."  LP not concerning for CSF infection. Normal protein and therefore no albuminocytologic dissociation.  Vitals   Vitals:   11/01/23 1956 11/02/23 0015 11/02/23 0308 11/02/23 0741  BP: (!) 109/58 101/61 98/62 (!) 114/52  Pulse: 66 60 63 64  Resp: 18 18 18 18   Temp: 98.5 F (36.9 C) 98.2 F (36.8 C) 98.2 F (36.8 C) 99.5 F (37.5 C)  TempSrc: Oral Oral Oral Oral  SpO2: 97% 96% 97% 98%  Weight:      Height:         Body mass index is 25.24 kg/m.  Physical Exam   General: Laying comfortably in bed; in no acute distress.  HENT: Normal oropharynx and mucosa. Normal external appearance of ears and nose.  Neck: Supple, no pain or tenderness  CV: No JVD. No peripheral edema.  Pulmonary: Symmetric Chest rise. Normal respiratory effort.  Abdomen: Soft to touch, non-tender.  Ext: No  cyanosis, edema, or deformity  Skin: No rash. Normal palpation of skin.   Musculoskeletal: Normal digits and nails by inspection. No clubbing.   Neurologic Examination  Mental status/Cognition: Alert, oriented to self, place, month and year, good attention.  Speech/language: Fluent, comprehension intact, object naming intact, repetition intact.  Cranial nerves:   CN II Pupils equal and reactive to light, no VF deficits    CN III,IV,VI EOM intact, no gaze preference or deviation, no nystagmus    CN V normal sensation in V1, V2, and V3 segments bilaterally    CN VII no asymmetry, no nasolabial fold flattening    CN VIII normal hearing to speech    CN IX & X normal palatal elevation, no uvular deviation    CN XI 5/5 head turn and 5/5 shoulder shrug bilaterally    CN XII midline tongue protrusion    Motor:  Muscle bulk: normal, tone slightly increased in BL lower ext, pronator drift none tremor none Mvmt Root Nerve  Muscle Right Left Comments  SA C5/6 Ax Deltoid 5 5   EF C5/6 Mc Biceps 5 5   EE C6/7/8 Rad Triceps 5 5   WF C6/7 Med FCR     WE C7/8 PIN ECU     F Ab C8/T1 U ADM/FDI 5 5   HF L1/2/3 Fem Illopsoas 4+ 4+   KE L2/3/4 Fem Quad 4+ 4+   DF L4/5 D Peron Tib Ant 5 5  PF S1/2 Tibial Grc/Sol 5 5    Sensation:  Light touch Paresthesias in bottom of BL feet.   Pin prick    Temperature    Vibration   Proprioception    Coordination/Complex Motor:  - Finger to Nose intact BL - Heel to shin intact BL - Rapid alternating movement are normal - Gait: deferred.  Labs and Diagnostic Imaging   CBC:  Recent Labs  Lab 10/31/23 2121 11/01/23 0440 11/02/23 0621  WBC 5.3 4.4 4.0  NEUTROABS 2.1  --   --   HGB 12.2 12.0 13.1  HCT 36.4 35.5* 38.9  MCV 94.8 93.9 94.9  PLT 106* 92* 111*    Basic Metabolic Panel:  Lab Results  Component Value Date   NA 140 11/02/2023   K 4.7 11/02/2023   CO2 27 11/02/2023   GLUCOSE 106 (H) 11/02/2023   BUN 10 11/02/2023   CREATININE 0.66  11/02/2023   CALCIUM 9.4 11/02/2023   GFRNONAA >60 11/02/2023   GFRAA >60 07/12/2019   Lipid Panel: No results found for: "LDLCALC" HgbA1c:  Lab Results  Component Value Date   HGBA1C 5.3 11/01/2023   Urine Drug Screen: No results found for: "LABOPIA", "COCAINSCRNUR", "LABBENZ", "AMPHETMU", "THCU", "LABBARB"  Alcohol Level No results found for: "ETH" INR  Lab Results  Component Value Date   INR 0.9 12/09/2021   APTT  Lab Results  Component Value Date   APTT 26 12/09/2021   AED levels: No results found for: "PHENYTOIN", "ZONISAMIDE", "LAMOTRIGINE", "LEVETIRACETA"  CT Head without contrast(Personally reviewed): 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   Unable to get MRI Brain and spine 2/2 cochlear implants.   CT myelogram 1. When counting from the top there is transitional anatomy with a lumbarized S1 vertebral body. Correlation with radiographs is recommended prior to any operative intervention. 2. Previously seen thickening and nodularity of the cauda equina nerve roots appears improved (particularly when viewed on reformatted imaging) with nerve roots mildly enlarged in size. 3. 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 Multilevel mild foraminal stenosis is detailed above. 4. Mild canal stenosis at C4-C5, C5-C6, and C6-C7.  LP: Non infectious, no pleocytosis, normal protein and glucose.  Impression   who presents with a recurrent episode of foot paresthesia. She initially presented to neurology in February 2023 with 2 months of progressive paresthesia and weakness.  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  She had an LP done which demonstrated 18 WBC, protein of 109. 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  She presents again with 3 weeks of difficulty getting up from  couch, climbing stairs and 2 weeks of tingling/paresthesias in the bottom of her L foot and about 10 days of tingling and paresthesias in the bottom of her R foot.  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 Ct myelogram with maybe mild cauda equina nerve root edema, improved from prior. LP with no albuminocytologic dissociation, normal protein and WBC count.  Recommendations  - Serum and CSF CASPR2 antibodies are pending. - B1, B12, RPR - CSF for protein, glucose, cells - Started on pulse dose steroids. Will do IV for now and switch ti PO to finish the 5 days course. This depends on how she is doing clinically. ______________________________________________________________________   Welton Flakes, MD [facility]

## 2023-11-02 NOTE — Progress Notes (Signed)
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  This is similar to her prior presentation, and she responded well to [facility].  Neurology consulted and we are asked to admit  Subjective / 24h Interval events: Doing well, still has decreased sensation in her feet.  Assesement and Plan: Principal Problem:   Numbness and tingling of both feet Active Problems:   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HCC)   Elevated TSH  Principal problem Bilateral foot numbness -current diagnosis still unclear, discussed with neurology today, possibly autoimmune condition.  No clear-cut evidence for GBS, neurology sent CASPR2 antibody which is still pending -Start high-dose steroids today x 5 days  Active problems Chronic ITP-platelets overall stable, no bleeding  Elevated TSH-Free T4 normal.  Recheck TSH in 2 to 3 weeks  Low B12-borderline, 212, replaced  Scheduled Meds:  cyanocobalamin  1,000 mcg Intramuscular Once   pantoprazole  40 mg Oral Daily   polyethylene glycol  17 g Oral Daily   [START ON 11/03/2023] rosuvastatin  5 mg Oral Once per day on Monday Thursday   sodium chloride flush  3 mL Intravenous Q12H   Continuous Infusions:  methylPREDNISolone (SOLU-MEDROL) injection     PRN Meds:.acetaminophen **OR** acetaminophen, dicyclomine, fentaNYL (SUBLIMAZE) injection, lidocaine, ondansetron **OR** ondansetron (ZOFRAN) IV  Current Outpatient Medications  Medication Instructions   Calcium Carbonate-Vitamin D3 600-400 MG-UNIT TABS 2 tablets, Oral, Daily   dicyclomine (BENTYL) 10 mg, Oral, Every 8 hours PRN, Call for appointment please.   omeprazole (PRILOSEC) 40 mg,  Oral, Daily   polyethylene glycol powder (GLYCOLAX/MIRALAX) 17 g, Oral, Daily   rosuvastatin (CRESTOR) 5 mg, Oral, 2 times weekly    Diet Orders (From admission, onward)     Start     Ordered   11/01/23 1442  Diet regular Fluid consistency: Thin  Diet effective now       Question:  Fluid consistency:  Answer:  Thin   11/01/23 1441            DVT prophylaxis: SCDs Start: 11/01/23 0414   Lab Results  Component Value Date   PLT 111 (L) 11/02/2023      Code Status: Full Code  Family Communication: no family at bedside   Status is: Observation The patient will require care spanning > 2 midnights and should be moved to inpatient because: severity of illness, IV steroids x 5 days  Level of care: Telemetry Medical  Consultants:  Neurology   Objective: Vitals:   11/01/23 1956 11/02/23 0015 11/02/23 0308 11/02/23 0741  BP: (!) 109/58 101/61 98/62 (!) 114/52  Pulse: 66 60 63 64  Resp: 18 18 18 18   Temp: 98.5 F (36.9 C) 98.2 F (36.8 C) 98.2 F (36.8 C) 99.5 F (37.5 C)  TempSrc: Oral Oral Oral Oral  SpO2: 97% 96% 97% 98%  Weight:      Height:       No intake or output data in the 24 hours ending 11/02/23 0957 Wt Readings from Last 3 Encounters:  10/31/23 62.6 kg  03/31/23 64.6 kg  09/27/22 64.3 kg    Examination:  Constitutional: NAD Eyes: lids and conjunctivae normal, no scleral icterus ENMT: mmm Neck:  normal, supple Respiratory: clear to auscultation bilaterally, no wheezing, no crackles. Normal respiratory effort.  Cardiovascular: Regular rate and rhythm, no murmurs / rubs / gallops. No LE edema. Abdomen: soft, no distention, no tenderness. Bowel sounds positive.  Skin: no rashes Neurologic: no focal deficits, equal strength, decreased sensation bilateral feet  Data Reviewed: I have independently reviewed following labs and imaging studies   CBC Recent Labs  Lab 10/31/23 2121 11/01/23 0440 11/02/23 0621  WBC 5.3 4.4 4.0  HGB 12.2 12.0 13.1   HCT 36.4 35.5* 38.9  PLT 106* 92* 111*  MCV 94.8 93.9 94.9  MCH 31.8 31.7 32.0  MCHC 33.5 33.8 33.7  RDW 12.7 12.5 12.6  LYMPHSABS 2.7  --   --   MONOABS 0.4  --   --   EOSABS 0.1  --   --   BASOSABS 0.1  --   --     Recent Labs  Lab 10/31/23 2121 11/01/23 0040 11/01/23 0440 11/02/23 0621  NA 136  --  138 140  K 4.0  --  4.1 4.7  CL 105  --  106 106  CO2 23  --  26 27  GLUCOSE 98  --  100* 106*  BUN 19  --  14 10  CREATININE 0.74  --  0.77 0.66  CALCIUM 9.5  --  9.3 9.4  AST 17  --   --  15  ALT 12  --   --  11  ALKPHOS 35*  --   --  38  BILITOT 0.9  --   --  0.8  ALBUMIN 4.1  --   --  3.6  MG 2.1  --   --  2.0  TSH  --  8.449*  --   --   HGBA1C  --  5.3  --   --     ------------------------------------------------------------------------------------------------------------------ No results for input(s): "CHOL", "HDL", "LDLCALC", "TRIG", "CHOLHDL", "LDLDIRECT" in the last 72 hours.  Lab Results  Component Value Date   HGBA1C 5.3 11/01/2023   ------------------------------------------------------------------------------------------------------------------ Recent Labs    11/01/23 0040  TSH 8.449*    Cardiac Enzymes No results for input(s): "CKMB", "TROPONINI", "MYOGLOBIN" in the last 168 hours.  Invalid input(s): "CK" ------------------------------------------------------------------------------------------------------------------ No results found for: "BNP"  CBG: No results for input(s): "GLUCAP" in the last 168 hours.  Recent Results (from the past 240 hour(s))  CSF culture w Gram Stain     Status: None (Preliminary result)   Collection Time: 10/31/23 11:36 PM   Specimen: CSF; Cerebrospinal Fluid  Result Value Ref Range Status   Specimen Description CSF  Final   Special Requests NONE  Final   Gram Stain   Final    WBC PRESENT, PREDOMINANTLY MONONUCLEAR NO ORGANISMS SEEN CYTOSPIN SMEAR Performed at [facility], [address]  [place], [place] [address]    Culture PENDING  Incomplete   Report Status PENDING  Incomplete     Radiology Studies: CT CERVICAL SPINE W CONTRAST  Result Date: 11/01/2023 CLINICAL DATA:  Myelopathy, acute, cervical spine; Myelopathy, acute, thoracic spine; Myelopathy, acute, lumbar spine EXAM: CT CERVICAL, THORACIC, AND LUMBAR SPINE WITH CONTRAST TECHNIQUE: Multidetector CT imaging of the cervical, lumbar, and thoracic spine was performed with intravenous contrast. Multiplanar CT image reconstructions were also generated. RADIATION DOSE REDUCTION: This exam was performed according to the departmental dose-optimization program which includes automated exposure control, adjustment of the mA and/or kV according to patient size and/or use of iterative reconstruction technique. CONTRAST:  15 mL of Omnipaque 180 IV. COMPARISON:  CT lumbar myelogram January 27, 2022. FINDINGS: CT CERVICAL SPINE FINDINGS Alignment: Straightening.  No substantial sagittal subluxation. Skull base and vertebrae: No evidence of acute fracture. Vertebral body heights are maintained. Soft tissues: No prevertebral edema. Disc levels: C2-C3: No significant canal or foraminal stenosis. C3-C4: No significant canal or foraminal stenosis. C4-C5: Mild disc bulging and bilateral facet and uncovertebral hypertrophy. Mild canal and mild bilateral foraminal stenosis. C5-C6: Small posterior disc osteophyte complex and bilateral facet and uncovertebral hypertrophy. Resulting mild canal stenosis with mild-to-moderate bilateral foraminal stenosis. C6-C7: Posterior disc osteophyte complex and bilateral facet and uncovertebral hypertrophy. Resulting mild canal stenosis with moderate left foraminal stenosis. C7-T1: No significant canal or foraminal stenosis. Upper chest: Visualized lung apices are clear. CT THORACIC SPINE FINDINGS Alignment: No substantial sagittal subluxation. Mild reverse S-shaped thoracic curvature. Vertebrae: Vertebral body heights are  maintained. No evidence of acute fracture. Paraspinal and other soft tissues: No acute findings. Disc levels: No significant canal or foraminal stenosis. CT LUMBAR SPINE FINDINGS Segmentation: When counting from the top there is transitional anatomy with a lumbarized S1 vertebral body. Alignment: No substantial sagittal subluxation. Conus and canal: Conus terminates at L2-L3. Previously seen thickening and nodularity of the cauda equina nerve roots appears improved (particularly when viewed on reformatted imaging) with nerve roots now mildly enlarged in size. Small locule of gas in the canal is likely postoperative given intrathecal injection of contrast for this procedure. Vertebrae: No acute fracture. Paraspinal and other soft tissues: Unremarkable. Disc levels: T12-L1: No significant disc protrusion, foraminal stenosis, or canal stenosis. L1-L2: No significant disc protrusion, foraminal stenosis, or canal stenosis. L2-L3: No significant disc protrusion, foraminal stenosis, or canal stenosis. L3-L4: No significant disc protrusion, foraminal stenosis, or canal stenosis. L4-L5: Mild disc bulging. Mild facet arthropathy. No significant canal or foraminal stenosis. L5-S1: Mild facet arthropathy. No significant canal stenosis. Mild right greater than left foraminal stenosis, similar versus mildly progressed. IMPRESSION: 1. When counting from the top there is transitional anatomy with a lumbarized S1 vertebral body. Correlation with radiographs is recommended prior to any operative intervention. 2. Previously seen thickening and nodularity of the cauda equina nerve roots appears improved (particularly when viewed on reformatted imaging) with nerve roots mildly enlarged in size. 3. 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 Multilevel mild foraminal stenosis is detailed above. 4. Mild canal stenosis at C4-C5, C5-C6, and C6-C7. Electronically Signed   By: Feliberto Harts M.D.   On: 11/01/2023 16:00   CT THORACIC SPINE  W CONTRAST  Result Date: 11/01/2023 CLINICAL DATA:  Myelopathy, acute, cervical spine; Myelopathy, acute, thoracic spine; Myelopathy, acute, lumbar spine EXAM: CT CERVICAL, THORACIC, AND LUMBAR SPINE WITH CONTRAST TECHNIQUE: Multidetector CT imaging of the cervical, lumbar, and thoracic spine was performed with intravenous contrast. Multiplanar CT image reconstructions were also generated. RADIATION DOSE REDUCTION: This exam was performed according to the departmental dose-optimization program which includes automated exposure control, adjustment of the mA and/or kV according to patient size and/or use of iterative reconstruction technique. CONTRAST:  15 mL of Omnipaque 180 IV. COMPARISON:  CT lumbar myelogram January 27, 2022. FINDINGS: CT CERVICAL SPINE FINDINGS Alignment: Straightening.  No substantial sagittal subluxation. Skull base and vertebrae: No evidence of acute fracture. Vertebral body heights are maintained. Soft tissues: No prevertebral edema. Disc levels: C2-C3: No significant canal or foraminal stenosis. C3-C4: No significant canal or foraminal stenosis. C4-C5: Mild disc bulging and bilateral facet and uncovertebral hypertrophy. Mild canal and mild bilateral foraminal stenosis.  C5-C6: Small posterior disc osteophyte complex and bilateral facet and uncovertebral hypertrophy. Resulting mild canal stenosis with mild-to-moderate bilateral foraminal stenosis. C6-C7: Posterior disc osteophyte complex and bilateral facet and uncovertebral hypertrophy. Resulting mild canal stenosis with moderate left foraminal stenosis. C7-T1: No significant canal or foraminal stenosis. Upper chest: Visualized lung apices are clear. CT THORACIC SPINE FINDINGS Alignment: No substantial sagittal subluxation. Mild reverse S-shaped thoracic curvature. Vertebrae: Vertebral body heights are maintained. No evidence of acute fracture. Paraspinal and other soft tissues: No acute findings. Disc levels: No significant canal or  foraminal stenosis. CT LUMBAR SPINE FINDINGS Segmentation: When counting from the top there is transitional anatomy with a lumbarized S1 vertebral body. Alignment: No substantial sagittal subluxation. Conus and canal: Conus terminates at L2-L3. Previously seen thickening and nodularity of the cauda equina nerve roots appears improved (particularly when viewed on reformatted imaging) with nerve roots now mildly enlarged in size. Small locule of gas in the canal is likely postoperative given intrathecal injection of contrast for this procedure. Vertebrae: No acute fracture. Paraspinal and other soft tissues: Unremarkable. Disc levels: T12-L1: No significant disc protrusion, foraminal stenosis, or canal stenosis. L1-L2: No significant disc protrusion, foraminal stenosis, or canal stenosis. L2-L3: No significant disc protrusion, foraminal stenosis, or canal stenosis. L3-L4: No significant disc protrusion, foraminal stenosis, or canal stenosis. L4-L5: Mild disc bulging. Mild facet arthropathy. No significant canal or foraminal stenosis. L5-S1: Mild facet arthropathy. No significant canal stenosis. Mild right greater than left foraminal stenosis, similar versus mildly progressed. IMPRESSION: 1. When counting from the top there is transitional anatomy with a lumbarized S1 vertebral body. Correlation with radiographs is recommended prior to any operative intervention. 2. Previously seen thickening and nodularity of the cauda equina nerve roots appears improved (particularly when viewed on reformatted imaging) with nerve roots mildly enlarged in size. 3. 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 Multilevel mild foraminal stenosis is detailed above. 4. Mild canal stenosis at C4-C5, C5-C6, and C6-C7. Electronically Signed   By: Feliberto Harts M.D.   On: 11/01/2023 16:00   CT LUMBAR SPINE W CONTRAST  Result Date: 11/01/2023 CLINICAL DATA:  Myelopathy, acute, cervical spine; Myelopathy, acute, thoracic spine;  Myelopathy, acute, lumbar spine EXAM: CT CERVICAL, THORACIC, AND LUMBAR SPINE WITH CONTRAST TECHNIQUE: Multidetector CT imaging of the cervical, lumbar, and thoracic spine was performed with intravenous contrast. Multiplanar CT image reconstructions were also generated. RADIATION DOSE REDUCTION: This exam was performed according to the departmental dose-optimization program which includes automated exposure control, adjustment of the mA and/or kV according to patient size and/or use of iterative reconstruction technique. CONTRAST:  15 mL of Omnipaque 180 IV. COMPARISON:  CT lumbar myelogram January 27, 2022. FINDINGS: CT CERVICAL SPINE FINDINGS Alignment: Straightening.  No substantial sagittal subluxation. Skull base and vertebrae: No evidence of acute fracture. Vertebral body heights are maintained. Soft tissues: No prevertebral edema. Disc levels: C2-C3: No significant canal or foraminal stenosis. C3-C4: No significant canal or foraminal stenosis. C4-C5: Mild disc bulging and bilateral facet and uncovertebral hypertrophy. Mild canal and mild bilateral foraminal stenosis. C5-C6: Small posterior disc osteophyte complex and bilateral facet and uncovertebral hypertrophy. Resulting mild canal stenosis with mild-to-moderate bilateral foraminal stenosis. C6-C7: Posterior disc osteophyte complex and bilateral facet and uncovertebral hypertrophy. Resulting mild canal stenosis with moderate left foraminal stenosis. C7-T1: No significant canal or foraminal stenosis. Upper chest: Visualized lung apices are clear. CT THORACIC SPINE FINDINGS Alignment: No substantial sagittal subluxation. Mild reverse S-shaped thoracic curvature. Vertebrae: Vertebral body heights are maintained. No evidence of  acute fracture. Paraspinal and other soft tissues: No acute findings. Disc levels: No significant canal or foraminal stenosis. CT LUMBAR SPINE FINDINGS Segmentation: When counting from the top there is transitional anatomy with a  lumbarized S1 vertebral body. Alignment: No substantial sagittal subluxation. Conus and canal: Conus terminates at L2-L3. Previously seen thickening and nodularity of the cauda equina nerve roots appears improved (particularly when viewed on reformatted imaging) with nerve roots now mildly enlarged in size. Small locule of gas in the canal is likely postoperative given intrathecal injection of contrast for this procedure. Vertebrae: No acute fracture. Paraspinal and other soft tissues: Unremarkable. Disc levels: T12-L1: No significant disc protrusion, foraminal stenosis, or canal stenosis. L1-L2: No significant disc protrusion, foraminal stenosis, or canal stenosis. L2-L3: No significant disc protrusion, foraminal stenosis, or canal stenosis. L3-L4: No significant disc protrusion, foraminal stenosis, or canal stenosis. L4-L5: Mild disc bulging. Mild facet arthropathy. No significant canal or foraminal stenosis. L5-S1: Mild facet arthropathy. No significant canal stenosis. Mild right greater than left foraminal stenosis, similar versus mildly progressed. IMPRESSION: 1. When counting from the top there is transitional anatomy with a lumbarized S1 vertebral body. Correlation with radiographs is recommended prior to any operative intervention. 2. Previously seen thickening and nodularity of the cauda equina nerve roots appears improved (particularly when viewed on reformatted imaging) with nerve roots mildly enlarged in size. 3. 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 Multilevel mild foraminal stenosis is detailed above. 4. Mild canal stenosis at C4-C5, C5-C6, and C6-C7. Electronically Signed   By: Feliberto Harts M.D.   On: 11/01/2023 16:00     Pamella Pert, MD, PhD [facility]  Between 7 am - 7 pm I am available, please contact me via Amion (for emergencies) or Securechat (non urgent messages)  Between 7 pm - 7 am I am not available, please contact night coverage MD/APP via Amion

## 2023-11-03 DIAGNOSIS — R200 Anesthesia of skin: Secondary | ICD-10-CM | POA: Diagnosis not present

## 2023-11-03 DIAGNOSIS — R202 Paresthesia of skin: Secondary | ICD-10-CM | POA: Diagnosis not present

## 2023-11-03 LAB — BASIC METABOLIC PANEL
Anion gap: 8 (ref 5–15)
BUN: 13 mg/dL (ref 8–23)
CO2: 23 mmol/L (ref 22–32)
Calcium: 9.3 mg/dL (ref 8.9–10.3)
Chloride: 104 mmol/L (ref 98–111)
Creatinine, Ser: 0.6 mg/dL (ref 0.44–1.00)
GFR, Estimated: >60 mL/min (ref >60)
Glucose, Bld: 144 mg/dL — ABNORMAL HIGH (ref 70–99)
Potassium: 4.2 mmol/L (ref 3.5–5.1)
Sodium: 135 mmol/L (ref 135–145)

## 2023-11-03 LAB — CBC
HCT: 38.1 % (ref 36.0–46.0)
Hemoglobin: 13 g/dL (ref 12.0–15.0)
MCH: 32 pg (ref 26.0–34.0)
MCHC: 34.1 g/dL (ref 30.0–36.0)
MCV: 93.8 fL (ref 80.0–100.0)
Platelets: 113 10*3/uL — ABNORMAL LOW (ref 150–400)
RBC: 4.06 MIL/uL (ref 3.87–5.11)
RDW: 12.4 % (ref 11.5–15.5)
WBC: 9.5 10*3/uL (ref 4.0–10.5)
nRBC: 0 % (ref 0.0–0.2)

## 2023-11-03 LAB — VITAMIN B1: Vitamin B1 (Thiamine): 75.3 nmol/L (ref 66.5–200.0)

## 2023-11-03 LAB — MISC LABCORP TEST (SEND OUT)
Labcorp test code: 505705
Source (LabCorp): 1

## 2023-11-03 MED ORDER — MELATONIN 3 MG PO TABS
3.0000 mg | ORAL_TABLET | Freq: Once | ORAL | Status: AC | PRN
  Administered 2023-11-03: 3 mg via ORAL
  Filled 2023-11-03: qty 1

## 2023-11-03 MED ORDER — MUSCLE RUB 10-15 % EX CREA
TOPICAL_CREAM | CUTANEOUS | Status: DC | PRN
  Filled 2023-11-03: qty 85

## 2023-11-03 MED ORDER — LIDOCAINE 5 % EX PTCH
1.0000 | MEDICATED_PATCH | CUTANEOUS | Status: DC
  Administered 2023-11-03: 1 via TRANSDERMAL
  Filled 2023-11-03: qty 1

## 2023-11-03 NOTE — Plan of Care (Signed)

## 2023-11-03 NOTE — Progress Notes (Signed)
NEUROLOGY CONSULT FOLLOW UP NOTE   Date of service: November 03, 2023 Patient Name: JEMINI REDMAN MRN:  [id number] DOB:  25-Oct-1961  Brief HPI  Evelisse R Mikelson is a 62 y.o. female who presents with a recurrent episode of foot paresthesia. She initially presented to neurology in February 2023 with 2 months of progressive paresthesia and weakness.  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  She had an LP done which demonstrated 18 WBC, protein of 109. 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  She presents again with 3 weeks of difficulty getting up from couch, climbing stairs and 2 weeks of tingling/paresthesias in the bottom of her L foot and about 10 days of tingling and paresthesias in the bottom of her R foot.  CT myelogram with "Previously seen thickening and nodularity of the cauda equina nerve roots appears improved (particularly when viewed on reformatted imaging) with nerve roots mildly enlarged in size."  LP not concerning for CSF infection. Normal protein and therefore no albuminocytologic dissociation.   Interval Hx/subjective   Pain at the site of LP, tenderness to palpation, no bruise. Also reports point tenderness in the back of her neck that seems to extend into her head. Pain improves with rubbing and seems to be in the muscle.  Feels stronger and paresthesias in her feet are improved.   Vitals   Vitals:   11/03/23 0003 11/03/23 0321 11/03/23 0808 11/03/23 1211  BP: 96/63 108/63 113/61 125/71  Pulse: 63 65 70 92  Resp: 18 18 16 16   Temp: 98 F (36.7 C) 98.2 F (36.8 C) 98.8 F (37.1 C) 98.4 F (36.9 C)  TempSrc: Oral Oral Oral Oral  SpO2: 98% 96% 98% 98%  Weight:      Height:         Body mass index is 25.24 kg/m.  Physical Exam   General: Laying comfortably in bed; in no acute distress.   HENT: Normal oropharynx and mucosa. Normal external appearance of ears and nose.  Neck: Supple, no pain or tenderness  CV: No JVD. No peripheral edema.  Pulmonary: Symmetric Chest rise. Normal respiratory effort.  Abdomen: Soft to touch, non-tender.  Ext: No cyanosis, edema, or deformity  Skin: No rash. Normal palpation of skin.   Musculoskeletal: Normal digits and nails by inspection. No clubbing.   Neurologic Examination  Mental status/Cognition: Alert, oriented to self, place, month and year, good attention.  Speech/language: Fluent, comprehension intact, object naming intact, repetition intact.  Cranial nerves:   CN II Pupils equal and reactive to light, no VF deficits    CN III,IV,VI EOM intact, no gaze preference or deviation, no nystagmus    CN V normal sensation in V1, V2, and V3 segments bilaterally    CN VII no asymmetry, no nasolabial fold flattening    CN VIII normal hearing to speech    CN IX & X normal palatal elevation, no uvular deviation    CN XI 5/5 head turn and 5/5 shoulder shrug bilaterally    CN XII midline tongue protrusion    Motor:  Muscle bulk: normal, tone slightly increased in BL lower ext, pronator drift none tremor none Mvmt Root Nerve  Muscle Right Left Comments  SA C5/6 Ax Deltoid 5 5   EF C5/6 Mc Biceps 5 5   EE C6/7/8 Rad Triceps 5 5  WF C6/7 Med FCR     WE C7/8 PIN ECU     F Ab C8/T1 U ADM/FDI 5 5   HF L1/2/3 Fem Illopsoas 5 5   KE L2/3/4 Fem Quad 5 5   DF L4/5 D Peron Tib Ant 5 5   PF S1/2 Tibial Grc/Sol 5 5    Sensation:  Light touch Paresthesias in bottom of BL feet improved from yesterday.   Pin prick    Temperature    Vibration   Proprioception    Coordination/Complex Motor:  - Finger to Nose intact BL - Heel to shin intact BL - Rapid alternating movement are normal - Gait: deferred.  Labs and Diagnostic Imaging   CBC:  Recent Labs  Lab 10/31/23 2121 11/01/23 0440 11/02/23 0621 11/03/23 0516  WBC 5.3   < > 4.0 9.5   NEUTROABS 2.1  --   --   --   HGB 12.2   < > 13.1 13.0  HCT 36.4   < > 38.9 38.1  MCV 94.8   < > 94.9 93.8  PLT 106*   < > 111* 113*   < > = values in this interval not displayed.    Basic Metabolic Panel:  Lab Results  Component Value Date   NA 135 11/03/2023   K 4.2 11/03/2023   CO2 23 11/03/2023   GLUCOSE 144 (H) 11/03/2023   BUN 13 11/03/2023   CREATININE 0.60 11/03/2023   CALCIUM 9.3 11/03/2023   GFRNONAA >60 11/03/2023   GFRAA >60 07/12/2019   Lipid Panel: No results found for: "LDLCALC" HgbA1c:  Lab Results  Component Value Date   HGBA1C 5.3 11/01/2023   Urine Drug Screen: No results found for: "LABOPIA", "COCAINSCRNUR", "LABBENZ", "AMPHETMU", "THCU", "LABBARB"  Alcohol Level No results found for: "ETH" INR  Lab Results  Component Value Date   INR 0.9 12/09/2021   APTT  Lab Results  Component Value Date   APTT 26 12/09/2021   AED levels: No results found for: "PHENYTOIN", "ZONISAMIDE", "LAMOTRIGINE", "LEVETIRACETA"  CT Head without contrast(Personally reviewed): 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   Unable to get MRI Brain and spine 2/2 cochlear implants.   CT myelogram 1. When counting from the top there is transitional anatomy with a lumbarized S1 vertebral body. Correlation with radiographs is recommended prior to any operative intervention. 2. Previously seen thickening and nodularity of the cauda equina nerve roots appears improved (particularly when viewed on reformatted imaging) with nerve roots mildly enlarged in size. 3. 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 Multilevel mild foraminal stenosis is detailed above. 4. Mild canal stenosis at C4-C5, C5-C6, and C6-C7.  LP: Non infectious, no pleocytosis, normal protein and glucose.  Impression   who presents with a recurrent episode of foot paresthesia. She initially presented to neurology in February 2023 with 2 months of progressive paresthesia and  weakness.  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  She had an LP done which demonstrated 18 WBC, protein of 109. 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  She presents again with 3 weeks of difficulty getting up from couch, climbing stairs and 2 weeks of tingling/paresthesias in the bottom of her L foot and about 10 days of tingling and paresthesias in the bottom of her R foot.  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 Ct myelogram with maybe mild cauda equina nerve root edema, improved from prior. LP with no albuminocytologic dissociation, normal protein and WBC count.  Recommendations  - Serum and CSF CASPR2 antibodies are pending. - B1, B12, RPR non revealing. - Started on pulse dose steroids. Will do IV for now and switch to PO to finish the 5 days course. This depends on how she is doing clinically. - Lidocaine patch for pain in her lower mid back and [place] for cervicogenic headache. - Pt and OT. ______________________________________________________________________   Welton Flakes, MD [facility]

## 2023-11-03 NOTE — Progress Notes (Signed)
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  This is similar to her prior presentation, and she responded well to [facility].  Neurology consulted and we are asked to admit  Assessment & Plan:   Principal Problem:   Numbness and tingling of both feet Active Problems:   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HCC)   Elevated TSH   Neuropathy  Bilateral foot numbness  -current diagnosis still unclear -Neurology concerned for possible autoimmune condition - CSF/labs pending -Start high-dose steroids today x 5 days   Chronic ITP-platelets overall stable, no bleeding   Elevated TSH-Free T4 normal.  Recheck TSH in 2 to 3 weeks   Low B12-borderline, 212, replaced  DVT prophylaxis: SCDs Start: 11/01/23 0414   Code Status:   Code Status: Full Code  Family Communication: None  Status is: Inpt  Dispo: The patient is from: Home              Anticipated d/c is to: SNF              Anticipated d/c date is: 24-48h              Patient currently not medically stable for discharge  Consultants:  Neuro  Procedures:  None  Antimicrobials:  None   Subjective: No acute issue/events overnight  Objective: Vitals:   11/02/23 1522 11/02/23 2020 11/03/23 0003 11/03/23 0321  BP: 115/74 93/60 96/63  108/63  Pulse: 66 72 63 65  Resp: 18 18 18 18   Temp: 98.4 F (36.9 C) 97.6 F (36.4 C) 98 F (36.7 C) 98.2 F (36.8 C)  TempSrc: Oral Oral Oral Oral  SpO2: 97% 94% 98% 96%  Weight:      Height:        Intake/Output Summary (Last 24 hours) at 11/03/2023 0746 Last data filed at 11/03/2023 0327 Gross per 24 hour  Intake 66 ml  Output --  Net 66 ml   Filed Weights   10/31/23 2042  Weight: 62.6 kg     Examination:  General exam: Appears calm and comfortable  Respiratory system: Clear to auscultation. Respiratory effort normal. Cardiovascular system: S1 & S2 heard, RRR. No JVD, murmurs, rubs, gallops or clicks. No pedal edema. Gastrointestinal system: Abdomen is nondistended, soft and nontender. No organomegaly or masses felt. Normal bowel sounds heard. Central nervous system: Alert and oriented. No focal neurological deficits. Extremities: Symmetric 5 x 5 power. Skin: No rashes, lesions or ulcers Psychiatry: Judgement and insight appear normal. Mood & affect appropriate.     Data Reviewed: I have personally reviewed following labs and imaging studies  CBC: Recent Labs  Lab 10/31/23 2121 11/01/23 0440 11/02/23 0621 11/03/23 0516  WBC 5.3 4.4 4.0 9.5  NEUTROABS 2.1  --   --   --   HGB 12.2 12.0 13.1 13.0  HCT 36.4 35.5* 38.9 38.1  MCV 94.8 93.9 94.9 93.8  PLT 106* 92* 111* 113*   Basic Metabolic Panel: Recent Labs  Lab 10/31/23 2121 11/01/23 0440 11/02/23 0621 11/03/23 0516  NA 136 138 140 135  K 4.0 4.1 4.7 4.2  CL 105 106 106 104  CO2 23 26 27 23   GLUCOSE 98 100* 106* 144*  BUN 19 14 10  13  CREATININE 0.74 0.77 0.66 0.60  CALCIUM 9.5 9.3 9.4 9.3  MG 2.1  --  2.0  --    GFR: Estimated Creatinine Clearance: 64.2 mL/min (by C-G formula based on SCr of 0.6 mg/dL). Liver Function Tests: Recent Labs  Lab 10/31/23 2121 11/02/23 0621  AST 17 15  ALT 12 11  ALKPHOS 35* 38  BILITOT 0.9 0.8  PROT 6.7 6.2*  ALBUMIN 4.1 3.6   No results for input(s): "LIPASE", "AMYLASE" in the last 168 hours. No results for input(s): "AMMONIA" in the last 168 hours. Coagulation Profile: No results for input(s): "INR", "PROTIME" in the last 168 hours. Cardiac Enzymes: No results for input(s): "CKTOTAL", "CKMB", "CKMBINDEX", "TROPONINI" in the last 168 hours. BNP (last 3 results) No results for input(s): "PROBNP" in the last 8760 hours. HbA1C: Recent Labs     11/01/23 0040  HGBA1C 5.3   CBG: No results for input(s): "GLUCAP" in the last 168 hours. Lipid Profile: No results for input(s): "CHOL", "HDL", "LDLCALC", "TRIG", "CHOLHDL", "LDLDIRECT" in the last 72 hours. Thyroid Function Tests: Recent Labs    11/01/23 0040 11/01/23 0440  TSH 8.449*  --   FREET4  --  0.91   Anemia Panel: Recent Labs    11/01/23 0040  VITAMINB12 212  FOLATE 11.4   Sepsis Labs: No results for input(s): "PROCALCITON", "LATICACIDVEN" in the last 168 hours.  Recent Results (from the past 240 hour(s))  CSF culture w Gram Stain     Status: None (Preliminary result)   Collection Time: 10/31/23 11:36 PM   Specimen: CSF; Cerebrospinal Fluid  Result Value Ref Range Status   Specimen Description CSF  Final   Special Requests NONE  Final   Gram Stain   Final    WBC PRESENT, PREDOMINANTLY MONONUCLEAR NO ORGANISMS SEEN CYTOSPIN SMEAR    Culture   Final    NO GROWTH < 24 HOURS Performed at [facility], [address], [place], [place] [address]    Report Status PENDING  Incomplete         Radiology Studies: DG FL GUIDED LP INJECT MYELOGRAM 2+ REGION  Result Date: 11/02/2023 CLINICAL DATA:  62 year old female. With recurrent bilateral foot paresthesia. Request is for lumbar puncture with concurrent myelogram EXAM: LUMBAR PUNCTURE UNDER FLUOROSCOPY PROCEDURE: An appropriate skin entry site was determined fluoroscopically. Operator donned sterile gloves and mask. Skin site was marked, then prepped with Betadine, draped in usual sterile fashion, and infiltrated locally with 1% lidocaine. A 20 gauge 3.5 inch spinal needle advanced into the thecal sac at L3-4 from a right interlaminar approach. Clear colorless CSF spontaneously returned, with opening pressure of 15 cm water. 14 ml CSF were collected and divided among 4 sterile vials for the requested laboratory studies. Subsequently, 20 mL of Omnipaque 180 contrast were slowly hand injected into the thecal  sac under intermittent fluoroscopy. The inner stylet was replaced within the needle and the needle was removed in its entirety. A dressing was applied at the skin entry site. The patient tolerated the procedure well and no immediate post-procedure complication was apparent The needle was then removed. The patient tolerated the procedure well and there were no complications. FLUOROSCOPY: Radiation Exposure Index (as provided by the fluoroscopic device): 5.2 mGy Kerma IMPRESSION: Successful fluoroscopic-guided lumbar puncture and intrathecal contrast injection for myelogram. CT C, T and L spine myelograms will be dictated separately. This exam was performed by Anders Grant NP, and was supervised and interpreted by Dr. Roanna Banning Electronically Signed  By: Roanna Banning M.D.   On: 11/02/2023 15:12   CT CERVICAL SPINE W CONTRAST  Result Date: 11/01/2023 CLINICAL DATA:  Myelopathy, acute, cervical spine; Myelopathy, acute, thoracic spine; Myelopathy, acute, lumbar spine EXAM: CT CERVICAL, THORACIC, AND LUMBAR SPINE WITH CONTRAST TECHNIQUE: Multidetector CT imaging of the cervical, lumbar, and thoracic spine was performed with intravenous contrast. Multiplanar CT image reconstructions were also generated. RADIATION DOSE REDUCTION: This exam was performed according to the departmental dose-optimization program which includes automated exposure control, adjustment of the mA and/or kV according to patient size and/or use of iterative reconstruction technique. CONTRAST:  15 mL of Omnipaque 180 IV. COMPARISON:  CT lumbar myelogram January 27, 2022. FINDINGS: CT CERVICAL SPINE FINDINGS Alignment: Straightening.  No substantial sagittal subluxation. Skull base and vertebrae: No evidence of acute fracture. Vertebral body heights are maintained. Soft tissues: No prevertebral edema. Disc levels: C2-C3: No significant canal or foraminal stenosis. C3-C4: No significant canal or foraminal stenosis. C4-C5: Mild disc  bulging and bilateral facet and uncovertebral hypertrophy. Mild canal and mild bilateral foraminal stenosis. C5-C6: Small posterior disc osteophyte complex and bilateral facet and uncovertebral hypertrophy. Resulting mild canal stenosis with mild-to-moderate bilateral foraminal stenosis. C6-C7: Posterior disc osteophyte complex and bilateral facet and uncovertebral hypertrophy. Resulting mild canal stenosis with moderate left foraminal stenosis. C7-T1: No significant canal or foraminal stenosis. Upper chest: Visualized lung apices are clear. CT THORACIC SPINE FINDINGS Alignment: No substantial sagittal subluxation. Mild reverse S-shaped thoracic curvature. Vertebrae: Vertebral body heights are maintained. No evidence of acute fracture. Paraspinal and other soft tissues: No acute findings. Disc levels: No significant canal or foraminal stenosis. CT LUMBAR SPINE FINDINGS Segmentation: When counting from the top there is transitional anatomy with a lumbarized S1 vertebral body. Alignment: No substantial sagittal subluxation. Conus and canal: Conus terminates at L2-L3. Previously seen thickening and nodularity of the cauda equina nerve roots appears improved (particularly when viewed on reformatted imaging) with nerve roots now mildly enlarged in size. Small locule of gas in the canal is likely postoperative given intrathecal injection of contrast for this procedure. Vertebrae: No acute fracture. Paraspinal and other soft tissues: Unremarkable. Disc levels: T12-L1: No significant disc protrusion, foraminal stenosis, or canal stenosis. L1-L2: No significant disc protrusion, foraminal stenosis, or canal stenosis. L2-L3: No significant disc protrusion, foraminal stenosis, or canal stenosis. L3-L4: No significant disc protrusion, foraminal stenosis, or canal stenosis. L4-L5: Mild disc bulging. Mild facet arthropathy. No significant canal or foraminal stenosis. L5-S1: Mild facet arthropathy. No significant canal stenosis.  Mild right greater than left foraminal stenosis, similar versus mildly progressed. IMPRESSION: 1. When counting from the top there is transitional anatomy with a lumbarized S1 vertebral body. Correlation with radiographs is recommended prior to any operative intervention. 2. Previously seen thickening and nodularity of the cauda equina nerve roots appears improved (particularly when viewed on reformatted imaging) with nerve roots mildly enlarged in size. 3. 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 Multilevel mild foraminal stenosis is detailed above. 4. Mild canal stenosis at C4-C5, C5-C6, and C6-C7. Electronically Signed   By: Feliberto Harts M.D.   On: 11/01/2023 16:00   CT THORACIC SPINE W CONTRAST  Result Date: 11/01/2023 CLINICAL DATA:  Myelopathy, acute, cervical spine; Myelopathy, acute, thoracic spine; Myelopathy, acute, lumbar spine EXAM: CT CERVICAL, THORACIC, AND LUMBAR SPINE WITH CONTRAST TECHNIQUE: Multidetector CT imaging of the cervical, lumbar, and thoracic spine was performed with intravenous contrast. Multiplanar CT image reconstructions were also generated. RADIATION DOSE REDUCTION: This exam was performed according to the  departmental dose-optimization program which includes automated exposure control, adjustment of the mA and/or kV according to patient size and/or use of iterative reconstruction technique. CONTRAST:  15 mL of Omnipaque 180 IV. COMPARISON:  CT lumbar myelogram January 27, 2022. FINDINGS: CT CERVICAL SPINE FINDINGS Alignment: Straightening.  No substantial sagittal subluxation. Skull base and vertebrae: No evidence of acute fracture. Vertebral body heights are maintained. Soft tissues: No prevertebral edema. Disc levels: C2-C3: No significant canal or foraminal stenosis. C3-C4: No significant canal or foraminal stenosis. C4-C5: Mild disc bulging and bilateral facet and uncovertebral hypertrophy. Mild canal and mild bilateral foraminal stenosis. C5-C6: Small posterior  disc osteophyte complex and bilateral facet and uncovertebral hypertrophy. Resulting mild canal stenosis with mild-to-moderate bilateral foraminal stenosis. C6-C7: Posterior disc osteophyte complex and bilateral facet and uncovertebral hypertrophy. Resulting mild canal stenosis with moderate left foraminal stenosis. C7-T1: No significant canal or foraminal stenosis. Upper chest: Visualized lung apices are clear. CT THORACIC SPINE FINDINGS Alignment: No substantial sagittal subluxation. Mild reverse S-shaped thoracic curvature. Vertebrae: Vertebral body heights are maintained. No evidence of acute fracture. Paraspinal and other soft tissues: No acute findings. Disc levels: No significant canal or foraminal stenosis. CT LUMBAR SPINE FINDINGS Segmentation: When counting from the top there is transitional anatomy with a lumbarized S1 vertebral body. Alignment: No substantial sagittal subluxation. Conus and canal: Conus terminates at L2-L3. Previously seen thickening and nodularity of the cauda equina nerve roots appears improved (particularly when viewed on reformatted imaging) with nerve roots now mildly enlarged in size. Small locule of gas in the canal is likely postoperative given intrathecal injection of contrast for this procedure. Vertebrae: No acute fracture. Paraspinal and other soft tissues: Unremarkable. Disc levels: T12-L1: No significant disc protrusion, foraminal stenosis, or canal stenosis. L1-L2: No significant disc protrusion, foraminal stenosis, or canal stenosis. L2-L3: No significant disc protrusion, foraminal stenosis, or canal stenosis. L3-L4: No significant disc protrusion, foraminal stenosis, or canal stenosis. L4-L5: Mild disc bulging. Mild facet arthropathy. No significant canal or foraminal stenosis. L5-S1: Mild facet arthropathy. No significant canal stenosis. Mild right greater than left foraminal stenosis, similar versus mildly progressed. IMPRESSION: 1. When counting from the top there is  transitional anatomy with a lumbarized S1 vertebral body. Correlation with radiographs is recommended prior to any operative intervention. 2. Previously seen thickening and nodularity of the cauda equina nerve roots appears improved (particularly when viewed on reformatted imaging) with nerve roots mildly enlarged in size. 3. 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 Multilevel mild foraminal stenosis is detailed above. 4. Mild canal stenosis at C4-C5, C5-C6, and C6-C7. Electronically Signed   By: Feliberto Harts M.D.   On: 11/01/2023 16:00   CT LUMBAR SPINE W CONTRAST  Result Date: 11/01/2023 CLINICAL DATA:  Myelopathy, acute, cervical spine; Myelopathy, acute, thoracic spine; Myelopathy, acute, lumbar spine EXAM: CT CERVICAL, THORACIC, AND LUMBAR SPINE WITH CONTRAST TECHNIQUE: Multidetector CT imaging of the cervical, lumbar, and thoracic spine was performed with intravenous contrast. Multiplanar CT image reconstructions were also generated. RADIATION DOSE REDUCTION: This exam was performed according to the departmental dose-optimization program which includes automated exposure control, adjustment of the mA and/or kV according to patient size and/or use of iterative reconstruction technique. CONTRAST:  15 mL of Omnipaque 180 IV. COMPARISON:  CT lumbar myelogram January 27, 2022. FINDINGS: CT CERVICAL SPINE FINDINGS Alignment: Straightening.  No substantial sagittal subluxation. Skull base and vertebrae: No evidence of acute fracture. Vertebral body heights are maintained. Soft tissues: No prevertebral edema. Disc levels: C2-C3: No significant canal or  foraminal stenosis. C3-C4: No significant canal or foraminal stenosis. C4-C5: Mild disc bulging and bilateral facet and uncovertebral hypertrophy. Mild canal and mild bilateral foraminal stenosis. C5-C6: Small posterior disc osteophyte complex and bilateral facet and uncovertebral hypertrophy. Resulting mild canal stenosis with mild-to-moderate bilateral  foraminal stenosis. C6-C7: Posterior disc osteophyte complex and bilateral facet and uncovertebral hypertrophy. Resulting mild canal stenosis with moderate left foraminal stenosis. C7-T1: No significant canal or foraminal stenosis. Upper chest: Visualized lung apices are clear. CT THORACIC SPINE FINDINGS Alignment: No substantial sagittal subluxation. Mild reverse S-shaped thoracic curvature. Vertebrae: Vertebral body heights are maintained. No evidence of acute fracture. Paraspinal and other soft tissues: No acute findings. Disc levels: No significant canal or foraminal stenosis. CT LUMBAR SPINE FINDINGS Segmentation: When counting from the top there is transitional anatomy with a lumbarized S1 vertebral body. Alignment: No substantial sagittal subluxation. Conus and canal: Conus terminates at L2-L3. Previously seen thickening and nodularity of the cauda equina nerve roots appears improved (particularly when viewed on reformatted imaging) with nerve roots now mildly enlarged in size. Small locule of gas in the canal is likely postoperative given intrathecal injection of contrast for this procedure. Vertebrae: No acute fracture. Paraspinal and other soft tissues: Unremarkable. Disc levels: T12-L1: No significant disc protrusion, foraminal stenosis, or canal stenosis. L1-L2: No significant disc protrusion, foraminal stenosis, or canal stenosis. L2-L3: No significant disc protrusion, foraminal stenosis, or canal stenosis. L3-L4: No significant disc protrusion, foraminal stenosis, or canal stenosis. L4-L5: Mild disc bulging. Mild facet arthropathy. No significant canal or foraminal stenosis. L5-S1: Mild facet arthropathy. No significant canal stenosis. Mild right greater than left foraminal stenosis, similar versus mildly progressed. IMPRESSION: 1. When counting from the top there is transitional anatomy with a lumbarized S1 vertebral body. Correlation with radiographs is recommended prior to any operative  intervention. 2. Previously seen thickening and nodularity of the cauda equina nerve roots appears improved (particularly when viewed on reformatted imaging) with nerve roots mildly enlarged in size. 3. 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 Multilevel mild foraminal stenosis is detailed above. 4. Mild canal stenosis at C4-C5, C5-C6, and C6-C7. Electronically Signed   By: Feliberto Harts M.D.   On: 11/01/2023 16:00        Scheduled Meds:  cyanocobalamin  1,000 mcg Intramuscular Once   pantoprazole  40 mg Oral Daily   polyethylene glycol  17 g Oral Daily   rosuvastatin  5 mg Oral Once per day on Monday Thursday   sodium chloride flush  3 mL Intravenous Q12H   Continuous Infusions:  methylPREDNISolone (SOLU-MEDROL) injection 66 mL/hr at 11/03/23 0327     LOS: 1 day   Time spent:  Azucena Fallen, DO [facility]  If 7PM-7AM, please contact night-coverage [email or web address]  11/03/2023, 7:46 AM

## 2023-11-03 NOTE — TOC Initial Note (Signed)
Transition of Care [facility]) - Initial/Assessment Note    Patient Details  Name: Christine Brady MRN: [id number] Date of Birth: Aug 13, 1961  [facility] CM/SW Contact:    Kermit Balo, RN Phone Number: 11/03/2023, 11:06 AM  Clinical Narrative:                  Pt is from home alone. She states that her neighbors can check in on her if needed.  She hopes to have relief from back pain since LP before dc home.  She denies any issues with home medications.  She drives self as needed.  TOC following for dc needs.    Expected Discharge Plan: Home/Self Care Barriers to Discharge: Continued Medical Work up   Patient Goals and CMS Choice            Expected Discharge Plan and Services   Discharge Planning Services: CM Consult   Living arrangements for the past 2 months: Single Family Home                                      Prior Living Arrangements/Services Living arrangements for the past 2 months: Single Family Home Lives with:: Self Patient language and need for interpreter reviewed:: Yes Do you feel safe going back to the place where you live?: Yes        Care giver support system in place?: No (comment) Current home services: DME (cane) Criminal Activity/Legal Involvement Pertinent to Current Situation/Hospitalization: No - Comment as needed  Activities of Daily Living   ADL Screening (condition at time of admission) Independently performs ADLs?: Yes (appropriate for developmental age) Is the patient deaf or have difficulty hearing?: Yes Does the patient have difficulty seeing, even when wearing glasses/contacts?: Yes (blurred visions) Does the patient have difficulty concentrating, remembering, or making decisions?: No  Permission Sought/Granted                  Emotional Assessment Appearance:: Appears stated age Attitude/Demeanor/Rapport: Engaged Affect (typically observed): Accepting Orientation: : Oriented to Self, Oriented  to Place, Oriented to  Time, Oriented to Situation   Psych Involvement: No (comment)  Admission diagnosis:  Sensory disturbance [R20.9] Numbness and tingling of both feet [R20.0, R20.2] Neuropathy [G62.9] Patient Active Problem List   Diagnosis Date Noted   Neuropathy 11/02/2023   Numbness and tingling of both feet 11/01/2023   Elevated TSH 11/01/2023   UTI (urinary tract infection) 02/19/2022   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   DVT (deep venous thrombosis) (HCC) 02/09/2022   Deep venous thrombosis of lower extremity (HCC) 12/14/2021   Patellar instability of right knee    Complex tear of lateral meniscus of right knee    Loose body in knee, right knee    Constipation 06/05/2021   Diverticular disease of colon 06/05/2021   Family history of other diseases of the digestive system 06/05/2021   Nausea 06/05/2021   Otalgia, left 06/05/2021   Right upper quadrant pain 06/05/2021   Acute lateral meniscus tear of right knee 06/05/2021   Patellar dislocation, right, sequela 06/05/2021   Diplacusis of left ear 10/02/2020   History of right mastoidectomy 04/10/2020   Postop check 02/29/2020   Bowel obstruction (HCC) 09/28/2019   Idiopathic thrombocytopenia (HCC) 09/28/2019   Eustachian tube dysfunction, bilateral 09/28/2019   Tinnitus aurium, bilateral 09/28/2019   Tympanic membrane central perforation, 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  Unspecified cholesteatoma, left ear 09/28/2019   IBS (irritable bowel syndrome) 07/09/2019   Headache 07/09/2019   GERD (gastroesophageal reflux disease) 07/09/2019   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 [place] [address] Phone: [phone] Fax: [phone]  [facility] [address] [place] [address] Phone: [phone] Fax: [phone]     Social Determinants of Health (SDOH) Social History: SDOH Screenings   Food Insecurity: No Food Insecurity (11/01/2023)  Housing: Low Risk  (11/01/2023)  Transportation Needs: No Transportation Needs (11/01/2023)  Utilities: Not At Risk (11/01/2023)  Tobacco Use: Low Risk  (11/01/2023)   SDOH Interventions:     Readmission Risk Interventions     No data to display

## 2023-11-04 ENCOUNTER — Other Ambulatory Visit (HOSPITAL_COMMUNITY): Payer: Self-pay

## 2023-11-04 DIAGNOSIS — R202 Paresthesia of skin: Secondary | ICD-10-CM | POA: Diagnosis not present

## 2023-11-04 DIAGNOSIS — R200 Anesthesia of skin: Secondary | ICD-10-CM | POA: Diagnosis not present

## 2023-11-04 LAB — CSF CULTURE W GRAM STAIN: Culture: NO GROWTH

## 2023-11-04 MED ORDER — PREDNISONE 50 MG PO TABS
1250.0000 mg | ORAL_TABLET | Freq: Every day | ORAL | 0 refills | Status: AC
  Filled 2023-11-04: qty 45, 1d supply, fill #0
  Filled 2023-11-04: qty 5, 1d supply, fill #0
  Filled 2023-11-04 (×2): qty 50, 2d supply, fill #0
  Filled 2023-11-04 (×2): qty 45, 1d supply, fill #0

## 2023-11-04 MED ORDER — PREDNISONE 50 MG PO TABS
125.0000 mg | ORAL_TABLET | Freq: Every morning | ORAL | 0 refills | Status: DC
  Filled 2023-11-04: qty 5, 2d supply, fill #0

## 2023-11-04 NOTE — Discharge Summary (Signed)
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  This is similar to her prior presentation, and she responded well to Plex.  Neurology consulted and we are asked to admit  Patient improving with trial of steroids, continue prednisone at discharge for total of 5-day course.  Outpatient follow-up with neurology as scheduled.  Discharge Diagnoses:  Principal Problem:   Numbness and tingling of both feet Active Problems:   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HCC)   Elevated TSH   Neuropathy  Bilateral foot numbness  -current diagnosis still unclear -Neurology concerned for possible autoimmune condition -Caspr2 antibody positive -Steroid course for 5 days   Chronic ITP-platelets overall stable, no bleeding   Elevated TSH-Free T4 normal.  Recheck TSH in 2 to 3 weeks   Low B12-borderline, replaced  Discharge Instructions   Allergies as of 11/04/2023       Reactions   Nsaids Other (See Comments)   The patient was told to avoid NSAID(s) at this time- is taking Eliquis   Oxycodone Other (See Comments)   "Skin crawling"   Influenza Vaccines Other (See Comments)   Gullian-Barre syndrome   Shingrix [zoster Vac Recomb Adjuvanted] Other (See Comments)   Potentially cause of Guillain-Barre syndrome   Claritin-d [loratadine-pseudoephedrine  Er] Rash   Codeine Rash   Hydrocodone-acetaminophen Itching   Loratadine Rash   Lyrica [pregabalin] Other (See Comments)   Caused the patient to feel "foggy-headed"        Medication List     TAKE these medications    Calcium Carbonate-Vitamin D3 600-400 MG-UNIT Tabs Take 2 tablets by mouth daily.   dicyclomine 10 MG capsule Commonly known as: BENTYL Take 1 capsule (10 mg total) by mouth every 8 (eight) hours as needed for spasms. Call for appointment please.   omeprazole 40 MG capsule Commonly known as: PRILOSEC Take 1 capsule (40 mg total) by mouth daily.   polyethylene glycol powder 17 GM/SCOOP powder Commonly known as: GLYCOLAX/MIRALAX Take 17 g by mouth daily.   predniSONE 50 MG tablet Commonly known as: DELTASONE Take 25 tablets (1,250 mg total) by mouth daily with breakfast for 2 days. Start taking on: November 05, 2023   rosuvastatin 5 MG tablet Commonly known as: CRESTOR Take 1 tablet (5 mg total) by mouth 2 (two) times a week.        Follow-up Information     Antony Madura, MD. Call today.   Specialty: Neurology Why: Or Nita Sickle - whichever is available first Contact information: [address] [place] [address] [phone]                Allergies  Allergen Reactions   Nsaids Other (See Comments)    The patient was told to avoid NSAID(s) at this time- is taking Eliquis   Oxycodone Other (See Comments)    "Skin crawling"   Influenza Vaccines Other (See Comments)    Gullian-Barre syndrome  Shingrix [Zoster Vac Recomb Adjuvanted] Other (See Comments)    Potentially cause of Guillain-Barre syndrome   Claritin-D [Loratadine-Pseudoephedrine Er] Rash   Codeine Rash   Hydrocodone-Acetaminophen Itching   Loratadine Rash   Lyrica [Pregabalin] Other (See Comments)    Caused the patient to feel "foggy-headed"    Consultations: Neurology  Procedures/Studies: DG FL GUIDED LP INJECT MYELOGRAM 2+ REGION  Result  Date: 11/02/2023 CLINICAL DATA:  62 year old female. With recurrent bilateral foot paresthesia. Request is for lumbar puncture with concurrent myelogram EXAM: LUMBAR PUNCTURE UNDER FLUOROSCOPY PROCEDURE: An appropriate skin entry site was determined fluoroscopically. Operator donned sterile gloves and mask. Skin site was marked, then prepped with Betadine, draped in usual sterile fashion, and infiltrated locally with 1% lidocaine. A 20 gauge 3.5 inch spinal needle advanced into the thecal sac at L3-4 from a right interlaminar approach. Clear colorless CSF spontaneously returned, with opening pressure of 15 cm water. 14 ml CSF were collected and divided among 4 sterile vials for the requested laboratory studies. Subsequently, 20 mL of Omnipaque 180 contrast were slowly hand injected into the thecal sac under intermittent fluoroscopy. The inner stylet was replaced within the needle and the needle was removed in its entirety. A dressing was applied at the skin entry site. The patient tolerated the procedure well and no immediate post-procedure complication was apparent The needle was then removed. The patient tolerated the procedure well and there were no complications. FLUOROSCOPY: Radiation Exposure Index (as provided by the fluoroscopic device): 5.2 mGy Kerma IMPRESSION: Successful fluoroscopic-guided lumbar puncture and intrathecal contrast injection for myelogram. CT C, T and L spine myelograms will be dictated separately. This exam was performed by Anders Grant NP, and was supervised and interpreted by Dr. Roanna Banning Electronically Signed   By: Roanna Banning M.D.   On: 11/02/2023 15:12   CT CERVICAL SPINE W CONTRAST  Result Date: 11/01/2023 CLINICAL DATA:  Myelopathy, acute, cervical spine; Myelopathy, acute, thoracic spine; Myelopathy, acute, lumbar spine EXAM: CT CERVICAL, THORACIC, AND LUMBAR SPINE WITH CONTRAST TECHNIQUE: Multidetector CT imaging of the cervical, lumbar, and thoracic spine was  performed with intravenous contrast. Multiplanar CT image reconstructions were also generated. RADIATION DOSE REDUCTION: This exam was performed according to the departmental dose-optimization program which includes automated exposure control, adjustment of the mA and/or kV according to patient size and/or use of iterative reconstruction technique. CONTRAST:  15 mL of Omnipaque 180 IV. COMPARISON:  CT lumbar myelogram January 27, 2022. FINDINGS: CT CERVICAL SPINE FINDINGS Alignment: Straightening.  No substantial sagittal subluxation. Skull base and vertebrae: No evidence of acute fracture. Vertebral body heights are maintained. Soft tissues: No prevertebral edema. Disc levels: C2-C3: No significant canal or foraminal stenosis. C3-C4: No significant canal or foraminal stenosis. C4-C5: Mild disc bulging and bilateral facet and uncovertebral hypertrophy. Mild canal and mild bilateral foraminal stenosis. C5-C6: Small posterior disc osteophyte complex and bilateral facet and uncovertebral hypertrophy. Resulting mild canal stenosis with mild-to-moderate bilateral foraminal stenosis. C6-C7: Posterior disc osteophyte complex and bilateral facet and uncovertebral hypertrophy. Resulting mild canal stenosis with moderate left foraminal stenosis. C7-T1: No significant canal or foraminal stenosis. Upper chest: Visualized lung apices are clear. CT THORACIC SPINE FINDINGS Alignment: No substantial sagittal subluxation. Mild reverse S-shaped thoracic curvature. Vertebrae: Vertebral body heights are maintained. No evidence of acute fracture. Paraspinal and other soft tissues: No acute findings. Disc levels: No significant canal or foraminal stenosis. CT LUMBAR SPINE FINDINGS Segmentation: When counting from the top there is transitional anatomy with a lumbarized S1 vertebral  body. Alignment: No substantial sagittal subluxation. Conus and canal: Conus terminates at L2-L3. Previously seen thickening and nodularity of the cauda  equina nerve roots appears improved (particularly when viewed on reformatted imaging) with nerve roots now mildly enlarged in size. Small locule of gas in the canal is likely postoperative given intrathecal injection of contrast for this procedure. Vertebrae: No acute fracture. Paraspinal and other soft tissues: Unremarkable. Disc levels: T12-L1: No significant disc protrusion, foraminal stenosis, or canal stenosis. L1-L2: No significant disc protrusion, foraminal stenosis, or canal stenosis. L2-L3: No significant disc protrusion, foraminal stenosis, or canal stenosis. L3-L4: No significant disc protrusion, foraminal stenosis, or canal stenosis. L4-L5: Mild disc bulging. Mild facet arthropathy. No significant canal or foraminal stenosis. L5-S1: Mild facet arthropathy. No significant canal stenosis. Mild right greater than left foraminal stenosis, similar versus mildly progressed. IMPRESSION: 1. When counting from the top there is transitional anatomy with a lumbarized S1 vertebral body. Correlation with radiographs is recommended prior to any operative intervention. 2. Previously seen thickening and nodularity of the cauda equina nerve roots appears improved (particularly when viewed on reformatted imaging) with nerve roots mildly enlarged in size. 3. 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 Multilevel mild foraminal stenosis is detailed above. 4. Mild canal stenosis at C4-C5, C5-C6, and C6-C7. Electronically Signed   By: Feliberto Harts M.D.   On: 11/01/2023 16:00   CT THORACIC SPINE W CONTRAST  Result Date: 11/01/2023 CLINICAL DATA:  Myelopathy, acute, cervical spine; Myelopathy, acute, thoracic spine; Myelopathy, acute, lumbar spine EXAM: CT CERVICAL, THORACIC, AND LUMBAR SPINE WITH CONTRAST TECHNIQUE: Multidetector CT imaging of the cervical, lumbar, and thoracic spine was performed with intravenous contrast. Multiplanar CT image reconstructions were also generated. RADIATION DOSE REDUCTION: This exam  was performed according to the departmental dose-optimization program which includes automated exposure control, adjustment of the mA and/or kV according to patient size and/or use of iterative reconstruction technique. CONTRAST:  15 mL of Omnipaque 180 IV. COMPARISON:  CT lumbar myelogram January 27, 2022. FINDINGS: CT CERVICAL SPINE FINDINGS Alignment: Straightening.  No substantial sagittal subluxation. Skull base and vertebrae: No evidence of acute fracture. Vertebral body heights are maintained. Soft tissues: No prevertebral edema. Disc levels: C2-C3: No significant canal or foraminal stenosis. C3-C4: No significant canal or foraminal stenosis. C4-C5: Mild disc bulging and bilateral facet and uncovertebral hypertrophy. Mild canal and mild bilateral foraminal stenosis. C5-C6: Small posterior disc osteophyte complex and bilateral facet and uncovertebral hypertrophy. Resulting mild canal stenosis with mild-to-moderate bilateral foraminal stenosis. C6-C7: Posterior disc osteophyte complex and bilateral facet and uncovertebral hypertrophy. Resulting mild canal stenosis with moderate left foraminal stenosis. C7-T1: No significant canal or foraminal stenosis. Upper chest: Visualized lung apices are clear. CT THORACIC SPINE FINDINGS Alignment: No substantial sagittal subluxation. Mild reverse S-shaped thoracic curvature. Vertebrae: Vertebral body heights are maintained. No evidence of acute fracture. Paraspinal and other soft tissues: No acute findings. Disc levels: No significant canal or foraminal stenosis. CT LUMBAR SPINE FINDINGS Segmentation: When counting from the top there is transitional anatomy with a lumbarized S1 vertebral body. Alignment: No substantial sagittal subluxation. Conus and canal: Conus terminates at L2-L3. Previously seen thickening and nodularity of the cauda equina nerve roots appears improved (particularly when viewed on reformatted imaging) with nerve roots now mildly enlarged in size.  Small locule of gas in the canal is likely postoperative given intrathecal injection of contrast for this procedure. Vertebrae: No acute fracture. Paraspinal and other soft tissues: Unremarkable. Disc levels: T12-L1: No significant disc protrusion, foraminal stenosis,  or canal stenosis. L1-L2: No significant disc protrusion, foraminal stenosis, or canal stenosis. L2-L3: No significant disc protrusion, foraminal stenosis, or canal stenosis. L3-L4: No significant disc protrusion, foraminal stenosis, or canal stenosis. L4-L5: Mild disc bulging. Mild facet arthropathy. No significant canal or foraminal stenosis. L5-S1: Mild facet arthropathy. No significant canal stenosis. Mild right greater than left foraminal stenosis, similar versus mildly progressed. IMPRESSION: 1. When counting from the top there is transitional anatomy with a lumbarized S1 vertebral body. Correlation with radiographs is recommended prior to any operative intervention. 2. Previously seen thickening and nodularity of the cauda equina nerve roots appears improved (particularly when viewed on reformatted imaging) with nerve roots mildly enlarged in size. 3. 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 Multilevel mild foraminal stenosis is detailed above. 4. Mild canal stenosis at C4-C5, C5-C6, and C6-C7. Electronically Signed   By: Feliberto Harts M.D.   On: 11/01/2023 16:00   CT LUMBAR SPINE W CONTRAST  Result Date: 11/01/2023 CLINICAL DATA:  Myelopathy, acute, cervical spine; Myelopathy, acute, thoracic spine; Myelopathy, acute, lumbar spine EXAM: CT CERVICAL, THORACIC, AND LUMBAR SPINE WITH CONTRAST TECHNIQUE: Multidetector CT imaging of the cervical, lumbar, and thoracic spine was performed with intravenous contrast. Multiplanar CT image reconstructions were also generated. RADIATION DOSE REDUCTION: This exam was performed according to the departmental dose-optimization program which includes automated exposure control, adjustment of the mA  and/or kV according to patient size and/or use of iterative reconstruction technique. CONTRAST:  15 mL of Omnipaque 180 IV. COMPARISON:  CT lumbar myelogram January 27, 2022. FINDINGS: CT CERVICAL SPINE FINDINGS Alignment: Straightening.  No substantial sagittal subluxation. Skull base and vertebrae: No evidence of acute fracture. Vertebral body heights are maintained. Soft tissues: No prevertebral edema. Disc levels: C2-C3: No significant canal or foraminal stenosis. C3-C4: No significant canal or foraminal stenosis. C4-C5: Mild disc bulging and bilateral facet and uncovertebral hypertrophy. Mild canal and mild bilateral foraminal stenosis. C5-C6: Small posterior disc osteophyte complex and bilateral facet and uncovertebral hypertrophy. Resulting mild canal stenosis with mild-to-moderate bilateral foraminal stenosis. C6-C7: Posterior disc osteophyte complex and bilateral facet and uncovertebral hypertrophy. Resulting mild canal stenosis with moderate left foraminal stenosis. C7-T1: No significant canal or foraminal stenosis. Upper chest: Visualized lung apices are clear. CT THORACIC SPINE FINDINGS Alignment: No substantial sagittal subluxation. Mild reverse S-shaped thoracic curvature. Vertebrae: Vertebral body heights are maintained. No evidence of acute fracture. Paraspinal and other soft tissues: No acute findings. Disc levels: No significant canal or foraminal stenosis. CT LUMBAR SPINE FINDINGS Segmentation: When counting from the top there is transitional anatomy with a lumbarized S1 vertebral body. Alignment: No substantial sagittal subluxation. Conus and canal: Conus terminates at L2-L3. Previously seen thickening and nodularity of the cauda equina nerve roots appears improved (particularly when viewed on reformatted imaging) with nerve roots now mildly enlarged in size. Small locule of gas in the canal is likely postoperative given intrathecal injection of contrast for this procedure. Vertebrae: No acute  fracture. Paraspinal and other soft tissues: Unremarkable. Disc levels: T12-L1: No significant disc protrusion, foraminal stenosis, or canal stenosis. L1-L2: No significant disc protrusion, foraminal stenosis, or canal stenosis. L2-L3: No significant disc protrusion, foraminal stenosis, or canal stenosis. L3-L4: No significant disc protrusion, foraminal stenosis, or canal stenosis. L4-L5: Mild disc bulging. Mild facet arthropathy. No significant canal or foraminal stenosis. L5-S1: Mild facet arthropathy. No significant canal stenosis. Mild right greater than left foraminal stenosis, similar versus mildly progressed. IMPRESSION: 1. When counting from the top there is transitional anatomy with a  lumbarized S1 vertebral body. Correlation with radiographs is recommended prior to any operative intervention. 2. Previously seen thickening and nodularity of the cauda equina nerve roots appears improved (particularly when viewed on reformatted imaging) with nerve roots mildly enlarged in size. 3. 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 Multilevel mild foraminal stenosis is detailed above. 4. Mild canal stenosis at C4-C5, C5-C6, and C6-C7. Electronically Signed   By: Feliberto Harts M.D.   On: 11/01/2023 16:00   CT Thoracic Spine Wo Contrast  Result Date: 11/01/2023 CLINICAL DATA:  This study identified as missing a report at 11:32 am on 11/01/2023. 62 year old female with numbness.  "Demyelinating disease". EXAM: CT THORACIC SPINE WITHOUT CONTRAST TECHNIQUE: Multidetector CT images of the thoracic were obtained using the standard protocol without intravenous contrast. RADIATION DOSE REDUCTION: This exam was performed according to the departmental dose-optimization program which includes automated exposure control, adjustment of the mA and/or kV according to patient size and/or use of iterative reconstruction technique. COMPARISON:  CT cervical spine from the same time reported separately. Previous CT Chest,  Abdomen, and Pelvis 02/14/2022. FINDINGS: Limited cervical spine imaging:  Reported separately. Thoracic spine segmentation:  Normal. Alignment: Stable from the chest CT last year. Mild reverse S-shaped thoracic scoliosis with mild straightening of thoracic kyphosis. Vertebrae: Maintained thoracic vertebral body height. Background bone mineralization within normal limits. No acute osseous abnormality identified. Paraspinal and other soft tissues: Stable lungs aside from symmetric mild dependent atelectasis. Visible major airways are patent. No evidence of pericardial or pleural effusion. No evidence of mediastinal mass or lymphadenopathy. Chronic cholecystectomy. Negative visible other noncontrast upper abdominal viscera. Negative thoracic paraspinal soft tissues. Disc levels: Mild for age thoracic spine degeneration despite mild scoliosis. Chronic disc and endplate degeneration most pronounced match that chronic disc and endplate degeneration most pronounced at T10-T11 and T11-T12 appears stable by CT. No CT evidence of thoracic spinal stenosis. IMPRESSION: 1. No acute osseous abnormality in the Thoracic Spine. Mild chronic thoracic scoliosis. 2. Mild for age thoracic spine degeneration, maximal at T10-T11 and T11-T12. No CT evidence of thoracic spinal stenosis. 3. CT Cervical Spine reported separately. Electronically Signed   By: Odessa Fleming M.D.   On: 11/01/2023 11:38   CT CERVICAL SPINE WO CONTRAST  Result Date: 11/01/2023 CLINICAL DATA:  Numbness, pain and feet EXAM: CT CERVICAL SPINE WITHOUT CONTRAST TECHNIQUE: Multidetector CT imaging of the cervical spine was performed without intravenous contrast. Multiplanar CT image reconstructions were also generated. RADIATION DOSE REDUCTION: This exam was performed according to the departmental dose-optimization program which includes automated exposure control, adjustment of the mA and/or kV according to patient size and/or use of iterative reconstruction technique.  COMPARISON:  None Available. FINDINGS: Alignment: Normal Skull base and vertebrae: No acute fracture. No primary bone lesion or focal pathologic process. Soft tissues and spinal canal: No prevertebral fluid or swelling. No visible canal hematoma. Disc levels: Mild disc space narrowing and spurring in the lower cervical spine. No central spinal stenosis or neuroforaminal narrowing. Upper chest: No acute findings Other: None IMPRESSION: No acute bony abnormality. Electronically Signed   By: Charlett Nose M.D.   On: 11/01/2023 00:13   CT Head Wo Contrast  Result Date: 11/01/2023 CLINICAL DATA:  Neuro deficit, acute, stroke suspected. Numbness and pain in bilateral feet. EXAM: CT HEAD WITHOUT CONTRAST TECHNIQUE: Contiguous axial images were obtained from the base of the skull through the vertex without intravenous contrast. RADIATION DOSE REDUCTION: This exam was performed according to the departmental dose-optimization program which includes automated  exposure control, adjustment of the mA and/or kV according to patient size and/or use of iterative reconstruction technique. 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 No visible acute intracranial abnormality. Specifically, no hemorrhage, hydrocephalus, mass lesion, acute infarction, or significant intracranial injury. Vascular: No hyperdense vessel or unexpected calcification. Skull: No acute calvarial abnormality. Sinuses/Orbits: No acute findings Other: None. IMPRESSION: Within the limitations of the study with the artifact from the cochlear implant, no acute intracranial abnormality. Electronically Signed   By: Charlett Nose M.D.   On: 11/01/2023 00:12     Subjective: No acute issues or events overnight   Discharge Exam: Vitals:   11/04/23 0717 11/04/23 1112  BP: 132/69 (!) 112/57  Pulse: (!) 54 61  Resp: 14 14  Temp: 98.1 F (36.7 C) 99 F (37.2 C)  SpO2: 98% 99%   Vitals:   11/03/23  2300 11/04/23 0327 11/04/23 0717 11/04/23 1112  BP: 108/69 111/63 132/69 (!) 112/57  Pulse: 60 67 (!) 54 61  Resp: 16 16 14 14   Temp: 98.1 F (36.7 C) 98 F (36.7 C) 98.1 F (36.7 C) 99 F (37.2 C)  TempSrc: Oral Oral Oral Oral  SpO2: 96% 95% 98% 99%  Weight:      Height:        General: Pt is alert, awake, not in acute distress Cardiovascular: RRR, S1/S2 +, no rubs, no gallops Respiratory: CTA bilaterally, no wheezing, no rhonchi Abdominal: Soft, NT, ND, bowel sounds + Extremities: no edema, no cyanosis    The results of significant diagnostics from this hospitalization (including imaging, microbiology, ancillary and laboratory) are listed below for reference.     Microbiology: Recent Results (from the past 240 hour(s))  CSF culture w Gram Stain     Status: None   Collection Time: 10/31/23 11:36 PM   Specimen: CSF; Cerebrospinal Fluid  Result Value Ref Range Status   Specimen Description CSF  Final   Special Requests NONE  Final   Gram Stain   Final    WBC PRESENT, PREDOMINANTLY MONONUCLEAR NO ORGANISMS SEEN CYTOSPIN SMEAR    Culture   Final    NO GROWTH 3 DAYS Performed at [facility], [address], [place], [place] [address]    Report Status 11/04/2023 FINAL  Final     Labs: BNP (last 3 results) No results for input(s): "BNP" in the last 8760 hours. Basic Metabolic Panel: Recent Labs  Lab 10/31/23 2121 11/01/23 0440 11/02/23 0621 11/03/23 0516  NA 136 138 140 135  K 4.0 4.1 4.7 4.2  CL 105 106 106 104  CO2 23 26 27 23   GLUCOSE 98 100* 106* 144*  BUN 19 14 10 13   CREATININE 0.74 0.77 0.66 0.60  CALCIUM 9.5 9.3 9.4 9.3  MG 2.1  --  2.0  --    Liver Function Tests: Recent Labs  Lab 10/31/23 2121 11/02/23 0621  AST 17 15  ALT 12 11  ALKPHOS 35* 38  BILITOT 0.9 0.8  PROT 6.7 6.2*  ALBUMIN 4.1 3.6   No results for input(s): "LIPASE", "AMYLASE" in the last 168 hours. No results for input(s): "AMMONIA" in the last 168  hours. CBC: Recent Labs  Lab 10/31/23 2121 11/01/23 0440 11/02/23 0621 11/03/23 0516  WBC 5.3 4.4 4.0 9.5  NEUTROABS 2.1  --   --   --   HGB 12.2 12.0 13.1 13.0  HCT 36.4 35.5* 38.9 38.1  MCV 94.8 93.9 94.9 93.8  PLT 106* 92* 111* 113*   Cardiac Enzymes: No results for input(s): "CKTOTAL", "CKMB", "CKMBINDEX", "TROPONINI" in the last 168 hours. BNP: Invalid input(s): "POCBNP" CBG: No results for input(s): "GLUCAP" in the last 168 hours. D-Dimer No results for input(s): "DDIMER" in the last 72 hours. Hgb A1c No results for input(s): "HGBA1C" in the last 72 hours. Lipid Profile No results for input(s): "CHOL", "HDL", "LDLCALC", "TRIG", "CHOLHDL", "LDLDIRECT" in the last 72 hours. Thyroid function studies No results for input(s): "TSH", "T4TOTAL", "T3FREE", "THYROIDAB" in the last 72 hours.  Invalid input(s): "FREET3" Anemia work up No results for input(s): "VITAMINB12", "FOLATE", "FERRITIN", "TIBC", "IRON", "RETICCTPCT" in the last 72 hours. Urinalysis    Component Value Date/Time   COLORURINE YELLOW 02/19/2022 0545   APPEARANCEUR HAZY (A) 02/19/2022 0545   LABSPEC 1.027 02/19/2022 0545   PHURINE 5.0 02/19/2022 0545   GLUCOSEU NEGATIVE 02/19/2022 0545   HGBUR NEGATIVE 02/19/2022 0545   BILIRUBINUR NEGATIVE 02/19/2022 0545   KETONESUR NEGATIVE 02/19/2022 0545   PROTEINUR NEGATIVE 02/19/2022 0545   UROBILINOGEN 1.0 02/18/2009 2207   NITRITE POSITIVE (A) 02/19/2022 0545   LEUKOCYTESUR MODERATE (A) 02/19/2022 0545   Sepsis Labs Recent Labs  Lab 10/31/23 2121 11/01/23 0440 11/02/23 0621 11/03/23 0516  WBC 5.3 4.4 4.0 9.5   Microbiology Recent Results (from the past 240 hour(s))  CSF culture w Gram Stain     Status: None   Collection Time: 10/31/23 11:36 PM   Specimen: CSF; Cerebrospinal Fluid  Result Value Ref Range Status   Specimen Description CSF  Final   Special Requests NONE  Final   Gram Stain   Final    WBC PRESENT, PREDOMINANTLY MONONUCLEAR NO  ORGANISMS SEEN CYTOSPIN SMEAR    Culture   Final    NO GROWTH 3 DAYS Performed at [facility], [address], [place], [place] [address]    Report Status 11/04/2023 FINAL  Final     Time coordinating discharge: Over 30 minutes  SIGNED:   Azucena Fallen, DO [facility] 11/04/2023, 1:36 PM Pager   If 7PM-7AM, please contact night-coverage [email or web address]

## 2023-11-07 ENCOUNTER — Other Ambulatory Visit (HOSPITAL_COMMUNITY): Payer: Self-pay

## 2023-11-08 ENCOUNTER — Other Ambulatory Visit (HOSPITAL_COMMUNITY): Payer: Self-pay

## 2023-11-08 DIAGNOSIS — Z6826 Body mass index (BMI) 26.0-26.9, adult: Secondary | ICD-10-CM | POA: Diagnosis not present

## 2023-11-08 DIAGNOSIS — Z1231 Encounter for screening mammogram for malignant neoplasm of breast: Secondary | ICD-10-CM | POA: Diagnosis not present

## 2023-11-08 DIAGNOSIS — Z01419 Encounter for gynecological examination (general) (routine) without abnormal findings: Secondary | ICD-10-CM | POA: Diagnosis not present

## 2023-11-08 DIAGNOSIS — Z1330 Encounter for screening examination for mental health and behavioral disorders, unspecified: Secondary | ICD-10-CM | POA: Diagnosis not present

## 2023-11-08 DIAGNOSIS — K562 Volvulus: Secondary | ICD-10-CM | POA: Diagnosis not present

## 2023-11-08 DIAGNOSIS — G0481 Other encephalitis and encephalomyelitis: Secondary | ICD-10-CM | POA: Diagnosis not present

## 2023-11-08 DIAGNOSIS — M8580 Other specified disorders of bone density and structure, unspecified site: Secondary | ICD-10-CM | POA: Diagnosis not present

## 2023-11-08 LAB — MISC LABCORP TEST (SEND OUT)

## 2023-11-09 ENCOUNTER — Encounter: Payer: Self-pay | Admitting: Neurology

## 2023-11-13 ENCOUNTER — Other Ambulatory Visit: Payer: Self-pay | Admitting: Neurology

## 2023-11-13 DIAGNOSIS — G7119 Other specified myotonic disorders: Secondary | ICD-10-CM

## 2023-11-14 ENCOUNTER — Encounter: Payer: Self-pay | Admitting: Neurology

## 2023-11-14 ENCOUNTER — Telehealth: Payer: Self-pay | Admitting: Neurology

## 2023-11-14 NOTE — Telephone Encounter (Signed)
See other phone message  

## 2023-11-14 NOTE — Telephone Encounter (Signed)
Referral  for neurology fax to [facility] as requested by Neurology. Phone: [phone], Fax: [phone].

## 2023-11-14 NOTE — Telephone Encounter (Signed)
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 Thank you Dr. Loleta Chance [facility]!!   Pod 4: woul dyou send patient to [facility] fto meuromuscular expert please? Discuss with her, this is at an academic level at this time. If she agrees, send referral. Thanks ----- Message ----- From: Antony Madura, MD Sent: 11/13/2023   5:08 PM EST To: Anson Fret, MD Subject: RE: possible autoimmune condition -Caspr2 an#  Thank you for the message. I certainly know of you despite not having the pleasure to meet yet.   Regarding this patient, I'm not sure what to make of the case. I see the positive caspr2 in Feb but then a negative test in 10/2023 in serum and CSF. The clumping or enlarging of the nerve roots is the pathology that is clear. This is the type of patient I would probably recommend going to an academic center as I've seen only one caspr2 patient (in residency), but that was in the autoimmune/MS center with encephalitis. So if that is the question, I am not sure I would have enough knowledge or experience to know how to interpret or treat better than she already has been.  Thank you again for the message. It is an interesting case for sure.  Othelia Pulling ----- Message ----- From: Anson Fret, MD Sent: 11/13/2023  10:48 AM EST To: Antony Madura, MD Subject: possible autoimmune condition -Caspr2 antibo#  Hi, we have not met this is Christine Brady from [facility] neurology. Welcome! Hear wonderful things about you and of course I know your wife works with [employer], a friend of mine, hope you like [place]. I was wondering if you would not mind seeing a patient for possibly a second opinion(or transfer of care if you think best) as I think she needs a neuromuscular specialist. 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 I am not a neuromuscular specialist, if you are not comfortable seeing or think she need [facility] then completely understand thanks ! Christine Brady ([place] [facility])

## 2023-11-14 NOTE — Telephone Encounter (Signed)
I called pt and relayed that per Dr. Lucia Gaskins that she wanted [place] to reach out  and relay that she wanted her to see a neuromuscular specialist (expert) that she would recommend [facility] (academic center ) to refer her to regarding Caspr2.   If this is ok with pt then would place referral.  Pt said that she has appt with Dr. Allena Katz with [facility] for evaluation.  I relayed that Dr. Lucia Gaskins did not mention this, but did ask another MD to evaluate and they deferred to academic center as well (Caspr2).  Pt will see Dr. Allena Katz.  I relayed that will send message back to Dr. Lucia Gaskins if any thing more to add would let pt know.  She verbalized understanding.

## 2023-11-14 NOTE — Telephone Encounter (Signed)
No, Dr. Hunt Oris is a neuromuscular expert so glad she is eeing her!! I will mychart her

## 2023-11-23 ENCOUNTER — Ambulatory Visit (INDEPENDENT_AMBULATORY_CARE_PROVIDER_SITE_OTHER): Payer: 59 | Admitting: Neurology

## 2023-11-23 ENCOUNTER — Encounter: Payer: Self-pay | Admitting: Neurology

## 2023-11-23 ENCOUNTER — Other Ambulatory Visit (HOSPITAL_COMMUNITY): Payer: Self-pay

## 2023-11-23 VITALS — BP 108/75 | HR 73 | Ht 62.0 in | Wt 140.0 lb

## 2023-11-23 DIAGNOSIS — G6289 Other specified polyneuropathies: Secondary | ICD-10-CM

## 2023-11-23 DIAGNOSIS — E538 Deficiency of other specified B group vitamins: Secondary | ICD-10-CM | POA: Diagnosis not present

## 2023-11-23 DIAGNOSIS — G6181 Chronic inflammatory demyelinating polyneuritis: Secondary | ICD-10-CM | POA: Diagnosis not present

## 2023-11-23 MED ORDER — SYRINGE/NEEDLE (DISP) 21G X 1" 3 ML MISC
0 refills | Status: AC
  Filled 2023-11-23: qty 10, 28d supply, fill #0

## 2023-11-23 MED ORDER — NEEDLE (DISP) 25G X 1-1/2" MISC
0 refills | Status: AC
  Filled 2023-11-23: qty 10, 28d supply, fill #0

## 2023-11-23 MED ORDER — CYANOCOBALAMIN 1000 MCG/ML IJ SOLN
INTRAMUSCULAR | 1 refills | Status: AC
  Filled 2023-11-23: qty 10, 28d supply, fill #0
  Filled 2023-12-16: qty 10, 28d supply, fill #1

## 2023-11-23 NOTE — Patient Instructions (Addendum)
We will work getting Vyvgart Hytrulo infusions Nerve testing of the right arm and leg Start vitamin B12 IM injection daily x 7 days, weekly x 4 weeks, then monthly thereafter x 1 year.  ELECTROMYOGRAM AND NERVE CONDUCTION STUDIES (EMG/NCS) INSTRUCTIONS  How to Prepare The neurologist conducting the EMG will need to know if you have certain medical conditions. Tell the neurologist and other EMG lab personnel if you: Have a pacemaker or any other electrical medical device Take blood-thinning medications Have hemophilia, a blood-clotting disorder that causes prolonged bleeding Bathing Take a shower or bath shortly before your exam in order to remove oils from your skin. Don't apply lotions or creams before the exam.  What to Expect You'll likely be asked to change into a hospital gown for the procedure and lie down on an examination table. The following explanations can help you understand what will happen during the exam.  Electrodes. The neurologist or a technician places surface electrodes at various locations on your skin depending on where you're experiencing symptoms. Or the neurologist may insert needle electrodes at different sites depending on your symptoms.  Sensations. The electrodes will at times transmit a tiny electrical current that you may feel as a twinge or spasm. The needle electrode may cause discomfort or pain that usually ends shortly after the needle is removed. If you are concerned about discomfort or pain, you may want to talk to the neurologist about taking a short break during the exam.  Instructions. During the needle EMG, the neurologist will assess whether there is any spontaneous electrical activity when the muscle is at rest - activity that isn't present in healthy muscle tissue - and the degree of activity when you slightly contract the muscle.  He or she will give you instructions on resting and contracting a muscle at appropriate times. Depending on what  muscles and nerves the neurologist is examining, he or she may ask you to change positions during the exam.  After your EMG You may experience some temporary, minor bruising where the needle electrode was inserted into your muscle. This bruising should fade within several days. If it persists, contact your primary care doctor.

## 2023-11-23 NOTE — Progress Notes (Signed)
[facility] Neurology Division Clinic Note - Initial Visit   Date: 11/23/2023   Christine Brady MRN: [id number] DOB: [birth year]   Dear Dr. Natale Milch:  Thank you for your kind referral of Christine Brady for consultation of CASPR2 antibody. Although her history is well known to you, please allow [place] to reiterate it for the purpose of our medical record. The patient was accompanied to the clinic by self.    Christine Brady is a 62 y.o. 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   IMPRESSION/PLAN: Chronic inflammatory demyelinating polyradiculoneuropathy, presenting with ascending paresthesias and weakness in 2023 improved with PLEX (CSF pritein 109, WBC 18) and again in 2024, which was treated with corticosteroids. Most of her paresthesias in the legs has improved, however she continues to have tingling/numbness in the face and feet.  I discussed management options to include monthly IVMP or Vyvgart Hytrulo, she prefers the latter given side effect profile of chronic steroid use.  She will also return for NCS/EMG of the right arm and leg.   CASPR2 antibody testing can present with neuropathic pain, neuropathy, and polyradiculoneuropathy.  She may have hyperexcitability (cramps).  No history of seizures, ataxia, recurrent fever, or psychiatric conditions.  Hopefully, her neuropathy will response to Vyvgart Hytrulo.   Low vitamin B12.  She will start vitamin B12 injections at home.    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  CSF testing shows WBC 18 and protein 109.  Imaging of the lumbar spine suggesting clumping of there nerve roots on myelogram.  She was treated with  plasmaphereisis due to concern of GBS.  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  IVIG was not an option due to hypercoagulability with ITP. Her symptoms improved over several months and she was able to walk independently again.  Since this time, she has severe cramps in the legs which are bothersome at night time.  No history of seizures or altered mentation.  She was doing well until early November, when she began having sensation of "clumpy" in the feet and leg weakness.  She also has tingling sensation around the head and face.   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  She was discharged with instructions to take prednisone 1250 mg x 2 days.  A few days after stopping steroids, she reports having increased numbness in the feet and clumpying sensation in the feet. She also have tingling sensation around the face, ears, and scalp.  Weakness has improved.    Out-side paper records, electronic medical record, and images have been reviewed where available and summarized as:  Lab Results  Component Value Date   HGBA1C 5.3 11/01/2023   Lab Results  Component Value Date   VITAMINB12 212 11/01/2023   Lab Results  Component Value Date   TSH 8.449 (H) 11/01/2023   Lab Results  Component Value Date   ESRSEDRATE 8 02/09/2022    Past Medical History:  Diagnosis Date   Abdominal pain    Arthritis    difficulty walking   Atypical chest pain 11/11/2016   Bowel obstruction (HCC)    Cholesteatoma of right ear    Chronic headaches  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HCC) 11/11/2016   Cyst    back   Hearing aid worn    History of ITP    Hyperlipidemia 11/11/2016   Large ovary 07/09/2019   PONV (postoperative nausea and vomiting)    Reflux     Past Surgical History:  Procedure Laterality Date   CHOLECYSTECTOMY  2010   ESOPHAGOGASTRODUODENOSCOPY (EGD) WITH PROPOFOL N/A  06/12/2019   Procedure: ESOPHAGOGASTRODUODENOSCOPY (EGD) WITH PROPOFOL;  Surgeon: Jeani Hawking, MD;  Location: [facility];  Service: Endoscopy;  Laterality: N/A;   EXTERNAL EAR SURGERY  2009/2010   EXTERNAL EAR SURGERY  2010   right ear   IR FLUORO GUIDE CV LINE RIGHT  02/10/2022   IR US GUIDE VASC ACCESS RIGHT  02/10/2022   KNEE ARTHROSCOPY     right   KNEE ARTHROSCOPY WITH MEDIAL PATELLAR FEMORAL LIGAMENT RECONSTRUCTION Right 10/05/2021   Procedure: right knee arthroscoy, debridement, lateral menisectomy, medial patellar femoral ligament reconstruction;  Surgeon: Cammy Copa, MD;  Location: [facility];  Service: Orthopedics;  Laterality: Right;   KNEE LIGAMENT RECONSTRUCTION     left   MASTOIDECTOMY Right 01/19/2017   Procedure: RIGHT MODIFIED RADICAL MASTOIDECTOMY;  Surgeon: Ermalinda Barrios, MD;  Location: [facility];  Service: ENT;  Laterality: Right;   UPPER ESOPHAGEAL ENDOSCOPIC ULTRASOUND (EUS) N/A 06/12/2019   Procedure: UPPER ESOPHAGEAL ENDOSCOPIC ULTRASOUND (EUS);  Surgeon: Jeani Hawking, MD;  Location: [facility];  Service: Endoscopy;  Laterality: N/A;   WISDOM TOOTH EXTRACTION       Medications:  Outpatient Encounter Medications as of 11/23/2023  Medication Sig Note   Calcium Carbonate-Vitamin D3 600-400 MG-UNIT TABS Take 2 tablets by mouth daily.    dicyclomine (BENTYL) 10 MG capsule Take 1 capsule (10 mg total) by mouth every 8 (eight) hours as needed for spasms. Call for appointment please.    omeprazole (PRILOSEC) 40 MG capsule Take 1 capsule (40 mg total) by mouth daily.    polyethylene glycol powder (GLYCOLAX/MIRALAX) 17 GM/SCOOP powder Take 17 g by mouth daily.    rosuvastatin (CRESTOR) 5 MG tablet Take 1 tablet (5 mg total) by mouth 2 (two) times a week. 10/31/2023: Wednesday and Saturday   No facility-administered encounter medications on file as of 11/23/2023.    Allergies:  Allergies  Allergen Reactions   Nsaids Other (See  Comments)    The patient was told to avoid NSAID(s) at this time- is taking Eliquis   Oxycodone Other (See Comments)    "Skin crawling"   Influenza Vaccines Other (See Comments)    Gullian-Barre syndrome   Shingrix [Zoster Vac Recomb Adjuvanted] Other (See Comments)    Potentially cause of Guillain-Barre syndrome   Claritin-D [Loratadine-Pseudoephedrine Er] Rash   Codeine Rash   Hydrocodone-Acetaminophen Itching   Loratadine Rash   Lyrica [Pregabalin] Other (See Comments)    Caused the patient to feel "foggy-headed"    Family History: Family History  Problem Relation Age of Onset   Diabetes Mother    Asthma Mother    Diabetes type II Mother    Alcohol abuse Mother    Hypertension Mother    Diabetes Father    Hypertension Father    Kidney disease Father    Diabetes type II Father    Alcohol abuse Father    Crohn's disease Sister    COPD Sister    Thrombocytopenia Sister    Cerebral palsy Brother    Neuropathy Neg Hx     Social History: Social History  Tobacco Use   Smoking status: Never   Smokeless tobacco: Never  Vaping Use   Vaping status: Never Used  Substance Use Topics   Alcohol use: Yes    Comment: occasasional   Drug use: No   Social History   Social History Narrative   Are you right handed or left handed? Right handed    Are you currently employed ? [employer]    What is your current occupation?    Do you live at home alone? Yes   Who lives with you?    What type of home do you live in: 1 story or 2 story? Lives in a one story home.         Vital Signs:  BP 108/75   Pulse 73   Ht 5\' 2"  (1.575 m)   Wt 140 lb (63.5 kg)   SpO2 99%   BMI 25.61 kg/m   Neurological Exam: MENTAL STATUS including orientation to time, place, person, recent and remote memory, attention span and concentration, language, and fund of knowledge is normal.  Speech is not dysarthric.  CRANIAL NERVES: II:  No visual field defects.     III-IV-VI:  Pupils equal round and reactive to light.  Normal conjugate, extra-ocular eye movements in all directions of gaze.  No nystagmus.  No ptosis.   V:  Normal facial sensation.    VII:  Normal facial symmetry and movements.   VIII:  Normal hearing and vestibular function.   IX-X:  Normal palatal movement.   XI:  Normal shoulder shrug and head rotation.   XII:  Normal tongue strength and range of motion, no deviation or fasciculation.  MOTOR:  No atrophy, fasciculations or abnormal movements.  No pronator drift.   Upper Extremity:  Right  Left  Deltoid  5/5   5/5   Biceps  5/5   5/5   Triceps  5/5   5/5   Wrist extensors  5/5   5/5   Wrist flexors  5/5   5/5   Finger extensors  5/5   5/5   Finger flexors  5/5   5/5   Dorsal interossei  5/5   5/5   Abductor pollicis  5/5   5/5   Tone (Ashworth scale)  0  0   Lower Extremity:  Right  Left  Hip flexors  5/5   5/5   Knee flexors  5/5   5/5   Knee extensors  5/5   5/5   Dorsiflexors  5/5   5/5   Plantarflexors  5/5   5/5   Toe extensors  5/5   5/5   Toe flexors  5/5   5/5   Tone (Ashworth scale)  0  0   MSRs:                                           Right        Left brachioradialis 2+  2+  biceps 2+  2+  triceps 2+  2+  patellar 2+  2+  ankle jerk 2+  2+  Hoffman no  no  plantar response down  down   SENSORY:  Vibration reduced to 80% at the great toe bilaterally, intact above the ankles.  Pin prick and temperature intact throughout.  Romberg's sign absent.   COORDINATION/GAIT: Normal finger-to- nose-finger.  Intact rapid alternating movements  bilaterally.  Able to rise from a chair without using arms.  Gait narrow based and stable. Tandem and stressed gait intact.   Total time spent reviewing records, interview, history/exam, documentation, and coordination of care on day of encounter:  60 min    Thank you for allowing me to participate in patient's care.  If I can answer any additional questions, I would be pleased to do  so.    Sincerely,    Christyna Letendre K. Allena Katz, DO

## 2023-11-24 ENCOUNTER — Other Ambulatory Visit: Payer: Self-pay | Admitting: Pharmacy Technician

## 2023-11-24 DIAGNOSIS — G6181 Chronic inflammatory demyelinating polyneuritis: Secondary | ICD-10-CM | POA: Insufficient documentation

## 2023-12-01 ENCOUNTER — Inpatient Hospital Stay: Payer: 59

## 2023-12-01 ENCOUNTER — Inpatient Hospital Stay: Payer: 59 | Admitting: Oncology

## 2023-12-01 ENCOUNTER — Other Ambulatory Visit: Payer: 59

## 2023-12-20 ENCOUNTER — Other Ambulatory Visit (HOSPITAL_COMMUNITY): Payer: Self-pay

## 2023-12-23 ENCOUNTER — Telehealth: Payer: Self-pay

## 2023-12-23 NOTE — Telephone Encounter (Signed)
 Auth Submission: DENIED Site of care: Site of care: [facility] Payer: [employer] Medication & CPT/J Code(s) submitted: Vyvgart (Efgartigimod alfa) [id number] Route of submission (phone, fax, portal): portal   Authorization has been DENIED because Your plan only covers this drug if electrodiagnostic studies were done and confirm your condition. We denied your request because we do not have all the information needed.

## 2023-12-29 ENCOUNTER — Ambulatory Visit (INDEPENDENT_AMBULATORY_CARE_PROVIDER_SITE_OTHER): Payer: 59 | Admitting: Neurology

## 2023-12-29 ENCOUNTER — Other Ambulatory Visit (HOSPITAL_COMMUNITY): Payer: Self-pay

## 2023-12-29 DIAGNOSIS — G6289 Other specified polyneuropathies: Secondary | ICD-10-CM

## 2023-12-29 DIAGNOSIS — E538 Deficiency of other specified B group vitamins: Secondary | ICD-10-CM

## 2023-12-29 DIAGNOSIS — G6181 Chronic inflammatory demyelinating polyneuritis: Secondary | ICD-10-CM

## 2023-12-29 MED ORDER — DULOXETINE HCL 30 MG PO CPEP
30.0000 mg | ORAL_CAPSULE | Freq: Every day | ORAL | 3 refills | Status: AC
  Filled 2023-12-29: qty 30, 30d supply, fill #0

## 2023-12-29 NOTE — Procedures (Signed)
[facility]  [address] [place], [address]  [place], [place] [address] Tel: [phone] Fax: [phone] Test Date:  12/29/2023  Patient: Christine Brady DOB: [birth year] Physician: Nita Sickle, DO  Sex: Female Height: 5\' 2"  Ref Phys: Nita Sickle, DO  ID#: [id number]   Technician:    History: This is a 63 year old female with history of acute inflammatory demyelinating polyradiculoneuropathy referred for evaluation of ongoing feet numbness.  NCV & EMG Findings: Extensive electrodiagnostic testing of the right upper and lower extremity shows: All sensory responses including right median, ulnar, radial, and mixed palmar sensory responses are within normal limits. All motor responses including right median, ulnar, peroneal, and tibial nerves are within normal limits. Tibial H reflex and F waves studies are within normal limits. There is no evidence of active or chronic motor axonal loss changes affecting any of the tested muscles.  Motor unit configuration and recruitment pattern is within normal limits.  Impression: This is a normal study of the right upper and lower extremities.  In particular, there is no evidence of a large fiber sensorimotor polyneuropathy, cervical/lumbosacral radiculopathy, or carpal tunnel syndrome.   ___________________________ Nita Sickle, DO    Nerve Conduction Studies   Stim Site NR Peak (ms) Norm Peak (ms) O-P Amp (V) Norm O-P Amp  Right Median Anti Sensory (2nd Digit)  32 C  Wrist    2.9 <3.8 37.7 >10  Right Radial Anti Sensory (Base 1st Digit)  32 C  Wrist    2.3 <2.8 21.3 >10  Right Sup Peroneal Anti Sensory (Ant Lat Mall)  32 C  12 cm    2.2 <4.6 18.4 >3  Right Sural Anti Sensory (Lat Mall)  32 C  Calf    2.4 <4.6 21.3 >3  Right Ulnar Anti Sensory (5th Digit)  32 C  Wrist    2.3 <3.2 33.7 >5     Stim Site NR Onset (ms) Norm Onset (ms) O-P Amp (mV) Norm O-P Amp Site1 Site2 Delta-0 (ms) Dist (cm) Vel (m/s) Norm Vel (m/s)   Right Median Motor (Abd Poll Brev)  32 C  Wrist    2.7 <4.0 9.7 >5 Elbow Wrist 4.6 26.0 57 >50  Elbow    7.3  9.7         Right Peroneal Motor (Ext Dig Brev)  32 C  Ankle    3.8 <6.0 2.6 >2.5 B Fib Ankle 6.4 35.0 55 >40  B Fib    10.2  2.5  Poplt B Fib 1.7 9.0 53 >40  Poplt    11.9  2.5         Right Tibial Motor (Abd Hall Brev)  32 C  Ankle    3.7 <6.0 13.1 >4 Knee Ankle 7.3 35.0 48 >40  Knee    11.0  8.7         Right Ulnar Motor (Abd Dig Minimi)  32 C  Wrist    2.3 <3.1 11.4 >7 B Elbow Wrist 3.1 19.0 61 >50  B Elbow    5.4  10.9  A Elbow B Elbow 1.4 8.0 57 >50  A Elbow    6.8  10.7            Stim Site NR Peak (ms) Norm Peak (ms) P-T Amp (V) Site1 Site2 Delta-P (ms) Norm Delta (ms)  Right Median/Ulnar Palm Comparison (Wrist - 8cm)  32 C  Median Palm    1.5 <2.2 84.1 Median Palm Ulnar Palm 0.1   Ulnar [employer]  1.4 <2.2 36.0       Electromyography   Side Muscle Ins.Act Fibs Fasc Recrt Amp Dur Poly Activation Comment  Right 1stDorInt Nml Nml Nml Nml Nml Nml Nml Nml N/A  Right Abd Poll Brev Nml Nml Nml Nml Nml Nml Nml Nml N/A  Right PronatorTeres Nml Nml Nml Nml Nml Nml Nml Nml N/A  Right Biceps Nml Nml Nml Nml Nml Nml Nml Nml N/A  Right Triceps Nml Nml Nml Nml Nml Nml Nml Nml N/A  Right Deltoid Nml Nml Nml Nml Nml Nml Nml Nml N/A  Right AntTibialis Nml Nml Nml Nml Nml Nml Nml Nml N/A  Right Gastroc Nml Nml Nml Nml Nml Nml Nml Nml N/A  Right Flex Dig Long Nml Nml Nml Nml Nml Nml Nml Nml N/A  Right RectFemoris Nml Nml Nml Nml Nml Nml Nml Nml N/A  Right BicepsFemS Nml Nml Nml Nml Nml Nml Nml Nml N/A  Right GluteusMed Nml Nml Nml Nml Nml Nml Nml Nml N/A  Right Lumbo Parasp Low Nml Nml Nml Nml Nml Nml Nml Nml N/A      Waveforms:

## 2023-12-29 NOTE — Progress Notes (Signed)
Follow-up Visit   Date: 12/29/2023    Christine Brady MRN: [id number] DOB: May 07, 1961    Christine Brady is a 63 y.o. right-handed Caucasian female with  ITP, history of DVT, hyperlipidemia and GERD returning to the clinic for EDX of the legs.  The patient was accompanied to the clinic by self.   IMPRESSION/PLAN: Bilateral feet paresthesias.  No evidence of neuropathy on NCS/EMG which is normal.  In the past she has history of ascending paresthesias and weakness in 2023 improved with PLEX (CSF pritein 109, WBC 18) and again in 2024, which was treated with corticosteroids. Most of her paresthesias in the legs has improved, however she continues to have tingling/numbness in the feet.  Partly, this may be due to low vitamin B12.  Alternatively, I discussed doing skin biopsy for small fiber neuropathy, however, she does not have the typical symptomology for small fiber neuropathy.  She will think about this.  For pain, we can try Cymbalta 30mg  daily. With her normal NCS/EMG, there is no role for ongoing immunotherapy.    CASPR2 antibody testing can present with neuropathic pain.  She may have hyperexcitability (cramps).  No history of seizures, ataxia, recurrent fever, or psychiatric conditions.     Low vitamin B12.  Continue vitamin B12 injections at home.    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  CSF testing shows WBC 18 and protein 109.  Imaging of the lumbar spine suggesting clumping of there nerve roots on myelogram.  She was treated with plasmaphereisis due to concern of GBS.  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  IVIG was not an option due to hypercoagulability with ITP. Her symptoms improved over several months and she was able to walk independently  again.  Since this time, she has severe cramps in the legs which are bothersome at night time.  No history of seizures or altered mentation.   She was doing well until early November, when she began having sensation of "clumpy" in the feet and leg weakness.  She also has tingling sensation around the head and face.   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  She was discharged with instructions to take prednisone 1250 mg x 2 days.  A few days after stopping steroids, she reports having increased numbness in the feet and clumpying sensation in the feet. She also have tingling sensation around the face, ears, and scalp.  Weakness has improved.   UPDATE 12/29/2023:  She is here for EDX of the legs.  She continues to have pain in the feet and sensation as if there are rocks in her shoes.  Sometimes, the pain can be severe.  She would like to try alternative medication to gabapentin, such as duloxetine or amitriptyline.   Medications:  Current Outpatient Medications on File Prior to Visit  Medication Sig Dispense Refill   Calcium Carbonate-Vitamin D3 600-400 MG-UNIT TABS Take 2 tablets by mouth daily.     cyanocobalamin (VITAMIN B12) 1000 MCG/ML injection Inject 1 ml (1000 mcg) daily x 7 days, then weekly x 4 weeks, then monthly thereafter x 1 year. 10 mL 1   dicyclomine (BENTYL) 10 MG capsule Take 1 capsule (10 mg total) by mouth every 8 (  eight) hours as needed for spasms. Call for appointment please. 15 capsule 0   NEEDLE, DISP, 25 G 25G X 1-1/2" MISC Please dispense needles for B 12 injections. 50 each 0   omeprazole (PRILOSEC) 40 MG capsule Take 1 capsule (40 mg total) by mouth daily. 90 capsule 4   polyethylene glycol powder (GLYCOLAX/MIRALAX) 17 GM/SCOOP powder Take 17 g by mouth daily.     rosuvastatin (CRESTOR) 5 MG tablet Take 1 tablet (5 mg total) by mouth 2 (two) times a week. 36 tablet 4   SYRINGE-NEEDLE,  DISP, 3 ML 21G X 1" 3 ML MISC Please dispense syringes for B 12 injections. 50 each 0   No current facility-administered medications on file prior to visit.    Allergies:  Allergies  Allergen Reactions   Nsaids Other (See Comments)    The patient was told to avoid NSAID(s) at this time- is taking Eliquis   Oxycodone Other (See Comments)    "Skin crawling"   Influenza Vaccines Other (See Comments)    Gullian-Barre syndrome   Shingrix [Zoster Vac Recomb Adjuvanted] Other (See Comments)    Potentially cause of Guillain-Barre syndrome   Claritin-D [Loratadine-Pseudoephedrine Er] Rash   Codeine Rash   Hydrocodone-Acetaminophen Itching   Loratadine Rash   Lyrica [Pregabalin] Other (See Comments)    Caused the patient to feel "foggy-headed"    Vital Signs:  There were no vitals taken for this visit.  Neurological Exam: MENTAL STATUS including orientation to time, place, person, recent and remote memory, attention span and concentration, language, and fund of knowledge is normal.  Speech is not dysarthric.  CRANIAL NERVES:    Normal conjugate, extra-ocular eye movements in all directions of gaze.  No ptosis.  Face is symmetric.   MOTOR:  Motor strength is 5/5 in all extremities.  No atrophy, fasciculations or abnormal movements.  No pronator drift.  Tone is normal.    MSRs:  Reflexes are 2+/4 throughout.  SENSORY:  Intact to vibration throughout.  COORDINATION/GAIT:  Gait narrow based and stable.   Data: NCS/EMG of the right side 12/29/2023: This is a normal study of the right upper and lower extremities.  In particular, there is no evidence of a large fiber sensorimotor polyneuropathy, cervical/lumbosacral radiculopathy, or carpal tunnel syndrome.    Thank you for allowing me to participate in patient's care.  If I can answer any additional questions, I would be pleased to do so.    Sincerely,    Zali Kamaka K. Allena Katz, DO

## 2023-12-30 ENCOUNTER — Other Ambulatory Visit (HOSPITAL_COMMUNITY): Payer: Self-pay

## 2024-01-27 ENCOUNTER — Other Ambulatory Visit (HOSPITAL_COMMUNITY): Payer: Self-pay

## 2024-01-27 DIAGNOSIS — Z9621 Cochlear implant status: Secondary | ICD-10-CM | POA: Diagnosis not present

## 2024-01-27 DIAGNOSIS — Z9089 Acquired absence of other organs: Secondary | ICD-10-CM | POA: Diagnosis not present

## 2024-01-27 DIAGNOSIS — H9313 Tinnitus, bilateral: Secondary | ICD-10-CM | POA: Diagnosis not present

## 2024-01-27 DIAGNOSIS — H7312 Chronic myringitis, left ear: Secondary | ICD-10-CM | POA: Diagnosis not present

## 2024-01-27 DIAGNOSIS — H900 Conductive hearing loss, bilateral: Secondary | ICD-10-CM | POA: Diagnosis not present

## 2024-01-27 DIAGNOSIS — H6993 Unspecified Eustachian tube disorder, bilateral: Secondary | ICD-10-CM | POA: Diagnosis not present

## 2024-01-27 DIAGNOSIS — H7202 Central perforation of tympanic membrane, left ear: Secondary | ICD-10-CM | POA: Diagnosis not present

## 2024-01-27 MED ORDER — OFLOXACIN 0.3 % OT SOLN
3.0000 [drp] | Freq: Two times a day (BID) | OTIC | 1 refills | Status: DC
  Filled 2024-01-27: qty 5, 10d supply, fill #0

## 2024-01-31 ENCOUNTER — Inpatient Hospital Stay: Payer: 59 | Attending: Oncology

## 2024-01-31 ENCOUNTER — Inpatient Hospital Stay (HOSPITAL_BASED_OUTPATIENT_CLINIC_OR_DEPARTMENT_OTHER): Payer: 59 | Admitting: Oncology

## 2024-01-31 VITALS — BP 112/87 | HR 60 | Temp 98.1°F | Resp 18 | Ht 62.0 in | Wt 144.4 lb

## 2024-01-31 DIAGNOSIS — D693 Immune thrombocytopenic purpura: Secondary | ICD-10-CM

## 2024-01-31 LAB — CBC WITH DIFFERENTIAL (CANCER CENTER ONLY)
Abs Immature Granulocytes: 0.01 10*3/uL (ref 0.00–0.07)
Basophils Absolute: 0.1 10*3/uL (ref 0.0–0.1)
Basophils Relative: 1 %
Eosinophils Absolute: 0.1 10*3/uL (ref 0.0–0.5)
Eosinophils Relative: 1 %
HCT: 37.5 % (ref 36.0–46.0)
Hemoglobin: 12.6 g/dL (ref 12.0–15.0)
Immature Granulocytes: 0 %
Lymphocytes Relative: 37 %
Lymphs Abs: 1.6 10*3/uL (ref 0.7–4.0)
MCH: 32 pg (ref 26.0–34.0)
MCHC: 33.6 g/dL (ref 30.0–36.0)
MCV: 95.2 fL (ref 80.0–100.0)
Monocytes Absolute: 0.4 10*3/uL (ref 0.1–1.0)
Monocytes Relative: 9 %
Neutro Abs: 2.3 10*3/uL (ref 1.7–7.7)
Neutrophils Relative %: 52 %
Platelet Count: 112 10*3/uL — ABNORMAL LOW (ref 150–400)
RBC: 3.94 MIL/uL (ref 3.87–5.11)
RDW: 12.5 % (ref 11.5–15.5)
WBC Count: 4.4 10*3/uL (ref 4.0–10.5)
nRBC: 0 % (ref 0.0–0.2)

## 2024-01-31 NOTE — Progress Notes (Signed)
  [facility] OFFICE PROGRESS NOTE   Diagnosis: ITP  INTERVAL HISTORY:   [name] was admitted 10/31/2023 with bilateral foot numbness.  She was treated with a course of high-dose steroids.  She reports no improvement in the foot numbness.  She continues to have numbness at the soles of the feet.  No fever or night sweats.  No other complaint.  She is taking monthly vitamin B12 injection therapy.    Objective:  Vital signs in last 24 hours:  Blood pressure 112/87, pulse 60, temperature 98.1 F (36.7 C), temperature source Temporal, resp. rate 18, height 5\' 2"  (1.575 m), weight 144 lb 6.4 oz (65.5 kg), SpO2 100%.    Lymphatics: No cervical, supraclavicular, axillary, or inguinal nodes Resp: Lungs clear bilaterally Cardio: Regular rate and rhythm GI: No hepatosplenomegaly Vascular: No leg edema Neuro: The motor exam appears intact in the legs and feet bilaterally    Lab Results:  Lab Results  Component Value Date   WBC 4.4 01/31/2024   HGB 12.6 01/31/2024   HCT 37.5 01/31/2024   MCV 95.2 01/31/2024   PLT 112 (L) 01/31/2024   NEUTROABS 2.3 01/31/2024    CMP  Lab Results  Component Value Date   NA 135 11/03/2023   K 4.2 11/03/2023   CL 104 11/03/2023   CO2 23 11/03/2023   GLUCOSE 144 (H) 11/03/2023   BUN 13 11/03/2023   CREATININE 0.60 11/03/2023   CALCIUM 9.3 11/03/2023   PROT 6.2 (L) 11/02/2023   ALBUMIN 3.6 11/02/2023   AST 15 11/02/2023   ALT 11 11/02/2023   ALKPHOS 38 11/02/2023   BILITOT 0.8 11/02/2023   GFRNONAA >60 11/03/2023   GFRAA >60 07/12/2019    Medications: I have reviewed the patient's current medications.   Assessment/Plan: Left peroneal DVT 12/09/2021-apixaban Apixaban discontinued approximately 06/27/2022  Chronic ITP  3.   Right knee arthroscopy with partial lateral meniscectomy and removal of loose body, open patellofemoral ligament reconstruction 10/05/2021  4.   History of fine "petechial "rash at the pretibial  area bilaterally  5.   Right lower back pain  6.   Family history of pulmonary fibrosis  7.   Family history of SLE/Sjogren's  8.  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  Family history of ovarian cancer  10.  Elevated liver enzyme  11.  Hearing loss  12.  Vitamin B12 deficiency diagnosed January 2023  13.  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 has chronic ITP.  She has chronic mild thrombocytopenia.  The platelet count is stable.  She will continue follow-up with neurology for evaluation and treatment of the neurologic syndrome she will return for an office and lab visit in 1 year.  Thornton Papas, MD  01/31/2024  12:03 PM

## 2024-02-01 ENCOUNTER — Ambulatory Visit: Payer: 59 | Admitting: Neurology

## 2024-02-22 ENCOUNTER — Other Ambulatory Visit: Payer: Self-pay | Admitting: Neurology

## 2024-02-22 ENCOUNTER — Telehealth: Payer: Self-pay | Admitting: Neurology

## 2024-02-22 ENCOUNTER — Ambulatory Visit: Payer: Commercial Managed Care - PPO | Admitting: Neurology

## 2024-02-22 DIAGNOSIS — G6181 Chronic inflammatory demyelinating polyneuritis: Secondary | ICD-10-CM

## 2024-02-22 NOTE — Telephone Encounter (Signed)
Today's appointment has been canceled.

## 2024-02-22 NOTE — Telephone Encounter (Signed)
 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  She has a neuromuscular specialist Dr. Allena Katz and they discussed medications for CIDP.  She was found to have B12 deficiency and treated.  At this time I discussed with patient going to [facility] neuromuscular team.  Sent referral.  If the wait is too long we can refer to [facility] instead.  Will cancel appointment today at [facility].  Patient is aware we will cancel appointment and refer to [facility].  POD 4 can you please cancel her appointment today and block thank you.

## 2024-02-22 NOTE — Telephone Encounter (Signed)
 Neurology referral faxed to [facility] (fax# [phone], phone# [phone])

## 2024-03-14 ENCOUNTER — Ambulatory Visit: Payer: Commercial Managed Care - PPO | Admitting: Neurology

## 2024-05-23 DIAGNOSIS — L570 Actinic keratosis: Secondary | ICD-10-CM | POA: Diagnosis not present

## 2024-05-23 DIAGNOSIS — L820 Inflamed seborrheic keratosis: Secondary | ICD-10-CM | POA: Diagnosis not present

## 2024-07-09 ENCOUNTER — Other Ambulatory Visit (HOSPITAL_COMMUNITY): Payer: Self-pay

## 2024-07-09 MED ORDER — ROSUVASTATIN CALCIUM 5 MG PO TABS
ORAL_TABLET | ORAL | 0 refills | Status: AC
  Filled 2024-07-09: qty 6, 21d supply, fill #0

## 2024-07-09 MED ORDER — OMEPRAZOLE 40 MG PO CPDR
40.0000 mg | DELAYED_RELEASE_CAPSULE | Freq: Every day | ORAL | 0 refills | Status: AC
  Filled 2024-07-09: qty 30, 30d supply, fill #0

## 2024-07-20 ENCOUNTER — Other Ambulatory Visit: Payer: Self-pay

## 2024-07-20 ENCOUNTER — Other Ambulatory Visit (HOSPITAL_COMMUNITY): Payer: Self-pay

## 2024-07-20 DIAGNOSIS — Z6825 Body mass index (BMI) 25.0-25.9, adult: Secondary | ICD-10-CM | POA: Diagnosis not present

## 2024-07-20 DIAGNOSIS — Z1339 Encounter for screening examination for other mental health and behavioral disorders: Secondary | ICD-10-CM | POA: Diagnosis not present

## 2024-07-20 DIAGNOSIS — Z0000 Encounter for general adult medical examination without abnormal findings: Secondary | ICD-10-CM | POA: Diagnosis not present

## 2024-07-20 DIAGNOSIS — E538 Deficiency of other specified B group vitamins: Secondary | ICD-10-CM | POA: Diagnosis not present

## 2024-07-20 DIAGNOSIS — Z13220 Encounter for screening for lipoid disorders: Secondary | ICD-10-CM | POA: Diagnosis not present

## 2024-07-20 DIAGNOSIS — Z1331 Encounter for screening for depression: Secondary | ICD-10-CM | POA: Diagnosis not present

## 2024-07-20 MED ORDER — POLYETHYLENE GLYCOL 3350 17 GM/SCOOP PO POWD
17.0000 g | Freq: Every day | ORAL | 4 refills | Status: AC
  Filled 2024-07-20: qty 1428, 84d supply, fill #0

## 2024-07-20 MED ORDER — OMEPRAZOLE 40 MG PO CPDR
40.0000 mg | DELAYED_RELEASE_CAPSULE | Freq: Every day | ORAL | 3 refills | Status: AC
  Filled 2024-07-20: qty 30, 30d supply, fill #0
  Filled 2024-07-20: qty 90, 90d supply, fill #0
  Filled 2024-10-19: qty 30, 30d supply, fill #1
  Filled 2024-11-27: qty 30, 30d supply, fill #2
  Filled 2025-01-01: qty 90, 90d supply, fill #3

## 2024-07-20 MED ORDER — ROSUVASTATIN CALCIUM 5 MG PO TABS
5.0000 mg | ORAL_TABLET | ORAL | 3 refills | Status: AC
  Filled 2024-07-20 – 2024-07-25 (×2): qty 24, 84d supply, fill #0
  Filled 2024-10-19: qty 24, 84d supply, fill #1
  Filled 2025-01-06 (×2): qty 24, 84d supply, fill #2

## 2024-07-25 ENCOUNTER — Other Ambulatory Visit (HOSPITAL_COMMUNITY): Payer: Self-pay

## 2024-07-27 ENCOUNTER — Other Ambulatory Visit (HOSPITAL_COMMUNITY): Payer: Self-pay

## 2024-07-27 DIAGNOSIS — Z9621 Cochlear implant status: Secondary | ICD-10-CM | POA: Diagnosis not present

## 2024-07-27 DIAGNOSIS — Z9089 Acquired absence of other organs: Secondary | ICD-10-CM | POA: Diagnosis not present

## 2024-07-27 DIAGNOSIS — H7411 Adhesive right middle ear disease: Secondary | ICD-10-CM | POA: Diagnosis not present

## 2024-07-27 DIAGNOSIS — H9313 Tinnitus, bilateral: Secondary | ICD-10-CM | POA: Diagnosis not present

## 2024-07-27 DIAGNOSIS — H900 Conductive hearing loss, bilateral: Secondary | ICD-10-CM | POA: Diagnosis not present

## 2024-07-27 DIAGNOSIS — H93222 Diplacusis, left ear: Secondary | ICD-10-CM | POA: Diagnosis not present

## 2024-07-27 DIAGNOSIS — Z9889 Other specified postprocedural states: Secondary | ICD-10-CM | POA: Diagnosis not present

## 2024-07-27 DIAGNOSIS — H7202 Central perforation of tympanic membrane, left ear: Secondary | ICD-10-CM | POA: Diagnosis not present

## 2024-07-27 DIAGNOSIS — H6993 Unspecified Eustachian tube disorder, bilateral: Secondary | ICD-10-CM | POA: Diagnosis not present

## 2024-07-27 DIAGNOSIS — H93212 Auditory recruitment, left ear: Secondary | ICD-10-CM | POA: Diagnosis not present

## 2024-09-14 ENCOUNTER — Other Ambulatory Visit: Payer: Self-pay | Admitting: Obstetrics and Gynecology

## 2024-09-14 DIAGNOSIS — Z1231 Encounter for screening mammogram for malignant neoplasm of breast: Secondary | ICD-10-CM

## 2024-09-26 DIAGNOSIS — G629 Polyneuropathy, unspecified: Secondary | ICD-10-CM | POA: Diagnosis not present

## 2024-09-26 DIAGNOSIS — G7119 Other specified myotonic disorders: Secondary | ICD-10-CM | POA: Diagnosis not present

## 2024-10-19 ENCOUNTER — Other Ambulatory Visit (HOSPITAL_COMMUNITY): Payer: Self-pay

## 2024-10-30 DIAGNOSIS — L578 Other skin changes due to chronic exposure to nonionizing radiation: Secondary | ICD-10-CM | POA: Diagnosis not present

## 2024-10-30 DIAGNOSIS — D2272 Melanocytic nevi of left lower limb, including hip: Secondary | ICD-10-CM | POA: Diagnosis not present

## 2024-10-30 DIAGNOSIS — L814 Other melanin hyperpigmentation: Secondary | ICD-10-CM | POA: Diagnosis not present

## 2024-10-30 DIAGNOSIS — L821 Other seborrheic keratosis: Secondary | ICD-10-CM | POA: Diagnosis not present

## 2024-10-30 DIAGNOSIS — Z85828 Personal history of other malignant neoplasm of skin: Secondary | ICD-10-CM | POA: Diagnosis not present

## 2024-10-30 DIAGNOSIS — D225 Melanocytic nevi of trunk: Secondary | ICD-10-CM | POA: Diagnosis not present

## 2024-11-12 ENCOUNTER — Ambulatory Visit

## 2024-12-19 ENCOUNTER — Telehealth: Payer: Self-pay

## 2024-12-19 ENCOUNTER — Encounter: Payer: Self-pay | Admitting: Gastroenterology

## 2024-12-19 NOTE — Telephone Encounter (Signed)
 Attempted to reach patient concerning colonoscopy recall; unable to speak with patient;  left message and number to the office for patient to call back and schedule appts;

## 2024-12-26 ENCOUNTER — Ambulatory Visit: Admitting: *Deleted

## 2024-12-26 ENCOUNTER — Other Ambulatory Visit (HOSPITAL_COMMUNITY): Payer: Self-pay

## 2024-12-26 VITALS — Ht 62.0 in | Wt 145.0 lb

## 2024-12-26 DIAGNOSIS — K589 Irritable bowel syndrome without diarrhea: Secondary | ICD-10-CM

## 2024-12-26 DIAGNOSIS — K562 Volvulus: Secondary | ICD-10-CM

## 2024-12-26 MED ORDER — NA SULFATE-K SULFATE-MG SULF 17.5-3.13-1.6 GM/177ML PO SOLN
1.0000 | Freq: Once | ORAL | 0 refills | Status: AC
  Filled 2024-12-26: qty 354, 1d supply, fill #0

## 2024-12-26 NOTE — Progress Notes (Signed)
 Pt's name and DOB verified at the beginning of the pre-visit with 2 identifiers  Permission given to speak with  Pt denies any difficulty with ambulating,sitting, laying down or rolling side to side  Pt has no issues moving head neck or swallowing  HX of PONV  No issues known to pt with past sedation  No FH of Malignant Hyperthermia  Pt is not on home 02   Pt is not on blood thinners   Pt denies issues with constipation  Pt has frequent issues with constipation RN instructed pt to use Miralax  per bottles instructions a week before prep days. Pt states they will  Pt is not on dialysis  Pt denise any abnormal heart rhythms   Pt denies any upcoming cardiac testing  Patient's chart reviewed by Norleen Schillings CNRA prior to pre-visit and patient appropriate for the [facility].  Pre-visit completed and red dot placed by patient's name on their procedure day (on provider's schedule).    Visit in person  Pt states weight is  Pt given  both [facility] main # and MD on call # prior to instructions.  Informed pt to come in at the time discussed and is shown on PV instructions.  Pt instructed to use [employer] or GoodRx for a price reduction on prep  Instructed pt where to find PV instructions in My Chart and a copy given to pt. Instructed pt on all aspects of written instructions including med holds clothing to wear and foods to eat and not eat as well as after procedure legal restrictions and to call MD on call if needed.. Pt states understanding. Instructed pt to review instructions again prior to procedure and call main # given if has any questions or any issues. Pt states they will.

## 2024-12-27 ENCOUNTER — Other Ambulatory Visit (HOSPITAL_COMMUNITY): Payer: Self-pay

## 2024-12-31 ENCOUNTER — Other Ambulatory Visit (HOSPITAL_COMMUNITY): Payer: Self-pay

## 2025-01-01 ENCOUNTER — Encounter: Payer: Self-pay | Admitting: Gastroenterology

## 2025-01-01 ENCOUNTER — Other Ambulatory Visit (HOSPITAL_COMMUNITY): Payer: Self-pay

## 2025-01-06 ENCOUNTER — Other Ambulatory Visit (HOSPITAL_COMMUNITY): Payer: Self-pay

## 2025-01-08 ENCOUNTER — Encounter: Admitting: Gastroenterology

## 2025-01-11 ENCOUNTER — Encounter: Payer: Self-pay | Admitting: Gastroenterology

## 2025-01-11 ENCOUNTER — Telehealth: Payer: Self-pay | Admitting: Gastroenterology

## 2025-01-11 NOTE — Telephone Encounter (Signed)
 ok

## 2025-01-11 NOTE — Telephone Encounter (Signed)
 Incoming call from pt rescheduling colonoscopy from 02/05.  Pt rescheduled for 03/08/2025 @ 10:00am. Pt in need of updated prep instructions. Please advise. Thank you.

## 2025-01-11 NOTE — Telephone Encounter (Signed)
 Updated Prep instructions have been sent via mychart.

## 2025-01-15 ENCOUNTER — Ambulatory Visit

## 2025-01-17 ENCOUNTER — Encounter: Admitting: Gastroenterology

## 2025-01-31 ENCOUNTER — Ambulatory Visit: Payer: 59 | Admitting: Oncology

## 2025-01-31 ENCOUNTER — Other Ambulatory Visit: Payer: 59

## 2025-02-12 ENCOUNTER — Ambulatory Visit

## 2025-03-08 ENCOUNTER — Encounter: Admitting: Gastroenterology
